# Patient Record
Sex: Female | Born: 1950 | Race: Black or African American | Hispanic: No | Marital: Single | Smoking: Former smoker
Health system: Southern US, Community
[De-identification: ages and names within clinical notes are randomized; demographics above are authoritative.]

## PROBLEM LIST (undated history)

## (undated) DIAGNOSIS — I10 Essential (primary) hypertension: Secondary | ICD-10-CM

## (undated) DIAGNOSIS — M109 Gout, unspecified: Secondary | ICD-10-CM

## (undated) DIAGNOSIS — J302 Other seasonal allergic rhinitis: Secondary | ICD-10-CM

## (undated) DIAGNOSIS — D649 Anemia, unspecified: Secondary | ICD-10-CM

## (undated) DIAGNOSIS — I700 Atherosclerosis of aorta: Secondary | ICD-10-CM

## (undated) DIAGNOSIS — K219 Gastro-esophageal reflux disease without esophagitis: Secondary | ICD-10-CM

## (undated) DIAGNOSIS — M1990 Unspecified osteoarthritis, unspecified site: Secondary | ICD-10-CM

## (undated) DIAGNOSIS — Z992 Dependence on renal dialysis: Secondary | ICD-10-CM

## (undated) DIAGNOSIS — J189 Pneumonia, unspecified organism: Secondary | ICD-10-CM

## (undated) DIAGNOSIS — R011 Cardiac murmur, unspecified: Secondary | ICD-10-CM

## (undated) DIAGNOSIS — I2510 Atherosclerotic heart disease of native coronary artery without angina pectoris: Secondary | ICD-10-CM

## (undated) DIAGNOSIS — N186 End stage renal disease: Secondary | ICD-10-CM

## (undated) DIAGNOSIS — G709 Myoneural disorder, unspecified: Secondary | ICD-10-CM

## (undated) DIAGNOSIS — N289 Disorder of kidney and ureter, unspecified: Secondary | ICD-10-CM

## (undated) DIAGNOSIS — I4891 Unspecified atrial fibrillation: Secondary | ICD-10-CM

## (undated) DIAGNOSIS — E213 Hyperparathyroidism, unspecified: Secondary | ICD-10-CM

## (undated) HISTORY — DX: End stage renal disease: N18.6

## (undated) HISTORY — DX: Unspecified atrial fibrillation: I48.91

## (undated) HISTORY — DX: Other seasonal allergic rhinitis: J30.2

## (undated) HISTORY — DX: Atherosclerosis of aorta: I70.0

## (undated) HISTORY — PX: DIALYSIS FISTULA CREATION: SHX611

## (undated) HISTORY — DX: Cardiac murmur, unspecified: R01.1

## (undated) HISTORY — DX: Hyperparathyroidism, unspecified: E21.3

## (undated) HISTORY — DX: Atherosclerotic heart disease of native coronary artery without angina pectoris: I25.10

## (undated) HISTORY — PX: TUBAL LIGATION: SHX77

---

## 2019-03-31 DIAGNOSIS — E079 Disorder of thyroid, unspecified: Secondary | ICD-10-CM | POA: Insufficient documentation

## 2019-03-31 DIAGNOSIS — R0602 Shortness of breath: Secondary | ICD-10-CM | POA: Insufficient documentation

## 2019-03-31 DIAGNOSIS — E878 Other disorders of electrolyte and fluid balance, not elsewhere classified: Secondary | ICD-10-CM | POA: Insufficient documentation

## 2019-03-31 DIAGNOSIS — D069 Carcinoma in situ of cervix, unspecified: Secondary | ICD-10-CM | POA: Insufficient documentation

## 2019-03-31 DIAGNOSIS — N250 Renal osteodystrophy: Secondary | ICD-10-CM | POA: Insufficient documentation

## 2019-03-31 DIAGNOSIS — K30 Functional dyspepsia: Secondary | ICD-10-CM | POA: Insufficient documentation

## 2019-03-31 DIAGNOSIS — D631 Anemia in chronic kidney disease: Secondary | ICD-10-CM | POA: Insufficient documentation

## 2019-03-31 DIAGNOSIS — Q612 Polycystic kidney, adult type: Secondary | ICD-10-CM | POA: Insufficient documentation

## 2019-03-31 DIAGNOSIS — D689 Coagulation defect, unspecified: Secondary | ICD-10-CM | POA: Insufficient documentation

## 2019-03-31 DIAGNOSIS — D509 Iron deficiency anemia, unspecified: Secondary | ICD-10-CM | POA: Insufficient documentation

## 2019-03-31 DIAGNOSIS — M109 Gout, unspecified: Secondary | ICD-10-CM | POA: Insufficient documentation

## 2019-05-09 DIAGNOSIS — N2581 Secondary hyperparathyroidism of renal origin: Secondary | ICD-10-CM | POA: Insufficient documentation

## 2019-05-15 DIAGNOSIS — U071 COVID-19: Secondary | ICD-10-CM

## 2019-05-15 HISTORY — DX: COVID-19: U07.1

## 2019-08-27 DIAGNOSIS — R52 Pain, unspecified: Secondary | ICD-10-CM | POA: Insufficient documentation

## 2019-11-13 DIAGNOSIS — T782XXA Anaphylactic shock, unspecified, initial encounter: Secondary | ICD-10-CM | POA: Insufficient documentation

## 2019-11-13 DIAGNOSIS — T7840XA Allergy, unspecified, initial encounter: Secondary | ICD-10-CM | POA: Insufficient documentation

## 2019-11-18 DIAGNOSIS — E8809 Other disorders of plasma-protein metabolism, not elsewhere classified: Secondary | ICD-10-CM | POA: Insufficient documentation

## 2020-01-12 DIAGNOSIS — E8770 Fluid overload, unspecified: Secondary | ICD-10-CM | POA: Insufficient documentation

## 2020-02-11 DIAGNOSIS — Z992 Dependence on renal dialysis: Secondary | ICD-10-CM | POA: Insufficient documentation

## 2020-12-29 ENCOUNTER — Encounter (HOSPITAL_BASED_OUTPATIENT_CLINIC_OR_DEPARTMENT_OTHER): Payer: Self-pay | Admitting: Emergency Medicine

## 2020-12-29 ENCOUNTER — Other Ambulatory Visit: Payer: Self-pay

## 2020-12-29 ENCOUNTER — Emergency Department (HOSPITAL_BASED_OUTPATIENT_CLINIC_OR_DEPARTMENT_OTHER): Payer: Medicare Other

## 2020-12-29 ENCOUNTER — Emergency Department (HOSPITAL_BASED_OUTPATIENT_CLINIC_OR_DEPARTMENT_OTHER)
Admission: EM | Admit: 2020-12-29 | Discharge: 2020-12-29 | Disposition: A | Discharge status: Home / Self Care | Payer: Medicare Other | Attending: Emergency Medicine | Admitting: Emergency Medicine

## 2020-12-29 DIAGNOSIS — M79674 Pain in right toe(s): Secondary | ICD-10-CM | POA: Diagnosis not present

## 2020-12-29 HISTORY — DX: Dependence on renal dialysis: Z99.2

## 2020-12-29 IMAGING — DX DG FOOT COMPLETE 3+V*R*
2 series · 3 of 3 positions shown · non-contrast
Comparison: None.

CLINICAL DATA: Great toe pain.  No known injury.

EXAM:
RIGHT FOOT COMPLETE - 3+ VIEW

[Series 1: foot · 0.14mm/px · 2 of 2 slices shown]
[im 1/2]
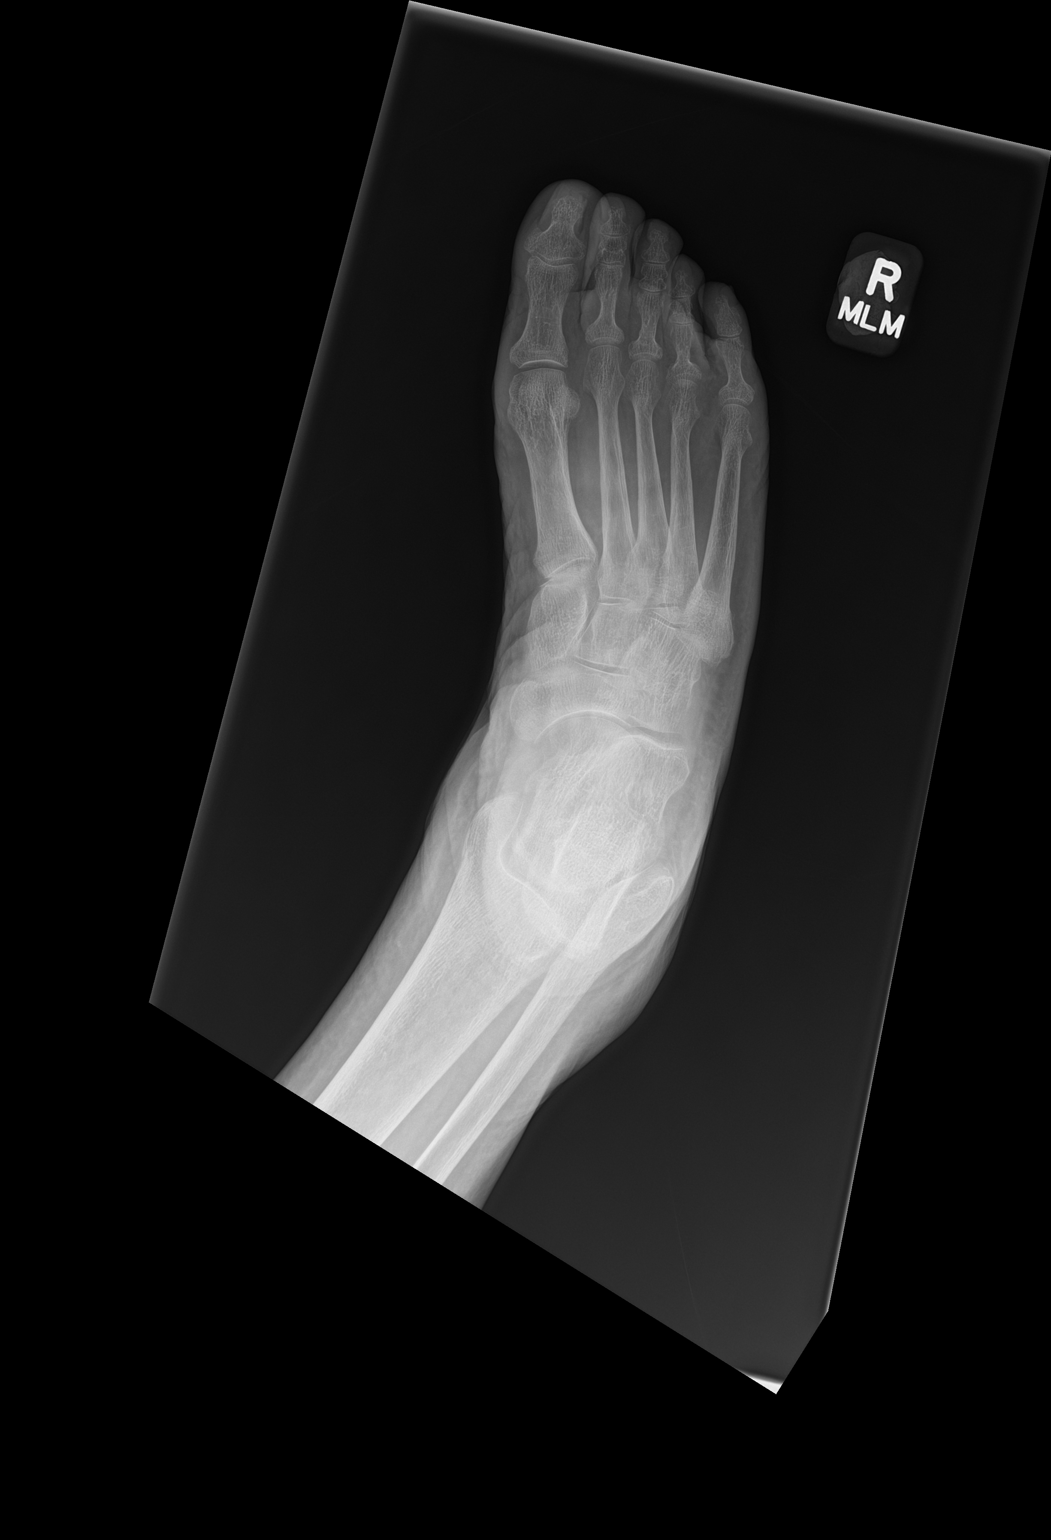
[im 2/2]
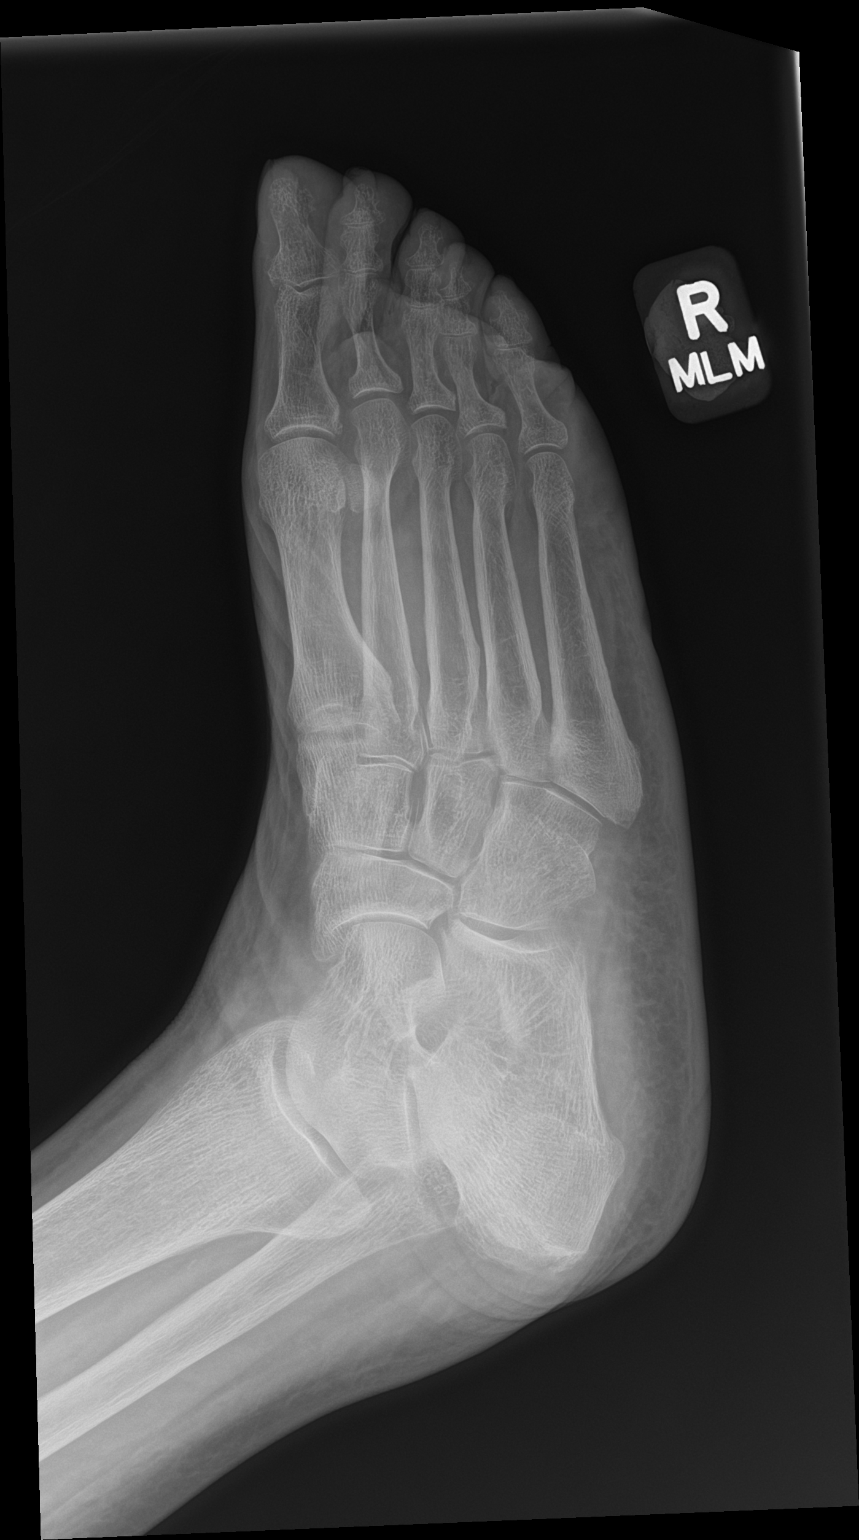

[leg]
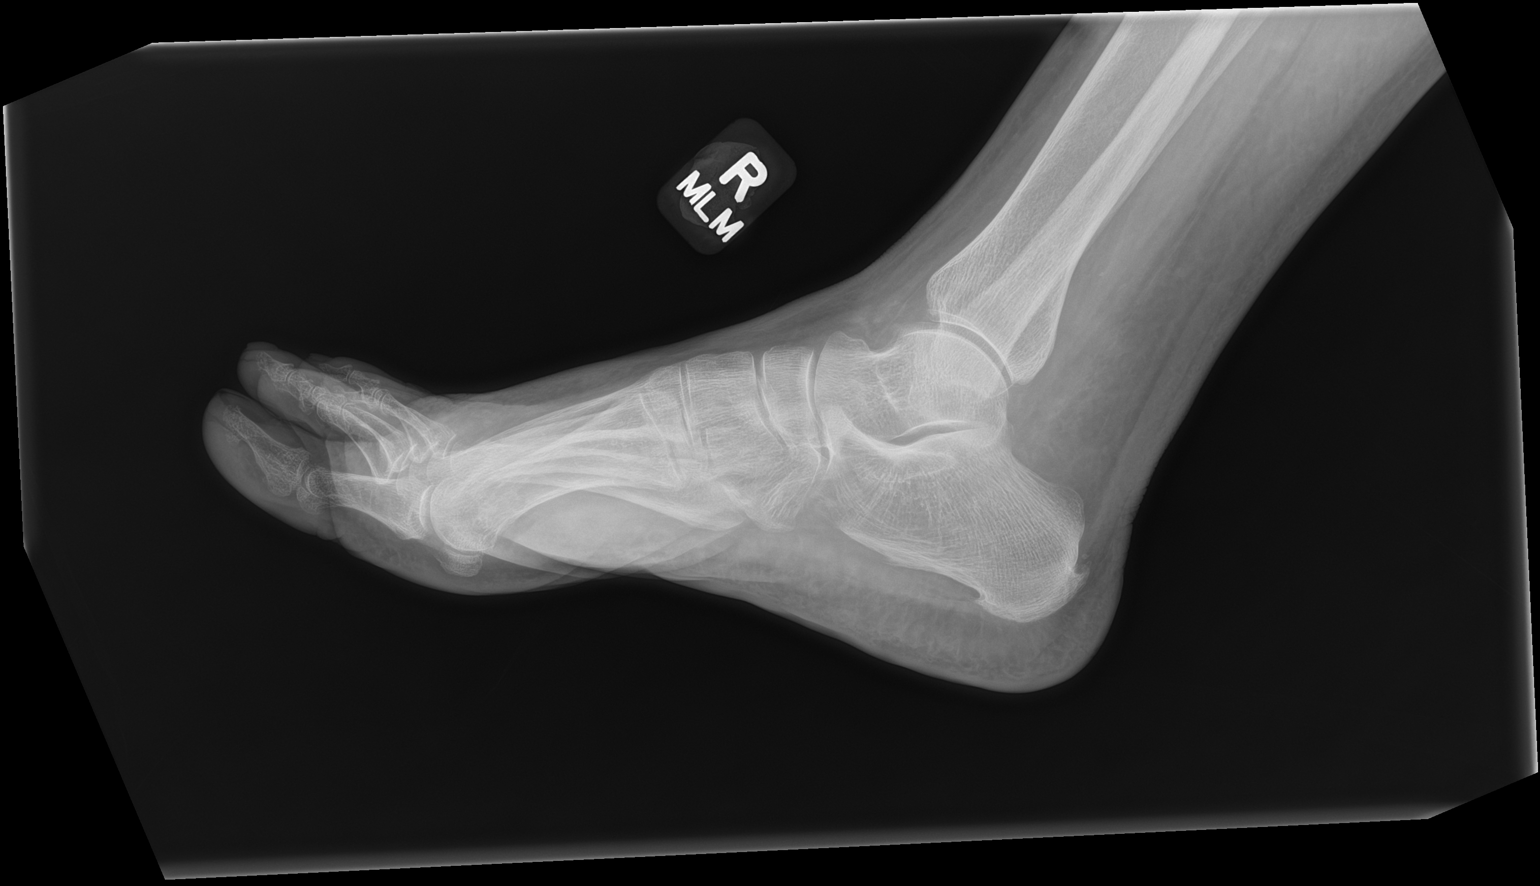

[3 of 3 positions shown; findings below may reference images not displayed]

FINDINGS: Bones are mildly demineralized. There is no evidence of fracture or
dislocation. Small bidirectional calcaneal enthesophytes. There is
no evidence of arthropathy or other focal bone abnormality. Soft
tissues are unremarkable.
IMPRESSION: Negative.

## 2020-12-29 MED ORDER — PREDNISONE 20 MG PO TABS: 40.0000 mg | ORAL_TABLET | Freq: Every day | ORAL | 0 refills | Status: DC

## 2020-12-29 NOTE — ED Provider Notes (Signed)
[place] EMERGENCY DEPT Provider Note   CSN: [id number] Arrival date & time: 12/29/20  1121     History Chief Complaint  Patient presents with   Joint Swelling    Gloria Gallagher is a 70 y.o. female.  She is here with complaint of pain and swelling to her right great toe.  Its been going on a week but today she said it actually feels a little bit better.  She does not recall any trauma.  No prior history of gout.  She is end-stage renal disease on dialysis.  No diabetes.  The history is provided by the patient.  Toe Pain This is a new problem. The current episode started more than 1 week ago. The problem occurs constantly. The problem has been gradually improving. Pertinent negatives include no chest pain, no abdominal pain, no headaches and no shortness of breath. The symptoms are aggravated by bending and walking. Nothing relieves the symptoms. She has tried rest for the symptoms. The treatment provided mild relief.      Past Medical History:  Diagnosis Date   Dialysis patient [facility])     There are no problems to display for this patient.   History reviewed. No pertinent surgical history.   OB History   No obstetric history on file.     History reviewed. No pertinent family history.  Social History   Tobacco Use   Smoking status: Never   Smokeless tobacco: Never  Vaping Use   Vaping Use: Never used  Substance Use Topics   Alcohol use: Never   Drug use: Never    Home Medications Prior to Admission medications   Not on File    Allergies    Penicillin g  Review of Systems   Review of Systems  Constitutional:  Negative for fever.  Respiratory:  Negative for shortness of breath.   Cardiovascular:  Negative for chest pain.  Gastrointestinal:  Negative for abdominal pain.  Skin:  Negative for wound.  Neurological:  Negative for headaches.   Physical Exam Updated Vital Signs BP 105/73 (BP Location: Right Arm)   Pulse 82   Temp 98.3 F  (36.8 C) (Oral)   Resp 18   Ht '5\' 5"'$  (1.651 m)   Wt 77.1 kg   SpO2 96%   BMI 28.29 kg/m   Physical Exam Constitutional:      Appearance: Normal appearance. She is well-developed.  HENT:     Head: Normocephalic and atraumatic.  Eyes:     Conjunctiva/sclera: Conjunctivae normal.  Musculoskeletal:        General: Tenderness present.     Cervical back: Neck supple.     Comments: She is some tenderness and redness at the base of her great toe on the right foot.  Minimal swelling.  No open wounds.  Cap refill brisk.  Ankle nontender.  No cords appreciated  Skin:    General: Skin is warm and dry.  Neurological:     General: No focal deficit present.     Mental Status: She is alert.     GCS: GCS eye subscore is 4. GCS verbal subscore is 5. GCS motor subscore is 6.    ED Results / Procedures / Treatments   Labs (all labs ordered are listed, but only abnormal results are displayed) Labs Reviewed - No data to display  EKG None  Radiology DG Foot Complete Right  Result Date: 12/29/2020 CLINICAL DATA:  Great toe pain.  No known injury. EXAM: RIGHT FOOT COMPLETE -  3+ VIEW COMPARISON:  None. FINDINGS: Bones are mildly demineralized. There is no evidence of fracture or dislocation. Small bidirectional calcaneal enthesophytes. There is no evidence of arthropathy or other focal bone abnormality. Soft tissues are unremarkable. IMPRESSION: Negative. Electronically Signed   By: Davina Poke D.O.   On: 12/29/2020 12:16    Procedures Procedures   Medications Ordered in ED Medications - No data to display  ED Course  I have reviewed the triage vital signs and the nursing notes.  Pertinent labs & imaging results that were available during my care of the patient were reviewed by me and considered in my medical decision making (see chart for details).  Clinical Course as of 12/29/20 1736  Thu Dec 29, 2020  1204 X-ray of right foot does not show any acute fractures or destructive bony  lesions.  Awaiting radiology reading. [MB]    Clinical Course User Index [MB] Hayden Rasmussen, MD   MDM Rules/Calculators/A&P                           70 year old female end-stage renal disease here with atraumatic right great toe pain.  Has minimal tenderness and swelling on exam.  No indication that infected.  No open wounds.  X-rays do not show any acute fracture.  Not candidate for NSAIDs with her kidney disease.  We will provide short course of steroids for likely gout flare.  Recommended close PCP follow-up.  Return instructions discussed Final Clinical Impression(s) / ED Diagnoses Final diagnoses:  Great toe pain, right    Rx / DC Orders ED Discharge Orders          Ordered    predniSONE (DELTASONE) 20 MG tablet  Daily        12/29/20 1220             Hayden Rasmussen, MD 12/29/20 1737

## 2020-12-29 NOTE — Discharge Instructions (Addendum)
You are seen in the emergency department for evaluation of right great toe pain.  You had an x-ray done that did not show any obvious fracture.  This is most likely gout.  It should respond to prednisone which is a steroid.  Please follow-up with your regular doctor.  Return to the emergency department for any worsening or concerning symptoms.

## 2020-12-29 NOTE — ED Notes (Signed)
Dc instructions reviewed with patient and voiced understanding of dc instructions and prescription

## 2020-12-29 NOTE — ED Triage Notes (Signed)
Swelling to right ankle.  Reports painful at night.

## 2021-02-02 ENCOUNTER — Emergency Department (HOSPITAL_BASED_OUTPATIENT_CLINIC_OR_DEPARTMENT_OTHER): Payer: Medicare Other | Admitting: Radiology

## 2021-02-02 ENCOUNTER — Emergency Department (HOSPITAL_BASED_OUTPATIENT_CLINIC_OR_DEPARTMENT_OTHER)
Admission: EM | Admit: 2021-02-02 | Discharge: 2021-02-02 | Disposition: A | Discharge status: Home / Self Care | Payer: Medicare Other | Attending: Emergency Medicine | Admitting: Emergency Medicine

## 2021-02-02 ENCOUNTER — Other Ambulatory Visit: Payer: Self-pay

## 2021-02-02 ENCOUNTER — Emergency Department (HOSPITAL_BASED_OUTPATIENT_CLINIC_OR_DEPARTMENT_OTHER): Payer: Medicare Other

## 2021-02-02 ENCOUNTER — Encounter (HOSPITAL_BASED_OUTPATIENT_CLINIC_OR_DEPARTMENT_OTHER): Payer: Self-pay | Admitting: Obstetrics and Gynecology

## 2021-02-02 DIAGNOSIS — Z992 Dependence on renal dialysis: Secondary | ICD-10-CM | POA: Diagnosis not present

## 2021-02-02 DIAGNOSIS — M79671 Pain in right foot: Secondary | ICD-10-CM | POA: Diagnosis not present

## 2021-02-02 HISTORY — DX: Disorder of kidney and ureter, unspecified: N28.9

## 2021-02-02 HISTORY — DX: Gout, unspecified: M10.9

## 2021-02-02 HISTORY — DX: Unspecified osteoarthritis, unspecified site: M19.90

## 2021-02-02 IMAGING — US US EXTREM LOW VENOUS*R*
1 series · 13 of 24 positions shown · non-contrast
Comparison: None.

CLINICAL DATA: right foot pain with right lower leg swelling



[Series 1: venous le · portal-venous · 13 of 53 slices shown]
[im 1/53]
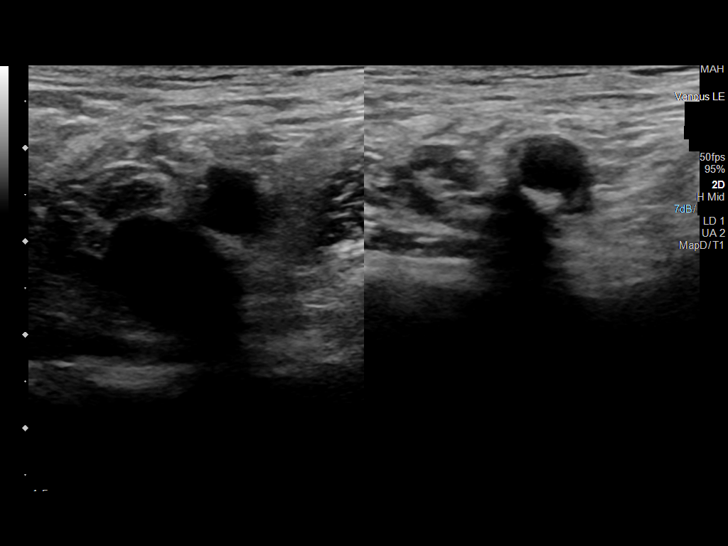
[im 5/53]
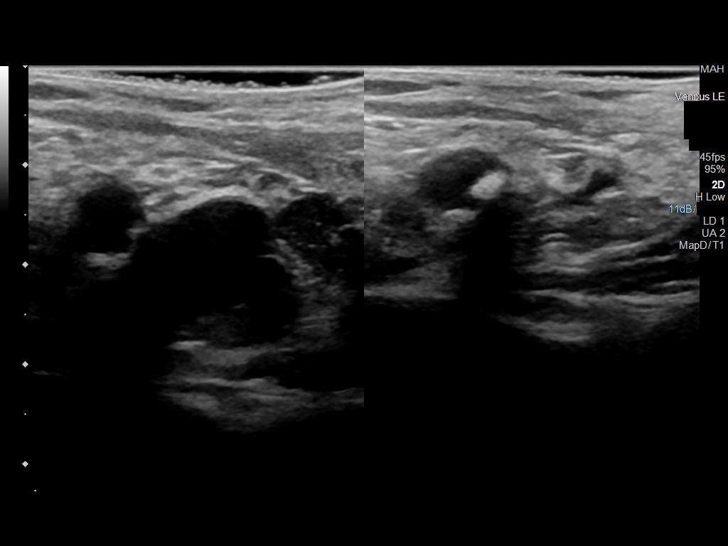
[im 10/53]
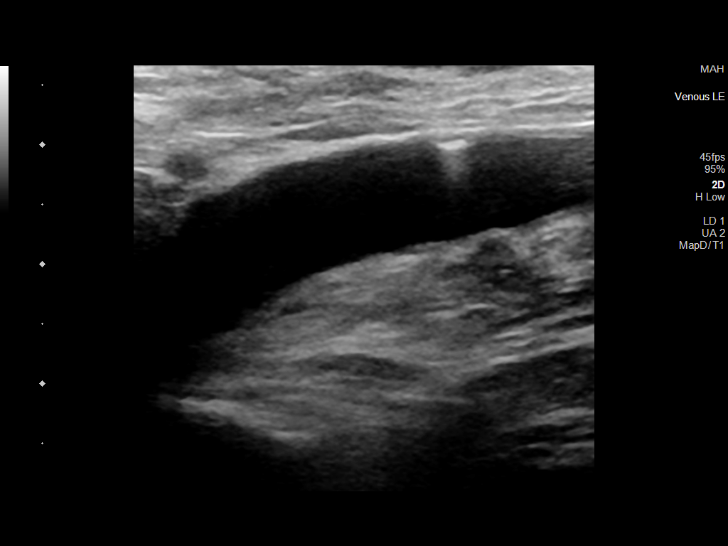
[im 14/53]
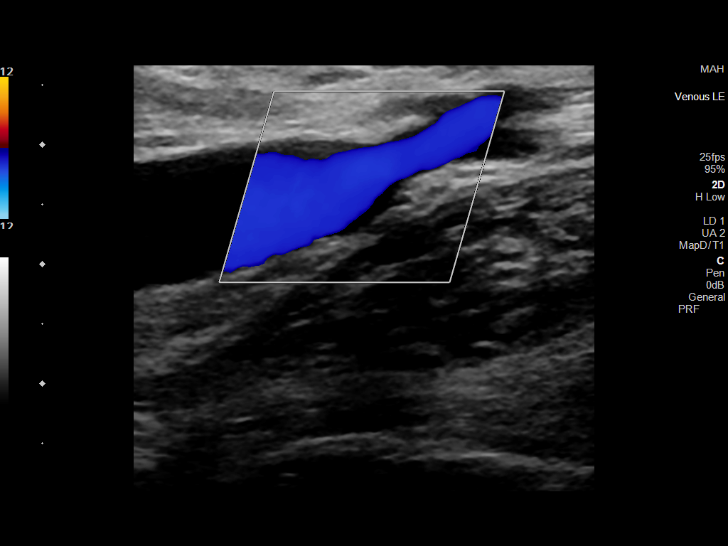
[im 19/53]
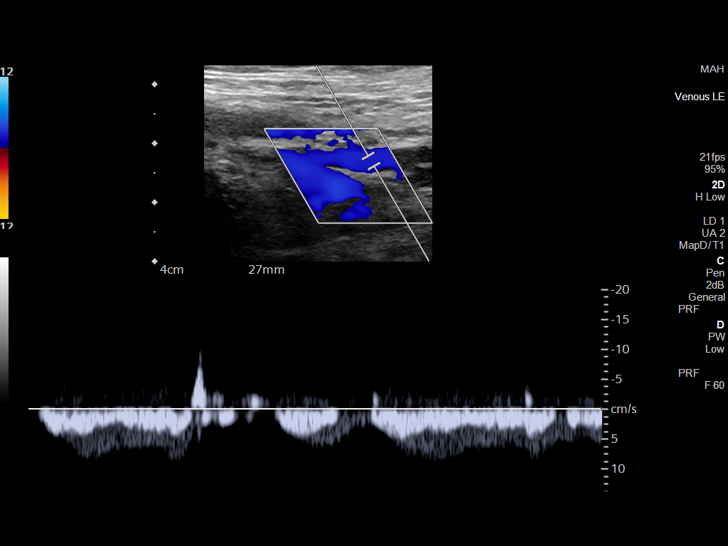
[im 23/53]
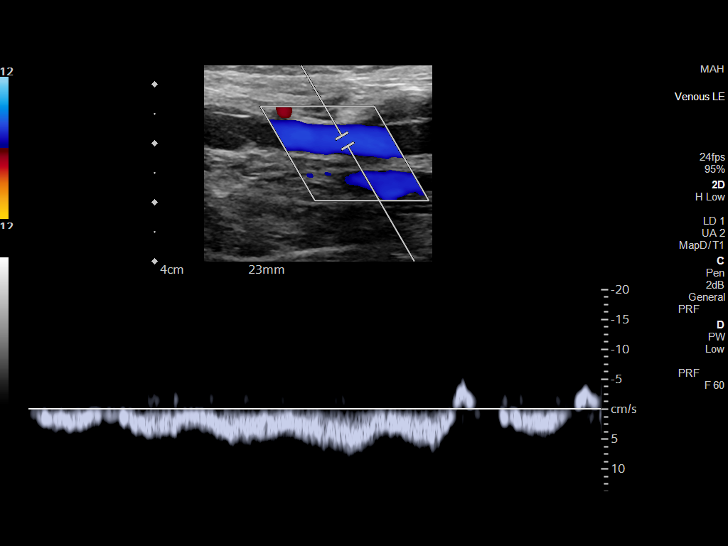
[im 28/53]
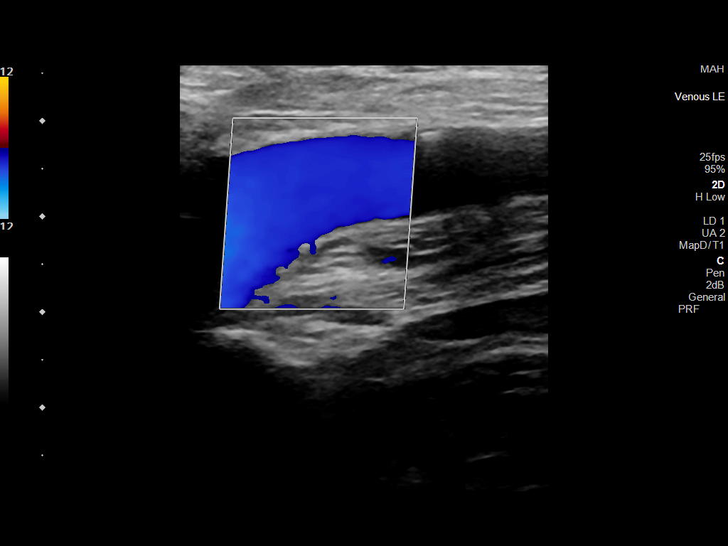
[im 30/53]
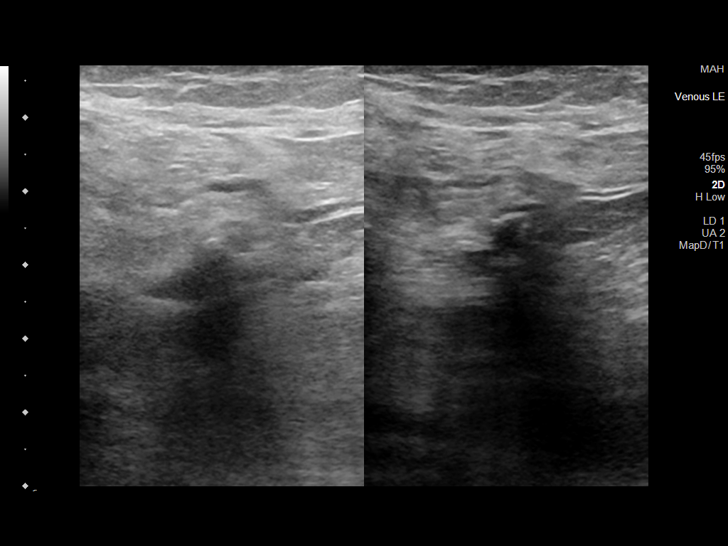
[im 34/53]
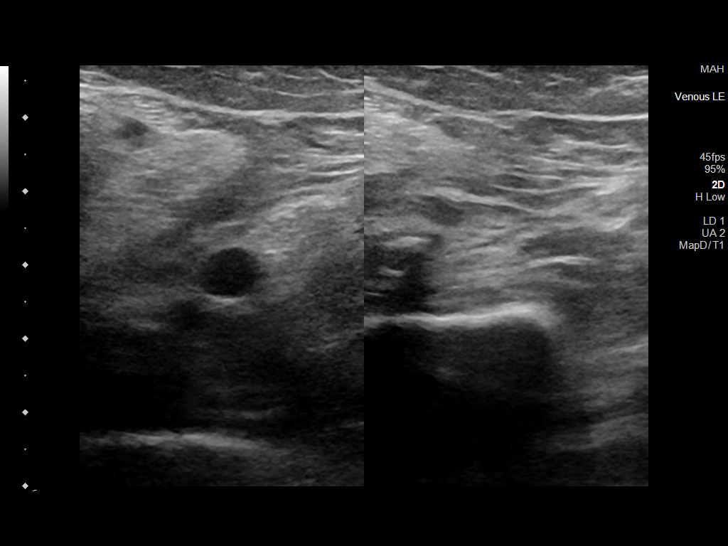
[im 39/53]
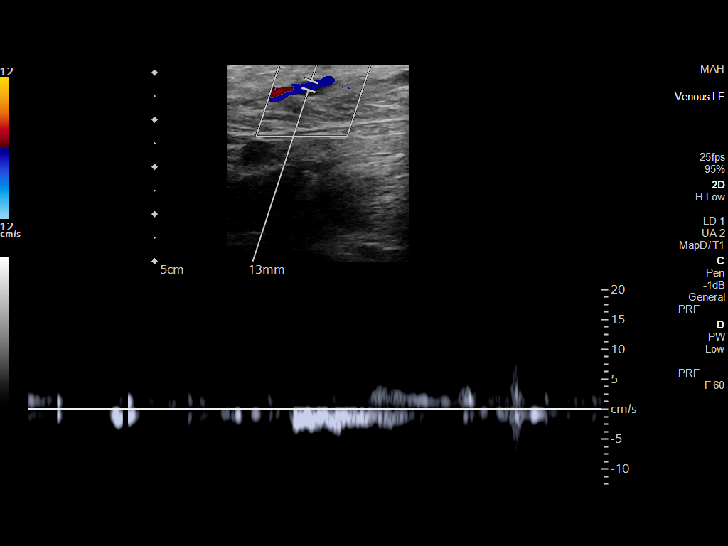
[im 43/53]
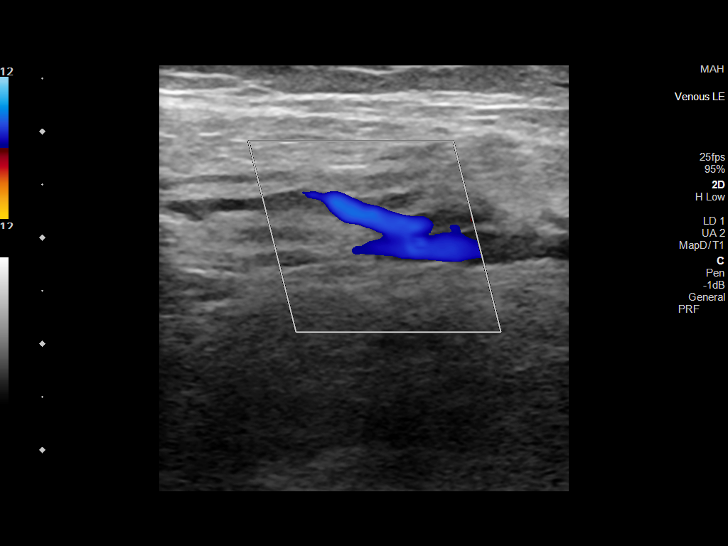
[im 48/53]
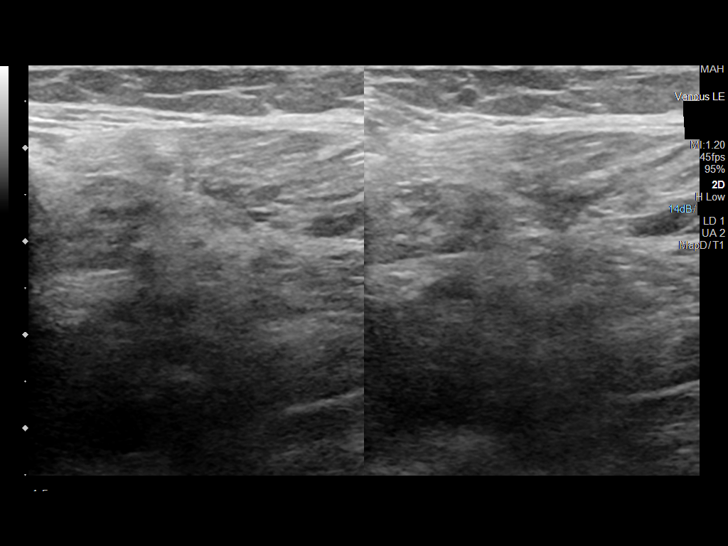
[im 53/53]
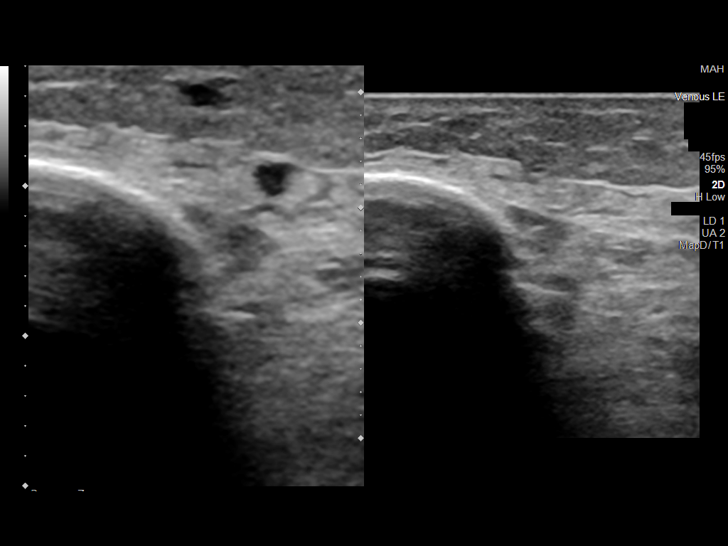

[13 of 24 positions shown; findings below may reference images not displayed]

FINDINGS: Contralateral Common Femoral Vein: Respiratory phasicity is normal
and symmetric with the symptomatic side. No evidence of thrombus.
Normal compressibility.

Common Femoral Vein: In the right common femoral vein, there is a
small, nonocclusive echogenic wall adherent filling defect. The vein
is otherwise widely patent.

Saphenofemoral Junction: No evidence of thrombus. Normal
compressibility and flow on color Doppler imaging.

Profunda Femoral Vein: No evidence of thrombus. Normal
compressibility and flow on color Doppler imaging.

Femoral Vein: No evidence of thrombus. Normal compressibility,
respiratory phasicity and response to augmentation.

Popliteal Vein: No evidence of thrombus. Normal compressibility,
respiratory phasicity and response to augmentation.

Calf Veins: No evidence of thrombus. Normal compressibility and flow
on color Doppler imaging.

Superficial Great Saphenous Vein: No evidence of thrombus. Normal
compressibility.

Other Findings:  None.
IMPRESSION: 1. In the right common femoral vein, there is a small, nonocclusive
echogenic filling defect that could represent synechiae/sequela of
chronic DVT. This does not appear flow limiting. The right lower
extremity veins are otherwise widely patent without findings of
acute DVT.

## 2021-02-02 IMAGING — DX DG FOOT COMPLETE 3+V*R*
3 series · 3 of 3 positions shown · non-contrast
Comparison: None.

CLINICAL DATA: Foot pain

EXAM:
RIGHT FOOT COMPLETE - 3 VIEW

[foot ap]
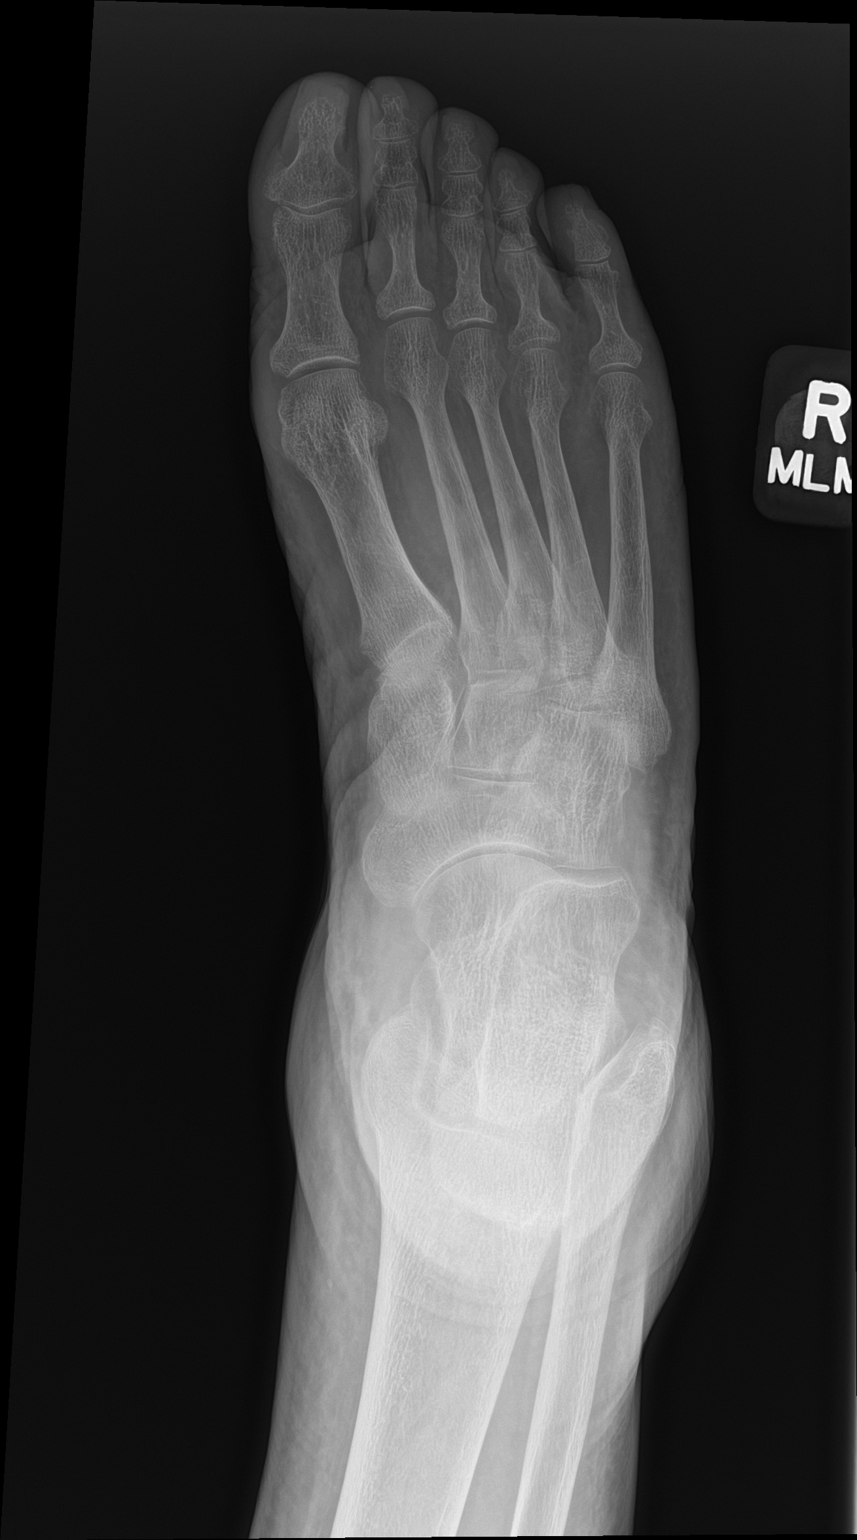

[foot obl]
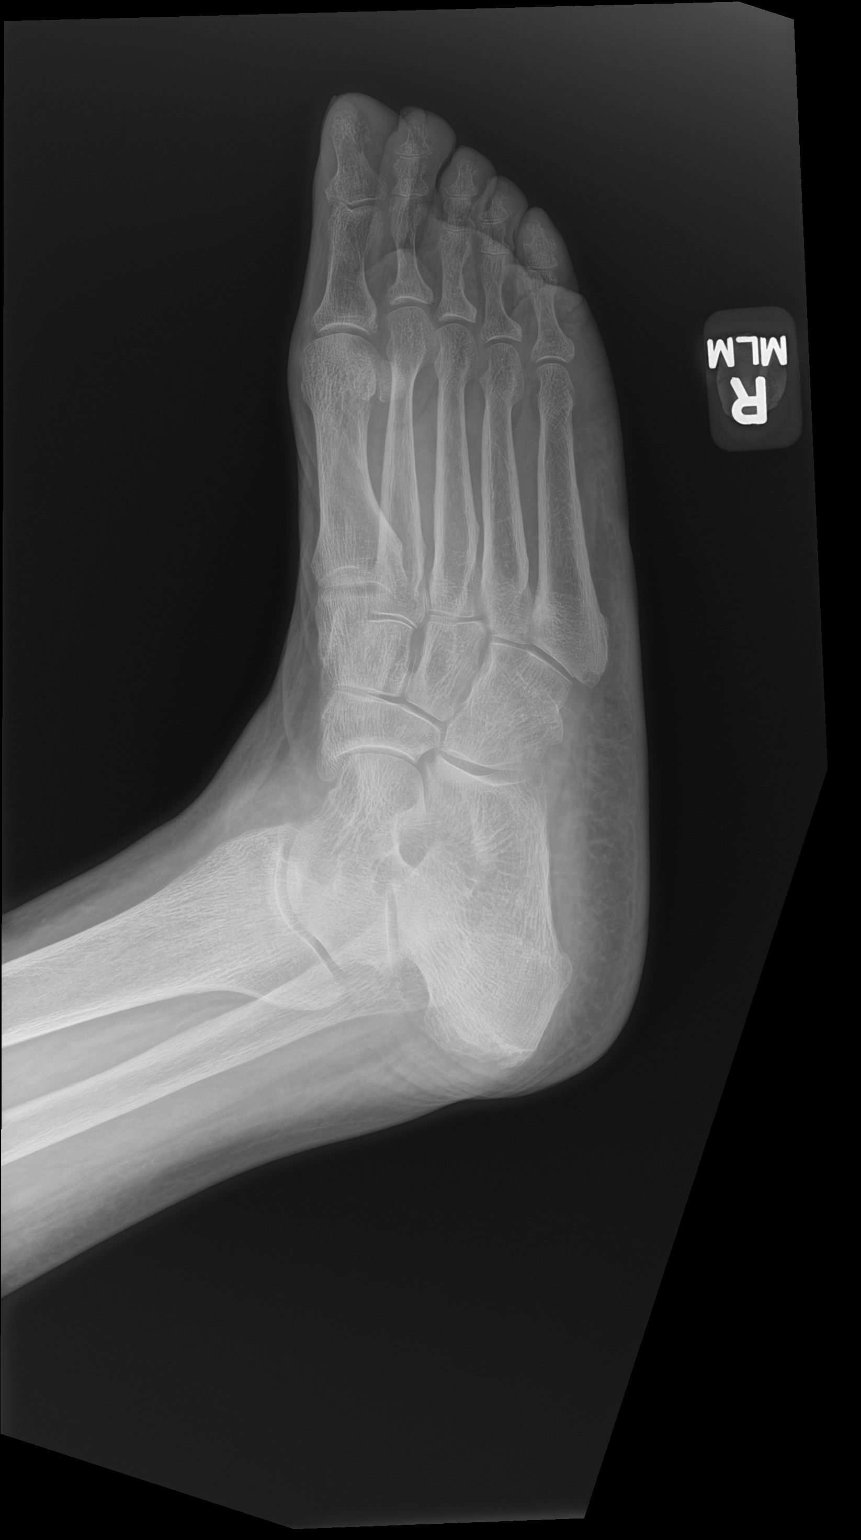

[foot lat]
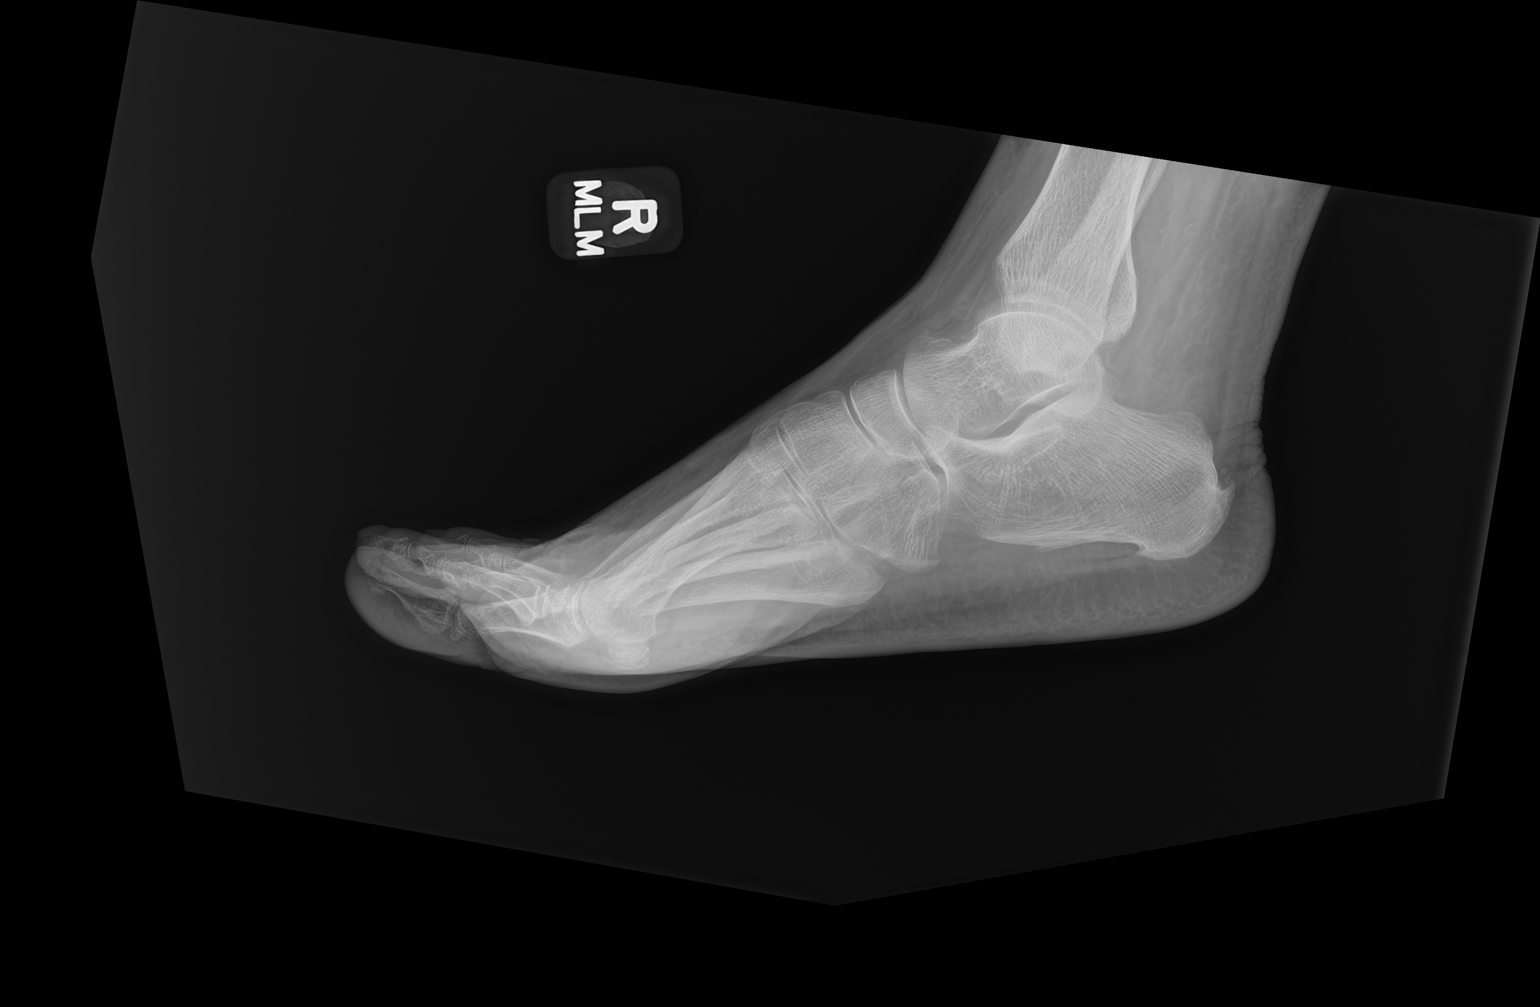

[3 of 3 positions shown; findings below may reference images not displayed]

FINDINGS: Normal alignment. No acute fracture. Mild calcific tendinosis of the
Achilles tendon. Osteopenia. The soft tissues are unremarkable. No
ankle joint effusion.
IMPRESSION: No malalignment or acute fracture.  Osteopenia.

## 2021-02-02 NOTE — ED Provider Notes (Signed)
[place] EMERGENCY DEPT Provider Note   CSN: [id number] Arrival date & time: 02/02/21  1015     History Chief Complaint  Patient presents with   Foot Pain    Gloria Gallagher is a 70 y.o. female.  Pt is a 70 yo female presenting for foot pain. Pt admits to burning sensation on top and bottom of right foot. Denies falls in injuries. Denies hx of DM or neuropathy. Admits to previous dx of gout but had pain in great toe joint at that time. Denies any skin discoloration or rashes.   The history is provided by the patient. No language interpreter was used.  Foot Pain Pertinent negatives include no chest pain, no abdominal pain and no shortness of breath.      Past Medical History:  Diagnosis Date   Arthritis    Dialysis patient [facility])    Gout    Renal disorder     There are no problems to display for this patient.   History reviewed. No pertinent surgical history.   OB History     Gravida      Para      Term      Preterm      AB      Living  1      SAB      IAB      Ectopic      Multiple      Live Births              No family history on file.  Social History   Tobacco Use   Smoking status: Never    Passive exposure: Never   Smokeless tobacco: Never  Vaping Use   Vaping Use: Never used  Substance Use Topics   Alcohol use: Never   Drug use: Never    Home Medications Prior to Admission medications   Medication Sig Start Date End Date Taking? Authorizing Provider  predniSONE (DELTASONE) 20 MG tablet Take 2 tablets (40 mg total) by mouth daily. 12/29/20   Hayden Rasmussen, MD    Allergies    Penicillin g  Review of Systems   Review of Systems  Constitutional:  Negative for chills and fever.  HENT:  Negative for ear pain and sore throat.   Eyes:  Negative for pain and visual disturbance.  Respiratory:  Negative for cough and shortness of breath.   Cardiovascular:  Negative for chest pain and palpitations.   Gastrointestinal:  Negative for abdominal pain and vomiting.  Genitourinary:  Negative for dysuria and hematuria.  Musculoskeletal:  Negative for arthralgias and back pain.  Skin:  Negative for color change and rash.  Neurological:  Negative for seizures and syncope.  All other systems reviewed and are negative.  Physical Exam Updated Vital Signs BP 119/78 (BP Location: Right Arm)   Pulse 81   Temp 98.3 F (36.8 C)   Resp 16   SpO2 98%   Physical Exam Vitals and nursing note reviewed.  Constitutional:      Appearance: Normal appearance.  HENT:     Head: Normocephalic and atraumatic.  Cardiovascular:     Rate and Rhythm: Normal rate and regular rhythm.     Pulses:          Dorsalis pedis pulses are 2+ on the right side and 2+ on the left side.  Pulmonary:     Effort: Pulmonary effort is normal. No respiratory distress.  Musculoskeletal:       Feet:  Feet:     Comments: No rashes, skin discoloration, or reproducible tenderness. No wounds.  Skin:    Capillary Refill: Capillary refill takes less than 2 seconds.  Neurological:     General: No focal deficit present.     Mental Status: She is alert.     GCS: GCS eye subscore is 4. GCS verbal subscore is 5. GCS motor subscore is 6.     Sensory: Sensation is intact.     Motor: Motor function is intact.    ED Results / Procedures / Treatments   Labs (all labs ordered are listed, but only abnormal results are displayed) Labs Reviewed - No data to display  EKG None  Radiology DG Foot Complete Right  Result Date: 02/02/2021 CLINICAL DATA:  Foot pain EXAM: RIGHT FOOT COMPLETE - 3 VIEW COMPARISON:  None. FINDINGS: Normal alignment. No acute fracture. Mild calcific tendinosis of the Achilles tendon. Osteopenia. The soft tissues are unremarkable. No ankle joint effusion. IMPRESSION: No malalignment or acute fracture.  Osteopenia. Electronically Signed   By: Albin Felling M.D.   On: 02/02/2021 11:31    Procedures Procedures    Medications Ordered in ED Medications - No data to display  ED Course  I have reviewed the triage vital signs and the nursing notes.  Pertinent labs & imaging results that were available during my care of the patient were reviewed by me and considered in my medical decision making (see chart for details).    MDM Rules/Calculators/A&P                          11:38 AM 70 yo female presenting for foot pain. Pt Aox3, no acute distress, afebrile, with stable vitals. Physical exam demonstrates No rashes, skin discoloration, or reproducible tenderness. No wounds. Xray demonstrates no acute process. No fractures. Pt has swelling of the right ankles worse when compared to the left. Venous doppler demonstrates:  1. In the right common femoral vein, there is a small, nonocclusive echogenic filling defect that could represent synechiae/sequela of chronic DVT. This does not appear flow limiting. The right lower extremity veins are otherwise widely patent without findings of acute DVT.  Patient in no distress and overall condition improved here in the ED. Detailed discussions were had with the patient regarding current findings, and need for close f/u with PCP or on call doctor. The patient has been instructed to return immediately if the symptoms worsen in any way for re-evaluation. Patient verbalized understanding and is in agreement with current care plan. All questions answered prior to discharge.   Final Clinical Impression(s) / ED Diagnoses Final diagnoses:  Right foot pain    Rx / DC Orders ED Discharge Orders     None        Lianne Cure, DO [id number]

## 2021-02-02 NOTE — ED Triage Notes (Signed)
Patient reports right foot pain x2 weeks. Patient states she was diagnosed with Gout but is still having pain.

## 2021-02-06 ENCOUNTER — Emergency Department (HOSPITAL_BASED_OUTPATIENT_CLINIC_OR_DEPARTMENT_OTHER)
Admission: EM | Admit: 2021-02-06 | Discharge: 2021-02-06 | Disposition: A | Discharge status: Home / Self Care | Payer: Medicare Other | Attending: Emergency Medicine | Admitting: Emergency Medicine

## 2021-02-06 ENCOUNTER — Encounter (HOSPITAL_BASED_OUTPATIENT_CLINIC_OR_DEPARTMENT_OTHER): Payer: Self-pay | Admitting: *Deleted

## 2021-02-06 ENCOUNTER — Other Ambulatory Visit: Payer: Self-pay

## 2021-02-06 DIAGNOSIS — R2241 Localized swelling, mass and lump, right lower limb: Secondary | ICD-10-CM | POA: Insufficient documentation

## 2021-02-06 DIAGNOSIS — M79671 Pain in right foot: Secondary | ICD-10-CM | POA: Insufficient documentation

## 2021-02-06 LAB — COMPREHENSIVE METABOLIC PANEL
ALT: 9 U/L (ref 0–44)
AST: 14 U/L — ABNORMAL LOW (ref 15–41)
Albumin: 4.2 g/dL (ref 3.5–5.0)
Alkaline Phosphatase: 158 U/L — ABNORMAL HIGH (ref 38–126)
Anion gap: 10 (ref 5–15)
BUN: 42 mg/dL — ABNORMAL HIGH (ref 8–23)
CO2: 34 mmol/L — ABNORMAL HIGH (ref 22–32)
Calcium: 10.3 mg/dL (ref 8.9–10.3)
Chloride: 91 mmol/L — ABNORMAL LOW (ref 98–111)
Creatinine, Ser: 6.9 mg/dL — ABNORMAL HIGH (ref 0.44–1.00)
GFR, Estimated: 6 mL/min — ABNORMAL LOW (ref >60)
Glucose, Bld: 92 mg/dL (ref 70–99)
Potassium: 3.8 mmol/L (ref 3.5–5.1)
Sodium: 135 mmol/L (ref 135–145)
Total Bilirubin: 0.4 mg/dL (ref 0.3–1.2)
Total Protein: 6.8 g/dL (ref 6.5–8.1)

## 2021-02-06 LAB — CBC WITH DIFFERENTIAL/PLATELET
Abs Immature Granulocytes: 0.01 10*3/uL (ref 0.00–0.07)
Basophils Absolute: 0 10*3/uL (ref 0.0–0.1)
Basophils Relative: 1 %
Eosinophils Absolute: 0 10*3/uL (ref 0.0–0.5)
Eosinophils Relative: 1 %
HCT: 36.2 % (ref 36.0–46.0)
Hemoglobin: 11.7 g/dL — ABNORMAL LOW (ref 12.0–15.0)
Immature Granulocytes: 0 %
Lymphocytes Relative: 23 %
Lymphs Abs: 1.1 10*3/uL (ref 0.7–4.0)
MCH: 31.3 pg (ref 26.0–34.0)
MCHC: 32.3 g/dL (ref 30.0–36.0)
MCV: 96.8 fL (ref 80.0–100.0)
Monocytes Absolute: 0.4 10*3/uL (ref 0.1–1.0)
Monocytes Relative: 9 %
Neutro Abs: 3.3 10*3/uL (ref 1.7–7.7)
Neutrophils Relative %: 66 %
Platelets: 165 10*3/uL (ref 150–400)
RBC: 3.74 MIL/uL — ABNORMAL LOW (ref 3.87–5.11)
RDW: 13.6 % (ref 11.5–15.5)
WBC: 4.9 10*3/uL (ref 4.0–10.5)
nRBC: 0 % (ref 0.0–0.2)

## 2021-02-06 MED ORDER — GABAPENTIN 100 MG PO CAPS: 100.0000 mg | ORAL_CAPSULE | Freq: Every day | ORAL | 0 refills | Status: DC

## 2021-02-06 NOTE — ED Triage Notes (Signed)
Pt was here on the 22nd with complaint of right foot pain.  Came back here with the same complaint.  Pain and burning sensation to right foot.

## 2021-02-06 NOTE — ED Provider Notes (Signed)
[facility] Provider Note  CSN: [id number] Arrival date & time: 02/06/21 1221    History Chief Complaint  Patient presents with  . Foot Pain    Gloria Gallagher is a 70 y.o. female with history of ESRD on HD x 30 years (she is unsure the underlying cause) she does not otherwise go to the doctor or take any medications other than Lorin Picket, has had R foot pain for the last several weeks. She has been in the ED twice previously for same, treated for gout without improvement. Seen again 4 days ago, had neg xray, doppler showed small chronic non-occlusive clot, otherwise neg. She does not have a PCP, states she is in the process of switching insurance. She denies any injuries, no fevers. Some intermittent swelling to the foot/ankle. Pain feels like a burning/hot sensation to the top of the foot.    Past Medical History:  Diagnosis Date  . Arthritis   . Dialysis patient ([place])   . Gout   . Renal disorder     History reviewed. No pertinent surgical history.  History reviewed. No pertinent family history.  Social History   Tobacco Use  . Smoking status: Never    Passive exposure: Never  . Smokeless tobacco: Never  Vaping Use  . Vaping Use: Never used  Substance Use Topics  . Alcohol use: Yes    Comment: occasionally  . Drug use: Never     Home Medications Prior to Admission medications   Medication Sig Start Date End Date Taking? Authorizing Provider  allopurinol (ZYLOPRIM) 100 MG tablet Take 100 mg by mouth 2 (two) times daily. 01/25/21  Yes [provider]  Ferric Citrate (AURYXIA PO) Take 1 tablet by mouth daily.   Yes [provider]  predniSONE (DELTASONE) 20 MG tablet Take 2 tablets (40 mg total) by mouth daily. Patient not taking: No sig reported 12/29/20   Hayden Rasmussen, MD     Allergies    Penicillin g   Review of Systems   Review of Systems A comprehensive review of systems was completed and negative except  as noted in HPI.    Physical Exam BP 117/80   Pulse 78   Temp 98.3 F (36.8 C)   Resp 17   Ht '5\' 5"'$  (1.651 m)   Wt 79.4 kg   SpO2 100%   BMI 29.12 kg/m   Physical Exam Vitals and nursing note reviewed.  HENT:     Head: Normocephalic.     Nose: Nose normal.  Eyes:     Extraocular Movements: Extraocular movements intact.  Pulmonary:     Effort: Pulmonary effort is normal.  Musculoskeletal:        General: Swelling (mild dorsal R foot) present. No tenderness. Normal range of motion.     Cervical back: Neck supple.     Comments: Normal pulses, no signs of infection  Skin:    Findings: No rash (on exposed skin).  Neurological:     Mental Status: She is alert and oriented to person, place, and time.  Psychiatric:        Mood and Affect: Mood normal.     ED Results / Procedures / Treatments   Labs (all labs ordered are listed, but only abnormal results are displayed) Labs Reviewed  CBC WITH DIFFERENTIAL/PLATELET  COMPREHENSIVE METABOLIC PANEL    EKG None   Radiology No results found.  Procedures Procedures  Medications Ordered in the ED Medications - No data to display  MDM Rules/Calculators/A&P MDM Patient with persistent burning in her R foot. Sounds like a neuropathy, although no history of same. She has not had labs checked at recent ED visits, no other records available. She is unable to tell me the name of her nephrologist.   ED Course  I have reviewed the triage vital signs and the nursing notes.  Pertinent labs & imaging results that were available during my care of the patient were reviewed by me and considered in my medical decision making (see chart for details).  Clinical Course as of 02/06/21 1919  Mon Feb 06, 2021  1620 CBC with mild normocytic anemia. Otherwise normal.  [CS]  [id number] CMP consistent with ESRD, otherwise no concerning findings. Given her symptoms are possibly neuropathic, will begin renal dose gabapentin. Advised that her  symptoms would best be managed by PCP and/or Podiatry.  [CS]    Clinical Course User Index [CS] Truddie Hidden, MD    Final Clinical Impression(s) / ED Diagnoses Final diagnoses:  None    Rx / DC Orders ED Discharge Orders     None        Truddie Hidden, MD 02/06/21 1919

## 2021-02-27 ENCOUNTER — Encounter: Payer: Self-pay | Admitting: Podiatry

## 2021-02-27 ENCOUNTER — Ambulatory Visit (INDEPENDENT_AMBULATORY_CARE_PROVIDER_SITE_OTHER): Payer: Medicare Other | Admitting: Podiatry

## 2021-02-27 ENCOUNTER — Other Ambulatory Visit: Payer: Self-pay

## 2021-02-27 DIAGNOSIS — M19079 Primary osteoarthritis, unspecified ankle and foot: Secondary | ICD-10-CM | POA: Diagnosis not present

## 2021-02-27 DIAGNOSIS — G5791 Unspecified mononeuropathy of right lower limb: Secondary | ICD-10-CM

## 2021-02-27 NOTE — Progress Notes (Signed)
  Subjective:  Patient ID: Gloria Gallagher, female    DOB: [birth year],   MRN: [id number]  Chief Complaint  Patient presents with   Foot Pain    Dorsal forefoot and toes right - aching x swelling x 1 month, no injury, burning a lot at night, ER xrayed-said neuropathy, also did venous study, Rx'd gabapentin '100mg'$  [facility]-no help   New Patient (Initial Visit)    70 y.o. female presents for right foot pain that has been present for several months. Relates burning and tinlging.  States she has been on gabapentin without much relief. Currently on dialysis.. Denies any other pedal complaints. Denies n/v/f/c.   Past Medical History:  Diagnosis Date   Arthritis    Dialysis patient [facility])    Gout    Renal disorder     Objective:  Physical Exam: Vascular: DP/PT pulses 2/4 bilateral. CFT <3 seconds. Normal hair growth on digits. No edema.  Skin. No lacerations or abrasions bilateral feet.  Musculoskeletal: MMT 5/5 bilateral lower extremities in DF, PF, Inversion and Eversion. Deceased ROM in DF of ankle joint. Mildly tender to dorsal midfoot.  Neurological: Sensation intact to light touch. Positive tinel over dorsal sensory nerves.   Assessment:   1. Arthritis of midfoot      Plan:  Patient was evaluated and treated and all questions answered. Discussed mifoot arthirits and inflammation of joint  as well as neruitis. and treatment options with patient.  Radiographs reviewed and discussed with patient. Degenerative changes noted throughout midfoot.  Discussed stiff sole shoes and recommend use of carbon fiber foot plate.  Discussed compression stockings.  Discussed follow-up with doctor for increased dosage of gabapentin.  Patient to return in 6 weeks or sooner if concerns arise.    Lorenda Peck, DPM

## 2021-04-10 ENCOUNTER — Ambulatory Visit: Payer: Medicare Other | Admitting: Podiatry

## 2021-04-18 ENCOUNTER — Emergency Department (HOSPITAL_COMMUNITY): Payer: Medicare Other

## 2021-04-18 ENCOUNTER — Encounter (HOSPITAL_COMMUNITY): Payer: Self-pay

## 2021-04-18 ENCOUNTER — Other Ambulatory Visit: Payer: Self-pay

## 2021-04-18 ENCOUNTER — Emergency Department (HOSPITAL_COMMUNITY)
Admission: EM | Admit: 2021-04-18 | Discharge: 2021-04-18 | Disposition: A | Discharge status: Home / Self Care | Payer: Medicare Other | Attending: Emergency Medicine | Admitting: Emergency Medicine

## 2021-04-18 DIAGNOSIS — R9431 Abnormal electrocardiogram [ECG] [EKG]: Secondary | ICD-10-CM | POA: Diagnosis present

## 2021-04-18 DIAGNOSIS — H9311 Tinnitus, right ear: Secondary | ICD-10-CM | POA: Diagnosis not present

## 2021-04-18 DIAGNOSIS — N186 End stage renal disease: Secondary | ICD-10-CM | POA: Insufficient documentation

## 2021-04-18 DIAGNOSIS — R011 Cardiac murmur, unspecified: Secondary | ICD-10-CM

## 2021-04-18 DIAGNOSIS — Z992 Dependence on renal dialysis: Secondary | ICD-10-CM | POA: Insufficient documentation

## 2021-04-18 DIAGNOSIS — H6691 Otitis media, unspecified, right ear: Secondary | ICD-10-CM

## 2021-04-18 DIAGNOSIS — I4891 Unspecified atrial fibrillation: Secondary | ICD-10-CM | POA: Insufficient documentation

## 2021-04-18 DIAGNOSIS — R0602 Shortness of breath: Secondary | ICD-10-CM | POA: Insufficient documentation

## 2021-04-18 LAB — CBC
HCT: 38.2 % (ref 36.0–46.0)
Hemoglobin: 11.9 g/dL — ABNORMAL LOW (ref 12.0–15.0)
MCH: 30.8 pg (ref 26.0–34.0)
MCHC: 31.2 g/dL (ref 30.0–36.0)
MCV: 99 fL (ref 80.0–100.0)
Platelets: 210 10*3/uL (ref 150–400)
RBC: 3.86 MIL/uL — ABNORMAL LOW (ref 3.87–5.11)
RDW: 13.1 % (ref 11.5–15.5)
WBC: 5.2 10*3/uL (ref 4.0–10.5)
nRBC: 0 % (ref 0.0–0.2)

## 2021-04-18 LAB — BRAIN NATRIURETIC PEPTIDE: B Natriuretic Peptide: 464.9 pg/mL — ABNORMAL HIGH (ref 0.0–100.0)

## 2021-04-18 LAB — TROPONIN I (HIGH SENSITIVITY): Troponin I (High Sensitivity): 48 ng/L — ABNORMAL HIGH (ref <18)

## 2021-04-18 LAB — BASIC METABOLIC PANEL
Anion gap: 15 (ref 5–15)
BUN: 46 mg/dL — ABNORMAL HIGH (ref 8–23)
CO2: 30 mmol/L (ref 22–32)
Calcium: 9.8 mg/dL (ref 8.9–10.3)
Chloride: 91 mmol/L — ABNORMAL LOW (ref 98–111)
Creatinine, Ser: 9.32 mg/dL — ABNORMAL HIGH (ref 0.44–1.00)
GFR, Estimated: 4 mL/min — ABNORMAL LOW (ref >60)
Glucose, Bld: 93 mg/dL (ref 70–99)
Potassium: 3.7 mmol/L (ref 3.5–5.1)
Sodium: 136 mmol/L (ref 135–145)

## 2021-04-18 LAB — MAGNESIUM: Magnesium: 2.4 mg/dL (ref 1.7–2.4)

## 2021-04-18 IMAGING — CT CT ANGIO HEAD-NECK (W OR W/O PERF)
1 of 11 series · 5 of 33 positions shown · IV contrast (APPLIED)
Comparison: None.

CLINICAL DATA: Provided history: Abnormal sound in right ear with
new heart murmur and atrial fibrillation. Concern for possible
arterial occlusion.

EXAM:
CT ANGIOGRAPHY HEAD AND NECK
TECHNIQUE: Multidetector CT imaging of the head and neck was performed using
the standard protocol during bolus administration of intravenous
contrast. Multiplanar CT image reconstructions and MIPs were
obtained to evaluate the vascular anatomy. Carotid stenosis
measurements (when applicable) are obtained utilizing NASCET
criteria, using the distal internal carotid diameter as the
denominator.
CONTRAST:  50mL OMNIPAQUE IOHEXOL 350 MG/ML SOLN

[Series 11: ax thins · axial · 0.39mm/px · z∈[-253,-37]mm · 5 of 327 slices shown]
[im 55/327  soft-tissue]
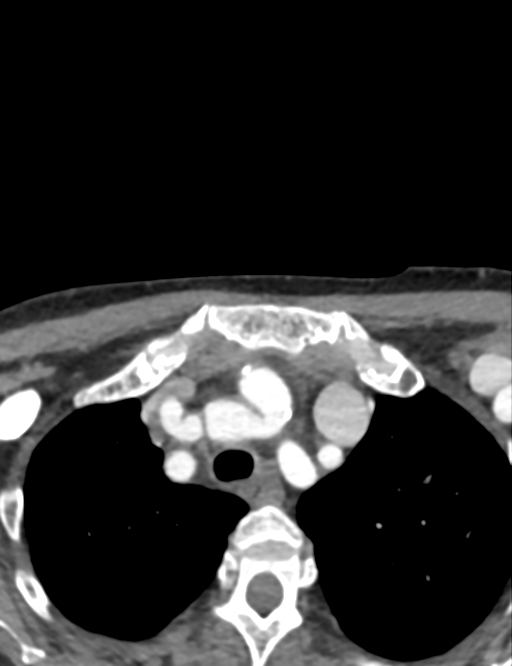
[im 109/327  bone]
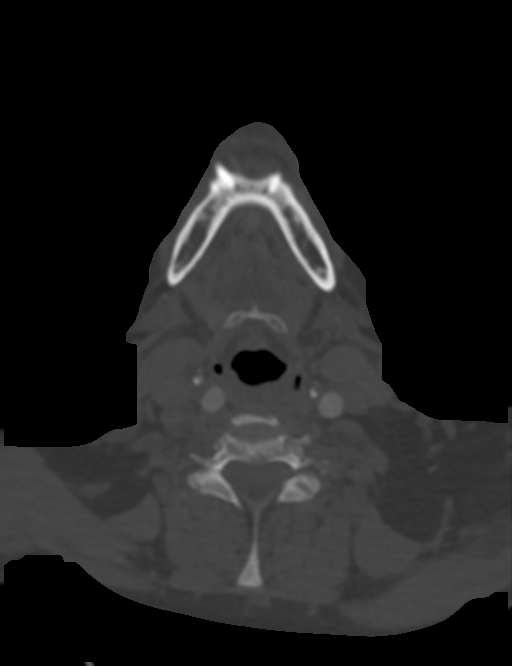
[im 164/327  soft-tissue]
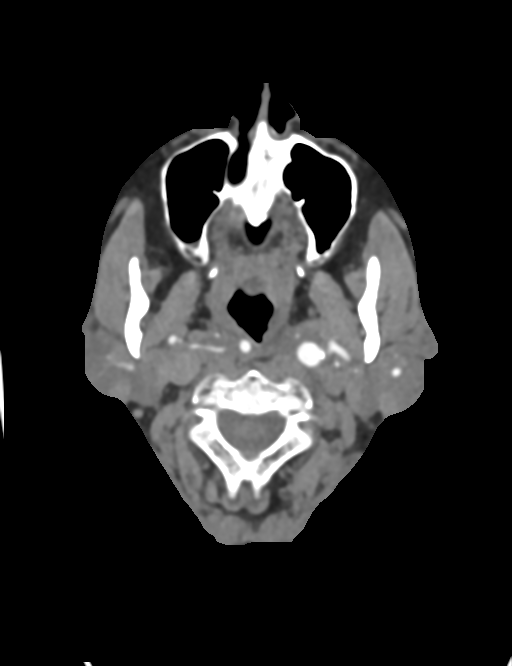
[im 218/327  bone]
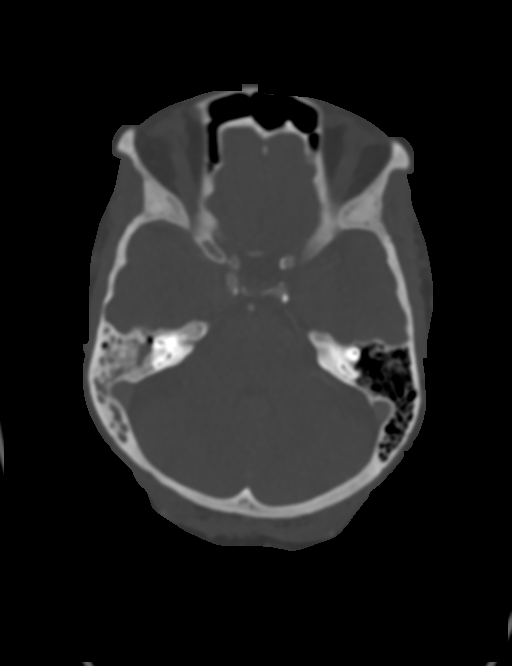
[im 272/327  soft-tissue]
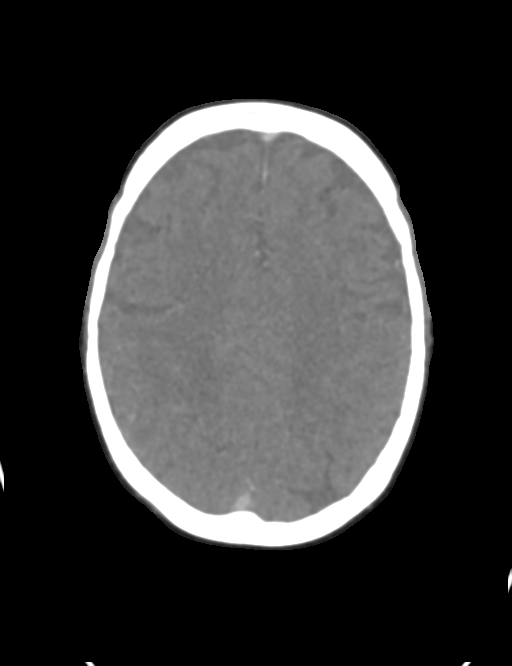

[5 of 33 positions shown; findings below may reference images not displayed]

FINDINGS: CT HEAD FINDINGS

Brain:

Mild generalized cerebral and cerebellar atrophy.

Moderate patchy and ill-defined hypoattenuation within the cerebral
white matter, nonspecific but compatible with chronic small vessel
ischemic disease.

There is no acute intracranial hemorrhage.

No demarcated cortical infarct.

No extra-axial fluid collection.

No evidence of an intracranial mass.

No midline shift.

Vascular: No hyperdense vessel.  Atherosclerotic calcifications.

Skull: Normal. Negative for fracture or focal lesion.

Sinuses: No significant paranasal sinus disease.

Orbits: No acute or significant orbital finding.

Other: Large right middle ear/mastoid effusion.

Review of the MIP images confirms the above findings

CTA NECK FINDINGS

Aortic arch: Common origin of the innominate and left common carotid
arteries. Atherosclerotic plaque within the visualized aortic arch
and proximal major branch vessels of the neck. No hemodynamically
significant innominate or proximal subclavian artery stenosis.

Right carotid system: CCA and ICA patent within the neck. Soft and
calcified plaque within the distal CCA, carotid bifurcation a and
proximal to mid ICA. Apparent stenosis of up to 60% within the
proximal/mid ICA. Additionally, the right ICA is asymmetrically
small in caliber as compared to the left.

Left carotid system: CCA and ICA patent within the neck without
hemodynamically significant stenosis (50% or greater). Mild
calcified plaque about the carotid bifurcation and within the
proximal ICA. Tortuosity of the distal cervical ICA.

Vertebral arteries: Vertebral arteries patent within the neck. The
left vertebral artery is dominant. Calcified plaque at the origin of
right vertebral artery with apparent severe stenosis. Calcified
plaque at the origin of the left vertebral artery with resultant
mild stenosis.

Skeleton: No acute bony abnormality.

Other neck: No neck mass or cervical lymphadenopathy. Thyroid
unremarkable.

Upper chest: No consolidation within the imaged lung apices.
Centrilobular emphysema. Dilated main pulmonary artery, measuring
5.5 cm in diameter.

Review of the MIP images confirms the above findings

CTA HEAD FINDINGS

Anterior circulation:

The intracranial internal carotid arteries are patent. Calcified
plaque within both vessels. Apparent moderate/severe stenosis within
the cavernous right ICA. Moderate/severe stenosis within the
supraclinoid left ICA. The M1 middle cerebral arteries are patent.
Atherosclerotic irregularity of the M2 and more distal middle
cerebral arteries, bilaterally. No M2 proximal branch occlusion or
high-grade proximal stenosis is identified.The anterior cerebral
arteries are patent. The A1 right ACA is developmentally diminutive
or absent. 2 mm inferiorly projecting infundibulum arising from the
supraclinoid left ICA.

Posterior circulation:

The intracranial vertebral arteries are patent. Mild atherosclerotic
irregularity of both vessels without stenosis. The basilar artery is
patent. The posterior cerebral arteries are patent. Both posterior
cerebral arteries demonstrate distal branch atherosclerotic
irregularity. Posterior communicating arteries are diminutive or
absent bilaterally. 1 mm aneurysm versus focus of eccentric plaque
along the anteromedial aspect of the V4 left vertebral artery
(series 11, image 131) (series 12, image 134).

Venous sinuses: Within the limitations of contrast timing, no
convincing thrombus.

Anatomic variants: As described.

Review of the MIP images confirms the above findings
IMPRESSION: CT head:

1. Large right middle ear/mastoid effusion.
2. No evidence of acute intracranial abnormality.
3. Moderate chronic small vessel ischemic changes within the
cerebral white matter.
4. Mild generalized cerebral and cerebellar atrophy.

CTA neck:

1. The common carotid and internal carotid arteries are patent
within the neck. Atherosclerotic plaque within both carotid systems,
as described. Estimated 60% atherosclerotic stenosis within the
proximal/mid right ICA. No hemodynamically significant stenosis
within the left CCA or cervical ICA.
2. Vertebral arteries patent within the neck. Calcified plaque
results in apparent severe stenosis at the origin of the
non-dominant right vertebral artery. Mild atherosclerotic narrowing
at the origin of the left vertebral artery.
3. Aortic Atherosclerosis ([2K]-[2K]) and Emphysema ([2K]-[2K]).

CTA head:

1. Intracranial atherosclerotic disease with multifocal stenoses,
most notably as follows.
2. Moderate/severe stenosis of the cavernous right ICA.
3. Moderate/severe stenosis of the supraclinoid left ICA.
4. 1 mm aneurysm versus focus of eccentric plaque along the
anteromedial aspect of the V4 left vertebral artery.

## 2021-04-18 IMAGING — CR DG CHEST 1V
1 series · 1 of 1 positions shown · non-contrast
Comparison: None.

CLINICAL DATA: Atrial fibrillation. No current complaints. History
of heart murmur.

EXAM:
CHEST  1 VIEW

[chest pa]
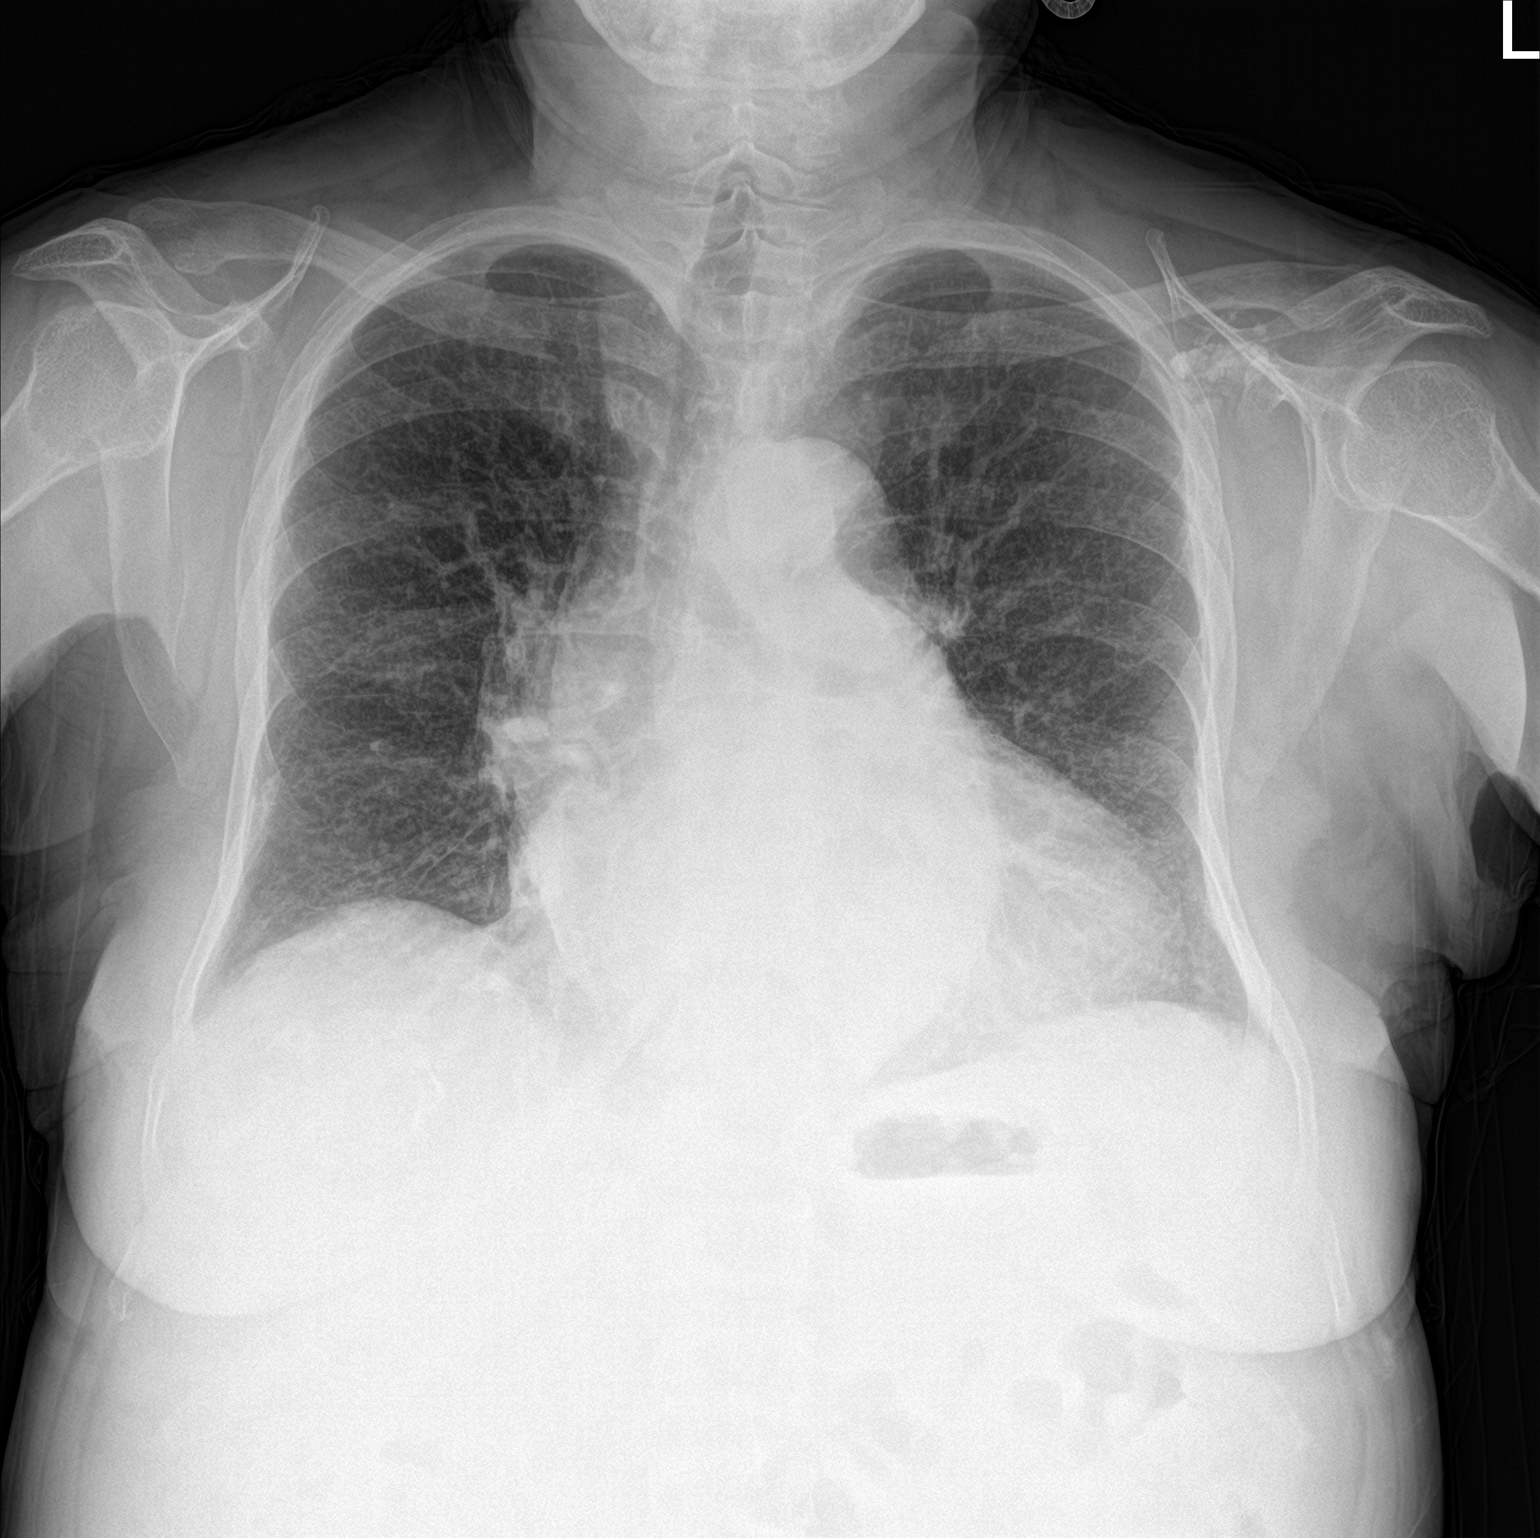

[1 of 1 positions shown; findings below may reference images not displayed]

FINDINGS: [3H] hours. The heart appears mildly enlarged. The aorta is
tortuous. There is vascular congestion with mild interstitial
prominence, but no overt pulmonary edema, confluent airspace
opacity, pneumothorax or significant pleural effusion. There are
calcifications overlying the superomedial aspect of the left
scapula, probably loose bodies in the superior subscapularis recess.
No acute osseous findings.
IMPRESSION: Cardiomegaly with vascular congestion.  No overt pulmonary edema.

## 2021-04-18 MED ORDER — DOXYCYCLINE HYCLATE 100 MG PO CAPS: 100.0000 mg | ORAL_CAPSULE | Freq: Two times a day (BID) | ORAL | 0 refills | Status: DC

## 2021-04-18 MED ORDER — IOHEXOL 350 MG/ML SOLN
50.0000 mL | Freq: Once | INTRAVENOUS | Status: AC | PRN
  Administered 2021-04-18: 50 mL via INTRAVENOUS

## 2021-04-18 MED ORDER — APIXABAN 5 MG PO TABS: 5.0000 mg | ORAL_TABLET | Freq: Two times a day (BID) | ORAL | 0 refills | Status: DC

## 2021-04-18 NOTE — ED Provider Notes (Signed)
Emergency Medicine Provider Triage Evaluation Note  Gloria Gallagher , a 70 y.o. female  was evaluated in triage.  Pt complains of abnormal EKG. patient evaluated at urgent care prior to arrival due to a "wishy-washy" sound in right ear x3 days. While at [facility], an EKG was obtained which showed patient was in A. Fib. No history of A. Fib.  Denies palpitations and chest pain.  No shortness of breath.  Denies lower extremity edema.  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  Denies otalgia.  No drainage from ear.  Patient also diagnosed with new heart murmur at urgent care prior to arrival.  Patient sent to the ED for further evaluation due to new onset of A. fib. No cardiac history.   Review of Systems  Positive: Ear issue Negative: CP  Physical Exam  BP 132/80 (BP Location: Right Arm)   Pulse 96   Temp 97.9 F (36.6 C) (Oral)   Resp 15   SpO2 98%  Gen:   Awake, no distress   Resp:  Normal effort  MSK:   Moves extremities without difficulty  Other:    Medical Decision Making  Medically screening exam initiated at 1:00 PM.  Appropriate orders placed.  Gloria Gallagher was informed that the remainder of the evaluation will be completed by another provider, this initial triage assessment does not replace that evaluation, and the importance of remaining in the ED until their evaluation is complete.  A. Fib labs EKG to confirm patient is still in A. Fib Normal HR in triage   Gloria Gallagher 04/18/21 1302    Gloria Dusky, MD 04/18/21 [phone]

## 2021-04-18 NOTE — ED Provider Notes (Addendum)
Care handoff from [facility], PA-C at shift change. Please see their note for further information.   Briefly: Dialysis patient presents today from UC for abnormal EKG. Went to UC for 'whooshing sound' in her right ear, was found to have new onset afib with new heart murmur. She is asymptomatic. Labs unchanged from baseline, potassium 3.7.   Plan: CTA neck for further evaluation in the presence of whooshing sound in the ear, concern for dissection. No appreciated carotid bruit. If normal, in the presence of new onset afib and CHA2DS2-VASc score of 3, will discuss anticoagulation with pharmacy with A.fib clinic outpatient follow-up.  CTA neck unremarkable for dissection. Stenosis noted throughout however unsurprising in 70 year old dialysis patient. Did note a large right middle ear effusion, no other emergent findings.  Ear exam performed which revealed erythematous bulging TMs consistent with acute otitis media. Patient also expresses pain on ear exam. Plan to treat with Doxycycline and follow-up.  Additionally, discussed anticoagulation with pharmacy who recommends Elliquis 5mg  BID. I have thoroughly discussed findings with patient as well as risks vs benefits of anticoagulation. They are understanding and amenable with plan. Stable for discharge at this time and educated on red flag symptoms that would prompt immediate return. Discharged in stable condition.  Findings and plan of care discussed with supervising physician Dr. Darl Householder who is in agreement.      Nestor Lewandowsky 04/18/21 1903    Bud Face, PA-C 04/18/21 1903    Drenda Freeze, MD 04/25/21 [phone]

## 2021-04-18 NOTE — ED Notes (Signed)
Got patient into a gown on the monitor patient is resting with family at bedside got patient a warm blanket and a pillow

## 2021-04-18 NOTE — ED Provider Notes (Signed)
[facility] EMERGENCY DEPARTMENT Provider Note   CSN: [id number] Arrival date & time: 04/18/21  1221     History Chief Complaint  Patient presents with   Abnormal ECG    Gloria Gallagher is a 70 y.o. female with PMHx ESRD on Dialysis MWF who presents to the ED today from UC for abnormal EKG. patient complains of a wishy-washy sound in her right ear for the past 3 days.  She went to urgent care for same and while there they noticed a new heart murmur with concern for referred murmur and carotid region with possible bruit.  She had an EKG done which did show A. fib without history of same and was advised to come to the ED for further evaluation.  Patient denies any specific shortness of breath.  She states sometimes she will get winded however attributed to age.  She is not short of breath currently.  She denies any chest pain.  She has no other complaints at this time.  She states that she is from [place] and her primary care doctor heard a murmur about a year ago however she never followed up with anyone as it was not bothering her.  The history is provided by the patient and medical records.      Past Medical History:  Diagnosis Date   Arthritis    Dialysis patient [facility])    Gout    Renal disorder     There are no problems to display for this patient.   History reviewed. No pertinent surgical history.   OB History     Gravida  1   Para  1   Term      Preterm      AB      Living  1      SAB      IAB      Ectopic      Multiple      Live Births              No family history on file.  Social History   Tobacco Use   Smoking status: Never    Passive exposure: Never   Smokeless tobacco: Never  Vaping Use   Vaping Use: Never used  Substance Use Topics   Alcohol use: Yes    Comment: occasionally   Drug use: Never    Home Medications Prior to Admission medications   Medication Sig Start Date End Date Taking? Authorizing  Provider  Ferric Citrate (AURYXIA PO) Take 1 tablet by mouth daily.    [provider]  gabapentin (NEURONTIN) 100 MG capsule Take 1 capsule (100 mg total) by mouth at bedtime. 02/06/21 03/08/21  Truddie Hidden, MD    Allergies    Penicillin g  Review of Systems   Review of Systems  Constitutional:  Negative for chills and fever.  HENT:  Positive for hearing loss (abnormal sound in R ear). Negative for ear discharge and ear pain.   Respiratory:  Negative for shortness of breath.   Cardiovascular:  Negative for chest pain.  All other systems reviewed and are negative.  Physical Exam Updated Vital Signs BP 122/71   Pulse 81   Temp 97.9 F (36.6 C) (Oral)   Resp 20   SpO2 100%   Physical Exam Vitals and nursing note reviewed.  Constitutional:      Appearance: She is not ill-appearing or diaphoretic.  HENT:     Head: Normocephalic and atraumatic.  Eyes:  Conjunctiva/sclera: Conjunctivae normal.  Neck:     Vascular: No carotid bruit.  Cardiovascular:     Rate and Rhythm: Normal rate and regular rhythm.     Heart sounds: Murmur heard.     Comments: 2/6 systolic murmur Pulmonary:     Effort: Pulmonary effort is normal.     Breath sounds: Normal breath sounds. No wheezing, rhonchi or rales.  Abdominal:     Palpations: Abdomen is soft.     Tenderness: There is no abdominal tenderness.  Musculoskeletal:     Cervical back: Neck supple. No tenderness.  Skin:    General: Skin is warm and dry.  Neurological:     Mental Status: She is alert.    ED Results / Procedures / Treatments   Labs (all labs ordered are listed, but only abnormal results are displayed) Labs Reviewed  BASIC METABOLIC PANEL - Abnormal; Notable for the following components:      Result Value   Chloride 91 (*)    BUN 46 (*)    Creatinine, Ser 9.32 (*)    GFR, Estimated 4 (*)    All other components within normal limits  CBC - Abnormal; Notable for the following components:   RBC 3.86  (*)    Hemoglobin 11.9 (*)    All other components within normal limits  MAGNESIUM  BRAIN NATRIURETIC PEPTIDE  TROPONIN I (HIGH SENSITIVITY)    EKG EKG Interpretation  Date/Time:  Tuesday April 18 2021 13:03:18 EST Ventricular Rate:  82 PR Interval:    QRS Duration: 92 QT Interval:  454 QTC Calculation: 530 R Axis:   -69 Text Interpretation: Atrial fibrillation with a competing junctional pacemaker Left anterior fascicular block Anterior infarct , age undetermined Prolonged QT Abnormal ECG Confirmed by Godfrey Pick [id number]) on 04/18/2021 1:52:23 PM  Radiology No results found.  Procedures Procedures   Medications Ordered in ED Medications - No data to display  ED Course  I have reviewed the triage vital signs and the nursing notes.  Pertinent labs & imaging results that were available during my care of the patient were reviewed by me and considered in my medical decision making (see chart for details).  Clinical Course as of 04/18/21 1543  Tue Apr 18, 2021  1513 DG Chest 1 View IMPRESSION: Cardiomegaly with vascular congestion. No overt pulmonary edema.   [MV]    Clinical Course User Index [MV] Eustaquio Maize, PA-C   MDM Rules/Calculators/A&P     CHA2DS2-VASc Score: 3                     70 year old female who presents to the ED today from urgent care for abnormal EKG.  Found to be in A. fib with a history of same.  Initially presented with complaint of a wishy-washy sensation in her right ear for the past 3 days.  On arrival to the ED today vitals are stable.  Patient appears to be no acute distress.  She is found to be on A. fib on EKG with prolonged QTC of 530.  Was medically screened in the waiting room and work-up started including CBC, BMP, mag, chest x-ray.  BC without leukocytosis and hemoglobin 11.9.  Anemia appears to be baseline for patient.  BMP with a creatinine 9.32 and BUN 46.  Potassium within normal limits at 3.7.  History of dialysis, last dialyzed  yesterday.  Magnesium of 2.4. CXR with mild vascular congestion; BNP added. On my exam I do hear a 2 out  of 6 stock murmur mostly to pulmonic area.  No obvious carotid bruit appreciated at this time however given complaint of a abnormal sound in right ear with new murmur and A. fib we will plan for CTA head and neck for further evaluation.  CHA2DS2-VASc score of 3; we will plan to start on oral anticoagulation and provide A. fib clinic outpatient follow-up.  At shift change case signed out to oncoming team Lavonna Rua, PA-C who will dispo patient accordingly. Pt to be started on anticoagulation for A fib with A fib clinic follow up. If CTA negative can consider ENT follow up for noise in R ear.   This note was prepared using Dragon voice recognition software and may include unintentional dictation errors due to the inherent limitations of voice recognition software.  Final Clinical Impression(s) / ED Diagnoses Final diagnoses:  Atrial fibrillation, unspecified type [facility])  Murmur  Tinnitus of right ear    Rx / DC Orders ED Discharge Orders          Ordered    Amb Referral to AFIB Clinic        04/18/21 [place], PA-C 04/18/21 1543    Godfrey Pick, MD 04/19/21 [phone]

## 2021-04-18 NOTE — ED Triage Notes (Signed)
Patient sent from ucc for further evaluation of abnormal sound to right ear since Saturday. Patient had ekg and sent for further evaluation. No cp, no SOB. Last dialysis yesterday

## 2021-04-18 NOTE — Discharge Instructions (Signed)
Take medications as prescribed. Follow-up with afib clinic which we have referred you to for further evaluation of your irregular heart rhythm.   Return if development of any new or worsening symptoms

## 2021-04-25 ENCOUNTER — Other Ambulatory Visit: Payer: Self-pay

## 2021-04-25 ENCOUNTER — Ambulatory Visit (HOSPITAL_COMMUNITY)
Admission: RE | Admit: 2021-04-25 | Discharge: 2021-04-25 | Disposition: A | Discharge status: Home / Self Care | Payer: Medicare Other | Source: Ambulatory Visit | Attending: Physician Assistant | Admitting: Physician Assistant

## 2021-04-25 ENCOUNTER — Encounter (HOSPITAL_COMMUNITY): Payer: Self-pay | Admitting: Physician Assistant

## 2021-04-25 VITALS — BP 114/68 | HR 84 | Ht 65.0 in | Wt 165.8 lb

## 2021-04-25 DIAGNOSIS — Z87891 Personal history of nicotine dependence: Secondary | ICD-10-CM | POA: Insufficient documentation

## 2021-04-25 DIAGNOSIS — I700 Atherosclerosis of aorta: Secondary | ICD-10-CM | POA: Insufficient documentation

## 2021-04-25 DIAGNOSIS — Z7901 Long term (current) use of anticoagulants: Secondary | ICD-10-CM | POA: Diagnosis not present

## 2021-04-25 DIAGNOSIS — R011 Cardiac murmur, unspecified: Secondary | ICD-10-CM | POA: Insufficient documentation

## 2021-04-25 DIAGNOSIS — N186 End stage renal disease: Secondary | ICD-10-CM | POA: Insufficient documentation

## 2021-04-25 DIAGNOSIS — H6690 Otitis media, unspecified, unspecified ear: Secondary | ICD-10-CM | POA: Diagnosis not present

## 2021-04-25 DIAGNOSIS — I4819 Other persistent atrial fibrillation: Secondary | ICD-10-CM | POA: Insufficient documentation

## 2021-04-25 DIAGNOSIS — D6869 Other thrombophilia: Secondary | ICD-10-CM | POA: Diagnosis not present

## 2021-04-25 DIAGNOSIS — Z992 Dependence on renal dialysis: Secondary | ICD-10-CM | POA: Diagnosis not present

## 2021-04-25 DIAGNOSIS — I4892 Unspecified atrial flutter: Secondary | ICD-10-CM | POA: Insufficient documentation

## 2021-04-25 MED ORDER — APIXABAN 5 MG PO TABS: 5.0000 mg | ORAL_TABLET | Freq: Two times a day (BID) | ORAL | 3 refills | Status: DC

## 2021-04-25 NOTE — H&P (View-Only) (Signed)
Primary Care Physician: Default, Provider, MD Primary Cardiologist: none Primary Electrophysiologist: none Referring Physician: [facility]   Gloria Gallagher is a 70 y.o. female with a history of ESRD, aortic atherosclerosis, carotid artery disease, and new onset atrial fibrillation who presents for consultation in the [facility].  The patient was initially diagnosed with atrial fibrillation after presenting to urgent care for a wishy-washy sound in her right ear.  A new heart murmur was noted with concern for referred murmur and carotid region with possible bruit.  She had an EKG done which did show A. fib without history of same and was advised to come to the ED for further evaluation. CTA neck unremarkable for dissection. She was diagnosed with acute otitis media and was started on doxycycline. She was unaware of her arrhythmia. Patient was started on Eliquis for a CHADS2VASC score of 3. In hindsight, patient has noted increased exercise intolerance for the past 3 months.   Today, she denies symptoms of palpitations, chest pain, shortness of breath, orthopnea, PND, lower extremity edema, dizziness, presyncope, syncope, snoring, daytime somnolence, bleeding, or neurologic sequela. The patient is tolerating medications without difficulties and is otherwise without complaint today.    Atrial Fibrillation Risk Factors:  she does not have symptoms or diagnosis of sleep apnea. she does not have a history of rheumatic fever. she does not have a history of alcohol use. The patient does have a history of early familial atrial fibrillation or other arrhythmias. Two sisters have afib.  she has a BMI of Body mass index is 27.59 kg/m.Marland Kitchen Filed Weights   04/25/21 0836  Weight: 75.2 kg    No family history on file.   Atrial Fibrillation Management history:  Previous antiarrhythmic drugs: none Previous cardioversions: none Previous ablations: none CHADS2VASC  score: 3 Anticoagulation history: 3   Past Medical History:  Diagnosis Date   Arthritis    Dialysis patient ([facility])    Gout    Renal disorder    No past surgical history on file.  Current Outpatient Medications  Medication Sig Dispense Refill   apixaban (ELIQUIS) 5 MG TABS tablet Take 1 tablet (5 mg total) by mouth 2 (two) times daily. 60 tablet 0   doxycycline (VIBRAMYCIN) 100 MG capsule Take 1 capsule (100 mg total) by mouth 2 (two) times daily. 20 capsule 0   Ferric Citrate (AURYXIA PO) Take 1 tablet by mouth daily.     gabapentin (NEURONTIN) 100 MG capsule Take 1 capsule (100 mg total) by mouth at bedtime. 30 capsule 0   No current facility-administered medications for this encounter.    Allergies  Allergen Reactions   Penicillin G Hives    Social History   Socioeconomic History   Marital status: Single    Spouse name: Not on file   Number of children: Not on file   Years of education: Not on file   Highest education level: Not on file  Occupational History   Not on file  Tobacco Use   Smoking status: Former    Types: Cigarettes    Passive exposure: Never   Smokeless tobacco: Never   Tobacco comments:    Former smoker 04/25/2021  Vaping Use   Vaping Use: Never used  Substance and Sexual Activity   Alcohol use: Not Currently    Comment: occasionally   Drug use: Never   Sexual activity: Not Currently  Other Topics Concern   Not on file  Social History Narrative   Not  on file   Social Determinants of Health   Financial Resource Strain: Not on file  Food Insecurity: Not on file  Transportation Needs: Not on file  Physical Activity: Not on file  Stress: Not on file  Social Connections: Not on file  Intimate Partner Violence: Not on file     ROS- All systems are reviewed and negative except as per the HPI above.  Physical Exam: Vitals:   04/25/21 0836  BP: 114/68  Pulse: 84  Weight: 75.2 kg  Height: 5\' 5"  (1.651 m)    GEN- The patient is a  well appearing female, alert and oriented x 3 today.   Head- normocephalic, atraumatic Eyes-  Sclera clear, conjunctiva pink Ears- hearing intact Oropharynx- clear Neck- supple  Lungs- Clear to ausculation bilaterally, normal work of breathing Heart- irregular rate and rhythm, no rubs or gallops, 2/6 systolic murmur  GI- soft, NT, ND, + BS Extremities- no clubbing, cyanosis, or edema MS- no significant deformity or atrophy Skin- no rash or lesion Psych- euthymic mood, full affect Neuro- strength and sensation are intact  Wt Readings from Last 3 Encounters:  04/25/21 75.2 kg  02/06/21 79.4 kg  02/02/21 79.4 kg    EKG today demonstrates  Afib, NST Vent. rate 84 BPM PR interval * ms QRS duration 110 ms QT/QTcB 402/475 ms   Epic records are reviewed at length today  CHA2DS2-VASc Score = 3  The patient's score is based upon: CHF History: 0 HTN History: 0 Diabetes History: 0 Stroke History: 0 Vascular Disease History: 1 (aortic atherosclerosis, carotid disease) Age Score: 1 Gender Score: 1      ASSESSMENT AND PLAN: 1. Persistent Atrial Fibrillation (ICD10:  I48.19) The patient's CHA2DS2-VASc score is 3, indicating a 3.2% annual risk of stroke.   General education about afib provided and questions answered. We also discussed her stroke risk and the risks and benefits of anticoagulation. We also discussed rhythm control options. Will plan for DCCV after 3 weeks of uninterrupted anticoagulation.  Continue Eliquis 5 mg BID Check echocardiogram   2. Secondary Hypercoagulable State (ICD10:  D68.69) The patient is at significant risk for stroke/thromboembolism based upon her CHA2DS2-VASc Score of 3.  Continue Apixaban (Eliquis).   3. ESRD HD on M/W/F    Follow up in the AF clinic post DCCV. Will also refer to establish care with primary cardiologist as she is new to the area.   [facility] [address] [place], [place]  [address] [phone] 04/25/2021 8:47 AM

## 2021-04-25 NOTE — Patient Instructions (Signed)
Cardioversion scheduled for Tuesday, January 3rd  -come to clinic at 930am for labs  - Arrive at the [facility] and go to admitting at [place] not eat or drink anything after midnight the night prior to your procedure.  - Take all your morning medication (except diabetic medications) with a sip of water prior to arrival.  - You will not be able to drive home after your procedure.  - Do NOT miss any doses of your blood thinner - if you should miss a dose please notify our office immediately.  - If you feel as if you go back into normal rhythm prior to scheduled cardioversion, please notify our office immediately. If your procedure is canceled in the cardioversion suite you will be charged a cancellation fee. Patients will be asked to: to mask in public and hand hygiene (no longer quarantine) in the 3 days prior to surgery, to report if any COVID-19-like illness or household contacts to COVID-19 to determine need for testing

## 2021-04-25 NOTE — Progress Notes (Signed)
Primary Care Physician: Default, Provider, MD Primary Cardiologist: none Primary Electrophysiologist: none Referring Physician: [facility]   Gloria Gallagher is a 70 y.o. female with a history of ESRD, aortic atherosclerosis, carotid artery disease, and new onset atrial fibrillation who presents for consultation in the [facility].  The patient was initially diagnosed with atrial fibrillation after presenting to urgent care for a wishy-washy sound in her right ear.  A new heart murmur was noted with concern for referred murmur and carotid region with possible bruit.  She had an EKG done which did show A. fib without history of same and was advised to come to the ED for further evaluation. CTA neck unremarkable for dissection. She was diagnosed with acute otitis media and was started on doxycycline. She was unaware of her arrhythmia. Patient was started on Eliquis for a CHADS2VASC score of 3. In hindsight, patient has noted increased exercise intolerance for the past 3 months.   Today, she denies symptoms of palpitations, chest pain, shortness of breath, orthopnea, PND, lower extremity edema, dizziness, presyncope, syncope, snoring, daytime somnolence, bleeding, or neurologic sequela. The patient is tolerating medications without difficulties and is otherwise without complaint today.    Atrial Fibrillation Risk Factors:  she does not have symptoms or diagnosis of sleep apnea. she does not have a history of rheumatic fever. she does not have a history of alcohol use. The patient does have a history of early familial atrial fibrillation or other arrhythmias. Two sisters have afib.  she has a BMI of Body mass index is 27.59 kg/m.Marland Kitchen Filed Weights   04/25/21 0836  Weight: 75.2 kg    No family history on file.   Atrial Fibrillation Management history:  Previous antiarrhythmic drugs: none Previous cardioversions: none Previous ablations: none CHADS2VASC  score: 3 Anticoagulation history: 3   Past Medical History:  Diagnosis Date   Arthritis    Dialysis patient ([place])    Gout    Renal disorder    No past surgical history on file.  Current Outpatient Medications  Medication Sig Dispense Refill   apixaban (ELIQUIS) 5 MG TABS tablet Take 1 tablet (5 mg total) by mouth 2 (two) times daily. 60 tablet 0   doxycycline (VIBRAMYCIN) 100 MG capsule Take 1 capsule (100 mg total) by mouth 2 (two) times daily. 20 capsule 0   Ferric Citrate (AURYXIA PO) Take 1 tablet by mouth daily.     gabapentin (NEURONTIN) 100 MG capsule Take 1 capsule (100 mg total) by mouth at bedtime. 30 capsule 0   No current facility-administered medications for this encounter.    Allergies  Allergen Reactions   Penicillin G Hives    Social History   Socioeconomic History   Marital status: Single    Spouse name: Not on file   Number of children: Not on file   Years of education: Not on file   Highest education level: Not on file  Occupational History   Not on file  Tobacco Use   Smoking status: Former    Types: Cigarettes    Passive exposure: Never   Smokeless tobacco: Never   Tobacco comments:    Former smoker 04/25/2021  Vaping Use   Vaping Use: Never used  Substance and Sexual Activity   Alcohol use: Not Currently    Comment: occasionally   Drug use: Never   Sexual activity: Not Currently  Other Topics Concern   Not on file  Social History Narrative   Not  on file   Social Determinants of Health   Financial Resource Strain: Not on file  Food Insecurity: Not on file  Transportation Needs: Not on file  Physical Activity: Not on file  Stress: Not on file  Social Connections: Not on file  Intimate Partner Violence: Not on file     ROS- All systems are reviewed and negative except as per the HPI above.  Physical Exam: Vitals:   04/25/21 0836  BP: 114/68  Pulse: 84  Weight: 75.2 kg  Height: 5\' 5"  (1.651 m)    GEN- The patient is a  well appearing female, alert and oriented x 3 today.   Head- normocephalic, atraumatic Eyes-  Sclera clear, conjunctiva pink Ears- hearing intact Oropharynx- clear Neck- supple  Lungs- Clear to ausculation bilaterally, normal work of breathing Heart- irregular rate and rhythm, no rubs or gallops, 2/6 systolic murmur  GI- soft, NT, ND, + BS Extremities- no clubbing, cyanosis, or edema MS- no significant deformity or atrophy Skin- no rash or lesion Psych- euthymic mood, full affect Neuro- strength and sensation are intact  Wt Readings from Last 3 Encounters:  04/25/21 75.2 kg  02/06/21 79.4 kg  02/02/21 79.4 kg    EKG today demonstrates  Afib, NST Vent. rate 84 BPM PR interval * ms QRS duration 110 ms QT/QTcB 402/475 ms   Epic records are reviewed at length today  CHA2DS2-VASc Score = 3  The patient's score is based upon: CHF History: 0 HTN History: 0 Diabetes History: 0 Stroke History: 0 Vascular Disease History: 1 (aortic atherosclerosis, carotid disease) Age Score: 1 Gender Score: 1      ASSESSMENT AND PLAN: 1. Persistent Atrial Fibrillation (ICD10:  I48.19) The patient's CHA2DS2-VASc score is 3, indicating a 3.2% annual risk of stroke.   General education about afib provided and questions answered. We also discussed her stroke risk and the risks and benefits of anticoagulation. We also discussed rhythm control options. Will plan for DCCV after 3 weeks of uninterrupted anticoagulation.  Continue Eliquis 5 mg BID Check echocardiogram   2. Secondary Hypercoagulable State (ICD10:  D68.69) The patient is at significant risk for stroke/thromboembolism based upon her CHA2DS2-VASc Score of 3.  Continue Apixaban (Eliquis).   3. ESRD HD on M/W/F    Follow up in the AF clinic post DCCV. Will also refer to establish care with primary cardiologist as she is new to the area.   [facility] [address] [place], [place]  [address] [phone] 04/25/2021 8:47 AM

## 2021-04-25 NOTE — Addendum Note (Signed)
Encounter addended by: Juluis Mire, RN on: 04/25/2021 9:28 AM  Actions taken: Clinical Note Signed, Pharmacy for encounter modified, Order list changed, Diagnosis association updated

## 2021-05-02 ENCOUNTER — Encounter (HOSPITAL_COMMUNITY): Payer: Self-pay | Admitting: Cardiovascular Disease

## 2021-05-16 ENCOUNTER — Ambulatory Visit (HOSPITAL_COMMUNITY): Payer: Medicare Other | Admitting: Anesthesiology

## 2021-05-16 ENCOUNTER — Ambulatory Visit (HOSPITAL_COMMUNITY)
Admission: RE | Admit: 2021-05-16 | Discharge: 2021-05-16 | Disposition: A | Discharge status: Home / Self Care | Payer: Medicare Other | Source: Ambulatory Visit | Attending: Physician Assistant | Admitting: Physician Assistant

## 2021-05-16 ENCOUNTER — Ambulatory Visit (HOSPITAL_COMMUNITY)
Admission: RE | Admit: 2021-05-16 | Discharge: 2021-05-16 | Disposition: A | Discharge status: Home / Self Care | Payer: Medicare Other | Source: Ambulatory Visit | Attending: Cardiovascular Disease | Admitting: Cardiovascular Disease

## 2021-05-16 ENCOUNTER — Encounter (HOSPITAL_COMMUNITY)
Admission: RE | Disposition: A | Discharge status: Home / Self Care | Payer: Self-pay | Source: Ambulatory Visit | Attending: Cardiovascular Disease

## 2021-05-16 ENCOUNTER — Encounter (HOSPITAL_COMMUNITY): Payer: Self-pay | Admitting: Cardiovascular Disease

## 2021-05-16 ENCOUNTER — Other Ambulatory Visit: Payer: Self-pay

## 2021-05-16 DIAGNOSIS — Z7901 Long term (current) use of anticoagulants: Secondary | ICD-10-CM | POA: Insufficient documentation

## 2021-05-16 DIAGNOSIS — Z87891 Personal history of nicotine dependence: Secondary | ICD-10-CM | POA: Diagnosis not present

## 2021-05-16 DIAGNOSIS — Z992 Dependence on renal dialysis: Secondary | ICD-10-CM | POA: Diagnosis not present

## 2021-05-16 DIAGNOSIS — I4819 Other persistent atrial fibrillation: Secondary | ICD-10-CM | POA: Insufficient documentation

## 2021-05-16 DIAGNOSIS — N186 End stage renal disease: Secondary | ICD-10-CM | POA: Diagnosis not present

## 2021-05-16 DIAGNOSIS — I700 Atherosclerosis of aorta: Secondary | ICD-10-CM | POA: Insufficient documentation

## 2021-05-16 DIAGNOSIS — D6869 Other thrombophilia: Secondary | ICD-10-CM | POA: Diagnosis not present

## 2021-05-16 HISTORY — PX: CARDIOVERSION: SHX1299

## 2021-05-16 LAB — CBC
HCT: 37.4 % (ref 36.0–46.0)
Hemoglobin: 11.9 g/dL — ABNORMAL LOW (ref 12.0–15.0)
MCH: 31.3 pg (ref 26.0–34.0)
MCHC: 31.8 g/dL (ref 30.0–36.0)
MCV: 98.4 fL (ref 80.0–100.0)
Platelets: 157 10*3/uL (ref 150–400)
RBC: 3.8 MIL/uL — ABNORMAL LOW (ref 3.87–5.11)
RDW: 12.9 % (ref 11.5–15.5)
WBC: 4.2 10*3/uL (ref 4.0–10.5)
nRBC: 0 % (ref 0.0–0.2)

## 2021-05-16 LAB — BASIC METABOLIC PANEL
Anion gap: 19 — ABNORMAL HIGH (ref 5–15)
BUN: 70 mg/dL — ABNORMAL HIGH (ref 8–23)
CO2: 24 mmol/L (ref 22–32)
Calcium: 9.8 mg/dL (ref 8.9–10.3)
Chloride: 91 mmol/L — ABNORMAL LOW (ref 98–111)
Creatinine, Ser: 10.59 mg/dL — ABNORMAL HIGH (ref 0.44–1.00)
GFR, Estimated: 4 mL/min — ABNORMAL LOW (ref >60)
Glucose, Bld: 95 mg/dL (ref 70–99)
Potassium: 4 mmol/L (ref 3.5–5.1)
Sodium: 134 mmol/L — ABNORMAL LOW (ref 135–145)

## 2021-05-16 SURGERY — CARDIOVERSION
Anesthesia: General

## 2021-05-16 MED ORDER — SODIUM CHLORIDE 0.9 % IV SOLN: INTRAVENOUS | Status: DC

## 2021-05-16 MED ORDER — LIDOCAINE HCL (CARDIAC) PF 100 MG/5ML IV SOSY
PREFILLED_SYRINGE | INTRAVENOUS | Status: DC | PRN
  Administered 2021-05-16: 40 mg via INTRAVENOUS

## 2021-05-16 MED ORDER — PROPOFOL 10 MG/ML IV BOLUS
INTRAVENOUS | Status: DC | PRN
  Administered 2021-05-16: 60 mg via INTRAVENOUS

## 2021-05-16 NOTE — Discharge Instructions (Signed)

## 2021-05-16 NOTE — CV Procedure (Signed)
° ° °  Cardioversion Note  Gloria Gallagher [id number] [birth year]  Procedure: DC Cardioversion Indications: atrial fib   Procedure Details Consent: Obtained Time Out: Verified patient identification, verified procedure, site/side was marked, verified correct patient position, special equipment/implants available, Radiology Safety Procedures followed,  medications/allergies/relevent history reviewed, required imaging and test results available.  Performed  The patient has been on adequate anticoagulation.  The patient received IV Lidocaine 40 mg IV followed by Proporol 60 mg IV  for sedation.  Synchronous cardioversion was performed at  200  joules.  The cardioversion was successful     Complications: No apparent complications Patient did tolerate procedure well.   Thayer Headings, Brooke Bonito., MD, [facility] 05/16/2021, 10:59 AM

## 2021-05-16 NOTE — Anesthesia Postprocedure Evaluation (Signed)
Anesthesia Post Note  Patient: [place] Gallagher  Procedure(s) Performed: CARDIOVERSION     Patient location during evaluation: PACU Anesthesia Type: General Level of consciousness: awake and alert Pain management: pain level controlled Vital Signs Assessment: post-procedure vital signs reviewed and stable Respiratory status: spontaneous breathing, nonlabored ventilation, respiratory function stable and patient connected to nasal cannula oxygen Cardiovascular status: blood pressure returned to baseline and stable Postop Assessment: no apparent nausea or vomiting Anesthetic complications: no   No notable events documented.  Last Vitals:  Vitals:   05/16/21 1124 05/16/21 1133  BP: (!) 132/53 135/73  Pulse: 78   Resp: 16 (!) 29  Temp:    SpO2: 98%     Last Pain:  Vitals:   05/16/21 1133  TempSrc:   PainSc: 0-No pain                 Effie Berkshire

## 2021-05-16 NOTE — Anesthesia Preprocedure Evaluation (Addendum)
Anesthesia Evaluation  Patient identified by MRN, date of birth, ID band Patient awake    Reviewed: Allergy & Precautions, NPO status , Patient's Chart, lab work & pertinent test results  Airway Mallampati: II  TM Distance: >3 FB Neck ROM: Full    Dental  (+) Poor Dentition, Loose, Missing, Dental Advisory Given,    Pulmonary former smoker,    breath sounds clear to auscultation       Cardiovascular negative cardio ROS   Rhythm:Irregular Rate:Normal     Neuro/Psych negative neurological ROS  negative psych ROS   GI/Hepatic negative GI ROS, Neg liver ROS,   Endo/Other  negative endocrine ROS  Renal/GU ESRF and DialysisRenal disease     Musculoskeletal  (+) Arthritis ,   Abdominal   Peds  Hematology negative hematology ROS (+)   Anesthesia Other Findings   Reproductive/Obstetrics                            Anesthesia Physical Anesthesia Plan  ASA: 3  Anesthesia Plan: General   Post-op Pain Management:    Induction: Intravenous  PONV Risk Score and Plan: 0  Airway Management Planned:   Additional Equipment: None  Intra-op Plan:   Post-operative Plan:   Informed Consent: I have reviewed the patients History and Physical, chart, labs and discussed the procedure including the risks, benefits and alternatives for the proposed anesthesia with the patient or authorized representative who has indicated his/her understanding and acceptance.       Plan Discussed with: CRNA  Anesthesia Plan Comments:        Anesthesia Quick Evaluation

## 2021-05-16 NOTE — Transfer of Care (Signed)
Immediate Anesthesia Transfer of Care Note  Patient: [place] Gallagher  Procedure(s) Performed: CARDIOVERSION  Patient Location: PACU  Anesthesia Type:General  Level of Consciousness: drowsy and patient cooperative  Airway & Oxygen Therapy: Patient Spontanous Breathing  Post-op Assessment: Report given to RN and Post -op Vital signs reviewed and stable  Post vital signs: Reviewed and stable  Last Vitals:  Vitals Value Taken Time  BP    Temp    Pulse    Resp    SpO2      Last Pain:  Vitals:   05/16/21 1026  TempSrc: Temporal  PainSc: 0-No pain         Complications: No notable events documented.

## 2021-05-16 NOTE — Interval H&P Note (Signed)
History and Physical Interval Note:  05/16/2021 10:30 AM  G[place]ia Gallagher  has presented today for surgery, with the diagnosis of A-FIB.  The various methods of treatment have been discussed with the patient and family. After consideration of risks, benefits and other options for treatment, the patient has consented to  Procedure(s): CARDIOVERSION (N/A) as a surgical intervention.  The patient's history has been reviewed, patient examined, no change in status, stable for surgery.  I have reviewed the patient's chart and labs.  Questions were answered to the patient's satisfaction.     Mertie Moores

## 2021-05-18 ENCOUNTER — Encounter (HOSPITAL_COMMUNITY): Payer: Self-pay | Admitting: Cardiovascular Disease

## 2021-05-23 ENCOUNTER — Encounter (HOSPITAL_COMMUNITY): Payer: Self-pay

## 2021-05-23 ENCOUNTER — Ambulatory Visit (HOSPITAL_COMMUNITY): Payer: Medicare Other | Admitting: Physician Assistant

## 2021-05-23 NOTE — Progress Notes (Incomplete)
Primary Care Physician: Default, Provider, MD Primary Cardiologist: none Primary Electrophysiologist: none Referring Physician: [facility]   G[place]ia Gallagher is a 71 y.o. female with a history of ESRD, aortic atherosclerosis, carotid artery disease, and atrial fibrillation who presents for follow up in the [facility].  The patient was initially diagnosed with atrial fibrillation after presenting to urgent care for a wishy-washy sound in her right ear.  A new heart murmur was noted with concern for referred murmur and carotid region with possible bruit.  She had an EKG done which did show A. fib without history of same and was advised to go to the ED for further evaluation. CTA neck unremarkable for dissection. She was diagnosed with acute otitis media and was started on doxycycline. She was unaware of her arrhythmia. Patient was started on Eliquis for a CHADS2VASC score of 3. In hindsight, patient has noted increased exercise intolerance for the previous 3 months.   On follow up today, patient is s/p DCCV on 05/16/21. ***  Today, she denies symptoms of ***palpitations, chest pain, shortness of breath, orthopnea, PND, lower extremity edema, dizziness, presyncope, syncope, snoring, daytime somnolence, bleeding, or neurologic sequela. The patient is tolerating medications without difficulties and is otherwise without complaint today.    Atrial Fibrillation Risk Factors:  she does not have symptoms or diagnosis of sleep apnea. she does not have a history of rheumatic fever. she does not have a history of alcohol use. The patient does have a history of early familial atrial fibrillation or other arrhythmias. Two sisters have afib.  she has a BMI of There is no height or weight on file to calculate BMI.. There were no vitals filed for this visit.   No family history on file.   Atrial Fibrillation Management history:  Previous antiarrhythmic drugs:  none Previous cardioversions: 05/16/21 Previous ablations: none CHADS2VASC score: 3 Anticoagulation history: 3   Past Medical History:  Diagnosis Date   Aortic atherosclerosis (HCC)    Arthritis    Atrial fibrillation (HCC)    CAD (coronary artery disease)    Dialysis patient ([place]    ESRD (end stage renal disease) ([place])    Gout    Heart murmur    Renal disorder    Past Surgical History:  Procedure Laterality Date   CARDIOVERSION N/A 05/16/2021   Procedure: CARDIOVERSION;  Surgeon: Thayer Headings, MD;  Location: [facility] ENDOSCOPY;  Service: Cardiovascular;  Laterality: N/A;    Current Outpatient Medications  Medication Sig Dispense Refill   apixaban (ELIQUIS) 5 MG TABS tablet Take 1 tablet (5 mg total) by mouth 2 (two) times daily. 60 tablet 3   doxycycline (VIBRAMYCIN) 100 MG capsule Take 1 capsule (100 mg total) by mouth 2 (two) times daily. (Patient not taking: Reported on 05/04/2021) 20 capsule 0   ferric citrate (AURYXIA) 1 GM 210 MG(Fe) tablet Take 630 mg by mouth 3 (three) times daily with meals.     gabapentin (NEURONTIN) 100 MG capsule Take 1 capsule (100 mg total) by mouth at bedtime. (Patient not taking: Reported on 05/04/2021) 30 capsule 0   No current facility-administered medications for this visit.    Allergies  Allergen Reactions   Penicillin G Hives    Social History   Socioeconomic History   Marital status: Single    Spouse name: Not on file   Number of children: Not on file   Years of education: Not on file   Highest education level: Not on  file  Occupational History   Not on file  Tobacco Use   Smoking status: Former    Types: Cigarettes    Passive exposure: Never   Smokeless tobacco: Never   Tobacco comments:    Former smoker 04/25/2021  Vaping Use   Vaping Use: Never used  Substance and Sexual Activity   Alcohol use: Not Currently    Comment: occasionally   Drug use: Never   Sexual activity: Not Currently  Other Topics Concern   Not on  file  Social History Narrative   Not on file   Social Determinants of Health   Financial Resource Strain: Not on file  Food Insecurity: Not on file  Transportation Needs: Not on file  Physical Activity: Not on file  Stress: Not on file  Social Connections: Not on file  Intimate Partner Violence: Not on file     ROS- All systems are reviewed and negative except as per the HPI above.  Physical Exam: There were no vitals filed for this visit.  GEN- The patient is a well appearing *** {Desc; female/female:11659}, alert and oriented x 3 today.   HEENT-head normocephalic, atraumatic, sclera clear, conjunctiva pink, hearing intact, trachea midline. Lungs- Clear to ausculation bilaterally, normal work of breathing Heart- ***Regular rate and rhythm, no murmurs, rubs or gallops  GI- soft, NT, ND, + BS Extremities- no clubbing, cyanosis, or edema MS- no significant deformity or atrophy Skin- no rash or lesion Psych- euthymic mood, full affect Neuro- strength and sensation are intact   Wt Readings from Last 3 Encounters:  05/16/21 74.8 kg  04/25/21 75.2 kg  02/06/21 79.4 kg    EKG today demonstrates  ***   Epic records are reviewed at length today  CHA2DS2-VASc Score = 3  The patient's score is based upon: CHF History: 0 HTN History: 0 Diabetes History: 0 Stroke History: 0 Vascular Disease History: 1 (aortic atherosclerosis, carotid disease) Age Score: 1 Gender Score: 1       ASSESSMENT AND PLAN: 1. Persistent Atrial Fibrillation (ICD10:  I48.19) The patient's CHA2DS2-VASc score is 3, indicating a 3.2% annual risk of stroke.   S/p DCCV on 05/16/21 *** Continue Eliquis 5 mg BID Echocardiogram scheduled.   2. Secondary Hypercoagulable State (ICD10:  D68.69) The patient is at significant risk for stroke/thromboembolism based upon her CHA2DS2-VASc Score of 3.  Continue Apixaban (Eliquis).   3. ESRD HD on M/W/F    Follow up ***   Adline Peals PA-C Afib  [facility] [place], [place] [address] [phone] 05/23/2021 8:30 AM

## 2021-05-31 NOTE — Progress Notes (Deleted)
CARDIOLOGY CONSULT NOTE       Patient ID: [place] Gallagher MRN: [id number] DOB/AGE: [birth year] 73 y.o.  Admit date: (Not on file) Referring Physician: *** Primary Physician: Default, Provider, MD Primary Cardiologist: *** Reason for Consultation: ***  Active Problems:   * No active hospital problems. *   HPI:  ***  ROS All other systems reviewed and negative except as noted above  Past Medical History:  Diagnosis Date   Aortic atherosclerosis (HCC)    Arthritis    Atrial fibrillation (HCC)    CAD (coronary artery disease)    Dialysis patient ([place])    ESRD (end stage renal disease) ([place])    Gout    Heart murmur    Renal disorder     No family history on file.  Social History   Socioeconomic History   Marital status: Single    Spouse name: Not on file   Number of children: Not on file   Years of education: Not on file   Highest education level: Not on file  Occupational History   Not on file  Tobacco Use   Smoking status: Former    Types: Cigarettes    Passive exposure: Never   Smokeless tobacco: Never   Tobacco comments:    Former smoker 04/25/2021  Vaping Use   Vaping Use: Never used  Substance and Sexual Activity   Alcohol use: Not Currently    Comment: occasionally   Drug use: Never   Sexual activity: Not Currently  Other Topics Concern   Not on file  Social History Narrative   Not on file   Social Determinants of Health   Financial Resource Strain: Not on file  Food Insecurity: Not on file  Transportation Needs: Not on file  Physical Activity: Not on file  Stress: Not on file  Social Connections: Not on file  Intimate Partner Violence: Not on file    Past Surgical History:  Procedure Laterality Date   CARDIOVERSION N/A 05/16/2021   Procedure: CARDIOVERSION;  Surgeon: Nahser, Wonda Cheng, MD;  Location: [facility];  Service: Cardiovascular;  Laterality: N/A;      Current Outpatient Medications:    apixaban (ELIQUIS) 5 MG TABS tablet,  Take 1 tablet (5 mg total) by mouth 2 (two) times daily., Disp: 60 tablet, Rfl: 3   doxycycline (VIBRAMYCIN) 100 MG capsule, Take 1 capsule (100 mg total) by mouth 2 (two) times daily. (Patient not taking: Reported on 05/04/2021), Disp: 20 capsule, Rfl: 0   ferric citrate (AURYXIA) 1 GM 210 MG(Fe) tablet, Take 630 mg by mouth 3 (three) times daily with meals., Disp: , Rfl:    gabapentin (NEURONTIN) 100 MG capsule, Take 1 capsule (100 mg total) by mouth at bedtime. (Patient not taking: Reported on 05/04/2021), Disp: 30 capsule, Rfl: 0    Physical Exam: There were no vitals taken for this visit. *** HELP TEXT ***  This SmartLink requires parameters. Parameters are variables that are added to the [facility] name to request specific information. The parameter for .curwt is the number of encounters to display readings from.  For example: .curwt[4  In this example, the SmartLink displays readings from the last four encounters.    {physical [id number]}  Labs:   Lab Results  Component Value Date   WBC 4.2 05/16/2021   HGB 11.9 (L) 05/16/2021   HCT 37.4 05/16/2021   MCV 98.4 05/16/2021   PLT 157 05/16/2021   No results for input(s): NA, K, CL, CO2, BUN, CREATININE, CALCIUM, PROT, BILITOT,  ALKPHOS, ALT, AST, GLUCOSE in the last 168 hours.  Invalid input(s): LABALBU No results found for: CKTOTAL, CKMB, CKMBINDEX, TROPONINI No results found for: CHOL No results found for: HDL No results found for: LDLCALC No results found for: TRIG No results found for: CHOLHDL No results found for: LDLDIRECT    Radiology: No results found.  EKG: ***   ASSESSMENT AND PLAN:    Signed: Jenkins Rouge 05/31/2021, 9:35 AM

## 2021-06-05 ENCOUNTER — Telehealth: Payer: Self-pay | Admitting: Cardiovascular Disease

## 2021-06-05 NOTE — Telephone Encounter (Signed)
Patient has been contacted 2x as of today to reschedule appt on 06/06/21. Patient needs to be rescheduled with Dr. Acie Fredrickson beings that he did her cardioversion. LVM both times.

## 2021-06-06 ENCOUNTER — Ambulatory Visit: Payer: Commercial Managed Care - HMO | Admitting: Cardiovascular Disease

## 2021-06-06 ENCOUNTER — Ambulatory Visit (HOSPITAL_COMMUNITY): Admission: RE | Admit: 2021-06-06 | Payer: Medicare Other | Source: Ambulatory Visit

## 2021-06-20 ENCOUNTER — Ambulatory Visit (HOSPITAL_COMMUNITY)
Admission: RE | Admit: 2021-06-20 | Discharge: 2021-06-20 | Disposition: A | Discharge status: Home / Self Care | Payer: Medicare Other | Source: Ambulatory Visit | Attending: Physician Assistant | Admitting: Physician Assistant

## 2021-06-20 ENCOUNTER — Other Ambulatory Visit: Payer: Self-pay

## 2021-06-20 ENCOUNTER — Encounter (HOSPITAL_COMMUNITY): Payer: Self-pay | Admitting: Physician Assistant

## 2021-06-20 VITALS — BP 132/72 | HR 87 | Ht 66.0 in | Wt 163.4 lb

## 2021-06-20 DIAGNOSIS — Z992 Dependence on renal dialysis: Secondary | ICD-10-CM | POA: Insufficient documentation

## 2021-06-20 DIAGNOSIS — Z7901 Long term (current) use of anticoagulants: Secondary | ICD-10-CM | POA: Diagnosis not present

## 2021-06-20 DIAGNOSIS — I4819 Other persistent atrial fibrillation: Secondary | ICD-10-CM

## 2021-06-20 DIAGNOSIS — R011 Cardiac murmur, unspecified: Secondary | ICD-10-CM | POA: Insufficient documentation

## 2021-06-20 DIAGNOSIS — I2510 Atherosclerotic heart disease of native coronary artery without angina pectoris: Secondary | ICD-10-CM | POA: Insufficient documentation

## 2021-06-20 DIAGNOSIS — I700 Atherosclerosis of aorta: Secondary | ICD-10-CM | POA: Diagnosis not present

## 2021-06-20 DIAGNOSIS — D6869 Other thrombophilia: Secondary | ICD-10-CM | POA: Diagnosis not present

## 2021-06-20 DIAGNOSIS — N186 End stage renal disease: Secondary | ICD-10-CM | POA: Insufficient documentation

## 2021-06-20 DIAGNOSIS — Z8249 Family history of ischemic heart disease and other diseases of the circulatory system: Secondary | ICD-10-CM | POA: Insufficient documentation

## 2021-06-20 LAB — TSH: TSH: 1.382 u[IU]/mL (ref 0.350–4.500)

## 2021-06-20 LAB — COMPREHENSIVE METABOLIC PANEL
ALT: 15 U/L (ref 0–44)
AST: 19 U/L (ref 15–41)
Albumin: 3.4 g/dL — ABNORMAL LOW (ref 3.5–5.0)
Alkaline Phosphatase: 111 U/L (ref 38–126)
Anion gap: 13 (ref 5–15)
BUN: 56 mg/dL — ABNORMAL HIGH (ref 8–23)
CO2: 30 mmol/L (ref 22–32)
Calcium: 10.2 mg/dL (ref 8.9–10.3)
Chloride: 95 mmol/L — ABNORMAL LOW (ref 98–111)
Creatinine, Ser: 8.66 mg/dL — ABNORMAL HIGH (ref 0.44–1.00)
GFR, Estimated: 4 mL/min — ABNORMAL LOW (ref >60)
Glucose, Bld: 100 mg/dL — ABNORMAL HIGH (ref 70–99)
Potassium: 4.2 mmol/L (ref 3.5–5.1)
Sodium: 138 mmol/L (ref 135–145)
Total Bilirubin: 0.6 mg/dL (ref 0.3–1.2)
Total Protein: 6.3 g/dL — ABNORMAL LOW (ref 6.5–8.1)

## 2021-06-20 MED ORDER — APIXABAN 5 MG PO TABS: 5.0000 mg | ORAL_TABLET | Freq: Two times a day (BID) | ORAL | 11 refills | Status: DC

## 2021-06-20 NOTE — Progress Notes (Signed)
Primary Care Physician: Default, Provider, MD Primary Cardiologist: none Primary Electrophysiologist: none Referring Physician: [facility]   Gloria Gallagher is a 71 y.o. female with a history of ESRD, aortic atherosclerosis, carotid artery disease, and atrial fibrillation who presents for follow up in the [facility].  The patient was initially diagnosed with atrial fibrillation after presenting to urgent care for a wishy-washy sound in her right ear.  A new heart murmur was noted with concern for referred murmur and carotid region with possible bruit.  She had an EKG done which did show A. fib without history of same and was advised to go to the ED for further evaluation. CTA neck unremarkable for dissection. She was diagnosed with acute otitis media and was started on doxycycline. She was unaware of her arrhythmia. Patient was started on Eliquis for a CHADS2VASC score of 3. In hindsight, patient has noted increased exercise intolerance for the previous 3 months.   On follow up today, patient is s/p DCCV on 05/16/21. She is back in rate controlled afib today. She does report that after her DCCV she did have more energy and less SOB. She has not been on Eliquis since DCCV because she ran out of the medication and did not get it refilled.   Today, she denies symptoms of palpitations, chest pain, shortness of breath, orthopnea, PND, lower extremity edema, dizziness, presyncope, syncope, snoring, daytime somnolence, bleeding, or neurologic sequela. The patient is tolerating medications without difficulties and is otherwise without complaint today.    Atrial Fibrillation Risk Factors:  she does not have symptoms or diagnosis of sleep apnea. she does not have a history of rheumatic fever. she does not have a history of alcohol use. The patient does have a history of early familial atrial fibrillation or other arrhythmias. Two sisters have afib.  she has a BMI of Body  mass index is 26.37 kg/m.Marland Kitchen Filed Weights   06/20/21 1036  Weight: 74.1 kg     No family history on file.   Atrial Fibrillation Management history:  Previous antiarrhythmic drugs: none Previous cardioversions: 05/16/21 Previous ablations: none CHADS2VASC score: 3 Anticoagulation history: Eliquis   Past Medical History:  Diagnosis Date   Aortic atherosclerosis (HCC)    Arthritis    Atrial fibrillation (HCC)    CAD (coronary artery disease)    Dialysis patient ([place])    ESRD (end stage renal disease) ([place])    Gout    Heart murmur    Renal disorder    Past Surgical History:  Procedure Laterality Date   CARDIOVERSION N/A 05/16/2021   Procedure: CARDIOVERSION;  Surgeon: Nahser, Wonda Cheng, MD;  Location: [facility];  Service: Cardiovascular;  Laterality: N/A;    Current Outpatient Medications  Medication Sig Dispense Refill   colchicine 0.6 MG tablet Take 0.6 mg by mouth as needed.     ferric citrate (AURYXIA) 1 GM 210 MG(Fe) tablet Take 630 mg by mouth 3 (three) times daily with meals.     apixaban (ELIQUIS) 5 MG TABS tablet Take 1 tablet (5 mg total) by mouth 2 (two) times daily. 60 tablet 11   No current facility-administered medications for this encounter.    Allergies  Allergen Reactions   Penicillin G Hives    Social History   Socioeconomic History   Marital status: Single    Spouse name: Not on file   Number of children: Not on file   Years of education: Not on file   Highest  education level: Not on file  Occupational History   Not on file  Tobacco Use   Smoking status: Former    Types: Cigarettes    Passive exposure: Never   Smokeless tobacco: Never   Tobacco comments:    Former smoker 04/25/2021  Vaping Use   Vaping Use: Never used  Substance and Sexual Activity   Alcohol use: Yes    Alcohol/week: 1.0 standard drink    Types: 1 Cans of beer per week    Comment: 1 beer every other week 06/20/21   Drug use: Never   Sexual activity: Not  Currently  Other Topics Concern   Not on file  Social History Narrative   Not on file   Social Determinants of Health   Financial Resource Strain: Not on file  Food Insecurity: Not on file  Transportation Needs: Not on file  Physical Activity: Not on file  Stress: Not on file  Social Connections: Not on file  Intimate Partner Violence: Not on file     ROS- All systems are reviewed and negative except as per the HPI above.  Physical Exam: Vitals:   06/20/21 1036  BP: 132/72  Pulse: 87  Weight: 74.1 kg  Height: 5\' 6"  (1.676 m)    GEN- The patient is a well appearing female, alert and oriented x 3 today.   HEENT-head normocephalic, atraumatic, sclera clear, conjunctiva pink, hearing intact, trachea midline. Lungs- Clear to ausculation bilaterally, normal work of breathing Heart- irregular rate and rhythm, no murmurs, rubs or gallops  GI- soft, NT, ND, + BS Extremities- no clubbing, cyanosis, or edema MS- no significant deformity or atrophy Skin- no rash or lesion Psych- euthymic mood, full affect Neuro- strength and sensation are intact   Wt Readings from Last 3 Encounters:  06/20/21 74.1 kg  05/16/21 74.8 kg  04/25/21 75.2 kg    EKG today demonstrates  Afib Vent. rate 87 BPM PR interval * ms QRS duration 96 ms QT/QTcB 376/452 ms   Epic records are reviewed at length today  CHA2DS2-VASc Score = 3  The patient's score is based upon: CHF History: 0 HTN History: 0 Diabetes History: 0 Stroke History: 0 Vascular Disease History: 1 (aortic atherosclerosis, carotid disease) Age Score: 1 Gender Score: 1       ASSESSMENT AND PLAN: 1. Persistent Atrial Fibrillation (ICD10:  I48.19) The patient's CHA2DS2-VASc score is 3, indicating a 3.2% annual risk of stroke.   S/p DCCV on 05/16/21 with early return of afib. Since she did have symptomatic improvement post DCCV, would continue to pursue a rhythm control strategy. Options are limited with ESRD.  Will plan to  start amiodarone once she has been on anticoagulation for 3 weeks so she does not prematurely chemically convert. Check TSH/cmet today. Resume Eliquis 5 mg BID Echocardiogram rescheduled.   2. Secondary Hypercoagulable State (ICD10:  D68.69) The patient is at significant risk for stroke/thromboembolism based upon her CHA2DS2-VASc Score of 3.  Continue Apixaban (Eliquis).   3. ESRD HD on M/W/F    Follow up with Dr Acie Fredrickson as scheduled. AF clinic in 3 weeks.    [facility] [address] [place], [place] [address] [phone] 06/20/2021 12:12 PM

## 2021-06-27 ENCOUNTER — Encounter: Payer: Self-pay | Admitting: Cardiovascular Disease

## 2021-06-27 NOTE — Progress Notes (Signed)
Cardiology Office Note:    Date:  06/28/2021   ID:  Gloria Gallagher, DOB 1951-01-08, MRN [id number]  PCP:  Default, Provider, MD   [facility] Providers Cardiologist:  Mathan Darroch  }    Referring MD: No ref. provider found   Chief Complaint  Patient presents with   Atrial Fibrillation         Feb. 15, 2023   Gloria Gallagher is a 71 y.o. female with a hx of ESRD, aortic sclerosis, carotid artery disease, atrial fib She is s/p  successful cardioversion  Doing well from a cardiac standpoint Feeling well after cardioversion  Some DOE climbing stairs  Has been on dialysis > 10 years .   ECG today shows she is back in afib       Past Medical History:  Diagnosis Date   Aortic atherosclerosis (HCC)    Arthritis    Atrial fibrillation (HCC)    CAD (coronary artery disease)    Dialysis patient ([place])    ESRD (end stage renal disease) ([place])    Gout    Heart murmur    Renal disorder     Past Surgical History:  Procedure Laterality Date   CARDIOVERSION N/A 05/16/2021   Procedure: CARDIOVERSION;  Surgeon: Thayer Headings, MD;  Location: [facility];  Service: Cardiovascular;  Laterality: N/A;    Current Medications: Current Meds  Medication Sig   apixaban (ELIQUIS) 5 MG TABS tablet Take 1 tablet (5 mg total) by mouth 2 (two) times daily.   ferric citrate (AURYXIA) 1 GM 210 MG(Fe) tablet Take 630 mg by mouth 3 (three) times daily with meals.     Allergies:   Penicillin g   Social History   Socioeconomic History   Marital status: Single    Spouse name: Not on file   Number of children: Not on file   Years of education: Not on file   Highest education level: Not on file  Occupational History   Not on file  Tobacco Use   Smoking status: Former    Types: Cigarettes    Passive exposure: Never   Smokeless tobacco: Never   Tobacco comments:    Former smoker 04/25/2021  Vaping Use   Vaping Use: Never used  Substance and Sexual Activity   Alcohol use: Yes     Alcohol/week: 1.0 standard drink    Types: 1 Cans of beer per week    Comment: 1 beer every other week 06/20/21   Drug use: Never   Sexual activity: Not Currently  Other Topics Concern   Not on file  Social History Narrative   Not on file   Social Determinants of Health   Financial Resource Strain: Not on file  Food Insecurity: Not on file  Transportation Needs: Not on file  Physical Activity: Not on file  Stress: Not on file  Social Connections: Not on file     Family History: The patient's family history is not on file.  ROS:   Please see the history of present illness.     All other systems reviewed and are negative.  EKGs/Labs/Other Studies Reviewed:    The following studies were reviewed today:   EKG:  Feb. 15, 2023  Atrial fib with rate of 84   Recent Labs: 04/18/2021: B Natriuretic Peptide 464.9; Magnesium 2.4 05/16/2021: Hemoglobin 11.9; Platelets 157 06/20/2021: ALT 15; BUN 56; Creatinine, Ser 8.66; Potassium 4.2; Sodium 138; TSH 1.382  Recent Lipid Panel No results found for: CHOL, TRIG, HDL, CHOLHDL, VLDL,  LDLCALC, LDLDIRECT   Risk Assessment/Calculations:    CHA2DS2-VASc Score = 3   This indicates a 3.2% annual risk of stroke. The patient's score is based upon: CHF History: 0 HTN History: 0 Diabetes History: 0 Stroke History: 0 Vascular Disease History: 1 (aortic atherosclerosis, carotid disease) Age Score: 1 Gender Score: 1       Physical Exam:    VS:  BP 138/70    Pulse 86    Ht 5\' 3"  (1.6 m)    Wt 163 lb 6.4 oz (74.1 kg)    BMI 28.95 kg/m     Wt Readings from Last 3 Encounters:  06/28/21 163 lb 6.4 oz (74.1 kg)  06/20/21 163 lb 6.4 oz (74.1 kg)  05/16/21 165 lb (74.8 kg)     GEN:  Well nourished, well developed in no acute distress HEENT: Normal NECK: No JVD; No carotid bruits LYMPHATICS: No lymphadenopathy CARDIAC:  irreg. Irreg.  + systlic and diastolic murmurs  RESPIRATORY:  Clear to auscultation without rales, wheezing or  rhonchi  ABDOMEN: Soft, non-tender, non-distended MUSCULOSKELETAL:  No edema; No deformity  SKIN: Warm and dry NEUROLOGIC:  Alert and oriented x 3 PSYCHIATRIC:  Normal affect   ASSESSMENT:    No diagnosis found. PLAN:       Atrial fib:  she is back on Afib .  Will load her with amiodarone.   Have her see the Afib clinic in 4-6 weeks and schedule a cardioversion if she is still in afib .   Echo scheduled for next week .   TSH is normal                  Medication Adjustments/Labs and Tests Ordered: Current medicines are reviewed at length with the patient today.  Concerns regarding medicines are outlined above.  No orders of the defined types were placed in this encounter.  No orders of the defined types were placed in this encounter.   There are no Patient Instructions on file for this visit.   Signed, Mertie Moores, MD  06/28/2021 4:09 PM    [facility]

## 2021-06-28 ENCOUNTER — Ambulatory Visit (INDEPENDENT_AMBULATORY_CARE_PROVIDER_SITE_OTHER): Payer: Medicare Other | Admitting: Cardiovascular Disease

## 2021-06-28 ENCOUNTER — Encounter: Payer: Self-pay | Admitting: Cardiovascular Disease

## 2021-06-28 ENCOUNTER — Other Ambulatory Visit: Payer: Self-pay

## 2021-06-28 VITALS — BP 138/70 | HR 86 | Ht 63.0 in | Wt 163.4 lb

## 2021-06-28 DIAGNOSIS — I4819 Other persistent atrial fibrillation: Secondary | ICD-10-CM | POA: Diagnosis not present

## 2021-06-28 MED ORDER — AMIODARONE HCL 200 MG PO TABS: 200.0000 mg | ORAL_TABLET | Freq: Two times a day (BID) | ORAL | 1 refills | Status: DC

## 2021-06-28 NOTE — Patient Instructions (Signed)
Medication Instructions:  Please start Amiodarone 200 mg - take one tablet twice a day for 2 weeks then Decrease to 200 mg one tablet daily. Continue all other medications as listed.  *If you need a refill on your cardiac medications before your next appointment, please call your pharmacy*  Testing/Procedures: Have echocardiogram as scheduled.   Follow-Up: At [facility], you and your health needs are our priority.  As part of our continuing mission to provide you with exceptional heart care, we have created designated Provider Care Teams.  These Care Teams include your primary Cardiologist (physician) and Advanced Practice Providers (APPs -  Physician Assistants and Nurse Practitioners) who all work together to provide you with the care you need, when you need it.  We recommend signing up for the patient portal called "MyChart".  Sign up information is provided on this After Visit Summary.  MyChart is used to connect with patients for Virtual Visits (Telemedicine).  Patients are able to view lab/test results, encounter notes, upcoming appointments, etc.  Non-urgent messages can be sent to your provider as well.   To learn more about what you can do with MyChart, go to [email or web address].    Your next appointment:   4 -6 week(s)  The format for your next appointment:   In Person  Provider:   You will follow up in the [facility] located at [facility]. Your provider will be: Clint R. Fenton, PA-C{   Thank you for choosing [facility]!!

## 2021-07-04 ENCOUNTER — Other Ambulatory Visit: Payer: Self-pay

## 2021-07-04 ENCOUNTER — Ambulatory Visit (HOSPITAL_COMMUNITY)
Admission: RE | Admit: 2021-07-04 | Discharge: 2021-07-04 | Disposition: A | Discharge status: Home / Self Care | Payer: Medicare Other | Source: Ambulatory Visit | Attending: Physician Assistant | Admitting: Physician Assistant

## 2021-07-04 DIAGNOSIS — I4819 Other persistent atrial fibrillation: Secondary | ICD-10-CM | POA: Insufficient documentation

## 2021-07-04 DIAGNOSIS — Q231 Congenital insufficiency of aortic valve: Secondary | ICD-10-CM | POA: Insufficient documentation

## 2021-07-04 DIAGNOSIS — I2510 Atherosclerotic heart disease of native coronary artery without angina pectoris: Secondary | ICD-10-CM | POA: Diagnosis not present

## 2021-07-04 DIAGNOSIS — I7121 Aneurysm of the ascending aorta, without rupture: Secondary | ICD-10-CM | POA: Insufficient documentation

## 2021-07-04 LAB — ECHOCARDIOGRAM COMPLETE
P 1/2 time: 331 msec
S' Lateral: 3.6 cm

## 2021-07-11 ENCOUNTER — Ambulatory Visit (HOSPITAL_COMMUNITY)
Admission: RE | Admit: 2021-07-11 | Discharge: 2021-07-11 | Disposition: A | Discharge status: Home / Self Care | Payer: Medicare Other | Source: Ambulatory Visit | Attending: Physician Assistant | Admitting: Physician Assistant

## 2021-07-11 ENCOUNTER — Encounter (HOSPITAL_COMMUNITY): Payer: Self-pay | Admitting: Physician Assistant

## 2021-07-11 ENCOUNTER — Other Ambulatory Visit: Payer: Self-pay

## 2021-07-11 VITALS — BP 146/70 | HR 66 | Ht 63.0 in | Wt 164.8 lb

## 2021-07-11 DIAGNOSIS — I7121 Aneurysm of the ascending aorta, without rupture: Secondary | ICD-10-CM | POA: Diagnosis not present

## 2021-07-11 DIAGNOSIS — N186 End stage renal disease: Secondary | ICD-10-CM | POA: Insufficient documentation

## 2021-07-11 DIAGNOSIS — I519 Heart disease, unspecified: Secondary | ICD-10-CM | POA: Diagnosis not present

## 2021-07-11 DIAGNOSIS — Z992 Dependence on renal dialysis: Secondary | ICD-10-CM | POA: Diagnosis not present

## 2021-07-11 DIAGNOSIS — I719 Aortic aneurysm of unspecified site, without rupture: Secondary | ICD-10-CM | POA: Diagnosis not present

## 2021-07-11 DIAGNOSIS — Z7901 Long term (current) use of anticoagulants: Secondary | ICD-10-CM | POA: Insufficient documentation

## 2021-07-11 DIAGNOSIS — D6869 Other thrombophilia: Secondary | ICD-10-CM | POA: Diagnosis not present

## 2021-07-11 DIAGNOSIS — I4819 Other persistent atrial fibrillation: Secondary | ICD-10-CM

## 2021-07-11 NOTE — Patient Instructions (Signed)
Scheduling will contact you for Cardiac MRI  Cardioversion scheduled for Tuesday, March 21st  - Come to clinic for labs at [place] at the [facility] and go to admitting at [place] not eat or drink anything after midnight the night prior to your procedure.  - Take all your morning medication (except diabetic medications) with a sip of water prior to arrival.  - You will not be able to drive home after your procedure.  - Do NOT miss any doses of your blood thinner - if you should miss a dose please notify our office immediately.  - If you feel as if you go back into normal rhythm prior to scheduled cardioversion, please notify our office immediately. If your procedure is canceled in the cardioversion suite you will be charged a cancellation fee.

## 2021-07-11 NOTE — Progress Notes (Signed)
Primary Care Physician: Default, Provider, MD Primary Cardiologist: Dr Acie Fredrickson Primary Electrophysiologist: none Referring Physician: [facility]   Gloria Gallagher is a 71 y.o. female with a history of ESRD, aortic atherosclerosis, carotid artery disease, and atrial fibrillation who presents for follow up in the [facility].  The patient was initially diagnosed with atrial fibrillation after presenting to urgent care for a wishy-washy sound in her right ear.  A new heart murmur was noted with concern for referred murmur and carotid region with possible bruit.  She had an EKG done which did show A. fib without history of same and was advised to go to the ED for further evaluation. CTA neck unremarkable for dissection. She was diagnosed with acute otitis media and was started on doxycycline. She was unaware of her arrhythmia. Patient was started on Eliquis for a CHADS2VASC score of 3. In hindsight, patient has noted increased exercise intolerance for the previous 3 months.   Patient is s/p DCCV on 05/16/21 with early return of afib. She was started on amiodarone on 06/28/21.  On follow up today, patient does report that she feels a little improved since starting amiodarone but she does still have dyspnea with exertion. Her echo showed EF 55-60%, mod-severe AR, ascending aortic aneurysm 47 mm. She denies any bleeding issues on anticoagulation.   Today, she denies symptoms of palpitations, chest pain, shortness of breath, orthopnea, PND, lower extremity edema, dizziness, presyncope, syncope, snoring, daytime somnolence, bleeding, or neurologic sequela. The patient is tolerating medications without difficulties and is otherwise without complaint today.    Atrial Fibrillation Risk Factors:  she does not have symptoms or diagnosis of sleep apnea. she does not have a history of rheumatic fever. she does not have a history of alcohol use. The patient does have a history of  early familial atrial fibrillation or other arrhythmias. Two sisters have afib.  she has a BMI of Body mass index is 29.19 kg/m.Marland Kitchen Filed Weights   07/11/21 0946  Weight: 74.8 kg     No family history on file.   Atrial Fibrillation Management history:  Previous antiarrhythmic drugs: amiodarone  Previous cardioversions: 05/16/21 Previous ablations: none CHADS2VASC score: 3 Anticoagulation history: Eliquis   Past Medical History:  Diagnosis Date   Aortic atherosclerosis (HCC)    Arthritis    Atrial fibrillation (HCC)    CAD (coronary artery disease)    Dialysis patient ([place])    ESRD (end stage renal disease) ([place])    Gout    Heart murmur    Renal disorder    Past Surgical History:  Procedure Laterality Date   CARDIOVERSION N/A 05/16/2021   Procedure: CARDIOVERSION;  Surgeon: Thayer Headings, MD;  Location: [facility];  Service: Cardiovascular;  Laterality: N/A;    Current Outpatient Medications  Medication Sig Dispense Refill   amiodarone (PACERONE) 200 MG tablet Take 1 tablet (200 mg total) by mouth 2 (two) times daily. Take 200 mg twice a a day for 2 weeks then decrease to 200 mg daily 103 tablet 1   apixaban (ELIQUIS) 5 MG TABS tablet Take 1 tablet (5 mg total) by mouth 2 (two) times daily. 60 tablet 11   ferric citrate (AURYXIA) 1 GM 210 MG(Fe) tablet Take 630 mg by mouth 3 (three) times daily with meals.     No current facility-administered medications for this encounter.    Allergies  Allergen Reactions   Penicillin G Hives    Social History   Socioeconomic  History   Marital status: Single    Spouse name: Not on file   Number of children: Not on file   Years of education: Not on file   Highest education level: Not on file  Occupational History   Not on file  Tobacco Use   Smoking status: Former    Types: Cigarettes    Passive exposure: Never   Smokeless tobacco: Never   Tobacco comments:    Former smoker 04/25/2021  Vaping Use   Vaping Use:  Never used  Substance and Sexual Activity   Alcohol use: Yes    Alcohol/week: 1.0 standard drink    Types: 1 Cans of beer per week    Comment: 1 beer every other week 06/20/21   Drug use: Never   Sexual activity: Not Currently  Other Topics Concern   Not on file  Social History Narrative   Not on file   Social Determinants of Health   Financial Resource Strain: Not on file  Food Insecurity: Not on file  Transportation Needs: Not on file  Physical Activity: Not on file  Stress: Not on file  Social Connections: Not on file  Intimate Partner Violence: Not on file     ROS- All systems are reviewed and negative except as per the HPI above.  Physical Exam: Vitals:   07/11/21 0946  BP: (!) 146/70  Pulse: 66  Weight: 74.8 kg  Height: 5\' 3"  (1.6 m)    GEN- The patient is a well appearing female, alert and oriented x 3 today.   HEENT-head normocephalic, atraumatic, sclera clear, conjunctiva pink, hearing intact, trachea midline. Lungs- Clear to ausculation bilaterally, normal work of breathing Heart- irregular rate and rhythm, no rubs or gallops, 2/6 systolic and 2/6 diastolic murmur   GI- soft, NT, ND, + BS Extremities- no clubbing, cyanosis, or edema MS- no significant deformity or atrophy Skin- no rash or lesion Psych- euthymic mood, full affect Neuro- strength and sensation are intact   Wt Readings from Last 3 Encounters:  07/11/21 74.8 kg  06/28/21 74.1 kg  06/20/21 74.1 kg    EKG today demonstrates  Afib Vent. rate 66 BPM PR interval * ms QRS duration 112 ms QT/QTcB 458/480 ms  Echo 07/04/21  1. Left ventricular ejection fraction, by estimation, is 55 to 60%. The  left ventricle has normal function. The left ventricle has no regional  wall motion abnormalities. The left ventricular internal cavity size was  mildly dilated. There is moderate left ventricular hypertrophy. Left ventricular diastolic parameters are indeterminate.   2. Right ventricular  systolic function is mildly reduced. The right  ventricular size is normal. There is normal pulmonary artery systolic  pressure. The estimated right ventricular systolic pressure is 78.2 mmHg.   3. Left atrial size was severely dilated.   4. Right atrial size was moderately dilated.   5. The mitral valve is normal in structure. Mild mitral valve  regurgitation.   6. The aortic valve is bicuspid. Aortic valve regurgitation is moderate  to severe. Aortic valve sclerosis is present, with no evidence of aortic  valve stenosis.   7. The inferior vena cava is dilated in size with >50% respiratory  variability, suggesting right atrial pressure of 8 mmHg.   8. Aneurysm of the ascending aorta, measuring 47 mm.   Conclusion(s)/Recommendation(s): Ascending aortic aneurysm and moderate to severe AI. Consider cardiac MRI/MRA aorta to evaluate both aortic aneurysm and quantify AI severity.    Epic records are reviewed at length today  CHA2DS2-VASc Score = 3  The patient's score is based upon: CHF History: 0 HTN History: 0 Diabetes History: 0 Stroke History: 0 Vascular Disease History: 1 (aortic atherosclerosis, carotid disease) Age Score: 1 Gender Score: 1       ASSESSMENT AND PLAN: 1. Persistent Atrial Fibrillation (ICD10:  I48.19) The patient's CHA2DS2-VASc score is 3, indicating a 3.2% annual risk of stroke.   S/p DCCV on 05/16/21 with early return of afib. We discussed rhythm control options today. Will repeat DCCV after she has loaded on amiodarone.  Continue amiodarone 200 mg BID for a total of 2 weeks, then decrease to once daily. Continue Eliquis 5 mg BID, patient denies any recent missed doses.   2. Secondary Hypercoagulable State (ICD10:  D68.69) The patient is at significant risk for stroke/thromboembolism based upon her CHA2DS2-VASc Score of 3.  Continue Apixaban (Eliquis).   3. ESRD HD on M/W/F  4. Valvular heart disease Moderate-severe AR Order cardiac MRI with contrast  to evaluate  5. Ascending aortic aneurysm  47 mm Cardiac MRI as above.   Follow up in the AF clinic post DCCV.    [facility] [address] [place], [place] [address] [phone] 07/11/2021 10:16 AM

## 2021-07-11 NOTE — Addendum Note (Signed)
Encounter addended by: Juluis Mire, RN on: 07/11/2021 4:48 PM  Actions taken: Visit diagnoses modified, Order list changed, Diagnosis association updated

## 2021-07-24 ENCOUNTER — Encounter (HOSPITAL_COMMUNITY): Payer: Self-pay | Admitting: Cardiovascular Disease

## 2021-07-28 ENCOUNTER — Other Ambulatory Visit (HOSPITAL_COMMUNITY): Payer: Self-pay

## 2021-07-28 MED ORDER — APIXABAN 5 MG PO TABS: 5.0000 mg | ORAL_TABLET | Freq: Two times a day (BID) | ORAL | 0 refills | Status: DC

## 2021-08-01 ENCOUNTER — Ambulatory Visit (HOSPITAL_COMMUNITY)
Admission: RE | Admit: 2021-08-01 | Discharge: 2021-08-01 | Disposition: A | Discharge status: Home / Self Care | Payer: Medicare Other | Source: Ambulatory Visit | Attending: Cardiovascular Disease | Admitting: Cardiovascular Disease

## 2021-08-01 ENCOUNTER — Ambulatory Visit (HOSPITAL_BASED_OUTPATIENT_CLINIC_OR_DEPARTMENT_OTHER): Payer: Medicare Other | Admitting: Certified Registered Nurse Anesthetist

## 2021-08-01 ENCOUNTER — Other Ambulatory Visit (HOSPITAL_COMMUNITY): Payer: Medicare Other | Admitting: Physician Assistant

## 2021-08-01 ENCOUNTER — Ambulatory Visit (HOSPITAL_COMMUNITY): Payer: Medicare Other | Admitting: Certified Registered Nurse Anesthetist

## 2021-08-01 ENCOUNTER — Other Ambulatory Visit: Payer: Self-pay

## 2021-08-01 ENCOUNTER — Encounter (HOSPITAL_COMMUNITY)
Admission: RE | Disposition: A | Discharge status: Home / Self Care | Payer: Self-pay | Source: Ambulatory Visit | Attending: Cardiovascular Disease

## 2021-08-01 DIAGNOSIS — I2510 Atherosclerotic heart disease of native coronary artery without angina pectoris: Secondary | ICD-10-CM | POA: Insufficient documentation

## 2021-08-01 DIAGNOSIS — I4819 Other persistent atrial fibrillation: Secondary | ICD-10-CM | POA: Insufficient documentation

## 2021-08-01 DIAGNOSIS — N186 End stage renal disease: Secondary | ICD-10-CM | POA: Diagnosis not present

## 2021-08-01 DIAGNOSIS — I4891 Unspecified atrial fibrillation: Secondary | ICD-10-CM

## 2021-08-01 DIAGNOSIS — Z87891 Personal history of nicotine dependence: Secondary | ICD-10-CM | POA: Diagnosis not present

## 2021-08-01 DIAGNOSIS — Z79899 Other long term (current) drug therapy: Secondary | ICD-10-CM | POA: Diagnosis not present

## 2021-08-01 DIAGNOSIS — I7121 Aneurysm of the ascending aorta, without rupture: Secondary | ICD-10-CM | POA: Insufficient documentation

## 2021-08-01 DIAGNOSIS — Z992 Dependence on renal dialysis: Secondary | ICD-10-CM

## 2021-08-01 HISTORY — PX: CARDIOVERSION: SHX1299

## 2021-08-01 LAB — POCT I-STAT, CHEM 8
BUN: 35 mg/dL — ABNORMAL HIGH (ref 8–23)
Calcium, Ion: 1.15 mmol/L (ref 1.15–1.40)
Chloride: 94 mmol/L — ABNORMAL LOW (ref 98–111)
Creatinine, Ser: 7.5 mg/dL — ABNORMAL HIGH (ref 0.44–1.00)
Glucose, Bld: 94 mg/dL (ref 70–99)
HCT: 36 % (ref 36.0–46.0)
Hemoglobin: 12.2 g/dL (ref 12.0–15.0)
Potassium: 3.8 mmol/L (ref 3.5–5.1)
Sodium: 137 mmol/L (ref 135–145)
TCO2: 35 mmol/L — ABNORMAL HIGH (ref 22–32)

## 2021-08-01 SURGERY — CARDIOVERSION
Anesthesia: General

## 2021-08-01 MED ORDER — LIDOCAINE 2% (20 MG/ML) 5 ML SYRINGE
INTRAMUSCULAR | Status: DC | PRN
  Administered 2021-08-01: 80 mg via INTRAVENOUS

## 2021-08-01 MED ORDER — PROPOFOL 10 MG/ML IV BOLUS
INTRAVENOUS | Status: DC | PRN
  Administered 2021-08-01: 80 mg via INTRAVENOUS

## 2021-08-01 MED ORDER — SODIUM CHLORIDE 0.9 % IV SOLN: INTRAVENOUS | Status: DC

## 2021-08-01 NOTE — Op Note (Signed)
Procedure: Electrical Cardioversion ?Indications:  Atrial Fibrillation ? ?Procedure Details: ? ?Consent: Risks of procedure as well as the alternatives and risks of each were explained to the (patient/caregiver).  Consent for procedure obtained. ? ?Time Out: Verified patient identification, verified procedure, site/side was marked, verified correct patient position, special equipment/implants available, medications/allergies/relevent history reviewed, required imaging and test results available.  Performed ? ?Patient placed on cardiac monitor, pulse oximetry, supplemental oxygen as necessary.  ?Sedation given:  propofol 80 mg IV ?Pacer pads placed anterior and posterior chest. ? ?Cardioverted 1 time(s).  ?Cardioversion with synchronized biphasic 150J shock. ? ?Evaluation: ?Findings: Post procedure EKG shows: NSR ?Complications: None ?Patient did tolerate procedure well. ? ?Time Spent Directly with the Patient: ? ?30 minutes  ? ?Gloria Gallagher ?08/01/2021, 10:27 AM ? ? ? ? ?

## 2021-08-01 NOTE — Transfer of Care (Signed)
Immediate Anesthesia Transfer of Care Note ? ?Patient: Gloria Gallagher ? ?Procedure(s) Performed: CARDIOVERSION ? ?Patient Location: Endoscopy Unit ? ?Anesthesia Type:General ? ?Level of Consciousness: awake and drowsy ? ?Airway & Oxygen Therapy: Patient Spontanous Breathing ? ?Post-op Assessment: Report given to RN and Post -op Vital signs reviewed and stable ? ?Post vital signs: Reviewed and stable ? ?Last Vitals:  ?Vitals Value Taken Time  ?BP    ?Temp    ?Pulse    ?Resp    ?SpO2    ? ? ?Last Pain:  ?Vitals:  ? 08/01/21 1001  ?TempSrc: Temporal  ?PainSc: 0-No pain  ?   ? ?  ? ?Complications: No notable events documented. ?

## 2021-08-01 NOTE — Discharge Instructions (Signed)

## 2021-08-01 NOTE — Interval H&P Note (Signed)
History and Physical Interval Note: ? ?08/01/2021 ?9:29 AM ? ?Gloria Gallagher  has presented today for surgery, with the diagnosis of AFIB.  The various methods of treatment have been discussed with the patient and family. After consideration of risks, benefits and other options for treatment, the patient has consented to  Procedure(s): ?CARDIOVERSION (N/A) as a surgical intervention.  The patient's history has been reviewed, patient examined, no change in status, stable for surgery.  I have reviewed the patient's chart and labs.  Questions were answered to the patient's satisfaction.   ? ? ?[name] ? ? ?

## 2021-08-01 NOTE — Anesthesia Postprocedure Evaluation (Signed)
Anesthesia Post Note ? ?Patient: Gloria Gallagher ? ?Procedure(s) Performed: CARDIOVERSION ? ?  ? ?Patient location during evaluation: PACU ?Anesthesia Type: General ?Level of consciousness: awake and alert ?Pain management: pain level controlled ?Vital Signs Assessment: post-procedure vital signs reviewed and stable ?Respiratory status: spontaneous breathing, nonlabored ventilation and respiratory function stable ?Cardiovascular status: blood pressure returned to baseline and stable ?Postop Assessment: no apparent nausea or vomiting ?Anesthetic complications: no ? ? ?No notable events documented. ? ?Last Vitals:  ?Vitals:  ? 08/01/21 1040 08/01/21 1050  ?BP: (!) 147/66 (!) 142/56  ?Pulse: 66 62  ?Resp: (!) 23 (!) 21  ?Temp:    ?SpO2: 96% 97%  ?  ?Last Pain:  ?Vitals:  ? 08/01/21 1050  ?TempSrc:   ?PainSc: 0-No pain  ? ? ?  ?  ?  ?  ?  ?  ? ?Lynda Rainwater ? ? ? ? ?

## 2021-08-01 NOTE — Anesthesia Preprocedure Evaluation (Signed)
Anesthesia Evaluation  ?Patient identified by MRN, date of birth, ID band ?Patient awake ? ? ? ?Reviewed: ?Allergy & Precautions, NPO status , Patient's Chart, lab work & pertinent test results ? ?Airway ?Mallampati: II ? ?TM Distance: >3 FB ?Neck ROM: Full ? ? ? Dental ? ?(+) Poor Dentition, Loose, Missing, Dental Advisory Given,  ?  ?Pulmonary ?former smoker,  ?  ?breath sounds clear to auscultation ? ? ? ? ? ? Cardiovascular ?+ CAD  ? ?Rhythm:Irregular Rate:Normal ? ? ?  ?Neuro/Psych ?negative neurological ROS ? negative psych ROS  ? GI/Hepatic ?negative GI ROS, Neg liver ROS,   ?Endo/Other  ?negative endocrine ROS ? Renal/GU ?ESRF and DialysisRenal disease  ? ?  ?Musculoskeletal ? ?(+) Arthritis , Osteoarthritis,   ? Abdominal ?  ?Peds ? Hematology ?negative hematology ROS ?(+)   ?Anesthesia Other Findings ? ? Reproductive/Obstetrics ? ?  ? ? ? ? ? ? ? ? ? ? ? ? ? ?  ?  ? ? ? ? ? ? ? ? ?Anesthesia Physical ? ?Anesthesia Plan ? ?ASA: 3 ? ?Anesthesia Plan: General  ? ?Post-op Pain Management:   ? ?Induction: Intravenous ? ?PONV Risk Score and Plan: 3 and Treatment may vary due to age or medical condition ? ?Airway Management Planned: Mask ? ?Additional Equipment: None ? ?Intra-op Plan:  ? ?Post-operative Plan:  ? ?Informed Consent: I have reviewed the patients History and Physical, chart, labs and discussed the procedure including the risks, benefits and alternatives for the proposed anesthesia with the patient or authorized representative who has indicated his/her understanding and acceptance.  ? ? ? ? ? ?Plan Discussed with: CRNA ? ?Anesthesia Plan Comments:   ? ? ? ? ? ? ?Anesthesia Quick Evaluation ? ?

## 2021-08-02 ENCOUNTER — Encounter (HOSPITAL_COMMUNITY): Payer: Self-pay | Admitting: Cardiovascular Disease

## 2021-08-08 ENCOUNTER — Other Ambulatory Visit: Payer: Self-pay

## 2021-08-08 ENCOUNTER — Ambulatory Visit (HOSPITAL_COMMUNITY)
Admission: RE | Admit: 2021-08-08 | Discharge: 2021-08-08 | Disposition: A | Discharge status: Home / Self Care | Payer: Medicare Other | Source: Ambulatory Visit | Attending: Physician Assistant | Admitting: Physician Assistant

## 2021-08-08 ENCOUNTER — Encounter (HOSPITAL_COMMUNITY): Payer: Self-pay | Admitting: Physician Assistant

## 2021-08-08 VITALS — BP 136/74 | HR 67 | Ht 63.0 in | Wt 169.6 lb

## 2021-08-08 DIAGNOSIS — D6869 Other thrombophilia: Secondary | ICD-10-CM

## 2021-08-08 DIAGNOSIS — Z7901 Long term (current) use of anticoagulants: Secondary | ICD-10-CM | POA: Diagnosis not present

## 2021-08-08 DIAGNOSIS — I4819 Other persistent atrial fibrillation: Secondary | ICD-10-CM | POA: Insufficient documentation

## 2021-08-08 DIAGNOSIS — I7121 Aneurysm of the ascending aorta, without rupture: Secondary | ICD-10-CM | POA: Diagnosis not present

## 2021-08-08 DIAGNOSIS — N186 End stage renal disease: Secondary | ICD-10-CM | POA: Insufficient documentation

## 2021-08-08 DIAGNOSIS — Z79899 Other long term (current) drug therapy: Secondary | ICD-10-CM | POA: Insufficient documentation

## 2021-08-08 DIAGNOSIS — I519 Heart disease, unspecified: Secondary | ICD-10-CM | POA: Diagnosis not present

## 2021-08-08 DIAGNOSIS — Z992 Dependence on renal dialysis: Secondary | ICD-10-CM | POA: Insufficient documentation

## 2021-08-08 NOTE — Progress Notes (Signed)
? ? ?Primary Care Physician: Default, Provider, MD ?Primary Cardiologist: Dr Acie Fredrickson ?Primary Electrophysiologist: none ?Referring Physician: [facility] ? ? ?Gloria Gallagher is a 71 y.o. female with a history of ESRD, aortic atherosclerosis, carotid artery disease, and atrial fibrillation who presents for follow up in the [facility].  The patient was initially diagnosed with atrial fibrillation after presenting to urgent care for a wishy-washy sound in her right ear.  A new heart murmur was noted with concern for referred murmur and carotid region with possible bruit.  She had an EKG done which did show A. fib without history of same and was advised to go to the ED for further evaluation. CTA neck unremarkable for dissection. She was diagnosed with acute otitis media and was started on doxycycline. She was unaware of her arrhythmia. Patient was started on Eliquis for a CHADS2VASC score of 3. In hindsight, patient has noted increased exercise intolerance for the previous 3 months.  ? ?Patient is s/p DCCV on 05/16/21 with early return of afib. She was started on amiodarone on 06/28/21. Patient reported that she felt a little improved since starting amiodarone but still had dyspnea with exertion. Her echo showed EF 55-60%, mod-severe AR, ascending aortic aneurysm 47 mm.  ? ?On follow up today, patient is s/p repeat DCCV on 08/01/21. She does report that she feels better in SR. She can walk farther before becoming SOB. She is tolerating the medication without difficulty. She has a cardiac MRI scheduled for 4/11.  ? ?Today, she denies symptoms of palpitations, chest pain, shortness of breath, orthopnea, PND, lower extremity edema, dizziness, presyncope, syncope, snoring, daytime somnolence, bleeding, or neurologic sequela. The patient is tolerating medications without difficulties and is otherwise without complaint today.  ? ? ?Atrial Fibrillation Risk Factors: ? ?she does not have symptoms  or diagnosis of sleep apnea. ?she does not have a history of rheumatic fever. ?she does not have a history of alcohol use. ?The patient does have a history of early familial atrial fibrillation or other arrhythmias. Two sisters have afib. ? ?she has a BMI of Body mass index is 30.04 kg/m?Marland KitchenMarland Kitchen ?Filed Weights  ? 08/08/21 1008  ?Weight: 76.9 kg  ? ? ? ? ?No family history on file. ? ? ?Atrial Fibrillation Management history: ? ?Previous antiarrhythmic drugs: amiodarone  ?Previous cardioversions: 05/16/21, 08/01/21 ?Previous ablations: none ?CHADS2VASC score: 3 ?Anticoagulation history: Eliquis ? ? ?Past Medical History:  ?Diagnosis Date  ? Aortic atherosclerosis ([place])   ? Arthritis   ? Atrial fibrillation ([place])   ? CAD (coronary artery disease)   ? Dialysis patient [facility])   ? ESRD (end stage renal disease) ([place])   ? Gout   ? Heart murmur   ? Renal disorder   ? ?Past Surgical History:  ?Procedure Laterality Date  ? CARDIOVERSION N/A 05/16/2021  ? Procedure: CARDIOVERSION;  Surgeon: Thayer Headings, MD;  Location: [facility];  Service: Cardiovascular;  Laterality: N/A;  ? CARDIOVERSION N/A 08/01/2021  ? Procedure: CARDIOVERSION;  Surgeon: Sanda Klein, MD;  Location: [facility];  Service: Cardiovascular;  Laterality: N/A;  ? ? ?Current Outpatient Medications  ?Medication Sig Dispense Refill  ? amiodarone (PACERONE) 200 MG tablet Take 1 tablet (200 mg total) by mouth 2 (two) times daily. Take 200 mg twice a a day for 2 weeks then decrease to 200 mg daily 103 tablet 1  ? apixaban (ELIQUIS) 5 MG TABS tablet Take 1 tablet (5 mg total) by mouth 2 (two) times  daily. 28 tablet 0  ? ferric citrate (AURYXIA) 1 GM 210 MG(Fe) tablet Take 630 mg by mouth 3 (three) times daily with meals.    ? multivitamin (RENA-VIT) TABS tablet Take 1 tablet by mouth daily.    ? ?No current facility-administered medications for this encounter.  ? ? ?Allergies  ?Allergen Reactions  ? Penicillin G Hives  ? ? ?Social History  ? ?Socioeconomic History  ?  Marital status: Single  ?  Spouse name: Not on file  ? Number of children: Not on file  ? Years of education: Not on file  ? Highest education level: Not on file  ?Occupational History  ? Not on file  ?Tobacco Use  ? Smoking status: Former  ?  Types: Cigarettes  ?  Passive exposure: Never  ? Smokeless tobacco: Never  ? Tobacco comments:  ?  Former smoker 04/25/2021  ?Vaping Use  ? Vaping Use: Never used  ?Substance and Sexual Activity  ? Alcohol use: Yes  ?  Alcohol/week: 1.0 standard drink  ?  Types: 1 Cans of beer per week  ?  Comment: 1 beer every other week 06/20/21  ? Drug use: Never  ? Sexual activity: Not Currently  ?Other Topics Concern  ? Not on file  ?Social History Narrative  ? Not on file  ? ?Social Determinants of Health  ? ?Financial Resource Strain: Not on file  ?Food Insecurity: Not on file  ?Transportation Needs: Not on file  ?Physical Activity: Not on file  ?Stress: Not on file  ?Social Connections: Not on file  ?Intimate Partner Violence: Not on file  ? ? ? ?ROS- All systems are reviewed and negative except as per the HPI above. ? ?Physical Exam: ?Vitals:  ? 08/08/21 1008  ?BP: 136/74  ?Pulse: 67  ?Weight: 76.9 kg  ?Height: 5\' 3"  (1.6 m)  ? ? ?GEN- The patient is a well appearing female, alert and oriented x 3 today.   ?HEENT-head normocephalic, atraumatic, sclera clear, conjunctiva pink, hearing intact, trachea midline. ?Lungs- Clear to ausculation bilaterally, normal work of breathing ?Heart- Regular rate and rhythm, no rubs or gallops, 3/6 systolic murmur, 2/6 diastolic murmur   ?GI- soft, NT, ND, + BS ?Extremities- no clubbing, cyanosis, or edema ?MS- no significant deformity or atrophy ?Skin- no rash or lesion ?Psych- euthymic mood, full affect ?Neuro- strength and sensation are intact ? ? ?Wt Readings from Last 3 Encounters:  ?08/08/21 76.9 kg  ?07/11/21 74.8 kg  ?06/28/21 74.1 kg  ? ? ?EKG today demonstrates  ?SR, 1st degree AV block ?Vent. rate 67 BPM ?PR interval 262 ms ?QRS duration 106  ms ?QT/QTcB 452/477 ms ? ?Echo 07/04/21 ? 1. Left ventricular ejection fraction, by estimation, is 55 to 60%. The  ?left ventricle has normal function. The left ventricle has no regional  ?wall motion abnormalities. The left ventricular internal cavity size was  ?mildly dilated. There is moderate left ventricular hypertrophy. Left ventricular diastolic parameters are indeterminate.  ? 2. Right ventricular systolic function is mildly reduced. The right  ?ventricular size is normal. There is normal pulmonary artery systolic  ?pressure. The estimated right ventricular systolic pressure is 81.4 mmHg.  ? 3. Left atrial size was severely dilated.  ? 4. Right atrial size was moderately dilated.  ? 5. The mitral valve is normal in structure. Mild mitral valve  ?regurgitation.  ? 6. The aortic valve is bicuspid. Aortic valve regurgitation is moderate  ?to severe. Aortic valve sclerosis is present, with no evidence  of aortic  ?valve stenosis.  ? 7. The inferior vena cava is dilated in size with >50% respiratory  ?variability, suggesting right atrial pressure of 8 mmHg.  ? 8. Aneurysm of the ascending aorta, measuring 47 mm.  ? ?Conclusion(s)/Recommendation(s): Ascending aortic aneurysm and moderate to severe AI. Consider cardiac MRI/MRA aorta to evaluate both aortic aneurysm and quantify AI severity.  ? ? ?Epic records are reviewed at length today ? ?CHA2DS2-VASc Score = 3  ?The patient's score is based upon: ?CHF History: 0 ?HTN History: 0 ?Diabetes History: 0 ?Stroke History: 0 ?Vascular Disease History: 1 (aortic atherosclerosis, carotid disease) ?Age Score: 1 ?Gender Score: 1 ?    ? ? ?ASSESSMENT AND PLAN: ?1. Persistent Atrial Fibrillation (ICD10:  I48.19) ?The patient's CHA2DS2-VASc score is 3, indicating a 3.2% annual risk of stroke.   ?S/p DCCV on 08/01/21 ?Patient appears to be maintaining SR.  ?Continue amiodarone 200 mg daily ?Continue Eliquis 5 mg BID ? ?2. Secondary Hypercoagulable State (ICD10:  D68.69) ?The  patient is at significant risk for stroke/thromboembolism based upon her CHA2DS2-VASc Score of 3.  Continue Apixaban (Eliquis).  ? ?3. ESRD ?HD on M/W/F ? ?4. Valvular heart disease ?Moderate-severe AR ?Ca

## 2021-08-21 ENCOUNTER — Other Ambulatory Visit (HOSPITAL_COMMUNITY): Payer: Self-pay | Admitting: Physician Assistant

## 2021-08-21 ENCOUNTER — Telehealth (HOSPITAL_COMMUNITY): Payer: Self-pay | Admitting: Emergency Medicine

## 2021-08-21 DIAGNOSIS — I77810 Thoracic aortic ectasia: Secondary | ICD-10-CM

## 2021-08-21 DIAGNOSIS — I351 Nonrheumatic aortic (valve) insufficiency: Secondary | ICD-10-CM

## 2021-08-21 NOTE — Telephone Encounter (Signed)
Reaching out to patient to offer assistance regarding upcoming cardiac imaging study; pt verbalizes understanding of appt date/time, parking situation and where to check in, and verified current allergies; name and call back number provided for further questions should they arise ?Marchia Bond RN Navigator Cardiac Imaging ?[facility] ?[phone] office ?[phone] cell ? ? ?Denies iv issues other than L arm restriction due to HD fistual site, ?Denies claustro ?Denies metal implants ?Arrival 1130 ?

## 2021-08-22 ENCOUNTER — Ambulatory Visit (HOSPITAL_COMMUNITY)
Admission: RE | Admit: 2021-08-22 | Discharge: 2021-08-22 | Disposition: A | Discharge status: Home / Self Care | Payer: Medicare Other | Source: Ambulatory Visit | Attending: Physician Assistant | Admitting: Physician Assistant

## 2021-08-22 DIAGNOSIS — I351 Nonrheumatic aortic (valve) insufficiency: Secondary | ICD-10-CM | POA: Insufficient documentation

## 2021-08-22 DIAGNOSIS — I719 Aortic aneurysm of unspecified site, without rupture: Secondary | ICD-10-CM | POA: Insufficient documentation

## 2021-08-22 DIAGNOSIS — I77810 Thoracic aortic ectasia: Secondary | ICD-10-CM | POA: Diagnosis present

## 2021-08-22 IMAGING — MR MR MRA CHEST W/ OR W/O CM
16 series · 40 of 40 positions shown · IV contrast (Contrast agent)
Comparison: None.

CLINICAL DATA: 70-year-old female with a history of aortic aneurysm

EXAM:
MRA CHEST WITH OR WITHOUT CONTRAST
TECHNIQUE: Angiographic images of the chest were obtained using MRA technique
without and with intravenous contrast.
CONTRAST:  8mL GADAVIST GADOBUTROL 1 MMOL/ML IV SOLN

[Series 38: t2_trufi_tra_p2_bh · axial · 8.0mm · 0.62mm/px · z∈[-140,+50]mm · 2 of 20 slices shown]
[im 1/20]
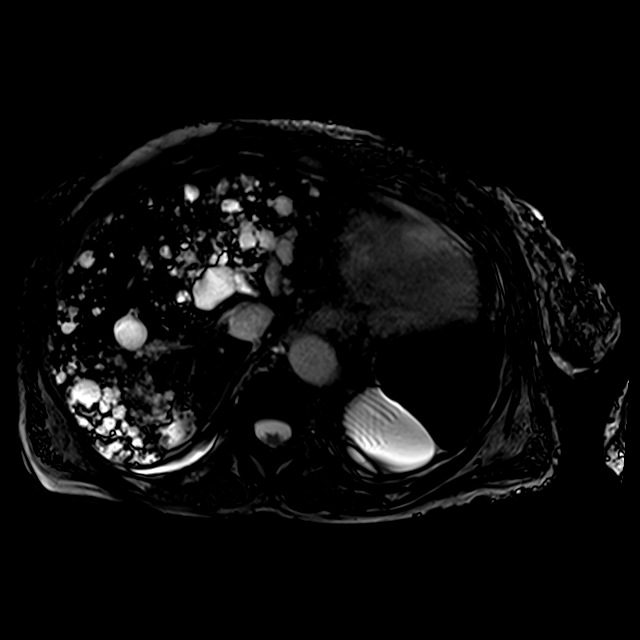
[im 20/20]
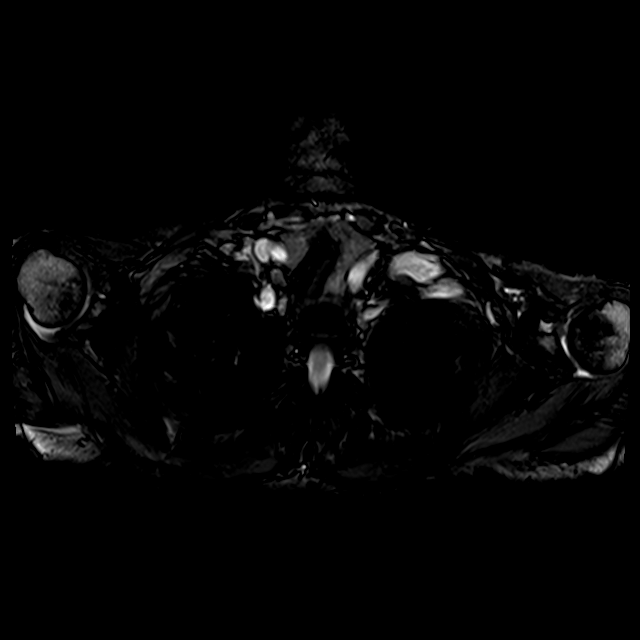

[Series 39: axial_db_haste_loc · axial · 7.0mm · 1.56mm/px · z∈[-136,+47]mm · 2 of 22 slices shown]
[im 1/22]
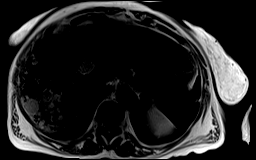
[im 22/22]
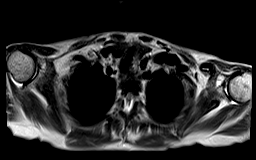

[Series 40: (person_name)_(person_name)_(person_name) · sagittal · 8.0mm · 1.79mm/px · 2 of 25 slices shown (1 of 7)]
[im 1/25]
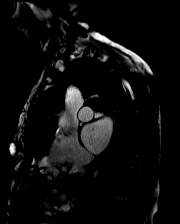
[im 25/25]
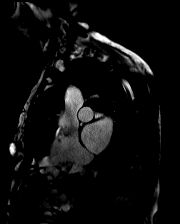

[Series 40: (person_name)_(person_name)_(person_name) · sagittal · 8.0mm · 1.79mm/px · 1 of 25 slices shown (2 of 7)]
[im 1/25]
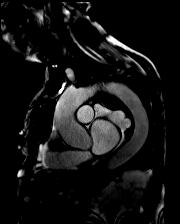

[Series 40: (person_name)_(person_name)_(person_name) · sagittal · 8.0mm · 1.79mm/px · 1 of 25 slices shown (3 of 7)]
[im 1/25]
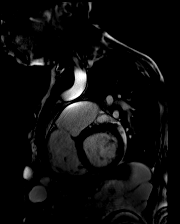

[Series 40: (person_name)_(person_name)_(person_name) · sagittal · 8.0mm · 1.79mm/px · 1 of 25 slices shown (4 of 7)]
[im 1/25]
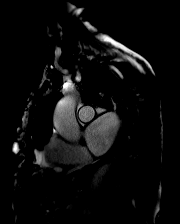

[Series 40: (person_name)_(person_name)_(person_name) · sagittal · 8.0mm · 1.79mm/px · 1 of 25 slices shown (5 of 7)]
[im 1/25]
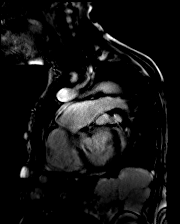

[Series 40: (person_name)_(person_name)_(person_name) · sagittal · 8.0mm · 1.79mm/px · 1 of 25 slices shown (6 of 7)]
[im 1/25]
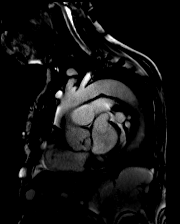

[Series 40: (person_name)_(person_name)_(person_name) · sagittal · 8.0mm · 1.79mm/px · 1 of 25 slices shown (7 of 7)]
[im 1/25]
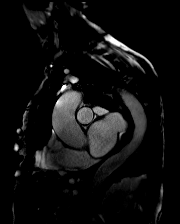

[Series 41: T1 dynamic · axial · non-contrast · 3.3mm · 1.18mm/px · z∈[-175,+86]mm · 4 of 80 slices shown]
[im 1/80]
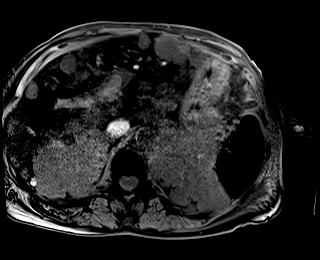
[im 27/80]
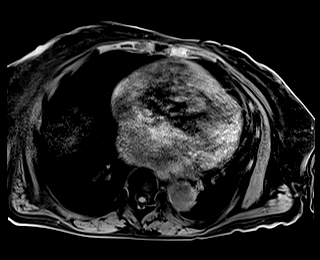
[im 53/80]
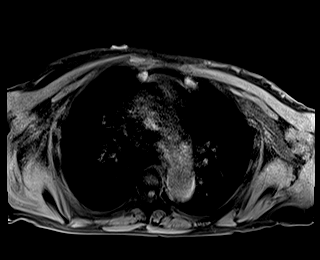
[im 80/80]
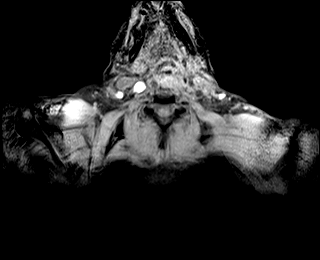

[Series 42: angio_fl3d_sag_pre · sagittal · 1.1mm · 1.17mm/px · 4 of 96 slices shown]
[im 1/96]
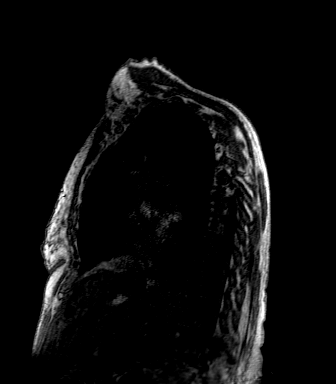
[im 32/96]
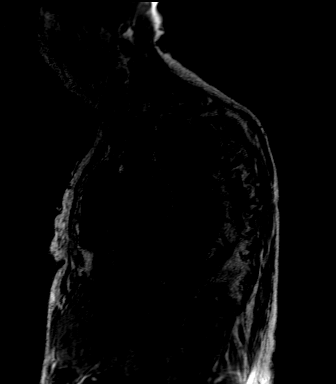
[im 64/96]
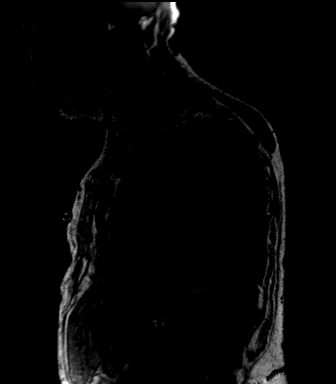
[im 96/96]
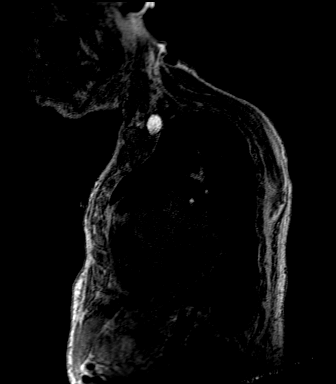

[Series 44: candy cane ce-arterial · sagittal · arterial · 1.1mm · 1.17mm/px · 4 of 96 slices shown]
[im 1/96]
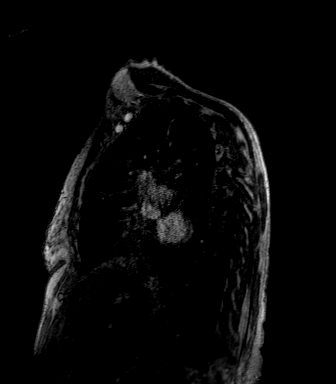
[im 32/96]
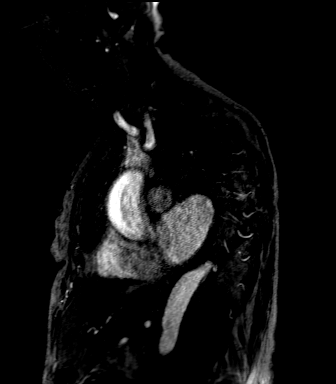
[im 64/96]
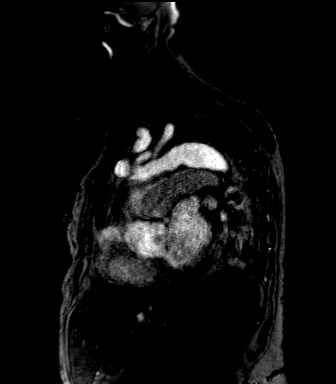
[im 96/96]
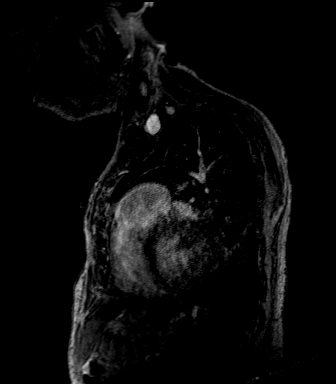

[Series 45: candy cane ce-arterial_sub · sagittal · 1.1mm · 1.17mm/px · 4 of 96 slices shown]
[im 1/96]
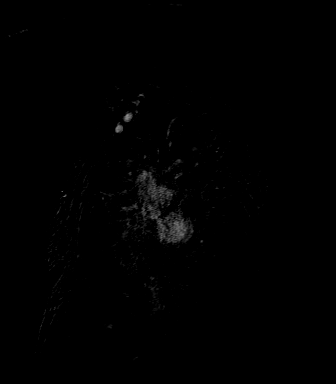
[im 32/96]
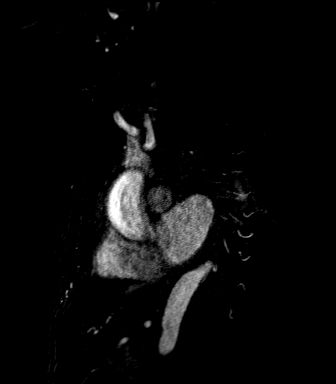
[im 64/96]
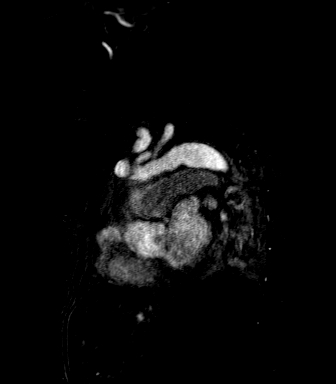
[im 96/96]
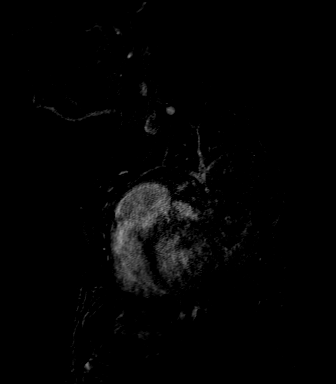

[Series 47: candy cane ce-venous · sagittal · portal-venous · 1.1mm · 1.17mm/px · 4 of 96 slices shown]
[im 1/96]
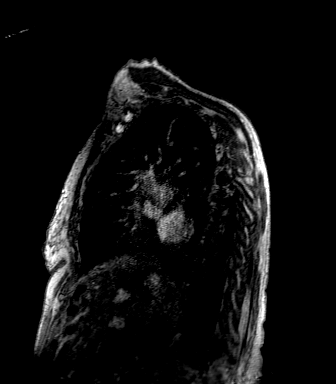
[im 32/96]
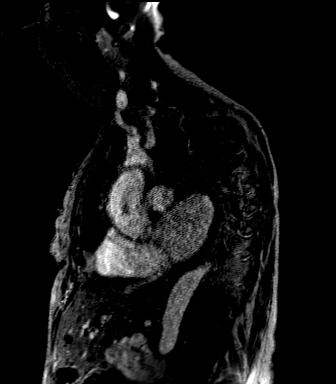
[im 64/96]
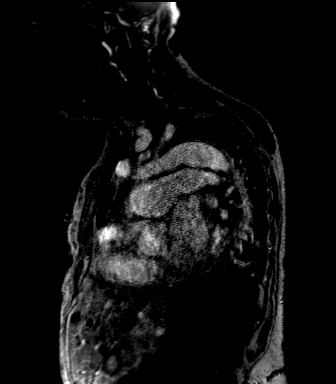
[im 96/96]
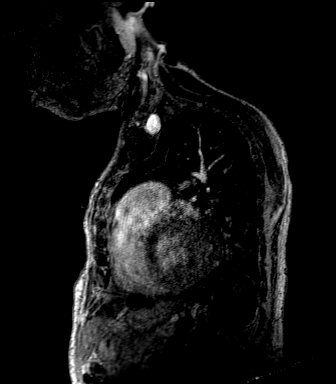

[Series 48: candy cane ce-venous_sub · sagittal · 1.1mm · 1.17mm/px · 4 of 96 slices shown]
[im 1/96]
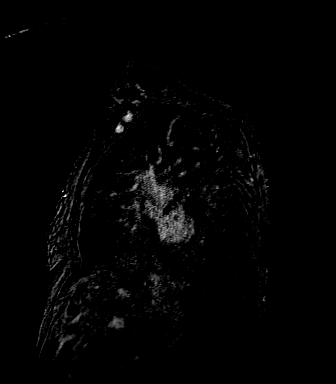
[im 32/96]
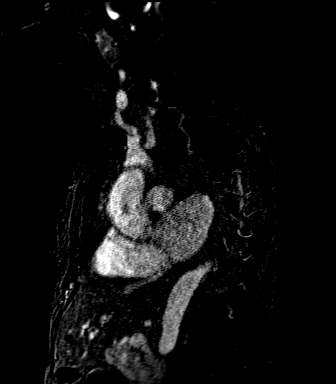
[im 64/96]
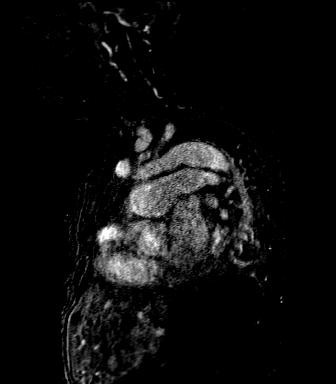
[im 96/96]
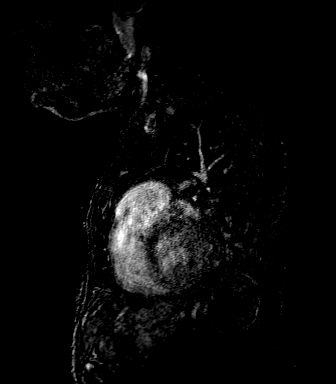

[Series 50: T1 dynamic post-contrast · axial · 3.3mm · 1.18mm/px · z∈[-175,+86]mm · 4 of 80 slices shown]
[im 1/80]
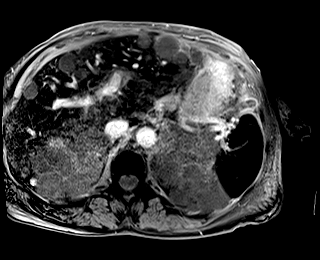
[im 27/80]
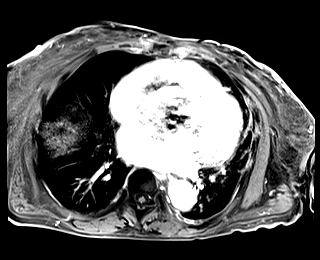
[im 53/80]
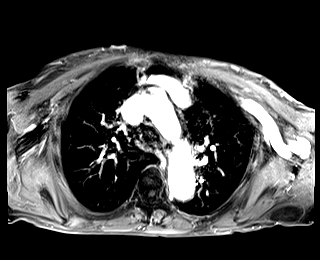
[im 80/80]
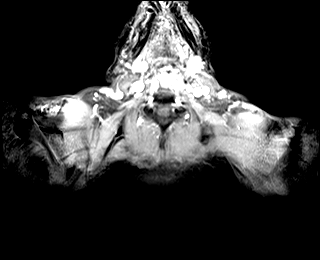

[40 of 40 positions shown; findings below may reference images not displayed]

FINDINGS: VASCULAR

Aorta: Estimated diameter of the annulus on the postcontrast
parasagittal images is 4.1 cm.

Estimating the diameter at the sinotubular junction is difficult
given the orientation on the given sequences. Estimated 36 mm
GALVAO junction.

Greatest estimated diameter of the ascending aorta is 41 mm.

Common origin of the innominate artery an the left common carotid
artery. Flow signal of the branch arteries is maintained.

Heart: Cardiomegaly.  No pericardial fluid.

Pulmonary Arteries: Main pulmonary artery measures 5.5 cm. No
comparison

Other: Multiple body wall collateral veins. Subclavian veins both
appear to maintain normal flow signal. Questionable narrowing of the
left brachiocephalic vein as it joins the right. No comparison
available.

NON-VASCULAR

Lungs/pleura:

Unremarkable signal of the lungs.

Nodule of the right lower lobe at the periphery measures 9 mm with
no comparison.

No pleural effusion

Mediastinum:

No adenopathy.

Musculoskeletal: Unremarkable signal of the vertebral bodies. 4.5 cm
lipoma of the left posterior chest wall, posterior to the shoulder
girdle musculature.

Upper abdomen:

Innumerable cysts within the kidneys, and within the liver
parenchyma.

Rounded lesion without T2 fluid signal within the dome of the liver
measures 6.7 cm x 4.2 Cm, with internal enhancement. There is also
evidence of extension beyond the superior margin of the liver, which
would not be expected for a benign lesion such as hemangioma.
Adjacent smaller lesion with T1 intense characteristics also
enhances. Neither are completely characterized.
IMPRESSION: **An incidental finding of potential clinical significance has been
found. Suspicious enhancing lesion at the superior right liver,
cm incompletely characterized, with apparent exophytic extension.**
further evaluation with contrast-enhanced abdominal/pelvic CT is
recommended. Smaller adjacent enhancing lesion may be characterized
at that time

These above results will be called to the ordering clinician or
representative by the Radiologist Assistant, and communication
documented in the PACS or [REDACTED].

Greatest estimated diameter of the ascending aorta 4.1 cm. Recommend
annual imaging followup by CTA or MRA. This recommendation follows
[BK] ACCF/AHA/AATS/ACR/ASA/SCA/GALVAO/GALVAO/GALVAO/GALVAO Guidelines for the
Diagnosis and Management of Patients with Thoracic Aortic Disease.
Circulation. [BK]; 121: [id number]. Aortic aneurysm NOS ([BK]-[BK])

Diameter of the main pulmonary artery estimated 5.5 cm, suggesting
pulmonary artery hypertension.

Cardiomegaly

Likely stenosis of the left brachiocephalic vein, with associated
body wall venous collaterals

Changes polycystic kidney disease, with associated cystic changes of
the liver parenchyma.

## 2021-08-22 IMAGING — MR MR CARD MORPHOLOGY WO/W CM
45 of 48 series · 45 of 48 positions shown · IV contrast (Contrast agent)
Comparison: none

EXAM:
CARDIAC MRI
TECHNIQUE: The patient was scanned on a 1.5 Tesla Siemens magnet. A dedicated
cardiac coil was used. Functional imaging was done using [employer]
sequences. [DATE], and 4 chamber views were done to assess for RWMA's.
Modified SEKAVCNIK rule using a short axis stack was used to
calculate an ejection fraction on a dedicated work station using
Circle software. The patient received 8 cc of Gadavist. After 10
minutes inversion recovery sequences were used to assess for
infiltration and scar tissue.

CONTRAST:  Gadavist

[Series 4: t2_haste_db_tra_bh · axial · 8.0mm · 1.48mm/px · 1 of 16 slices shown]
[im 1/16]
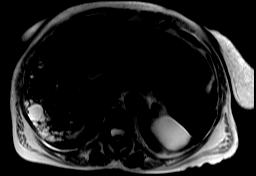

[Series 9: bSSFP · sagittal · 8.0mm · 1.61mm/px · 1 of 25 slices shown (1 of 19)]
[im 1/25]
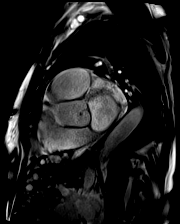

[Series 10: bSSFP · sagittal · 8.0mm · 1.61mm/px · 1 of 25 slices shown (2 of 19)]
[im 1/25]
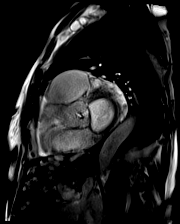

[Series 11: bSSFP · sagittal · 8.0mm · 1.61mm/px · 1 of 25 slices shown (3 of 19)]
[im 1/25]
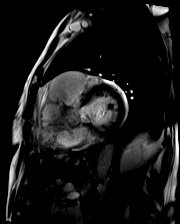

[Series 12: bSSFP · sagittal · 8.0mm · 1.61mm/px · 1 of 25 slices shown (4 of 19)]
[im 1/25]
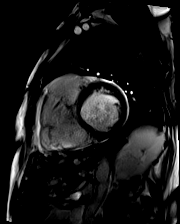

[Series 13: bSSFP · sagittal · 8.0mm · 1.61mm/px · 1 of 25 slices shown (5 of 19)]
[im 1/25]
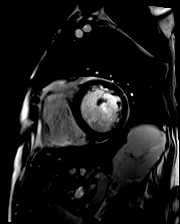

[Series 14: bSSFP · sagittal · 8.0mm · 1.61mm/px · 1 of 25 slices shown (6 of 19)]
[im 1/25]
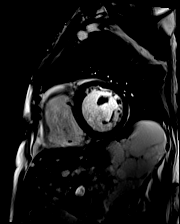

[Series 15: bSSFP · sagittal · 8.0mm · 1.61mm/px · 1 of 25 slices shown (7 of 19)]
[im 1/25]
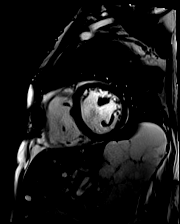

[Series 16: bSSFP · sagittal · 8.0mm · 1.61mm/px · 1 of 25 slices shown (8 of 19)]
[im 1/25]
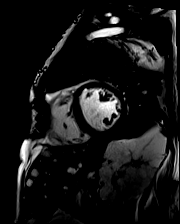

[Series 17: bSSFP · sagittal · 8.0mm · 1.61mm/px · 1 of 25 slices shown (9 of 19)]
[im 1/25]
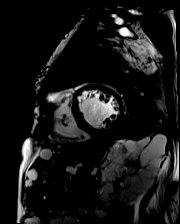

[Series 18: bSSFP · sagittal · 8.0mm · 1.61mm/px · 1 of 25 slices shown (10 of 19)]
[im 1/25]
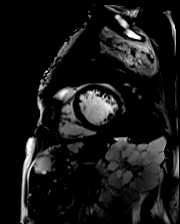

[Series 19: bSSFP · sagittal · 8.0mm · 1.61mm/px · 1 of 25 slices shown (11 of 19)]
[im 1/25]
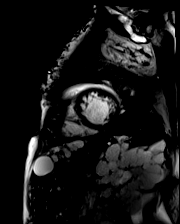

[Series 20: bSSFP · sagittal · 8.0mm · 1.61mm/px · 1 of 25 slices shown (12 of 19)]
[im 1/25]
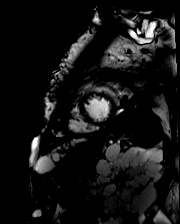

[Series 21: bSSFP · sagittal · 8.0mm · 1.61mm/px · 1 of 25 slices shown (13 of 19)]
[im 1/25]
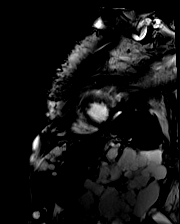

[Series 22: bSSFP · sagittal · 8.0mm · 1.61mm/px · 1 of 25 slices shown (14 of 19)]
[im 1/25]
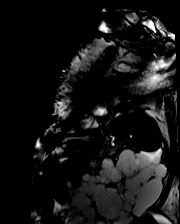

[Series 23: bSSFP · sagittal · 8.0mm · 1.61mm/px · 1 of 25 slices shown (15 of 19)]
[im 1/25]
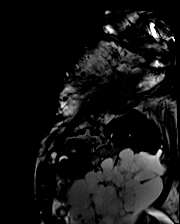

[Series 24: bSSFP · sagittal · 8.0mm · 1.61mm/px · 1 of 25 slices shown (16 of 19)]
[im 1/25]
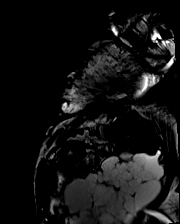

[Series 25: bSSFP · axial · 6.0mm · 1.41mm/px · 1 of 25 slices shown (17 of 19)]
[im 1/25]
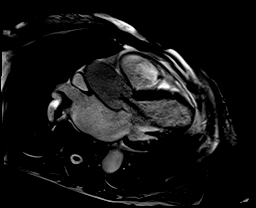

[Series 26: bSSFP · coronal · 6.0mm · 1.41mm/px · 1 of 25 slices shown (18 of 19)]
[im 1/25]
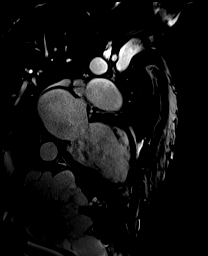

[Series 28: (id)_long_t1 · sagittal · 8.0mm · 1.56mm/px · 1 of 24 slices shown]
[im 1/24]
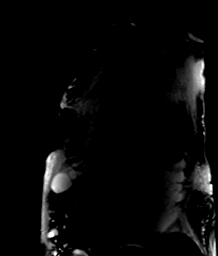

[Series 29: (id)_long_t1_moco · sagittal · 8.0mm · 1.56mm/px · 1 of 24 slices shown]
[im 1/24]
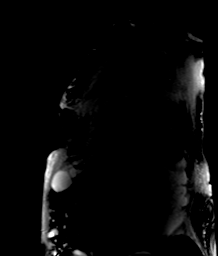

[Series 32: (id)_trufi · sagittal · 8.0mm · 2.08mm/px · 1 of 9 slices shown]
[im 1/9]
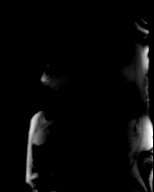

[Series 33: (id)_trufi_moco · sagittal · 8.0mm · 2.08mm/px · 1 of 9 slices shown]
[im 1/9]
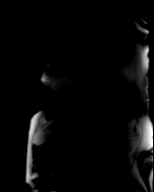

[Series 36: bSSFP · axial · 6.0mm · 1.41mm/px · 1 of 25 slices shown (19 of 19)]
[im 1/25]
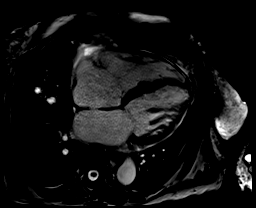

[Series 37: cine_trufi_short axis_cs_2_shot · sagittal · 8.0mm · 1.48mm/px · 1 of 25 slices shown (1 of 15)]
[im 1/25]
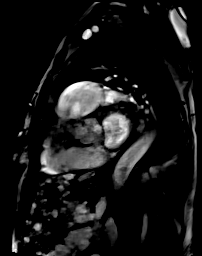

[Series 37: cine_trufi_short axis_cs_2_shot · sagittal · 8.0mm · 1.48mm/px · 1 of 25 slices shown (2 of 15)]
[im 1/25]
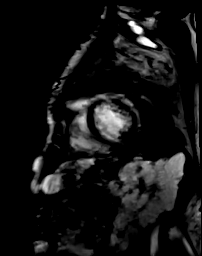

[Series 37: cine_trufi_short axis_cs_2_shot · sagittal · 8.0mm · 1.48mm/px · 1 of 25 slices shown (3 of 15)]
[im 1/25]
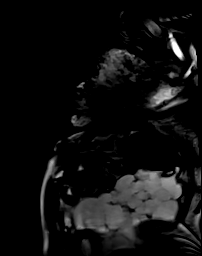

[Series 37: cine_trufi_short axis_cs_2_shot · sagittal · 8.0mm · 1.48mm/px · 1 of 25 slices shown (4 of 15)]
[im 1/25]
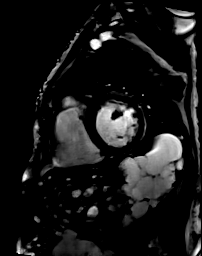

[Series 37: cine_trufi_short axis_cs_2_shot · sagittal · 8.0mm · 1.48mm/px · 1 of 25 slices shown (5 of 15)]
[im 1/25]
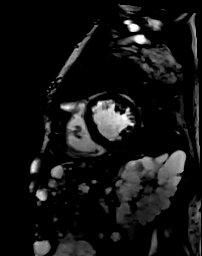

[Series 37: cine_trufi_short axis_cs_2_shot · sagittal · 8.0mm · 1.48mm/px · 1 of 25 slices shown (6 of 15)]
[im 1/25]
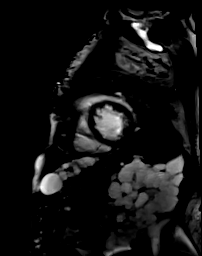

[Series 37: cine_trufi_short axis_cs_2_shot · sagittal · 8.0mm · 1.48mm/px · 1 of 25 slices shown (7 of 15)]
[im 1/25]
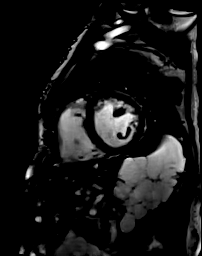

[Series 37: cine_trufi_short axis_cs_2_shot · sagittal · 8.0mm · 1.48mm/px · 1 of 25 slices shown (8 of 15)]
[im 1/25]
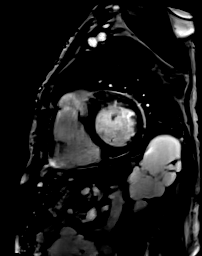

[Series 37: cine_trufi_short axis_cs_2_shot · sagittal · 8.0mm · 1.48mm/px · 1 of 25 slices shown (9 of 15)]
[im 1/25]
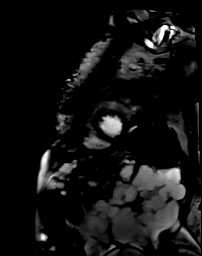

[Series 37: cine_trufi_short axis_cs_2_shot · sagittal · 8.0mm · 1.48mm/px · 1 of 25 slices shown (10 of 15)]
[im 1/25]
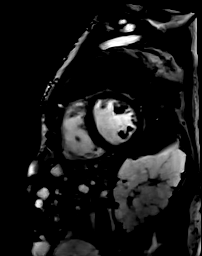

[Series 37: cine_trufi_short axis_cs_2_shot · sagittal · 8.0mm · 1.48mm/px · 1 of 25 slices shown (11 of 15)]
[im 1/25]
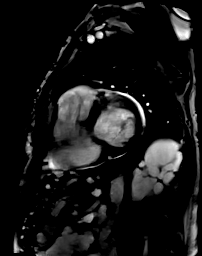

[Series 37: cine_trufi_short axis_cs_2_shot · sagittal · 8.0mm · 1.48mm/px · 1 of 25 slices shown (12 of 15)]
[im 1/25]
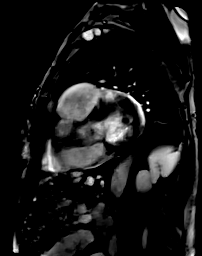

[Series 37: cine_trufi_short axis_cs_2_shot · sagittal · 8.0mm · 1.48mm/px · 1 of 25 slices shown (13 of 15)]
[im 1/25]
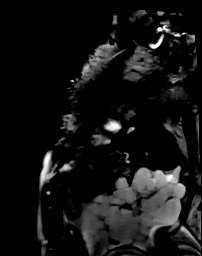

[Series 37: cine_trufi_short axis_cs_2_shot · sagittal · 8.0mm · 1.48mm/px · 1 of 25 slices shown (14 of 15)]
[im 1/25]
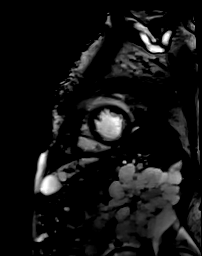

[Series 37: cine_trufi_short axis_cs_2_shot · sagittal · 8.0mm · 1.48mm/px · 1 of 25 slices shown (15 of 15)]
[im 1/25]
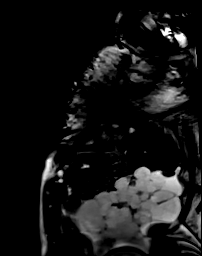

[Series 38: t2_trufi_tra_p2_bh · axial · 8.0mm · 0.62mm/px · 1 of 20 slices shown]
[im 1/20]
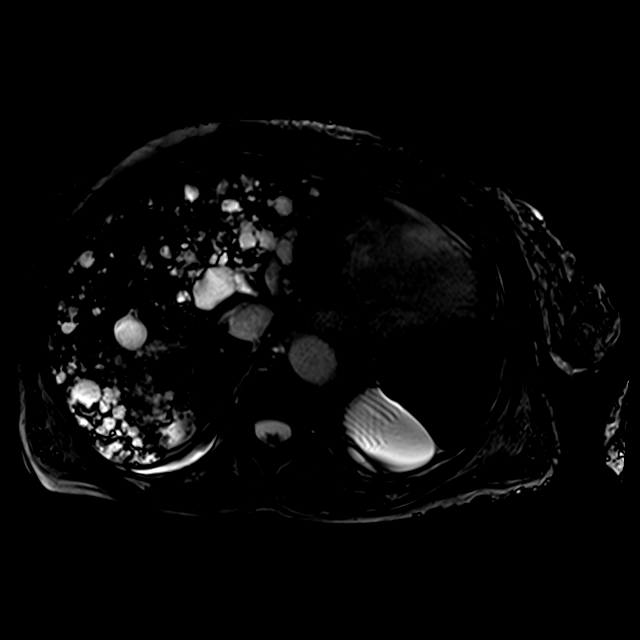

[Series 39: axial_db_haste_loc · axial · 7.0mm · 1.56mm/px · 1 of 22 slices shown]
[im 1/22]
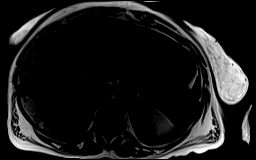

[Series 40: (person_name)_(person_name)_(person_name) · sagittal · 8.0mm · 1.79mm/px · 1 of 25 slices shown (1 of 4)]
[im 1/25]
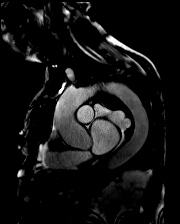

[Series 40: (person_name)_(person_name)_(person_name) · sagittal · 8.0mm · 1.79mm/px · 1 of 25 slices shown (2 of 4)]
[im 1/25]
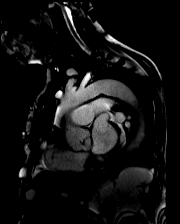

[Series 40: (person_name)_(person_name)_(person_name) · sagittal · 8.0mm · 1.79mm/px · 1 of 25 slices shown (3 of 4)]
[im 1/25]
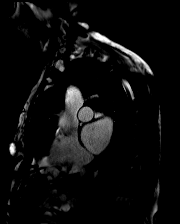

[Series 40: (person_name)_(person_name)_(person_name) · sagittal · 8.0mm · 1.79mm/px · 1 of 25 slices shown (4 of 4)]
[im 1/25]
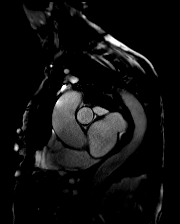

[45 of 48 positions shown; findings below may reference images not displayed]

FINDINGS: Moderate bi atrial enlargement No PFO/ASD. No SEKAVCNIK thrombus. Mild MV
thickening with mild appearing MR. AV is tri leaflet with
significant aortic root enlargement and central aortic insufficiency
There is severe main PA dilatation measuring 5.4 cm There is
moderate appearing PR and mild TR Trivial pericardial effusion

There is mild LVE with normal systolic function LVEF is 64% (EDV 275
cc EDV 98 cc SV 176 cc) There is no delayed hyper-enhancement on
inversion recovery sequences

Normal RV size and function RVEF 56% (EDV 255 ESV 113 SV 142 cc)

Phase Flow Analysis

Total forward SV 153 cc

Total SV by LVEF 176 cc

Regurgitant Volume 23 cc

Regurgitant Fraction 13%

Vena Contracta Width 25% of LVOT diameter

Aortic Sinus Right: 3.6 cm Non: 3.8 cm Left 3.7 cm

STJ: Effaced 3.8 cm

Ascending Thoracic aorta 4.6 cm

Aortic Arch 3.3 cm

Descending Thoracic aorta: 2.8 cm
IMPRESSION: 1 Tri- leaflet AV with dilated sinus and root leading to central AR.
By quantification and vena contracta only mild to moderate AR RF/RV
may be spurious due
To concomitant mitral regurgitation

2. Severely fusiform dilatation of the ascending thoracic aorta
cm

3.  Severe aneurysm dilatation of the main pulmonary artery 5.4 cm

4.  Mild LVE with LVEF 64%

5.  Normal RV size and function RVEF 56%

6.  Moderate bi atrial enlargement

7.  No delayed hyper enhancement on gadolinium images

8.  Mild appearing MR

9.  Moderate PR

10.  Mild TR

Concern that patient may have connective tissue disease given severe
aneurysmal dilatation of the ascending thoracic aorta and main
pulmonary artery Suggest

Possible genetic testing for Loeys-Dietz syndrome or other

SEKAVCNIK

## 2021-08-22 MED ORDER — GADOBUTROL 1 MMOL/ML IV SOLN
8.0000 mL | Freq: Once | INTRAVENOUS | Status: AC | PRN
  Administered 2021-08-22: 8 mL via INTRAVENOUS

## 2021-08-30 ENCOUNTER — Other Ambulatory Visit (HOSPITAL_COMMUNITY): Payer: Self-pay | Admitting: *Deleted

## 2021-08-30 ENCOUNTER — Encounter: Payer: Self-pay | Admitting: Gastroenterology

## 2021-08-30 ENCOUNTER — Telehealth: Payer: Self-pay | Admitting: Cardiovascular Disease

## 2021-08-30 MED ORDER — APIXABAN 5 MG PO TABS: 5.0000 mg | ORAL_TABLET | Freq: Two times a day (BID) | ORAL | 3 refills | Status: DC

## 2021-08-30 NOTE — Telephone Encounter (Signed)
Pt c/o medication issue: ? ?1. Name of Medication:  ?amiodarone (PACERONE) 200 MG tablet ? ?2. How are you currently taking this medication (dosage and times per day)?  ? ?3. Are you having a reaction (difficulty breathing--STAT)?  ? ?4. What is your medication issue?  ? ?Patient would like to know if she needs to continue taking Amiodarone. ? ?

## 2021-08-30 NOTE — Telephone Encounter (Signed)
Pt called tp report that she is almost out of Amiodarone and asking if she has to stay on it.... she has h/o persistent AFIB but says she does not feel like she is in Afib anymore according to her... she also says she has only been taking the Amiodarone 200 mg once a day since 06/2021... I will forward to [facility] PA for his review and recommendations.  ?

## 2021-08-31 MED ORDER — AMIODARONE HCL 200 MG PO TABS: 200.0000 mg | ORAL_TABLET | Freq: Every day | ORAL | 2 refills | Status: DC

## 2021-08-31 NOTE — Telephone Encounter (Signed)
Rx sent for amiodarone 200mg  once a day ?

## 2021-09-12 ENCOUNTER — Other Ambulatory Visit: Payer: Self-pay

## 2021-09-12 DIAGNOSIS — I998 Other disorder of circulatory system: Secondary | ICD-10-CM

## 2021-09-13 NOTE — Progress Notes (Signed)
[facility] [place] ? ?ASSESSMENT / PLAN: ?[name] is a 71 y.o. female with atherosclerosis of native arteries of bilateral lower extremities causing atypical symptoms. ? ?Recommend the following which can slow the progression of atherosclerosis and reduce the risk of major adverse cardiac / limb events:  ?Complete cessation from all tobacco products. ?Blood glucose control with goal A1c < 7%. ?Blood pressure control with goal blood pressure < 140/90 mmHg. ?Lipid reduction therapy with goal LDL-C <100 mg/dL (<70 if symptomatic from PAD).  ?Aspirin 81mg  PO QD.  ?Atorvastatin 40-80mg  PO QD (or other "high intensity" statin therapy). ? ?Patient describes neuropathic type discomfort to me today in the clinic.  She does have worrisome ABI.  I will plan to see her in short and we will follow-up in several months.  I counseled her and limb threatening symptoms, and she knows to return to care sooner should these develop.  I encouraged her to discuss gabapentin with her primary care physician. ? ?CHIEF COMPLAINT: Paresthesias in right foot ? ?HISTORY OF PRESENT ILLNESS: ?Gloria Gallagher is a 71 y.o. female referred to clinic for evaluation of possible lower extremity arterial disease.  The patient describes paresthesias and electric discomfort in her right foot.  She does not report pain, per se.  She just describes an odd sensation which is bothersome to her.  She does not report symptoms classic of intermittent claudication.  She can walk without pain in her calves.  She has no ischemic ulcers about her foot ? ?Past Medical History:  ?Diagnosis Date  ? Aortic atherosclerosis ([place])   ? Arthritis   ? Atrial fibrillation ([place])   ? CAD (coronary artery disease)   ? Dialysis patient [facility])   ? ESRD (end stage renal disease) ([place])   ? Gout   ? Heart murmur   ? Renal disorder   ? ? ?Past Surgical History:  ?Procedure Laterality Date  ? CARDIOVERSION N/A 05/16/2021  ? Procedure: CARDIOVERSION;   Surgeon: Thayer Headings, MD;  Location: [place];  Service: Cardiovascular;  Laterality: N/A;  ? CARDIOVERSION N/A 08/01/2021  ? Procedure: CARDIOVERSION;  Surgeon: Sanda Klein, MD;  [facility];  Service: Cardiovascular;  Laterality: N/A;  ? ? ?History reviewed. No pertinent family history. ? ?Social History  ? ?Socioeconomic History  ? Marital status: Single  ?  Spouse name: Not on file  ? Number of children: Not on file  ? Years of education: Not on file  ? Highest education level: Not on file  ?Occupational History  ? Not on file  ?Tobacco Use  ? Smoking status: Former  ?  Types: Cigarettes  ?  Passive exposure: Never  ? Smokeless tobacco: Never  ? Tobacco comments:  ?  Former smoker 04/25/2021  ?Vaping Use  ? Vaping Use: Never used  ?Substance and Sexual Activity  ? Alcohol use: Yes  ?  Alcohol/week: 1.0 standard drink  ?  Types: 1 Cans of beer per week  ?  Comment: 1 beer every other week 06/20/21  ? Drug use: Never  ? Sexual activity: Not Currently  ?Other Topics Concern  ? Not on file  ?Social History Narrative  ? Not on file  ? ?Social Determinants of Health  ? ?Financial Resource Strain: Not on file  ?Food Insecurity: Not on file  ?Transportation Needs: Not on file  ?Physical Activity: Not on file  ?Stress: Not on file  ?Social Connections: Not on file  ?Intimate Partner Violence: Not on  file  ? ? ?Allergies  ?Allergen Reactions  ? Penicillin G Hives  ? ? ?Current Outpatient Medications  ?Medication Sig Dispense Refill  ? amiodarone (PACERONE) 200 MG tablet Take 1 tablet (200 mg total) by mouth daily. 90 tablet 2  ? apixaban (ELIQUIS) 5 MG TABS tablet Take 1 tablet (5 mg total) by mouth 2 (two) times daily. 60 tablet 3  ? ferric citrate (AURYXIA) 1 GM 210 MG(Fe) tablet Take 630 mg by mouth 3 (three) times daily with meals.    ? multivitamin (RENA-VIT) TABS tablet Take 1 tablet by mouth daily.    ? ?No current facility-administered medications for this visit.  ? ? ?PHYSICAL EXAM ?Vitals:   ? 09/14/21 1544  ?BP: (!) 153/65  ?Pulse: 69  ?Resp: 14  ?Temp: (!) 97.3 ?F (36.3 ?C)  ?TempSrc: Temporal  ?Weight: 161 lb (73 kg)  ?Height: 5\' 5"  (1.651 m)  ? ? ?Constitutional: Chronically ill-appearing woman in no acute distress ?Cardiac: Regular rate and rhythm.  ?Respiratory:  unlabored. ?Abdominal:  soft, non-tender, non-distended.  ?Peripheral vascular: No palpable pedal pulses.  No ulcers about the heel, foot, toes, interdigital spaces. ? ?PERTINENT LABORATORY AND RADIOLOGIC DATA ? ?Most recent CBC ? ?  Latest Ref Rng & Units 08/01/2021  ? 10:10 AM 05/16/2021  ?  9:36 AM 04/18/2021  ?  1:08 PM  ?CBC  ?WBC 4.0 - 10.5 K/uL  4.2   5.2    ?Hemoglobin 12.0 - 15.0 g/dL 12.2   11.9   11.9    ?Hematocrit 36.0 - 46.0 % 36.0   37.4   38.2    ?Platelets 150 - 400 K/uL  157   210    ?  ? ?Most recent CMP ? ?  Latest Ref Rng & Units 08/01/2021  ? 10:10 AM 06/20/2021  ? 11:14 AM 05/16/2021  ?  9:36 AM  ?CMP  ?Glucose 70 - 99 mg/dL 94   100   95    ?BUN 8 - 23 mg/dL 35   56   70    ?Creatinine 0.44 - 1.00 mg/dL 7.50   8.66   10.59    ?Sodium 135 - 145 mmol/L 137   138   134    ?Potassium 3.5 - 5.1 mmol/L 3.8   4.2   4.0    ?Chloride 98 - 111 mmol/L 94   95   91    ?CO2 22 - 32 mmol/L  30   24    ?Calcium 8.9 - 10.3 mg/dL  10.2   9.8    ?Total Protein 6.5 - 8.1 g/dL  6.3     ?Total Bilirubin 0.3 - 1.2 mg/dL  0.6     ?Alkaline Phos 38 - 126 U/L  111     ?AST 15 - 41 U/L  19     ?ALT 0 - 44 U/L  15     ? ? ?+-------+-----------+-----------+------------+------------+  ?ABI/TBIToday's ABIToday's TBIPrevious ABIPrevious TBI  ?+-------+-----------+-----------+------------+------------+  ?Right  0.43       0                                    ?+-------+-----------+-----------+------------+------------+  ?Left   0.50       0.12                                 ?+-------+-----------+-----------+------------+------------+  ? ?  Gloria Gallagher. Stanford Breed, MD ?[facility] [place] ?Office Phone Number: [phone] ?09/14/2021 4:04 PM ? ?Total time spent on preparing this encounter including chart review, data review, collecting history, examining the patient, coordinating care for this new patient, 60 minutes. ? ?Portions of this report may have been transcribed using voice recognition software.  Every effort has been made to ensure accuracy; however, inadvertent computerized transcription errors may still be present. ? ? ? ?

## 2021-09-14 ENCOUNTER — Ambulatory Visit (INDEPENDENT_AMBULATORY_CARE_PROVIDER_SITE_OTHER): Payer: Medicare Other | Admitting: Vascular Surgery

## 2021-09-14 ENCOUNTER — Encounter: Payer: Self-pay | Admitting: Vascular Surgery

## 2021-09-14 ENCOUNTER — Ambulatory Visit (HOSPITAL_COMMUNITY)
Admission: RE | Admit: 2021-09-14 | Discharge: 2021-09-14 | Disposition: A | Discharge status: Home / Self Care | Payer: Medicare Other | Source: Ambulatory Visit | Attending: Vascular Surgery | Admitting: Vascular Surgery

## 2021-09-14 VITALS — BP 153/65 | HR 69 | Temp 97.3°F | Resp 14 | Ht 65.0 in | Wt 161.0 lb

## 2021-09-14 DIAGNOSIS — I998 Other disorder of circulatory system: Secondary | ICD-10-CM | POA: Diagnosis present

## 2021-09-14 DIAGNOSIS — I739 Peripheral vascular disease, unspecified: Secondary | ICD-10-CM | POA: Diagnosis not present

## 2021-09-19 ENCOUNTER — Other Ambulatory Visit: Payer: Self-pay | Admitting: *Deleted

## 2021-09-19 ENCOUNTER — Encounter: Payer: Self-pay | Admitting: Gastroenterology

## 2021-09-19 ENCOUNTER — Other Ambulatory Visit: Payer: Medicare Other

## 2021-09-19 ENCOUNTER — Ambulatory Visit (INDEPENDENT_AMBULATORY_CARE_PROVIDER_SITE_OTHER): Payer: Medicare Other | Admitting: Gastroenterology

## 2021-09-19 VITALS — BP 140/70 | HR 66 | Ht 65.0 in | Wt 162.0 lb

## 2021-09-19 DIAGNOSIS — Z1212 Encounter for screening for malignant neoplasm of rectum: Secondary | ICD-10-CM

## 2021-09-19 DIAGNOSIS — K769 Liver disease, unspecified: Secondary | ICD-10-CM | POA: Diagnosis not present

## 2021-09-19 DIAGNOSIS — Z1211 Encounter for screening for malignant neoplasm of colon: Secondary | ICD-10-CM | POA: Diagnosis not present

## 2021-09-19 DIAGNOSIS — I739 Peripheral vascular disease, unspecified: Secondary | ICD-10-CM

## 2021-09-19 NOTE — Progress Notes (Deleted)
Liv ?

## 2021-09-19 NOTE — Progress Notes (Signed)
? ? ? ?09/19/2021 ?KODEE DRURY ?[id number] ?Apr 28, 1951 ? ? ?HISTORY OF PRESENT ILLNESS: This is a 71 year old female who is new to our office.  She has been referred here by Cipriano Mile, NP, for evaluation of a liver lesion.  She had an MR angio of the chest that showed a suspicious enhancing lesion at the superior right liver, 6.7 cm incompletely characterized, with apparent exophytic extension.  Radiology recommended a CT scan of the abdomen and pelvis with contrast to evaluate this further.  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  Patient denies any GI complaints.  She was not sure why she was being seen here.  Says that her stools are always dark due to one of her medications or one her binders or something that she takes for her dialysis.  She is a longstanding dialysis patient, over 10 years.  Her hemoglobin was normal at 11.9 g when it was last checked a few months ago.  MCV is normal.  She takes Eliquis for history of atrial fibrillation.  She has never had colonoscopy or any type of colon cancer screening in the past.  No family history of colon cancer. ? ?LFTs are normal. ? ? ?Past Medical History:  ?Diagnosis Date  ? Aortic atherosclerosis ([place])   ? Arthritis   ? Atrial fibrillation ([place])   ? CAD (coronary artery disease)   ? Dialysis patient [facility])   ? ESRD (end stage renal disease) ([place])   ? Gout   ? Heart murmur   ? Hyperparathyroidism ([place])   ? Renal disorder   ? Seasonal allergies   ? ?Past Surgical History:  ?Procedure Laterality Date  ? CARDIOVERSION N/A 05/16/2021  ? Procedure: CARDIOVERSION;  Surgeon: Thayer Headings, MD;  Location: [place];  Service: Cardiovascular;  Laterality: N/A;  ? CARDIOVERSION N/A 08/01/2021  ? Procedure: CARDIOVERSION;  Surgeon: Sanda Klein, MD;  [facility];  Service: Cardiovascular;  Laterality: N/A;  ? ? reports that she has quit smoking. Her smoking use included cigarettes. She has never been exposed to tobacco smoke. She  has never used smokeless tobacco. She reports current alcohol use of about 1.0 standard drink per week. She reports that she does not use drugs. ?family history includes Diabetes in her sister. ?Allergies  ?Allergen Reactions  ? Penicillin G Hives  ? ? ?  ?Outpatient Encounter Medications as of 09/19/2021  ?Medication Sig  ? amiodarone (PACERONE) 200 MG tablet Take 1 tablet (200 mg total) by mouth daily.  ? apixaban (ELIQUIS) 5 MG TABS tablet Take 1 tablet (5 mg total) by mouth 2 (two) times daily.  ? multivitamin (RENA-VIT) TABS tablet Take 1 tablet by mouth daily.  ? [DISCONTINUED] ferric citrate (AURYXIA) 1 GM 210 MG(Fe) tablet Take 630 mg by mouth 3 (three) times daily with meals.  ? ?No facility-administered encounter medications on file as of 09/19/2021.  ? ? ? ?REVIEW OF SYSTEMS  : All other systems reviewed and negative except where noted in the History of Present Illness. ? ? ?PHYSICAL EXAM: ?BP 140/70   Pulse 66   Ht 5\' 5"  (1.651 m)   Wt 162 lb (73.5 kg)   BMI 26.96 kg/m?  ?General: Well developed female in no acute distress ?Head: Normocephalic and atraumatic ?Eyes:  Sclerae anicteric, conjunctiva pink. ?Ears: Normal auditory acuity ?Lungs: Clear throughout to auscultation; no W/R/R. ?Heart: Regular rate and rhythm; no M/R/G. ?Abdomen: Soft, non-distended.  BS present.  Non-tender. ?Musculoskeletal: Symmetrical with no gross deformities  ?  Skin: No lesions on visible extremities ?Extremities: No edema  ?Neurological: Alert oriented x 4, grossly non-focal ?Psychological:  Alert and cooperative. Normal mood and affect ? ?ASSESSMENT AND PLAN: ?*Liver lesion:  Suspicious enhancing lesion at the superior right liver, 6.7 cm incompletely characterized, with apparent exophytic extension.  This was seen on a MR angio of the chest.  Radiologist recommended a CT scan of the abdomen and pelvis with contrast to evaluate further.  Will order.  Will check an AFP as well.  LFTs are normal. ?*CRC screening:  Never had a  colonoscopy in the past.  No history of colon cancer screening.  Patient agreeable to Cologuard to start.  If positive and she requires a colonoscopy this will need to be done at [facility] hospital since she is on dialysis.  Is also on Eliquis. ?*ESRD on HD ?*History of atrial fibrillation on Eliquis ? ? ?CC:  Cipriano Mile, NP ? ?  ?

## 2021-09-19 NOTE — Patient Instructions (Signed)
If you are age 73 or older, your body mass index should be between 23-30. Your Body mass index is 26.96 kg/m?Marland Kitchen If this is out of the aforementioned range listed, please consider follow up with your Primary Care Provider. ? ?If you are age 102 or younger, your body mass index should be between 19-25. Your Body mass index is 26.96 kg/m?Marland Kitchen If this is out of the aformentioned range listed, please consider follow up with your Primary Care Provider.  ? ?________________________________________________________ ? ?The [facility] providers would like to encourage you to use [facility] to communicate with providers for non-urgent requests or questions.  Due to long hold times on the telephone, sending your provider a message by [facility] may be a faster and more efficient way to get a response.  Please allow 48 business hours for a response.  Please remember that this is for non-urgent requests.  ? ?Your provider has requested that you go to the basement level for lab work before leaving today. Press "B" on the elevator. The lab is located at the first door on the left as you exit the elevator. ? ?Your provider has ordered Cologuard testing as an option for colon cancer screening. This is performed by [employer] and may be out of network with your insurance. PRIOR to completing the test, it is YOUR responsibility to contact your insurance about covered benefits for this test. Your out of pocket expense could be anywhere from $0.00 to $649.00.  ? ?When you call to check coverage with your insurer, please provide the following information:  ? ?-The ONLY provider of Cologuard is [facility]  ?- CPT code for Cologuard is [id number].  ?-[employer] NPI # [id number]  ?-[employer] Tax ID # [id number]  ? ?We have already sent your demographic and insurance information to [employer] (phone number [phone]) and they should contact you within the next week regarding your test. If you  have not heard from them within the next week, please call our office at [phone]. ? ?Due to recent changes in healthcare laws, you may see the results of your imaging and laboratory studies on MyChart before your provider has had a chance to review them.  We understand that in some cases there may be results that are confusing or concerning to you. Not all laboratory results come back in the same time frame and the provider may be waiting for multiple results in order to interpret others.  Please give [place] 48 hours in order for your provider to thoroughly review all the results before contacting the office for clarification of your results.   ? ?You have been scheduled for a CT scan of the abdomen and pelvis at [facility], 1st floor Radiology. You are scheduled on 09-26-2021  at 8:30am. You should arrive 15 minutes prior to your appointment time for registration.  Please pick up 2 bottles of contrast from [place] at least 3 days prior to your scan. The solution may taste better if refrigerated, but do NOT add ice or any other liquid to this solution. Shake well before drinking.  ? ?Please follow the written instructions below on the day of your exam:  ? ?1) Do not eat anything after 4:30am (4 hours prior to your test)  ? ?2) Drink 1 bottle of contrast @ 6:30am (2 hours prior to your exam)  Remember to shake well before drinking and do NOT pour over ice. ?    Drink 1 bottle of  contrast @ 7:30am (1 hour prior to your exam)  ? ?You may take any medications as prescribed with a small amount of water, if necessary. If you take any of the following medications: METFORMIN, GLUCOPHAGE, GLUCOVANCE, AVANDAMET, RIOMET, FORTAMET, ACTOPLUS MET, JANUMET, [place] or METAGLIP, you MAY be asked to HOLD this medication 48 hours AFTER the exam.  ? ?The purpose of you drinking the oral contrast is to aid in the visualization of your intestinal tract. The contrast solution may cause some diarrhea. Depending on your  individual set of symptoms, you may also receive an intravenous injection of x-ray contrast/dye. Plan on being at [facility] for 45 minutes or longer, depending on the type of exam you are having performed.  ? ?If you have any questions regarding your exam or if you need to reschedule, you may call [facility] at [phone] between the hours of 8:00 am and 5:00 pm, Monday-Friday.  ? ? ?It was a pleasure to see you today! ? ?Thank you for trusting me with your gastrointestinal care!   ? ? ?

## 2021-09-19 NOTE — Progress Notes (Signed)
Agree with assessment and plan as outlined.  

## 2021-09-21 LAB — AFP TUMOR MARKER: AFP-Tumor Marker: 6.9 ng/mL — ABNORMAL HIGH

## 2021-09-26 ENCOUNTER — Telehealth: Payer: Self-pay | Admitting: Gastroenterology

## 2021-09-26 ENCOUNTER — Ambulatory Visit (HOSPITAL_COMMUNITY): Payer: Medicare Other

## 2021-09-26 NOTE — Telephone Encounter (Signed)
Pt returned call. I informed her that there is no alternative for the contrast for the CT. I informed her that this is not a bowel prep and it is used to enhance the images of the CT. Pt states that she is not sure if she will be able to drink it. She will call us back if she can't. Pt had no concerns at the end of the call. ?

## 2021-09-26 NOTE — Telephone Encounter (Signed)
We received a call from patient. Per patient, scheduled CT imaging this Thursday. Patient has contrast solution but is wanting to know if there is alternative to the solution like a pill or capsule. Please advise. ?

## 2021-09-26 NOTE — Telephone Encounter (Signed)
Lm on vm for patient to return call 

## 2021-09-26 NOTE — Telephone Encounter (Signed)
CT appt has been cancelled per pt request.  ?

## 2021-09-26 NOTE — Telephone Encounter (Signed)
Patient called requesting to cancel her CT appt said she can not tolerate the contrast. ?

## 2021-09-28 ENCOUNTER — Ambulatory Visit (HOSPITAL_COMMUNITY): Payer: Medicare Other

## 2021-09-28 NOTE — Telephone Encounter (Signed)
I spoke with the CT dept and they state that the pt should tell them when she gets to the appt she should tell them that she is not able to drink the contrast and they will proceed without oral contrast.  I have notified the pt and sent the order to be rescheduled.

## 2021-10-02 ENCOUNTER — Telehealth: Payer: Self-pay

## 2021-10-02 NOTE — Telephone Encounter (Signed)
-----   Message from April H Pait sent at 10/02/2021  3:32 PM EDT -----  ----- Message ----- From: Cathlean Cower Sent: 10/02/2021   2:38 PM EDT To: April H Pait  Patient declined to schedule until she speaks with md ----- Message ----- From: Sheldon Silvan, April H Sent: 09/28/2021  11:02 AM EDT To: Cathlean Cower   ----- Message ----- From: Timothy Lasso, RN Sent: 09/28/2021  10:57 AM EDT To: April H Pait, [place]os[place] Maser  Can you please reschedule this pt for CT scan.  She will not be drinking contrast. I just spoke with CT and they said to have the pt tell them she will not be drinking the contrast.

## 2021-10-02 NOTE — Telephone Encounter (Signed)
Gloria Gallagher the pt declines to schedule CT scan.  She prefers to see you in the office to discuss first.  She will keep office appt as planned.

## 2021-10-03 ENCOUNTER — Other Ambulatory Visit: Payer: Self-pay | Admitting: Student

## 2021-10-03 DIAGNOSIS — E2839 Other primary ovarian failure: Secondary | ICD-10-CM

## 2021-10-16 ENCOUNTER — Other Ambulatory Visit: Payer: Self-pay | Admitting: Student

## 2021-10-16 DIAGNOSIS — E2839 Other primary ovarian failure: Secondary | ICD-10-CM

## 2021-10-17 ENCOUNTER — Ambulatory Visit
Admission: RE | Admit: 2021-10-17 | Discharge: 2021-10-17 | Disposition: A | Discharge status: Home / Self Care | Payer: Medicare Other | Source: Ambulatory Visit | Attending: Student | Admitting: Student

## 2021-10-17 DIAGNOSIS — E2839 Other primary ovarian failure: Secondary | ICD-10-CM

## 2021-10-19 ENCOUNTER — Other Ambulatory Visit (HOSPITAL_COMMUNITY): Payer: Self-pay

## 2021-10-19 MED ORDER — APIXABAN 5 MG PO TABS: 5.0000 mg | ORAL_TABLET | Freq: Two times a day (BID) | ORAL | 3 refills | Status: DC

## 2021-10-19 MED ORDER — AMIODARONE HCL 200 MG PO TABS: 200.0000 mg | ORAL_TABLET | Freq: Every day | ORAL | 2 refills | Status: DC

## 2021-10-26 ENCOUNTER — Ambulatory Visit: Payer: Medicare Other | Admitting: Gastroenterology

## 2021-11-10 ENCOUNTER — Ambulatory Visit: Payer: Medicare Other | Admitting: Cardiovascular Disease

## 2021-11-15 ENCOUNTER — Encounter: Payer: Self-pay | Admitting: Cardiovascular Disease

## 2021-11-15 NOTE — Progress Notes (Unsigned)
Cardiology Office Note:    Date:  11/16/2021   ID:  [name], DOB 1950/07/22, MRN [id number]  PCP:  Cipriano Mile, NP   [facility] Providers Cardiologist:  Meli Faley  }    Referring MD: Cipriano Mile, NP   Chief Complaint  Patient presents with   Atrial Fibrillation         Feb. 15, 2023   Gloria Gallagher is a 71 y.o. female with a hx of ESRD, aortic sclerosis, carotid artery disease, atrial fib She is s/p  successful cardioversion  Doing well from a cardiac standpoint Feeling well after cardioversion  Some DOE climbing stairs  Has been on dialysis > 10 years .   ECG today shows she is back in afib   November 16, 2021 Rease is seen today for follow up of her atrial fib She had a successful cardioversion but went back into afib  We loaded her on amio  She has seen Lacie Draft ( [facility])      Past Medical History:  Diagnosis Date   Aortic atherosclerosis ([place])    Arthritis    Atrial fibrillation ([place])    CAD (coronary artery disease)    Dialysis patient ([place])    ESRD (end stage renal disease) ([place])    Gout    Heart murmur    Hyperparathyroidism ([place])    Renal disorder    Seasonal allergies     Past Surgical History:  Procedure Laterality Date   CARDIOVERSION N/A 05/16/2021   Procedure: CARDIOVERSION;  Surgeon: Tc Kapusta, Wonda Cheng, MD;  Location: [facility];  Service: Cardiovascular;  Laterality: N/A;   CARDIOVERSION N/A 08/01/2021   Procedure: CARDIOVERSION;  Surgeon: Sanda Klein, MD;  Location: [facility];  Service: Cardiovascular;  Laterality: N/A;    Current Medications: Current Meds  Medication Sig   amiodarone (PACERONE) 200 MG tablet Take 1 tablet (200 mg total) by mouth daily.   apixaban (ELIQUIS) 5 MG TABS tablet Take 1 tablet (5 mg total) by mouth 2 (two) times daily.   atorvastatin (LIPITOR) 20 MG tablet Take 20 mg by mouth at bedtime.   Doxercalciferol (HECTOROL IV) During dialysis, 3X week   multivitamin (RENA-VIT) TABS tablet  Take 1 tablet by mouth daily.     Allergies:   Penicillin g   Social History   Socioeconomic History   Marital status: Single    Spouse name: Not on file   Number of children: 1   Years of education: Not on file   Highest education level: Not on file  Occupational History   Occupation: retired  Tobacco Use   Smoking status: Former    Types: Cigarettes    Passive exposure: Never   Smokeless tobacco: Never   Tobacco comments:    Former smoker 04/25/2021  Vaping Use   Vaping Use: Never used  Substance and Sexual Activity   Alcohol use: Yes    Alcohol/week: 1.0 standard drink of alcohol    Types: 1 Cans of beer per week    Comment: 1 beer every other week 06/20/21   Drug use: Never   Sexual activity: Not Currently  Other Topics Concern   Not on file  Social History Narrative   Not on file   Social Determinants of Health   Financial Resource Strain: Not on file  Food Insecurity: Not on file  Transportation Needs: Not on file  Physical Activity: Not on file  Stress: Not on file  Social Connections: Not on file  Family History: The patient's family history includes Diabetes in her sister. There is no history of Stomach cancer or Colon cancer.  ROS:   Please see the history of present illness.     All other systems reviewed and are negative.  EKGs/Labs/Other Studies Reviewed:    The following studies were reviewed today:   EKG: November 16, 2021: Atrial fibrillation at 74.  Nonspecific ST and T wave changes.   Recent Labs: 04/18/2021: B Natriuretic Peptide 464.9; Magnesium 2.4 05/16/2021: Platelets 157 06/20/2021: ALT 15; TSH 1.382 08/01/2021: BUN 35; Creatinine, Ser 7.50; Hemoglobin 12.2; Potassium 3.8; Sodium 137  Recent Lipid Panel No results found for: "CHOL", "TRIG", "HDL", "CHOLHDL", "VLDL", "LDLCALC", "LDLDIRECT"   Risk Assessment/Calculations:    CHA2DS2-VASc Score = 3   This indicates a 3.2% annual risk of stroke. The patient's score is based  upon: CHF History: 0 HTN History: 0 Diabetes History: 0 Stroke History: 0 Vascular Disease History: 1 (aortic atherosclerosis, carotid disease) Age Score: 1 Gender Score: 1       Physical Exam:    Physical Exam: Blood pressure (!) 152/70, pulse 74, height 5\' 5"  (1.651 m), weight 160 lb 6.4 oz (72.8 kg).  GEN:  Well nourished, well developed in no acute distress HEENT: Normal NECK: No JVD; No carotid bruits LYMPHATICS: No lymphadenopathy CARDIAC: RRR 7-9/8 systolic murmur, 2/6 diastolic murmur  RESPIRATORY:  Clear to auscultation without rales, wheezing or rhonchi  ABDOMEN: Soft, non-tender, non-distended MUSCULOSKELETAL:  No edema; functioning left upper arm dialysis fistula  SKIN: Warm and dry NEUROLOGIC:  Alert and oriented x 3   ASSESSMENT:    No diagnosis found. PLAN:       Atrial fib: She is now been loaded with amiodarone.  We will try cardioversion again.  We discussed the risk, benefits, options of cardioversion.  She understands and agrees to proceed.   Hypertension: I will defer to her nephrologist for further management of her hypertension.  3.  Aortic insufficiency: She has moderate to severe aortic insufficiency.  She has an ascending aortic aneurysm of 47 mm.  4.  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  Will ask Dr. Roxan Hockey to look at the cardiac MR and provide on opinion.                Medication Adjustments/Labs and Tests Ordered: Current medicines are reviewed at length with the patient today.  Concerns regarding medicines are outlined above.  No orders of the defined types were placed in this encounter.  No orders of the defined types were placed in this encounter.   There are no Patient Instructions on file for this visit.   Signed, Mertie Moores, MD  11/16/2021 11:35 AM    [place]

## 2021-11-16 ENCOUNTER — Ambulatory Visit (INDEPENDENT_AMBULATORY_CARE_PROVIDER_SITE_OTHER): Payer: Medicare Other | Admitting: Cardiovascular Disease

## 2021-11-16 ENCOUNTER — Encounter: Payer: Self-pay | Admitting: Cardiovascular Disease

## 2021-11-16 VITALS — BP 152/70 | HR 74 | Ht 65.0 in | Wt 160.4 lb

## 2021-11-16 DIAGNOSIS — I4819 Other persistent atrial fibrillation: Secondary | ICD-10-CM | POA: Diagnosis not present

## 2021-11-16 NOTE — Patient Instructions (Signed)
Medication Instructions:  Your physician recommends that you continue on your current medications as directed. Please refer to the Current Medication list given to you today.  *If you need a refill on your cardiac medications before your next appointment, please call your pharmacy*   Lab Work: Bmet and CBC on 11/28/21 If you have labs (blood work) drawn today and your tests are completely normal, you will receive your results only by: [place] (if you have MyChart) OR A paper copy in the mail If you have any lab test that is abnormal or we need to change your treatment, we will call you to review the results.   Testing/Procedures: Your physician has recommended that you have a Cardioversion (DCCV). Electrical Cardioversion uses a jolt of electricity to your heart either through paddles or wired patches attached to your chest. This is a controlled, usually prescheduled, procedure. Defibrillation is done under light anesthesia in the hospital, and you usually go home the day of the procedure. This is done to get your heart back into a normal rhythm. You are not awake for the procedure. Please see the instruction sheet given to you today.    Follow-Up: At [facility], you and your health needs are our priority.  As part of our continuing mission to provide you with exceptional heart care, we have created designated Provider Care Teams.  These Care Teams include your primary Cardiologist (physician) and Advanced Practice Providers (APPs -  Physician Assistants and Nurse Practitioners) who all work together to provide you with the care you need, when you need it.  We recommend signing up for the patient portal called "MyChart".  Sign up information is provided on this After Visit Summary.  MyChart is used to connect with patients for Virtual Visits (Telemedicine).  Patients are able to view lab/test results, encounter notes, upcoming appointments, etc.  Non-urgent messages can be sent to your  provider as well.   To learn more about what you can do with MyChart, go to [email or web address].    Your next appointment:   3 month(s)  The format for your next appointment:   In Person  Provider:   Mertie Moores, MD     Other Instructions You are scheduled for a Cardioversion on 12/01/21 with Dr. Oval Linsey.  Please arrive at the [facility] (Main Entrance A) at [facility]: [address] [place], [place] [address] at 9 am. (1 hour prior to procedure unless lab work is needed; if lab work is needed arrive 1.5 hours ahead)  DIET: Nothing to eat or drink after midnight except a sip of water with medications (see medication instructions below)  FYI: For your safety, and to allow [place] to monitor your vital signs accurately during the surgery/procedure we request that   if you have artificial nails, gel coating, SNS etc. Please have those removed prior to your surgery/procedure. Not having the nail coverings /polish removed may result in cancellation or delay of your surgery/procedure.    Continue your anticoagulant: Eliquis You will need to continue your anticoagulant after your procedure until you  are told by your  Provider that it is safe to stop   Labs:  On 11/28/21 Come to the lab at [facility] between the hours of 8:00 am and 4:30 pm. You do not have to be fasting.  You must have a responsible person to drive you home and stay in the waiting area during your procedure. Failure to do so could result in cancellation.  Bring your insurance cards.  *Special Note: Every effort is made to have your procedure done on time. Occasionally there are emergencies that occur at the hospital that may cause delays. Please be patient if a delay does occur.

## 2021-11-24 ENCOUNTER — Encounter (HOSPITAL_COMMUNITY): Payer: Self-pay | Admitting: Cardiovascular Disease

## 2021-11-27 ENCOUNTER — Telehealth (HOSPITAL_BASED_OUTPATIENT_CLINIC_OR_DEPARTMENT_OTHER): Payer: Self-pay | Admitting: Cardiovascular Disease

## 2021-11-27 NOTE — Telephone Encounter (Signed)
[address] pt

## 2021-11-27 NOTE — Telephone Encounter (Signed)
Vaughan Basta is calling requesting a callback to reschedule the patient's cardioversion.

## 2021-11-28 ENCOUNTER — Other Ambulatory Visit: Payer: Medicare Other

## 2021-11-28 DIAGNOSIS — I4819 Other persistent atrial fibrillation: Secondary | ICD-10-CM

## 2021-11-28 NOTE — Telephone Encounter (Signed)
Returned call to patient who states she does dialysis on M/W/F and is okay with the DCCV for any upcoming Tuesday or Thursday. Pt was seen in clinic on 11/16/21. Advised if we can reschedule within 7 days, she won't need repeat labs.  Spoke with hailey at endoscopy and had procedure moved from this Friday 7/21 to this upcoming Tuesday 7/25 @0730  w/ Tobb.

## 2021-11-29 LAB — CBC
Hematocrit: 34.6 % (ref 34.0–46.6)
Hemoglobin: 11.5 g/dL (ref 11.1–15.9)
MCH: 30.3 pg (ref 26.6–33.0)
MCHC: 33.2 g/dL (ref 31.5–35.7)
MCV: 91 fL (ref 79–97)
Platelets: 169 10*3/uL (ref 150–450)
RBC: 3.79 x10E6/uL (ref 3.77–5.28)
RDW: 13.4 % (ref 11.7–15.4)
WBC: 3.5 10*3/uL (ref 3.4–10.8)

## 2021-11-29 LAB — BASIC METABOLIC PANEL
BUN/Creatinine Ratio: 6 — ABNORMAL LOW (ref 12–28)
BUN: 45 mg/dL — ABNORMAL HIGH (ref 8–27)
CO2: 30 mmol/L — ABNORMAL HIGH (ref 20–29)
Calcium: 10.1 mg/dL (ref 8.7–10.3)
Chloride: 96 mmol/L (ref 96–106)
Creatinine, Ser: 7.79 mg/dL — ABNORMAL HIGH (ref 0.57–1.00)
Glucose: 88 mg/dL (ref 70–99)
Potassium: 4.3 mmol/L (ref 3.5–5.2)
Sodium: 142 mmol/L (ref 134–144)
eGFR: 5 mL/min/{1.73_m2} — ABNORMAL LOW (ref >59)

## 2021-12-04 ENCOUNTER — Other Ambulatory Visit (INDEPENDENT_AMBULATORY_CARE_PROVIDER_SITE_OTHER): Payer: Medicare Other | Admitting: Cardiovascular Disease

## 2021-12-04 DIAGNOSIS — I4819 Other persistent atrial fibrillation: Secondary | ICD-10-CM

## 2021-12-04 NOTE — Anesthesia Preprocedure Evaluation (Addendum)
Anesthesia Evaluation  Patient identified by MRN, date of birth, ID band Patient awake    Reviewed: Allergy & Precautions, NPO status , Patient's Chart, lab work & pertinent test results  Airway Mallampati: II  TM Distance: >3 FB Neck ROM: Full    Dental no notable dental hx.    Pulmonary neg pulmonary ROS, former smoker,    Pulmonary exam normal        Cardiovascular + CAD  + dysrhythmias Atrial Fibrillation  Rhythm:Irregular Rate:Normal     Neuro/Psych negative neurological ROS  negative psych ROS   GI/Hepatic negative GI ROS, Neg liver ROS,   Endo/Other  negative endocrine ROS  Renal/GU ESRF and DialysisRenal disease  negative genitourinary   Musculoskeletal  (+) Arthritis , Osteoarthritis,    Abdominal Normal abdominal exam  (+)   Peds  Hematology negative hematology ROS (+)   Anesthesia Other Findings   Reproductive/Obstetrics                            Anesthesia Physical Anesthesia Plan  ASA: 3  Anesthesia Plan: General   Post-op Pain Management:    Induction: Intravenous  PONV Risk Score and Plan: 2 and Propofol infusion and Treatment may vary due to age or medical condition  Airway Management Planned: Mask  Additional Equipment: None  Intra-op Plan:   Post-operative Plan:   Informed Consent: I have reviewed the patients History and Physical, chart, labs and discussed the procedure including the risks, benefits and alternatives for the proposed anesthesia with the patient or authorized representative who has indicated his/her understanding and acceptance.     Dental advisory given  Plan Discussed with: CRNA  Anesthesia Plan Comments: (Lab Results      Component                Value               Date                      WBC                      3.5                 11/28/2021                HGB                      11.5                11/28/2021                 HCT                      34.6                11/28/2021                MCV                      91                  11/28/2021                PLT                      169  11/28/2021           Lab Results      Component                Value               Date                      NA                       142                 11/28/2021                K                        4.3                 11/28/2021                CO2                      30 (H)              11/28/2021                GLUCOSE                  88                  11/28/2021                BUN                      45 (H)              11/28/2021                CREATININE               7.79 (H)            11/28/2021                CALCIUM                  10.1                11/28/2021                EGFR                     5 (L)               11/28/2021                GFRNONAA                 4 (L)               06/20/2021            ECHO: 1. Left ventricular ejection fraction, by estimation, is 55 to 60%. The  left ventricle has normal function. The left ventricle has no regional  wall motion abnormalities. The left ventricular internal cavity size was  mildly dilated. There is moderate  left ventricular hypertrophy. Left ventricular diastolic parameters are  indeterminate.  2. Right ventricular systolic function is mildly reduced. The right  ventricular  size is normal. There is normal pulmonary artery systolic  pressure. The estimated right ventricular systolic pressure is 24.4 mmHg.  3. Left atrial size was severely dilated.  4. Right atrial size was moderately dilated.  5. The mitral valve is normal in structure. Mild mitral valve  regurgitation.  6. The aortic valve is bicuspid. Aortic valve regurgitation is moderate  to severe. Aortic valve sclerosis is present, with no evidence of aortic  valve stenosis.  7. The inferior vena cava is dilated in size with >50% respiratory  variability,  suggesting right atrial pressure of 8 mmHg.  8. Aneurysm of the ascending aorta, measuring 47 mm. )     Anesthesia Quick Evaluation

## 2021-12-04 NOTE — H&P (View-Only) (Signed)
Orders for cardioversion  

## 2021-12-04 NOTE — Progress Notes (Signed)
Orders for cardioversion  

## 2021-12-05 ENCOUNTER — Ambulatory Visit (HOSPITAL_BASED_OUTPATIENT_CLINIC_OR_DEPARTMENT_OTHER): Payer: Medicare Other | Admitting: Anesthesiology

## 2021-12-05 ENCOUNTER — Ambulatory Visit (HOSPITAL_COMMUNITY): Payer: Medicare Other | Admitting: Anesthesiology

## 2021-12-05 ENCOUNTER — Ambulatory Visit (HOSPITAL_COMMUNITY)
Admission: RE | Admit: 2021-12-05 | Discharge: 2021-12-05 | Disposition: A | Discharge status: Home / Self Care | Payer: Medicare Other | Attending: Cardiology | Admitting: Cardiology

## 2021-12-05 ENCOUNTER — Encounter (HOSPITAL_COMMUNITY): Payer: Self-pay | Admitting: Cardiology

## 2021-12-05 ENCOUNTER — Encounter (HOSPITAL_COMMUNITY)
Admission: RE | Disposition: A | Discharge status: Home / Self Care | Payer: Self-pay | Source: Home / Self Care | Attending: Cardiology

## 2021-12-05 ENCOUNTER — Other Ambulatory Visit: Payer: Self-pay

## 2021-12-05 DIAGNOSIS — I4891 Unspecified atrial fibrillation: Secondary | ICD-10-CM | POA: Insufficient documentation

## 2021-12-05 DIAGNOSIS — I351 Nonrheumatic aortic (valve) insufficiency: Secondary | ICD-10-CM | POA: Diagnosis not present

## 2021-12-05 DIAGNOSIS — Z79899 Other long term (current) drug therapy: Secondary | ICD-10-CM | POA: Diagnosis not present

## 2021-12-05 DIAGNOSIS — I6529 Occlusion and stenosis of unspecified carotid artery: Secondary | ICD-10-CM | POA: Insufficient documentation

## 2021-12-05 DIAGNOSIS — I2510 Atherosclerotic heart disease of native coronary artery without angina pectoris: Secondary | ICD-10-CM | POA: Diagnosis not present

## 2021-12-05 DIAGNOSIS — N186 End stage renal disease: Secondary | ICD-10-CM | POA: Diagnosis not present

## 2021-12-05 DIAGNOSIS — I120 Hypertensive chronic kidney disease with stage 5 chronic kidney disease or end stage renal disease: Secondary | ICD-10-CM | POA: Diagnosis not present

## 2021-12-05 DIAGNOSIS — Z992 Dependence on renal dialysis: Secondary | ICD-10-CM | POA: Insufficient documentation

## 2021-12-05 DIAGNOSIS — Z7901 Long term (current) use of anticoagulants: Secondary | ICD-10-CM | POA: Insufficient documentation

## 2021-12-05 DIAGNOSIS — I700 Atherosclerosis of aorta: Secondary | ICD-10-CM | POA: Insufficient documentation

## 2021-12-05 DIAGNOSIS — Z87891 Personal history of nicotine dependence: Secondary | ICD-10-CM | POA: Diagnosis not present

## 2021-12-05 DIAGNOSIS — I281 Aneurysm of pulmonary artery: Secondary | ICD-10-CM | POA: Diagnosis not present

## 2021-12-05 HISTORY — PX: CARDIOVERSION: SHX1299

## 2021-12-05 LAB — POCT I-STAT, CHEM 8
BUN: 60 mg/dL — ABNORMAL HIGH (ref 8–23)
Calcium, Ion: 0.98 mmol/L — ABNORMAL LOW (ref 1.15–1.40)
Chloride: 98 mmol/L (ref 98–111)
Creatinine, Ser: 8.6 mg/dL — ABNORMAL HIGH (ref 0.44–1.00)
Glucose, Bld: 83 mg/dL (ref 70–99)
HCT: 35 % — ABNORMAL LOW (ref 36.0–46.0)
Hemoglobin: 11.9 g/dL — ABNORMAL LOW (ref 12.0–15.0)
Potassium: 4.9 mmol/L (ref 3.5–5.1)
Sodium: 134 mmol/L — ABNORMAL LOW (ref 135–145)
TCO2: 31 mmol/L (ref 22–32)

## 2021-12-05 SURGERY — CARDIOVERSION
Anesthesia: General

## 2021-12-05 MED ORDER — SODIUM CHLORIDE 0.9 % IV SOLN: INTRAVENOUS | Status: DC | PRN

## 2021-12-05 MED ORDER — LIDOCAINE 2% (20 MG/ML) 5 ML SYRINGE
INTRAMUSCULAR | Status: DC | PRN
  Administered 2021-12-05: 50 mg via INTRAVENOUS

## 2021-12-05 MED ORDER — PROPOFOL 10 MG/ML IV BOLUS
INTRAVENOUS | Status: DC | PRN
  Administered 2021-12-05: 60 mg via INTRAVENOUS

## 2021-12-05 NOTE — Anesthesia Procedure Notes (Signed)
Procedure Name: General with mask airway Date/Time: 12/05/2021 7:50 AM  Performed by: Dorann Lodge, CRNAPre-anesthesia Checklist: Patient identified, Emergency Drugs available, Suction available and Patient being monitored Patient Re-evaluated:Patient Re-evaluated prior to induction Oxygen Delivery Method: Ambu bag Preoxygenation: Pre-oxygenation with 100% oxygen Induction Type: IV induction Dental Injury: Teeth and Oropharynx as per pre-operative assessment

## 2021-12-05 NOTE — Anesthesia Postprocedure Evaluation (Signed)
Anesthesia Post Note  Patient: [name]  Procedure(s) Performed: CARDIOVERSION     Patient location during evaluation: Endoscopy Anesthesia Type: General Level of consciousness: awake and alert Pain management: pain level controlled Vital Signs Assessment: post-procedure vital signs reviewed and stable Respiratory status: spontaneous breathing, nonlabored ventilation and respiratory function stable Cardiovascular status: blood pressure returned to baseline and stable Postop Assessment: no apparent nausea or vomiting Anesthetic complications: no   No notable events documented.  Last Vitals:  Vitals:   12/05/21 0810 12/05/21 0820  BP: 126/65 (!) 151/61  Pulse: (!) 57 (!) 58  Resp: (!) 25 (!) 24  Temp:    SpO2: 95% 91%    Last Pain:  Vitals:   12/05/21 0820  TempSrc:   PainSc: 0-No pain                 [name]

## 2021-12-05 NOTE — Transfer of Care (Signed)
Immediate Anesthesia Transfer of Care Note  Patient: Gloria Gallagher  Procedure(s) Performed: CARDIOVERSION  Patient Location: Endoscopy Unit  Anesthesia Type:General  Level of Consciousness: drowsy  Airway & Oxygen Therapy: Patient Spontanous Breathing and Patient connected to nasal cannula oxygen  Post-op Assessment: Report given to RN and Post -op Vital signs reviewed and stable  Post vital signs: Reviewed and stable  Last Vitals:  Vitals Value Taken Time  BP 177/66 (94)   Temp    Pulse 58   Resp 16   SpO2 92     Last Pain:  Vitals:   12/05/21 0717  TempSrc: Temporal  PainSc: 0-No pain         Complications: No notable events documented.

## 2021-12-05 NOTE — Interval H&P Note (Signed)
History and Physical Interval Note:  12/05/2021 7:39 AM  Gloria Gallagher  has presented today for surgery, with the diagnosis of AFIB.  The various methods of treatment have been discussed with the patient and family. After consideration of risks, benefits and other options for treatment, the patient has consented to  Procedure(s): CARDIOVERSION (N/A) as a surgical intervention.  The patient's history has been reviewed, patient examined, no change in status, stable for surgery.  I have reviewed the patient's chart and labs.  Questions were answered to the patient's satisfaction.     Ceclia Koker

## 2021-12-05 NOTE — Discharge Instructions (Signed)
Electrical Cardioversion  Electrical cardioversion is the delivery of a jolt of electricity to restore a normal rhythm to the heart. A rhythm that is too fast or is not regular keeps the heart from pumping well. In this procedure, sticky patches or metal paddles are placed on the chest to deliver electricity to the heart from a device.  What can I expect after the procedure?  Your blood pressure, heart rate, breathing rate, and blood oxygen level will be monitored until you leave the hospital or clinic.  Your heart rhythm will be watched to make sure it does not change.  You may have some redness on the skin where the shocks were given.If this occurs, can use hydrocortisone cream or Aloe vera.  Follow these instructions at home:  Do not drive for 24 hours if you were given a sedative during your procedure.  Take over-the-counter and prescription medicines only as told by your health care provider.  Ask your health care provider how to check your pulse. Check it often.  Rest for 48 hours after the procedure or as told by your health care provider.  Avoid or limit your caffeine use as told by your health care provider.  Keep all follow-up visits as told by your health care provider. This is important.  Contact a health care provider if:  You feel like your heart is beating too quickly or your pulse is not regular.  You have a serious muscle cramp that does not go away.  Get help right away if:  You have discomfort in your chest.  You are dizzy or you feel faint.  You have trouble breathing or you are short of breath.  Your speech is slurred.  You have trouble moving an arm or leg on one side of your body.  Your fingers or toes turn cold or blue.  Summary  Electrical cardioversion is the delivery of a jolt of electricity to restore a normal rhythm to the heart.  This procedure may be done right away in an emergency or may be a scheduled procedure if the condition is not  an emergency.  Generally, this is a safe procedure.  After the procedure, check your pulse often as told by your health care provider.  This information is not intended to replace advice given to you by your health care provider. Make sure you discuss any questions you have with your health care provider. Document Revised: 12/01/2018 Document Reviewed: 12/01/2018 Elsevier Patient Education  2021 [employer]vier Inc.  

## 2021-12-05 NOTE — CV Procedure (Signed)
   Electrical Cardioversion Procedure Note Gloria Gallagher [id number] 08-May-1951  Procedure: Electrical Cardioversion Indications:  Atrial Fibrillation  Time Out: Verified patient identification, verified procedure,medications/allergies/relevent history reviewed, required imaging and test results available.  Performed  Procedure Details  The patient signed informed consent.   The patient was NPO past midnight. Has had therapeutic anticoagulation with Eliquis greater than 3 weeks. The patient denies any interruption of anticoagulation.  Anesthesia was administered by Dr. Carlota Raspberry.  Adequate airway was maintained throughout and vital followed per protocol.  He was cardioverted x 3 with 200 J of biphasic synchronized energy.  He converted to NSR.  There were no apparent complications.  The patient tolerated the procedure well and had normal neuro status and respiratory status post procedure with vitals stable as recorded elsewhere.     IMPRESSION:  Successful cardioversion of atrial fibrillation to sinus bradycardia.  Follow up:  We will arrange follow up with Dr. Cathie Olden.  He will continue on current medical therapy.  The patient advised to continue anticoagulation.  Gloria Gallagher 12/05/2021, 8:09 AM

## 2021-12-06 ENCOUNTER — Encounter (HOSPITAL_COMMUNITY): Payer: Self-pay | Admitting: Cardiology

## 2021-12-13 ENCOUNTER — Other Ambulatory Visit (HOSPITAL_COMMUNITY): Payer: Self-pay | Admitting: Physician Assistant

## 2021-12-13 DIAGNOSIS — L299 Pruritus, unspecified: Secondary | ICD-10-CM | POA: Insufficient documentation

## 2021-12-14 ENCOUNTER — Ambulatory Visit: Payer: Medicare Other | Admitting: Gastroenterology

## 2021-12-16 NOTE — H&P (Signed)
Completed day of procedure 

## 2021-12-18 NOTE — Progress Notes (Unsigned)
[facility] [place]  ASSESSMENT / PLAN: Gloria Gallagher is a 71 y.o. 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  Recommend the following which can slow the progression of atherosclerosis and reduce the risk of major adverse cardiac / limb events:  Complete cessation from all tobacco products. Blood glucose control with goal A1c < 7%. Blood pressure control with goal blood pressure < 140/90 mmHg. Lipid reduction therapy with goal LDL-C <100 mg/dL (<70 if symptomatic from PAD).  Aspirin 81mg  PO QD.  Atorvastatin 40-80mg  PO QD (or other "high intensity" statin therapy).  Follow up with [facility] PA in 1 year with ABI.   CHIEF COMPLAINT: Paresthesias in right foot  HISTORY OF PRESENT ILLNESS: Gloria Gallagher is a 71 y.o. female referred to clinic for evaluation of possible lower extremity arterial disease.  The patient describes paresthesias and electric discomfort in her right foot.  She does not report pain, per se.  She just describes an odd sensation which is bothersome to her.  She does not report symptoms classic of intermittent claudication.  She can walk without pain in her calves.  She has no ischemic ulcers about her foot  12/19/21: Patient returns to clinic for short interval follow-up.  She is doing well overall.  She describes claudication symptoms which are not disabling.  She has no ischemic rest pain.  She has no ulcers about her feet.  Past Medical History:  Diagnosis Date   Aortic atherosclerosis (HCC)    Arthritis    Atrial fibrillation (HCC)    CAD (coronary artery disease)    Dialysis patient ([place])    ESRD (end stage renal disease) ([place])    Gout    Heart murmur    Hyperparathyroidism ([place])    Renal disorder    Seasonal allergies     Past Surgical History:  Procedure Laterality Date   CARDIOVERSION N/A 05/16/2021   Procedure: CARDIOVERSION;  Surgeon: Nahser, Wonda Cheng, MD;  Location: [place];  Service: Cardiovascular;  Laterality: N/A;   CARDIOVERSION N/A 08/01/2021   Procedure: CARDIOVERSION;  Surgeon: Sanda Klein, MD;  Location: [place];  Service: Cardiovascular;  Laterality: N/A;   CARDIOVERSION N/A 12/05/2021   Procedure: CARDIOVERSION;  Surgeon: Berniece Salines, DO;  Location: [facility];  Service: Cardiovascular;  Laterality: N/A;    Family History  Problem Relation Age of Onset   Diabetes Sister    Stomach cancer Neg Hx    Colon cancer Neg Hx     Social History   Socioeconomic History   Marital status: Single    Spouse name: Not on file   Number of children: 1   Years of education: Not on file   Highest education level: Not on file  Occupational History   Occupation: retired  Tobacco Use   Smoking status: Former    Types: Cigarettes    Passive exposure: Never   Smokeless tobacco: Never   Tobacco comments:    Former smoker 04/25/2021  Vaping Use   Vaping Use: Never used  Substance and Sexual Activity   Alcohol use: Yes    Alcohol/week: 1.0 standard drink of alcohol    Types: 1 Cans of beer per week    Comment: 1 beer every other week 06/20/21   Drug use: Never   Sexual activity: Not Currently  Other Topics Concern   Not on file  Social History Narrative   Not on file   Social Determinants of Health  Financial Resource Strain: Not on file  Food Insecurity: Not on file  Transportation Needs: Not on file  Physical Activity: Not on file  Stress: Not on file  Social Connections: Not on file  Intimate Partner Violence: Not on file    Allergies  Allergen Reactions   Penicillin G Hives and Itching   Shrimp (Diagnostic) Hives    Current Outpatient Medications  Medication Sig Dispense Refill   amiodarone (PACERONE) 200 MG tablet Take 1 tablet (200 mg total) by mouth daily. 90 tablet 2   AURYXIA 1 GM 210 MG(Fe) tablet Take 630 mg by mouth 3 (three) times daily after meals.     Doxercalciferol (HECTOROL IV) During dialysis, 3X  week     ELIQUIS 5 MG TABS tablet TAKE 1 TABLET BY MOUTH TWICE  DAILY 180 tablet 3   multivitamin (RENA-VIT) TABS tablet Take 1 tablet by mouth daily.     No current facility-administered medications for this visit.    PHYSICAL EXAM Vitals:   12/19/21 0957  BP: (!) 158/69  Pulse: 60  Resp: 20  Temp: 98.1 F (36.7 C)  SpO2: 98%  Weight: 161 lb (73 kg)  Height: 5\' 6"  (1.676 m)     Constitutional: Chronically ill-appearing woman in no acute distress Cardiac: Regular rate and rhythm.  Respiratory:  unlabored. Abdominal:  soft, non-tender, non-distended.  Peripheral vascular: No palpable pedal pulses.  No ulcers about the heel, foot, toes, interdigital spaces.  PERTINENT LABORATORY AND RADIOLOGIC DATA  Most recent CBC    Latest Ref Rng & Units 12/05/2021    7:40 AM 11/28/2021    9:28 AM 08/01/2021   10:10 AM  CBC  WBC 3.4 - 10.8 x10E3/uL  3.5    Hemoglobin 12.0 - 15.0 g/dL 11.9  11.5  12.2   Hematocrit 36.0 - 46.0 % 35.0  34.6  36.0   Platelets 150 - 450 x10E3/uL  169       Most recent CMP    Latest Ref Rng & Units 12/05/2021    7:40 AM 11/28/2021    9:28 AM 08/01/2021   10:10 AM  CMP  Glucose 70 - 99 mg/dL 83  88  94   BUN 8 - 23 mg/dL 60  45  35   Creatinine 0.44 - 1.00 mg/dL 8.60  7.79  7.50   Sodium 135 - 145 mmol/L 134  142  137   Potassium 3.5 - 5.1 mmol/L 4.9  4.3  3.8   Chloride 98 - 111 mmol/L 98  96  94   CO2 20 - 29 mmol/L  30    Calcium 8.7 - 10.3 mg/dL  10.1       +-------+-----------+-----------+------------+------------+  ABI/TBIToday's ABIToday's TBIPrevious ABIPrevious TBI  +-------+-----------+-----------+------------+------------+  Right  0.39       0.11       0.43        0             +-------+-----------+-----------+------------+------------+  Left   0.42       0.24       0.50        0.12          +-------+-----------+-----------+------------+------------+   Yevonne Aline. Stanford Breed, MD [facility] Phone Number: [phone] 12/18/2021 10:30 AM  Total time spent on preparing this encounter including chart review, data review, collecting history, examining the patient, coordinating care for this established patient, 20 minutes.  Portions of this report may have been  transcribed using voice recognition software.  Every effort has been made to ensure accuracy; however, inadvertent computerized transcription errors may still be present.

## 2021-12-19 ENCOUNTER — Ambulatory Visit (HOSPITAL_COMMUNITY)
Admission: RE | Admit: 2021-12-19 | Discharge: 2021-12-19 | Disposition: A | Discharge status: Home / Self Care | Payer: Medicare Other | Source: Ambulatory Visit | Attending: Vascular Surgery | Admitting: Vascular Surgery

## 2021-12-19 ENCOUNTER — Ambulatory Visit (INDEPENDENT_AMBULATORY_CARE_PROVIDER_SITE_OTHER): Payer: Medicare Other | Admitting: Vascular Surgery

## 2021-12-19 ENCOUNTER — Encounter: Payer: Self-pay | Admitting: Vascular Surgery

## 2021-12-19 VITALS — BP 158/69 | HR 60 | Temp 98.1°F | Resp 20 | Ht 66.0 in | Wt 161.0 lb

## 2021-12-19 DIAGNOSIS — I739 Peripheral vascular disease, unspecified: Secondary | ICD-10-CM | POA: Diagnosis present

## 2021-12-19 DIAGNOSIS — N186 End stage renal disease: Secondary | ICD-10-CM | POA: Diagnosis not present

## 2021-12-19 DIAGNOSIS — Z992 Dependence on renal dialysis: Secondary | ICD-10-CM

## 2021-12-22 ENCOUNTER — Other Ambulatory Visit: Payer: Self-pay

## 2021-12-22 DIAGNOSIS — I739 Peripheral vascular disease, unspecified: Secondary | ICD-10-CM

## 2022-01-14 ENCOUNTER — Emergency Department (HOSPITAL_COMMUNITY): Payer: Medicare Other

## 2022-01-14 ENCOUNTER — Other Ambulatory Visit: Payer: Self-pay

## 2022-01-14 ENCOUNTER — Emergency Department (HOSPITAL_COMMUNITY)
Admission: EM | Admit: 2022-01-14 | Discharge: 2022-01-14 | Disposition: A | Discharge status: Home / Self Care | Payer: Medicare Other | Attending: Emergency Medicine | Admitting: Emergency Medicine

## 2022-01-14 DIAGNOSIS — W108XXA Fall (on) (from) other stairs and steps, initial encounter: Secondary | ICD-10-CM | POA: Diagnosis not present

## 2022-01-14 DIAGNOSIS — Z7901 Long term (current) use of anticoagulants: Secondary | ICD-10-CM | POA: Diagnosis not present

## 2022-01-14 DIAGNOSIS — R519 Headache, unspecified: Secondary | ICD-10-CM | POA: Diagnosis not present

## 2022-01-14 DIAGNOSIS — Y92813 Airplane as the place of occurrence of the external cause: Secondary | ICD-10-CM | POA: Insufficient documentation

## 2022-01-14 DIAGNOSIS — S12110A Anterior displaced Type II dens fracture, initial encounter for closed fracture: Secondary | ICD-10-CM | POA: Insufficient documentation

## 2022-01-14 DIAGNOSIS — M542 Cervicalgia: Secondary | ICD-10-CM | POA: Diagnosis present

## 2022-01-14 MED ORDER — DICLOFENAC SODIUM 1 % EX GEL
4.0000 g | Freq: Four times a day (QID) | CUTANEOUS | 0 refills | Status: DC
Start: 2022-01-14 — End: 2023-08-19

## 2022-01-14 MED ORDER — MORPHINE SULFATE 15 MG PO TABS: 7.5000 mg | ORAL_TABLET | ORAL | 0 refills | Status: DC | PRN

## 2022-01-14 MED ORDER — ACETAMINOPHEN 500 MG PO TABS
1000.0000 mg | ORAL_TABLET | Freq: Once | ORAL | Status: AC
  Administered 2022-01-14: 1000 mg via ORAL
  Filled 2022-01-14: qty 2

## 2022-01-14 MED ORDER — OXYCODONE HCL 5 MG PO TABS
2.5000 mg | ORAL_TABLET | Freq: Once | ORAL | Status: AC
  Administered 2022-01-14: 2.5 mg via ORAL
  Filled 2022-01-14: qty 1

## 2022-01-14 NOTE — Discharge Instructions (Signed)
Follow-up with the neurosurgeon in the office.  They want you to stay in the hard collar.    Use the gel as prescribed Also take tylenol 1000mg (2 extra strength) four times a day.   Then take the pain medicine if you feel like you need it. Narcotics do not help with the pain, they only make you care about it less.  You can become addicted to this, people may break into your house to steal it.  It will constipate you.  If you drive under the influence of this medicine you can get a DUI.

## 2022-01-14 NOTE — ED Triage Notes (Addendum)
Pt fell backward striking her head down a flight of stairs just prior to arrival. Pt remembers everything that happened. Pt c/o headache, neck pain, and left shoulder/arm pain. GCS 15. Pt takes Eliquis.

## 2022-01-14 NOTE — ED Provider Notes (Signed)
[facility] EMERGENCY DEPARTMENT Provider Note   CSN: [id number] Arrival date & time: 01/14/22  0142     History  Chief Complaint  Patient presents with   Lytle Michaels    ANZLEE HINESLEY is a 71 y.o. female.  71 yo F with a chief complaint of a fall.  The patient had gone upstairs to go to bed and she turned the lights off and realized that she needed to turn them on for some reason she could not find the switch and she fell down the stairs.  She tells me that she struck the back of her head for sure she had trouble getting up off the ground and called 911.  Complaining mostly of headache neck pain and left upper back pain.  She has had some pain to her left anterior chest wall.  Denies any difficulty breathing.  Denies abdominal pain.  Denies extremity pain.  She is on Eliquis.   Fall       Home Medications Prior to Admission medications   Medication Sig Start Date End Date Taking? Authorizing Provider  diclofenac Sodium (VOLTAREN) 1 % GEL Apply 4 g topically 4 (four) times daily. 01/14/22  Yes Deno Etienne, DO  morphine (MSIR) 15 MG tablet Take 0.5 tablets (7.5 mg total) by mouth every 4 (four) hours as needed for severe pain. 01/14/22  Yes Deno Etienne, DO  amiodarone (PACERONE) 200 MG tablet Take 1 tablet (200 mg total) by mouth daily. 10/19/21   Fenton, Clint R, PA  AURYXIA 1 GM 210 MG(Fe) tablet Take 630 mg by mouth 3 (three) times daily after meals. 11/13/21   [provider]  Doxercalciferol (HECTOROL IV) During dialysis, 3X week 11/08/21 11/07/22  [provider]  ELIQUIS 5 MG TABS tablet TAKE 1 TABLET BY MOUTH TWICE  DAILY 12/14/21   Fenton, Clint R, PA  multivitamin (RENA-VIT) TABS tablet Take 1 tablet by mouth daily. 07/19/21   [provider]      Allergies    Penicillin g and Shrimp (diagnostic)    Review of Systems   Review of Systems  Physical Exam Updated Vital Signs BP (!) 167/70   Pulse 62   Temp (!) 97.5 F (36.4 C) (Oral)    Resp (!) 21   Ht 5\' 3"  (1.6 m)   Wt 74.8 kg   SpO2 94%   BMI 29.23 kg/m  Physical Exam Vitals and nursing note reviewed.  Constitutional:      General: She is not in acute distress.    Appearance: She is well-developed. She is not diaphoretic.  HENT:     Head: Normocephalic and atraumatic.  Eyes:     Pupils: Pupils are equal, round, and reactive to light.  Cardiovascular:     Rate and Rhythm: Normal rate and regular rhythm.     Heart sounds: No murmur heard.    No friction rub. No gallop.  Pulmonary:     Effort: Pulmonary effort is normal.     Breath sounds: No wheezing or rales.  Abdominal:     General: There is no distension.     Palpations: Abdomen is soft.     Tenderness: There is no abdominal tenderness.  Musculoskeletal:        General: Tenderness present.     Cervical back: Normal range of motion and neck supple.     Comments: Tenderness to the occiput and the paraspinal musculature along the C-spine.  She also has some left paraspinal musculature about  the upper T-spine.  Palpated from head to toe without any other noted areas of bony tenderness.  Skin:    General: Skin is warm and dry.  Neurological:     Mental Status: She is alert and oriented to person, place, and time.  Psychiatric:        Behavior: Behavior normal.     ED Results / Procedures / Treatments   Labs (all labs ordered are listed, but only abnormal results are displayed) Labs Reviewed - No data to display  EKG EKG Interpretation  Date/Time:  Sunday January 14 2022 01:51:57 EDT Ventricular Rate:  63 PR Interval:  289 QRS Duration: 120 QT Interval:  508 QTC Calculation: 521 R Axis:   -54 Text Interpretation: Sinus or ectopic atrial rhythm Prolonged PR interval Incomplete left bundle branch block LVH with secondary repolarization abnormality Anterior Q waves, possibly due to LVH No significant change since last tracing Confirmed by Deno Etienne [phone]) on 01/14/2022 2:19:03 AM  Radiology CT  Head Wo Contrast  Result Date: 01/14/2022 CLINICAL DATA:  Head and neck trauma. EXAM: CT HEAD WITHOUT CONTRAST CT CERVICAL SPINE WITHOUT CONTRAST TECHNIQUE: Multidetector CT imaging of the head and cervical spine was performed following the standard protocol without intravenous contrast. Multiplanar CT image reconstructions of the cervical spine were also generated. RADIATION DOSE REDUCTION: This exam was performed according to the departmental dose-optimization program which includes automated exposure control, adjustment of the mA and/or kV according to patient size and/or use of iterative reconstruction technique. COMPARISON:  04/18/2021. FINDINGS: CT HEAD FINDINGS Brain: No acute intracranial hemorrhage, midline shift or mass effect. No extra-axial fluid collection. Diffuse atrophy is noted. Periventricular and subcortical white matter hypodensities are present bilaterally. No hydrocephalus. Vascular: No hyperdense vessel or unexpected calcification. Skull: No acute fracture. Sinuses/Orbits: No acute finding. Other: A large scalp hematoma is present over the frontoparietal regions bilaterally. There is partial opacification of the mastoid air cells on the right with sclerosis which is unchanged from the previous exam. CT CERVICAL SPINE FINDINGS Alignment: Normal. Skull base and vertebrae: There is a type 2 fracture at the base of the dens with 3 mm anterior displacement of the odontoid. The atlantoaxial articulation is within normal limits the remaining bony structures are intact Soft tissues and spinal canal: Mild paravertebral soft tissue edema. No visible canal hematoma. Disc levels: Intervertebral disc spaces maintained. No significant spinal canal stenosis. Upper chest: Emphysematous changes are present in the lungs. Other: Carotid artery calcifications. IMPRESSION: 1. No acute intracranial process. 2. Atrophy with chronic microvascular ischemic changes. 3. Large scalp hematoma over the frontal and  parietal regions bilaterally. 4. Type 2 dens fracture with 3 mm anterior displacement of the odontoid relative to the base Critical findings were reported to Dr. Deno Etienne at 3:25 a.m. Electronically Signed   By: Brett Fairy M.D.   On: 01/14/2022 03:26   CT Cervical Spine Wo Contrast  Result Date: 01/14/2022 CLINICAL DATA:  Head and neck trauma. EXAM: CT HEAD WITHOUT CONTRAST CT CERVICAL SPINE WITHOUT CONTRAST TECHNIQUE: Multidetector CT imaging of the head and cervical spine was performed following the standard protocol without intravenous contrast. Multiplanar CT image reconstructions of the cervical spine were also generated. RADIATION DOSE REDUCTION: This exam was performed according to the departmental dose-optimization program which includes automated exposure control, adjustment of the mA and/or kV according to patient size and/or use of iterative reconstruction technique. COMPARISON:  04/18/2021. FINDINGS: CT HEAD FINDINGS Brain: No acute intracranial hemorrhage, midline shift or mass effect.  No extra-axial fluid collection. Diffuse atrophy is noted. Periventricular and subcortical white matter hypodensities are present bilaterally. No hydrocephalus. Vascular: No hyperdense vessel or unexpected calcification. Skull: No acute fracture. Sinuses/Orbits: No acute finding. Other: A large scalp hematoma is present over the frontoparietal regions bilaterally. There is partial opacification of the mastoid air cells on the right with sclerosis which is unchanged from the previous exam. CT CERVICAL SPINE FINDINGS Alignment: Normal. Skull base and vertebrae: There is a type 2 fracture at the base of the dens with 3 mm anterior displacement of the odontoid. The atlantoaxial articulation is within normal limits the remaining bony structures are intact Soft tissues and spinal canal: Mild paravertebral soft tissue edema. No visible canal hematoma. Disc levels: Intervertebral disc spaces maintained. No significant  spinal canal stenosis. Upper chest: Emphysematous changes are present in the lungs. Other: Carotid artery calcifications. IMPRESSION: 1. No acute intracranial process. 2. Atrophy with chronic microvascular ischemic changes. 3. Large scalp hematoma over the frontal and parietal regions bilaterally. 4. Type 2 dens fracture with 3 mm anterior displacement of the odontoid relative to the base Critical findings were reported to Dr. Deno Etienne at 3:25 a.m. Electronically Signed   By: Brett Fairy M.D.   On: 01/14/2022 03:26   DG Thoracic Spine W/Swimmers  Result Date: 01/14/2022 CLINICAL DATA:  Fall and back pain. EXAM: THORACIC SPINE - 3 VIEWS COMPARISON:  Chest radiograph dated 01/14/2022. FINDINGS: Evaluation is limited due to osteopenia and degenerative changes. There is fracture of the C2 vertebra, better evaluated on the accompanying cervical spine CT. No obvious acute fracture of the thoracic spine. Multilevel degenerative changes. IMPRESSION: 1. No acute fracture of the thoracic spine. 2. C2 fracture, better evaluated on the accompanying cervical spine CT. Electronically Signed   By: Anner Crete M.D.   On: 01/14/2022 03:14   DG Chest Port 1 View  Result Date: 01/14/2022 CLINICAL DATA:  Fall, trauma. EXAM: PORTABLE CHEST 1 VIEW COMPARISON:  04/18/2021. FINDINGS: Heart is markedly enlarged and the pulmonary vasculature is distended. Interstitial and airspace opacities are noted in the lungs bilaterally. No consolidation, effusion, or pneumothorax. Stable calcifications are noted in the region of the scapula, possible loose bodies. No acute osseous abnormality. IMPRESSION: 1. Cardiomegaly with pulmonary vascular congestion. 2. Interstitial and airspace opacities in the lungs bilaterally, possible edema or infiltrate. Electronically Signed   By: Brett Fairy M.D.   On: 01/14/2022 02:47    Procedures Procedures    Medications Ordered in ED Medications  acetaminophen (TYLENOL) tablet 1,000 mg (1,000  mg Oral Given 01/14/22 0223)  oxyCODONE (Oxy IR/ROXICODONE) immediate release tablet 2.5 mg (2.5 mg Oral Given 01/14/22 0223)    ED Course/ Medical Decision Making/ A&P                           Medical Decision Making Amount and/or Complexity of Data Reviewed Radiology: ordered.  Risk OTC drugs. Prescription drug management.   71 yo F with a cc of a fall down the stairs.  Patient lost he way in the dark and fell down.  Pain to the posterior head and neck and upper back.    Ct head, C spine, Dg t spine.   CT head without intercranial injury CT of the C-spine with a odontoid type II fracture.  I discussed the case with neurosurgery on-call Reinaldo Meeker, NP recommends hard collar at all times follow-up in the office in about 4 weeks.  4:37 AM:  I have discussed the diagnosis/risks/treatment options with the patient.  Evaluation and diagnostic testing in the emergency department does not suggest an emergent condition requiring admission or immediate intervention beyond what has been performed at this time.  They will follow up with Neurosurgery. We also discussed returning to the ED immediately if new or worsening sx occur. We discussed the sx which are most concerning (e.g., sudden worsening pain, fever, inability to tolerate by mouth) that necessitate immediate return. Medications administered to the patient during their visit and any new prescriptions provided to the patient are listed below.  Medications given during this visit Medications  acetaminophen (TYLENOL) tablet 1,000 mg (1,000 mg Oral Given 01/14/22 0223)  oxyCODONE (Oxy IR/ROXICODONE) immediate release tablet 2.5 mg (2.5 mg Oral Given 01/14/22 0223)     The patient appears reasonably screen and/or stabilized for discharge and I doubt any other medical condition or other [facility] requiring further screening, evaluation, or treatment in the ED at this time prior to discharge.          Final Clinical Impression(s) / ED Diagnoses Final  diagnoses:  Closed odontoid fracture with type II morphology and anterior displacement, initial encounter [facility])    Rx / DC Orders ED Discharge Orders          Ordered    morphine (MSIR) 15 MG tablet  Every 4 hours PRN        01/14/22 0419    diclofenac Sodium (VOLTAREN) 1 % GEL  4 times daily        01/14/22 Sheridan, [place], DO 01/14/22 [phone]

## 2022-01-14 NOTE — Progress Notes (Signed)
   01/14/22 0130  Clinical Encounter Type  Visited With Patient not available  Visit Type Initial;Trauma  Referral From Nurse  Consult/Referral To Chaplain   Chaplain responded to a level two trauma. Patient was under the care of the medical team.  No family is present. If a chaplain is requested someone will respond.   Danice Goltz [facility]  [phone]

## 2022-01-17 ENCOUNTER — Emergency Department (HOSPITAL_COMMUNITY): Payer: Medicare Other

## 2022-01-17 ENCOUNTER — Encounter (HOSPITAL_COMMUNITY): Payer: Self-pay

## 2022-01-17 ENCOUNTER — Observation Stay (HOSPITAL_COMMUNITY)
Admission: EM | Admit: 2022-01-17 | Discharge: 2022-01-19 | Disposition: A | Discharge status: Home Health | Payer: Medicare Other | Attending: Family Medicine | Admitting: Family Medicine

## 2022-01-17 ENCOUNTER — Other Ambulatory Visit: Payer: Self-pay

## 2022-01-17 DIAGNOSIS — R4182 Altered mental status, unspecified: Principal | ICD-10-CM

## 2022-01-17 DIAGNOSIS — Z87891 Personal history of nicotine dependence: Secondary | ICD-10-CM | POA: Diagnosis not present

## 2022-01-17 DIAGNOSIS — N186 End stage renal disease: Secondary | ICD-10-CM | POA: Diagnosis not present

## 2022-01-17 DIAGNOSIS — S12100A Unspecified displaced fracture of second cervical vertebra, initial encounter for closed fracture: Secondary | ICD-10-CM | POA: Diagnosis not present

## 2022-01-17 DIAGNOSIS — Z992 Dependence on renal dialysis: Secondary | ICD-10-CM | POA: Insufficient documentation

## 2022-01-17 DIAGNOSIS — I4819 Other persistent atrial fibrillation: Secondary | ICD-10-CM | POA: Diagnosis not present

## 2022-01-17 DIAGNOSIS — I120 Hypertensive chronic kidney disease with stage 5 chronic kidney disease or end stage renal disease: Secondary | ICD-10-CM | POA: Diagnosis not present

## 2022-01-17 DIAGNOSIS — S12110A Anterior displaced Type II dens fracture, initial encounter for closed fracture: Secondary | ICD-10-CM

## 2022-01-17 DIAGNOSIS — Z20822 Contact with and (suspected) exposure to covid-19: Secondary | ICD-10-CM | POA: Diagnosis not present

## 2022-01-17 DIAGNOSIS — R2681 Unsteadiness on feet: Secondary | ICD-10-CM | POA: Diagnosis not present

## 2022-01-17 DIAGNOSIS — M6281 Muscle weakness (generalized): Secondary | ICD-10-CM | POA: Insufficient documentation

## 2022-01-17 DIAGNOSIS — W1811XA Fall from or off toilet without subsequent striking against object, initial encounter: Secondary | ICD-10-CM | POA: Insufficient documentation

## 2022-01-17 DIAGNOSIS — Z79899 Other long term (current) drug therapy: Secondary | ICD-10-CM | POA: Diagnosis not present

## 2022-01-17 DIAGNOSIS — S12100D Unspecified displaced fracture of second cervical vertebra, subsequent encounter for fracture with routine healing: Secondary | ICD-10-CM

## 2022-01-17 DIAGNOSIS — D631 Anemia in chronic kidney disease: Secondary | ICD-10-CM | POA: Diagnosis not present

## 2022-01-17 DIAGNOSIS — I2510 Atherosclerotic heart disease of native coronary artery without angina pectoris: Secondary | ICD-10-CM | POA: Insufficient documentation

## 2022-01-17 DIAGNOSIS — Z7901 Long term (current) use of anticoagulants: Secondary | ICD-10-CM | POA: Diagnosis not present

## 2022-01-17 HISTORY — DX: End stage renal disease: N18.6

## 2022-01-17 HISTORY — DX: Dependence on renal dialysis: Z99.2

## 2022-01-17 LAB — CBC WITH DIFFERENTIAL/PLATELET
Abs Immature Granulocytes: 0.02 10*3/uL (ref 0.00–0.07)
Abs Immature Granulocytes: 0.01 10*3/uL (ref 0.00–0.07)
Basophils Absolute: 0.1 10*3/uL (ref 0.0–0.1)
Basophils Absolute: 0.1 10*3/uL (ref 0.0–0.1)
Basophils Relative: 1 %
Basophils Relative: 1 %
Eosinophils Absolute: 0 10*3/uL (ref 0.0–0.5)
Eosinophils Absolute: 0.1 10*3/uL (ref 0.0–0.5)
Eosinophils Relative: 1 %
Eosinophils Relative: 3 %
HCT: 33.5 % — ABNORMAL LOW (ref 36.0–46.0)
HCT: 35.5 % — ABNORMAL LOW (ref 36.0–46.0)
Hemoglobin: 10.9 g/dL — ABNORMAL LOW (ref 12.0–15.0)
Hemoglobin: 11.7 g/dL — ABNORMAL LOW (ref 12.0–15.0)
Immature Granulocytes: 0 %
Immature Granulocytes: 0 %
Lymphocytes Relative: 12 %
Lymphocytes Relative: 19 %
Lymphs Abs: 0.6 10*3/uL — ABNORMAL LOW (ref 0.7–4.0)
Lymphs Abs: 0.7 10*3/uL (ref 0.7–4.0)
MCH: 30.9 pg (ref 26.0–34.0)
MCH: 31.2 pg (ref 26.0–34.0)
MCHC: 32.5 g/dL (ref 30.0–36.0)
MCHC: 33 g/dL (ref 30.0–36.0)
MCV: 94.9 fL (ref 80.0–100.0)
MCV: 94.7 fL (ref 80.0–100.0)
Monocytes Absolute: 0.5 10*3/uL (ref 0.1–1.0)
Monocytes Absolute: 0.4 10*3/uL (ref 0.1–1.0)
Monocytes Relative: 9 %
Monocytes Relative: 10 %
Neutro Abs: 4.1 10*3/uL (ref 1.7–7.7)
Neutro Abs: 2.6 10*3/uL (ref 1.7–7.7)
Neutrophils Relative %: 77 %
Neutrophils Relative %: 67 %
Platelets: 179 10*3/uL (ref 150–400)
Platelets: 162 10*3/uL (ref 150–400)
RBC: 3.53 MIL/uL — ABNORMAL LOW (ref 3.87–5.11)
RBC: 3.75 MIL/uL — ABNORMAL LOW (ref 3.87–5.11)
RDW: 15 % (ref 11.5–15.5)
RDW: 14.9 % (ref 11.5–15.5)
WBC: 5.3 10*3/uL (ref 4.0–10.5)
WBC: 3.9 10*3/uL — ABNORMAL LOW (ref 4.0–10.5)
nRBC: 0 % (ref 0.0–0.2)
nRBC: 0 % (ref 0.0–0.2)

## 2022-01-17 LAB — I-STAT CHEM 8, ED
BUN: 80 mg/dL — ABNORMAL HIGH (ref 8–23)
Calcium, Ion: 1.15 mmol/L (ref 1.15–1.40)
Chloride: 96 mmol/L — ABNORMAL LOW (ref 98–111)
Creatinine, Ser: 14.4 mg/dL — ABNORMAL HIGH (ref 0.44–1.00)
Glucose, Bld: 92 mg/dL (ref 70–99)
HCT: 34 % — ABNORMAL LOW (ref 36.0–46.0)
Hemoglobin: 11.6 g/dL — ABNORMAL LOW (ref 12.0–15.0)
Potassium: 5.5 mmol/L — ABNORMAL HIGH (ref 3.5–5.1)
Sodium: 131 mmol/L — ABNORMAL LOW (ref 135–145)
TCO2: 30 mmol/L (ref 22–32)

## 2022-01-17 LAB — COMPREHENSIVE METABOLIC PANEL
ALT: 19 U/L (ref 0–44)
AST: 24 U/L (ref 15–41)
Albumin: 3.6 g/dL (ref 3.5–5.0)
Alkaline Phosphatase: 106 U/L (ref 38–126)
Anion gap: 17 — ABNORMAL HIGH (ref 5–15)
BUN: 71 mg/dL — ABNORMAL HIGH (ref 8–23)
CO2: 29 mmol/L (ref 22–32)
Calcium: 10.1 mg/dL (ref 8.9–10.3)
Chloride: 89 mmol/L — ABNORMAL LOW (ref 98–111)
Creatinine, Ser: 14.23 mg/dL — ABNORMAL HIGH (ref 0.44–1.00)
GFR, Estimated: 2 mL/min — ABNORMAL LOW (ref >60)
Glucose, Bld: 98 mg/dL (ref 70–99)
Potassium: 5.6 mmol/L — ABNORMAL HIGH (ref 3.5–5.1)
Sodium: 135 mmol/L (ref 135–145)
Total Bilirubin: 0.6 mg/dL (ref 0.3–1.2)
Total Protein: 6.7 g/dL (ref 6.5–8.1)

## 2022-01-17 LAB — CBG MONITORING, ED
Glucose-Capillary: 109 mg/dL — ABNORMAL HIGH (ref 70–99)
Glucose-Capillary: 47 mg/dL — ABNORMAL LOW (ref 70–99)
Glucose-Capillary: 99 mg/dL (ref 70–99)
Glucose-Capillary: 81 mg/dL (ref 70–99)

## 2022-01-17 LAB — BASIC METABOLIC PANEL
Anion gap: 16 — ABNORMAL HIGH (ref 5–15)
BUN: 34 mg/dL — ABNORMAL HIGH (ref 8–23)
CO2: 26 mmol/L (ref 22–32)
Calcium: 9.7 mg/dL (ref 8.9–10.3)
Chloride: 93 mmol/L — ABNORMAL LOW (ref 98–111)
Creatinine, Ser: 8.07 mg/dL — ABNORMAL HIGH (ref 0.44–1.00)
GFR, Estimated: 5 mL/min — ABNORMAL LOW (ref >60)
Glucose, Bld: 77 mg/dL (ref 70–99)
Potassium: 4.5 mmol/L (ref 3.5–5.1)
Sodium: 135 mmol/L (ref 135–145)

## 2022-01-17 LAB — HEPATITIS B SURFACE ANTIGEN: Hepatitis B Surface Ag: NONREACTIVE

## 2022-01-17 LAB — MAGNESIUM
Magnesium: 2.4 mg/dL (ref 1.7–2.4)
Magnesium: 2 mg/dL (ref 1.7–2.4)

## 2022-01-17 LAB — PHOSPHORUS
Phosphorus: 5.8 mg/dL — ABNORMAL HIGH (ref 2.5–4.6)
Phosphorus: 3.5 mg/dL (ref 2.5–4.6)

## 2022-01-17 LAB — RESP PANEL BY RT-PCR (FLU A&B, COVID) ARPGX2
Influenza A by PCR: NEGATIVE
Influenza B by PCR: NEGATIVE
SARS Coronavirus 2 by RT PCR: NEGATIVE

## 2022-01-17 LAB — HEPATITIS C ANTIBODY: HCV Ab: NONREACTIVE

## 2022-01-17 LAB — HEPATITIS B SURFACE ANTIBODY,QUALITATIVE: Hep B S Ab: REACTIVE — AB

## 2022-01-17 LAB — HEPATITIS B CORE ANTIBODY, TOTAL: Hep B Core Total Ab: NONREACTIVE

## 2022-01-17 MED ORDER — FERRIC CITRATE 1 GM 210 MG(FE) PO TABS
630.0000 mg | ORAL_TABLET | Freq: Three times a day (TID) | ORAL | Status: DC
  Administered 2022-01-18: 630 mg via ORAL
  Filled 2022-01-17 (×3): qty 3

## 2022-01-17 MED ORDER — SODIUM ZIRCONIUM CYCLOSILICATE 10 G PO PACK
10.0000 g | PACK | Freq: Once | ORAL | Status: DC
Start: 2022-01-17 — End: 2022-01-18

## 2022-01-17 MED ORDER — DEXTROSE 50 % IV SOLN
INTRAVENOUS | Status: AC
  Administered 2022-01-17: 50 mL via INTRAVENOUS
  Filled 2022-01-17: qty 50

## 2022-01-17 MED ORDER — HEPARIN SODIUM (PORCINE) 1000 UNIT/ML IJ SOLN
INTRAMUSCULAR | Status: AC
  Filled 2022-01-17: qty 3

## 2022-01-17 MED ORDER — ACETAMINOPHEN 500 MG PO TABS: 1000.0000 mg | ORAL_TABLET | Freq: Four times a day (QID) | ORAL | Status: DC | PRN

## 2022-01-17 MED ORDER — CHLORHEXIDINE GLUCONATE CLOTH 2 % EX PADS: 6.0000 | MEDICATED_PAD | Freq: Every day | CUTANEOUS | Status: DC

## 2022-01-17 MED ORDER — AMIODARONE HCL 200 MG PO TABS
200.0000 mg | ORAL_TABLET | Freq: Every day | ORAL | Status: DC
  Administered 2022-01-18 – 2022-01-19 (×2): 200 mg via ORAL
  Filled 2022-01-17 (×2): qty 1

## 2022-01-17 MED ORDER — ADULT MULTIVITAMIN W/MINERALS CH
1.0000 | ORAL_TABLET | Freq: Every day | ORAL | Status: DC
  Administered 2022-01-18 – 2022-01-19 (×2): 1 via ORAL
  Filled 2022-01-17 (×2): qty 1

## 2022-01-17 MED ORDER — DEXTROSE 50 % IV SOLN: 1.0000 | Freq: Once | INTRAVENOUS | Status: AC

## 2022-01-17 NOTE — ED Triage Notes (Signed)
Pt received to 32 via EMS. EMS called for head and neck pain this AM while pt getting ready for dialysis this AM. Pt reports missing dialysis on Monday and today. Pt had recent fall and neck fracture. Pt in c-collar upon arrival.

## 2022-01-17 NOTE — Assessment & Plan Note (Addendum)
Persistent A fib s/p 3 cardioversions this year. -On home Eliquis, CBC stable -Continue home med: Amiodarone 200mg  daily

## 2022-01-17 NOTE — ED Provider Notes (Signed)
[facility] Provider Note   CSN: [id number] Arrival date & time: 01/17/22  0720     History Chief Complaint  Patient presents with   Headache    Neck pain    Gloria Gallagher is a 71 y.o. female with h/o MWF dialysis, afib on eliquis, CAD, presents to the ED for evaluation of confusion and head and neck pain. The patient was recently seen here after a fall and was placed in a c-collar for odontoid fracture.  Close family friend is at bedside and gives the majority of the history.  Family friend reports that the patient was feeling more confused yesterday and was talking to people that were not there.  Daughter and family friend reports that the patient was confused yesterday and has been confused today.  She did not attend her dialysis session today or Monday.  She was given morphine tablets to go home with after her fall.  Today, the patient was taken off her c-collar to wash her face when she fell back onto her toilet.  Family friend said she was there to witness the fall and she fell back into the toilet however did not hit her head, but was not wearing her c-collar.  She is alert and oriented x2.   Headache Associated symptoms: neck pain        Home Medications Prior to Admission medications   Medication Sig Start Date End Date Taking? Authorizing Provider  amiodarone (PACERONE) 200 MG tablet Take 1 tablet (200 mg total) by mouth daily. 10/19/21   Fenton, Clint R, PA  AURYXIA 1 GM 210 MG(Fe) tablet Take 630 mg by mouth 3 (three) times daily after meals. 11/13/21   [provider]  diclofenac Sodium (VOLTAREN) 1 % GEL Apply 4 g topically 4 (four) times daily. 01/14/22   Deno Etienne, DO  Doxercalciferol (HECTOROL IV) During dialysis, 3X week 11/08/21 11/07/22  [provider]  ELIQUIS 5 MG TABS tablet TAKE 1 TABLET BY MOUTH TWICE  DAILY 12/14/21   Fenton, Clint R, PA  morphine (MSIR) 15 MG tablet Take 0.5 tablets (7.5 mg total) by mouth  every 4 (four) hours as needed for severe pain. 01/14/22   Deno Etienne, DO  multivitamin (RENA-VIT) TABS tablet Take 1 tablet by mouth daily. 07/19/21   [provider]      Allergies    Penicillin g and Shrimp (diagnostic)    Review of Systems   Review of Systems  Unable to perform ROS: Mental status change  Musculoskeletal:  Positive for neck pain.  Neurological:  Positive for headaches.  Psychiatric/Behavioral:  Positive for confusion.     Physical Exam Updated Vital Signs BP (!) 180/72   Pulse (!) 57   Temp 98.1 F (36.7 C) (Oral)   Resp (!) 21   Ht 5\' 5"  (1.651 m)   Wt 73 kg   SpO2 90%   BMI 26.79 kg/m  Physical Exam Vitals and nursing note reviewed.  Constitutional:      General: She is not in acute distress.    Appearance: Normal appearance. She is not ill-appearing or toxic-appearing.  HENT:     Head: Normocephalic and atraumatic.  Eyes:     General: No scleral icterus.    Pupils: Pupils are equal, round, and reactive to light.  Neck:     Comments: Unable to assess due to c-collar in place. Cardiovascular:     Rate and Rhythm: Normal rate and regular rhythm.  Pulmonary:  Effort: Pulmonary effort is normal.     Breath sounds: Normal breath sounds.  Abdominal:     General: Abdomen is flat. Bowel sounds are normal. There is distension.     Tenderness: There is no abdominal tenderness. There is no guarding.     Comments: Abdomen distended, yet pliable.  Normal active bowel sounds.  Nontender to palpation.  Musculoskeletal:        General: No deformity.  Skin:    General: Skin is warm and dry.  Neurological:     General: No focal deficit present.     Mental Status: She is alert. Mental status is at baseline.     Cranial Nerves: No cranial nerve deficit or facial asymmetry.     Sensory: No sensory deficit.     Comments: Generalized weakness however symmetric in patient's upper and lower bilateral extremities.  Patient moving all extremities with ease.   Patient was difficult to follow two-step commands, exam limited.  Oriented to person and place however not time.     ED Results / Procedures / Treatments   Labs (all labs ordered are listed, but only abnormal results are displayed) Labs Reviewed  I-STAT CHEM 8, ED - Abnormal; Notable for the following components:      Result Value   Sodium 131 (*)    Potassium 5.5 (*)    Chloride 96 (*)    BUN 80 (*)    Creatinine, Ser 14.40 (*)    Hemoglobin 11.6 (*)    HCT 34.0 (*)    All other components within normal limits  SARS CORONAVIRUS 2 BY RT PCR  RESP PANEL BY RT-PCR (FLU A&B, COVID) [id number]  CBC WITH DIFFERENTIAL/PLATELET  COMPREHENSIVE METABOLIC PANEL  PHOSPHORUS  MAGNESIUM    EKG EKG Interpretation  Date/Time:  Wednesday January 17 2022 09:34:52 EDT Ventricular Rate:  58 PR Interval:    QRS Duration: 133 QT Interval:  455 QTC Calculation: 447 R Axis:   -53 Text Interpretation: Junctional rhythm Left bundle branch block Confirmed by Octaviano Glow [phone]) on 01/17/2022 10:09:19 AM  Radiology CT Head Wo Contrast  Result Date: 01/17/2022 CLINICAL DATA:  Fall 3 days ago and noncompliance with neck brace. EXAM: CT HEAD WITHOUT CONTRAST CT CERVICAL SPINE WITHOUT CONTRAST TECHNIQUE: Multidetector CT imaging of the head and cervical spine was performed following the standard protocol without intravenous contrast. Multiplanar CT image reconstructions of the cervical spine were also generated. RADIATION DOSE REDUCTION: This exam was performed according to the departmental dose-optimization program which includes automated exposure control, adjustment of the mA and/or kV according to patient size and/or use of iterative reconstruction technique. COMPARISON:  CT head and cervical spine 01/14/2022 FINDINGS: CT HEAD FINDINGS Brain: There is no acute intracranial hemorrhage, extra-axial fluid collection, or acute infarct. Parenchymal volume is within normal limits. The ventricles are stable  in size. Patchy and confluent hypodensity throughout the supratentorial white matter likely reflecting chronic small vessel ischemic change is unchanged. There is no mass lesion.  There is no mass effect or midline shift. Vascular: There is calcification of the bilateral carotid siphons and vertebral arteries. Skull: Normal. Negative for fracture or focal lesion. Sinuses/Orbits: The paranasal sinuses are clear. The globes and orbits are unremarkable. Other: There is a small right mastoid effusion. The vertex scalp hematoma is similar in size. Additional soft tissue density nodularity throughout the scalp bilaterally near the vertex likely also reflect hematomas. CT CERVICAL SPINE FINDINGS Alignment: Normal. Skull base and vertebrae: Again seen is a type  2 fracture of the base of the dens now with 4 mm anterior displacement, minimally increased from 3 mm on the prior study. Atlantooccipital and atlantoaxial alignment is maintained. There is a nondisplaced fracture through the left C4 superior articulating process (9-36, unchanged. There is no widening of the adjacent facet joints. Vertebral body heights are preserved. There is no other evidence of acute fracture. There is no suspicious osseous lesion. Soft tissues and spinal canal: No prevertebral fluid or swelling. No visible canal hematoma. Disc levels: The disc heights are overall preserved preserved there is no significant spinal canal stenosis. Upper chest: Emphysema is again seen in the lung apices. Other: None. IMPRESSION: 1. Stable noncontrast head CT with no acute intracranial pathology. Unchanged scalp hematomas. 2. Type 2 dens fracture again seen with 4 mm anterior displacement of the odontoid relative to the base, minimally increased from 3 mm on the prior study. 3. Unchanged nondisplaced fracture through the left C4 superior articulating process. 4. No new acute fracture or traumatic malalignment of the cervical spine. Electronically Signed   By: Valetta Mole M.D.   On: 01/17/2022 11:05   CT Cervical Spine Wo Contrast  Result Date: 01/17/2022 CLINICAL DATA:  Fall 3 days ago and noncompliance with neck brace. EXAM: CT HEAD WITHOUT CONTRAST CT CERVICAL SPINE WITHOUT CONTRAST TECHNIQUE: Multidetector CT imaging of the head and cervical spine was performed following the standard protocol without intravenous contrast. Multiplanar CT image reconstructions of the cervical spine were also generated. RADIATION DOSE REDUCTION: This exam was performed according to the departmental dose-optimization program which includes automated exposure control, adjustment of the mA and/or kV according to patient size and/or use of iterative reconstruction technique. COMPARISON:  CT head and cervical spine 01/14/2022 FINDINGS: CT HEAD FINDINGS Brain: There is no acute intracranial hemorrhage, extra-axial fluid collection, or acute infarct. Parenchymal volume is within normal limits. The ventricles are stable in size. Patchy and confluent hypodensity throughout the supratentorial white matter likely reflecting chronic small vessel ischemic change is unchanged. There is no mass lesion.  There is no mass effect or midline shift. Vascular: There is calcification of the bilateral carotid siphons and vertebral arteries. Skull: Normal. Negative for fracture or focal lesion. Sinuses/Orbits: The paranasal sinuses are clear. The globes and orbits are unremarkable. Other: There is a small right mastoid effusion. The vertex scalp hematoma is similar in size. Additional soft tissue density nodularity throughout the scalp bilaterally near the vertex likely also reflect hematomas. CT CERVICAL SPINE FINDINGS Alignment: Normal. Skull base and vertebrae: Again seen is a type 2 fracture of the base of the dens now with 4 mm anterior displacement, minimally increased from 3 mm on the prior study. Atlantooccipital and atlantoaxial alignment is maintained. There is a nondisplaced fracture through the left C4  superior articulating process (9-36, unchanged. There is no widening of the adjacent facet joints. Vertebral body heights are preserved. There is no other evidence of acute fracture. There is no suspicious osseous lesion. Soft tissues and spinal canal: No prevertebral fluid or swelling. No visible canal hematoma. Disc levels: The disc heights are overall preserved preserved there is no significant spinal canal stenosis. Upper chest: Emphysema is again seen in the lung apices. Other: None. IMPRESSION: 1. Stable noncontrast head CT with no acute intracranial pathology. Unchanged scalp hematomas. 2. Type 2 dens fracture again seen with 4 mm anterior displacement of the odontoid relative to the base, minimally increased from 3 mm on the prior study. 3. Unchanged nondisplaced fracture through the  left C4 superior articulating process. 4. No new acute fracture or traumatic malalignment of the cervical spine. Electronically Signed   By: Valetta Mole M.D.   On: 01/17/2022 11:05    Procedures Procedures   Medications Ordered in ED Medications - No data to display  ED Course/ Medical Decision Making/ A&P Clinical Course as of 01/17/22 1903  Wed Jan 17, 2022  1011 This is a 71 yo female w/ ESRD on MWF dialysis, This is a presented to ED with some confusion.  She is accompanied by close family friend.  The patient was seen in the ER 3 days ago after a fall and diagnosed with a type II odontoid fracture, was placed in a C-spine collar hard per neurosurgery recommendations and recommended for outpatient follow-up.  She was also prescribed morphine 7.5 mg tablets.  She has taken 3 doses in total, with her last dose 2 days ago on Monday.  She has not had dialysis since last Friday.  She has missed Monday, as well as her scheduled morning dialysis today.  She does not make urine.  Family friend at the bedside reports the patient appears more confused over the past 24 hours, repeating herself, not behaving normally.  She  patient also had a fall today in the bathroom, which was witnessed, and fell backwards when trying to stand up.  She did not strike her head.  She is not complaining of pain.  On exam the patient appears stable with some hypertension and her vital signs, no acute respiratory distress, no hypoxia.  Her pupils are pinpoint, and she occasionally falls asleep.  Her C-spine collar is in place.  We have ordered repeat imaging of the head and the cervical spine to evaluate for new or worsening fracture.  She is otherwise neurologically intact.  I do understand some concern for confusion, which may be multifactorial, but related to the narcotic medicines, as well as missing dialysis twice.  We will need to check her BUN as well as her potassium levels.  She does not appear volume overloaded otherwise.  I advised that they hold any further opioid medicines and they have been doing so, [MT]  1229 PA provider spoke to the nephrologist on-call who is planning to take the patient for dialysis today.  We will reassess her mental status afterwards, if there is some lingering opioid effect this would likely resolve after dialysis.  If she remains confused she may require medical admission at that time. [MT]  [id number] Notified by nursing that Royston Cowper (daughter) can be reached at [phone].  [RP]  1324 History obtained per family states that she is staying with her daughter and that was confused since yesterday.  Typically is alert and oriented x3 and is able to take care of herself. [RP]    Clinical Course User Index [MT] Trifan, Carola Rhine, MD [RP] Fransico Meadow, MD                           Medical Decision Making Amount and/or Complexity of Data Reviewed Labs: ordered. Radiology: ordered.  Risk Prescription drug management.    71 year old female presents the emergency room for evaluation of head pain, neck pain, and confusion.  Differential diagnosis includes is not mended to uremia, electrolyte  abnormality, subdural bleed, dementia, delirium, morphine toxicity, worsening fracture.  Vital signs show elevated blood pressure 180/72, afebrile, borderline bradycardia, satting well on room air with any increased work of breathing.  Exam is pertinent for patient alert and oriented x2.  She appears somnolent however awakens to voice.  She is moving all extremities.  Given the patient had a new witnessed fall, will need to order CT head and neck to rule out any reinjury or worsening of the injury of her neck.  Additionally, will order labs given the patient is missed 2 days of dialysis.  I independently reviewed and interpreted the patient's labs.  CMP shows slightly increased potassium 5.6.  Decreased chloride at 89.  Glucose normal.  BUN elevated at 71 with creatinine of 14.23.  Anion gap of 17.  No LFT abnormalities.  Phosphorus elevated at 5.8.  Mag of 2.4.  CBC shows slight leukopenia at 5.3.  Hemoglobin at 10.9 which appears to be slightly decreased in the patient's baseline around 11.5.  Normal platelets.  CT imaging of the head is unremarkable.  CT of the neck shows 1. Stable noncontrast head CT with no acute intracranial pathology. Unchanged scalp hematomas. 2. Type 2 dens fracture again seen with 4 mm anterior displacement of the odontoid relative to the base, minimally increased from 3 mm on the prior study. 3. Unchanged nondisplaced fracture through the left C4 superior articulating process. 4. No new acute fracture or traumatic malalignment of the cervical spine.  11:11 AM Spoke with Dr. Melvia Heaps who recommended [facility] for the patient's hyperkalemia. Recommended dialysis and re-evaluation in the ER for the patient's confusion. Lokelma ordered.   5:40 PM Patient has returned from dialysis and is still confused. Alert and oriented to person and place, but not time. She did have to think about what her name was. Will re-order labs. Discussed this patient with oncoming attending.  Spoke with  daughter on phone who is up-to-date on plan so far.  5:41 [place] transferred to Dr. Philip Aspen at the end of my shift as the patient will require reassessment once labs/imaging have resulted. Patient presentation, ED course, and plan of care discussed with review of all pertinent labs and imaging. Please see his/her note for further details regarding further ED course and disposition. Plan at time of handoff is follow up on labs. Likely admission. This may be altered or completely changed at the discretion of the oncoming team pending results of further workup.    I discussed this case with my attending physician who cosigned this note including patient's presenting symptoms, physical exam, and planned diagnostics and interventions. Attending physician stated agreement with plan or made changes to plan which were implemented.   Attending physician assessed patient at bedside.  Final Clinical Impression(s) / ED Diagnoses Final diagnoses:  None    Rx / DC Orders ED Discharge Orders     None         Sherrell Puller, PA-C 01/17/22 1930    Wyvonnia Dusky, MD 01/18/22 [phone]

## 2022-01-17 NOTE — ED Notes (Signed)
CBG noted to be 47. Verbal order from Dr. Philip Aspen to give amp of d50. Medications given. Juice provided to patient.

## 2022-01-17 NOTE — ED Notes (Signed)
Patient transported to dialysis

## 2022-01-17 NOTE — ED Notes (Signed)
CBG recheck 109 - Dr. Sharlett Iles notified and at bedside.

## 2022-01-17 NOTE — ED Provider Notes (Signed)
Physical Exam  BP (!) 179/60   Pulse 65   Temp 97.8 F (36.6 C)   Resp (!) 23   Ht 5\' 5"  (1.651 m)   Wt 73 kg   SpO2 95%   BMI 26.78 kg/m   Physical Exam Vitals and nursing note reviewed.  Constitutional:      General: She is not in acute distress.    Appearance: She is well-developed.  HENT:     Head: Normocephalic and atraumatic.     Right Ear: External ear normal.     Left Ear: External ear normal.     Nose: Nose normal.  Eyes:     Extraocular Movements: Extraocular movements intact.     Conjunctiva/sclera: Conjunctivae normal.     Pupils: Pupils are equal, round, and reactive to light.  Neck:     Comments: C-collar in place Pulmonary:     Effort: Pulmonary effort is normal. No respiratory distress.  Abdominal:     General: Abdomen is flat. There is distension.  Musculoskeletal:        General: No swelling.  Skin:    General: Skin is warm and dry.     Capillary Refill: Capillary refill takes less than 2 seconds.  Neurological:     Mental Status: She is alert.     Comments: Alert and oriented to self and the fact that she was in a healthcare facility.  Did not know that she was at [facility] or the year.  No gross cranial nerve deficits.  Appears to be moving all 4 extremities equally.  Psychiatric:        Mood and Affect: Mood normal.     Procedures  Procedures  ED Course / MDM   Clinical Course as of 01/18/22 1046  Wed Jan 17, 2022  1011 This is a 71 yo female w/ ESRD on MWF dialysis, This is a presented to ED with some confusion.  She is accompanied by close family friend.  The patient was seen in the ER 3 days ago after a fall and diagnosed with a type II odontoid fracture, was placed in a C-spine collar hard per neurosurgery recommendations and recommended for outpatient follow-up.  She was also prescribed morphine 7.5 mg tablets.  She has taken 3 doses in total, with her last dose 2 days ago on Monday.  She has not had dialysis since last Friday.  She has  missed Monday, as well as her scheduled morning dialysis today.  She does not make urine.  Family friend at the bedside reports the patient appears more confused over the past 24 hours, repeating herself, not behaving normally.  She patient also had a fall today in the bathroom, which was witnessed, and fell backwards when trying to stand up.  She did not strike her head.  She is not complaining of pain.  On exam the patient appears stable with some hypertension and her vital signs, no acute respiratory distress, no hypoxia.  Her pupils are pinpoint, and she occasionally falls asleep.  Her C-spine collar is in place.  We have ordered repeat imaging of the head and the cervical spine to evaluate for new or worsening fracture.  She is otherwise neurologically intact.  I do understand some concern for confusion, which may be multifactorial, but related to the narcotic medicines, as well as missing dialysis twice.  We will need to check her BUN as well as her potassium levels.  She does not appear volume overloaded otherwise.  I  advised that they hold any further opioid medicines and they have been doing so, [MT]  1229 PA provider spoke to the nephrologist on-call who is planning to take the patient for dialysis today.  We will reassess her mental status afterwards, if there is some lingering opioid effect this would likely resolve after dialysis.  If she remains confused she may require medical admission at that time. [MT]  [id number] Notified by nursing that Royston Cowper (daughter) can be reached at [phone].  [RP]  7544 History obtained per family states that she is staying with her daughter and that was confused since yesterday.  Typically is alert and oriented x3 and is able to take care of herself. [RP]  2113 Patient was noted to be hypoglycemic in the emergency department after her dialysis.  She was given food to eat and had improvement of her blood sugar to WNL.  Rechecked her electrolytes which showed  that she no longer had a significant uremia.  Despite correction of these abnormalities and monitoring for several hours in the emergency department to ensure that it was not from polypharmacy due to her morphine patient was still confused so she was discussed with family medicine for admission. [RP]    Clinical Course User Index [MT] Trifan, Carola Rhine, MD [RP] Fransico Meadow, MD   Medical Decision Making Amount and/or Complexity of Data Reviewed Labs: ordered. Radiology: ordered.  Risk Prescription drug management. Decision regarding hospitalization.      Fransico Meadow, MD 01/18/22 1046

## 2022-01-17 NOTE — ED Notes (Signed)
Royston Cowper daughter [phone]

## 2022-01-17 NOTE — Assessment & Plan Note (Addendum)
HD scheduled for today. Nephrology following. -Labs stable -Mild anemia, likely d/t anemia of chronic disease

## 2022-01-17 NOTE — H&P (Addendum)
Hospital Admission History and Physical Service Pager: [phone]  Patient name: Gloria Gallagher Medical record number: [id number] Date of Birth: 17-Feb-1951 Age: 71 y.o. Gender: female  Primary Care Provider: Cipriano Mile, NP Consultants: nephrology  Code Status: FULL which was confirmed with family Preferred Emergency Contact: Veverly Fells [phone]  Chief Complaint: altered mental status   Assessment and Plan: Gloria Gallagher is a 71 y.o. female presenting with altered mental status. Differential includes medication changes (recent morphine), electrolyte abnormalities in the setting of missed dialysis, minor stroke, and substance use. Less suspicious for infectious etiology given patient is afebrile, no leukocytosis. Minor stroke possible in the setting of persistent symptoms (headache, dizziness) following patient's fall. CT head with no acute intracranial abnormality. Considered UDS to r/o substance  however patient is anuric. PMHx includes ESRD on dialysis (MWF), Afib, HTN. Odontoid fracture   * AMS (altered mental status) Patient presents with AMS since her recent fall and cervical fracture 3 days ago and subsequently missing 2 dialysis sessions. Alert and oriented only to name. At baseline able to perform iADLs. CT head unremarkable -admit to obs, tele medical. Attending Dr. Nori Riis.  -vitals per routine  -MRI brain wo contrast  -AM BMP,CBC, TSH, A1c  -PT/OT consult   ESRD (end stage renal disease) on dialysis [facility]) She missed 2 dialysis sessions this week which could be a contributor to her AMS and was persistently confused afterwards.  -nephrology following, appreciate recs  -am BMP    Odontoid fracture ([place]) S/p fall 3 days ago. Pain from odontoid fx could be contributing to her headache. Would want to minimize opioid use as possible contributor to her altered mental status. CT spine with minimally increased anterior displacement. -continue C collar  -minimize  opioids for pain management  - Tylenol 1000mg  q 6h  -consider reconsulting NSGY for recs  Persistent atrial fibrillation ([place]) Patient has had persistent A fib s/p 3 cardioversions. She is on eliquis and most recent dose was prior to hospitalization. CHADS-VASC score is 3.  -held eliquis in setting of unknown NSGY recs  -restart home amiodarone   FEN/GI: renal diet  VTE Prophylaxis: SCDs  Disposition: pending   History of Present Illness:  Gloria Gallagher is a 71 y.o. female presenting with altered mental status. History mainly provided by daughter. She reports patient has had ongoing headache, confusion, dizziness, and imbalance since fall 3 days ago. Daughter reports she has seemed more confused and fell again today in the bathroom and backwards when trying to stand up. She did not strike her head or lose consciousness. She denies any seizure-like activity. She reports patient has history of mild headaches but no migraines. She has had 3 1/2 tabs of morphine since she was last discharged from the ED 3 days ago and she missed dialysis on Monday but received dialysis at hospital today and continued to be confused after dialysis. Daughter reports waxing and waning mental status. She was seen in the ER 3 days ago after a fall and diagnosed with a type II odontoid fracture, was placed in a C-spine collar and recommended for outpatient follow-up per NSGY and discharged with 7.5 mg morphine tablets. She had taken 3 of them. Daughter reports that at baseline patient is able to care for herself and manage her finances. She was taking all her medications prior to coming to the hospital.   H&P at bedside limited by patients confusion  She states daughter is downstairs. She believes she is at niece's  house. Able to say her name. States year is 2021 or 2022, thinks its Friday because she does dialysis that day (MWF). She asks this Probation officer if she has a beer, states she thought she saw her with one. She states  this morning she was waking up getting ready for dialysis and then started swinging back and forth and daughter called rescue squad.  In the ED, repeat imaging of the head and the cervical spine to evaluate for new or worsening fracture showed minimally increased anterior displacement. No new acute fracture or traumatic malalignment of the cervical spine. CMP shows slightly increased potassium 5.6.  BUN 71. Cr 14.23.  No LFT abnormalities.  Phosphorus 5.8.  Mag of 2.4.  Hemoglobin 10.9, baseline around 11.5.  Normal platelets. Nephrology was consulted. Patient received lokelma for hyperkalemia and dialysis was recommended. She was still confused after dialysis with UOP 3L. CBG 47, got amp of D50.   Review Of Systems: Per HPI with the following additions: none  Pertinent Past Medical History: ESRD Aortic sclerosis Carotid artery disease Afib s/p cardioversion x3 Aortic insufficiency  HTN Pulmonary artery aneurysm  Remainder reviewed in history tab.   Pertinent Past Surgical History: N/A   Remainder reviewed in history tab.  Pertinent Social History: Tobacco use: Former Alcohol use: Yes, 1 beer every other week  Other Substance use: None Lives with daughter   Pertinent Family History: Diabetes (sister)   Remainder reviewed in history tab.   Important Outpatient Medications: Amiodarone 200mg  daily  Auryxia Fe tablet  Eliquis 5mg  BID   Remainder reviewed in medication history.   Objective: BP (!) 170/71   Pulse 72   Temp 98.6 F (37 C) (Oral)   Resp 17   Ht 5\' 5"  (1.651 m)   Wt 73 kg   SpO2 96%   BMI 26.78 kg/m  Exam: General: African American female sitting up in bed in no acute distress with C collar in place.  Eyes: EOM intact. PERRL.  Cardiovascular: Regular rate and rhythm. No m/r/g.  Respiratory: CTAB. Normal work of breathing on RA.  Gastrointestinal: Distended. Non-tender.  Ext: Noted AV fistula on L upper arm.  Neuro: alert and oriented to self. Not  oriented to time, place or situation. States daughter is downstairs. She believes she is at niece's house. No visual field cuts. Difficulty with FTN with L hand. 5/5 strength in BUE and 4/5 strength in RLE. 5/5 strength in LLE.  Psych: Stable mood and affect.   Labs:  CBC BMET  Recent Labs  Lab 01/17/22 1753  WBC 3.9*  HGB 11.7*  HCT 35.5*  PLT 162   Recent Labs  Lab 01/17/22 1753  NA 135  K 4.5  CL 93*  CO2 26  BUN 34*  CREATININE 8.07*  GLUCOSE 77  CALCIUM 9.7    Pertinent additional labs hepB NR .   EKG: My own interpretation: bradycardia.     Imaging Studies Performed:  Imaging Study (ie. Chest x-ray) Impression from Radiologist:   CT head: 1. Stable noncontrast head CT with no acute intracranial pathology. Unchanged scalp hematomas. CT spine: Type 2 dens fracture again seen with 4 mm anterior displacement of the odontoid relative to the base, minimally increased from 3 mm on the prior study. Unchanged nondisplaced fracture through the left C4 superior articulating process. No new acute fracture or traumatic malalignment of the cervical spine.   Rolanda Lundborg MD 01/18/2022, 12:00 AM PGY-1, [place] Intern pager: [phone], text pages  welcome Secure chat group [place] Upper-Level Resident Addendum   I have independently interviewed and examined the patient. I have discussed the above with the original author and agree with their documentation. My edits for correction/addition/clarification are included. Please see also any attending notes.   Sonia Side, D.O. PGY-3, [place] Medicine 01/18/2022 12:01 AM  FPTS Service pager: [phone] (text pages welcome through [facility])

## 2022-01-17 NOTE — Procedures (Signed)
   I was present at this dialysis session, have reviewed the session itself and made  appropriate changes Kelly Splinter MD [place] pager [phone]   01/17/2022, 2:43 PM

## 2022-01-17 NOTE — Assessment & Plan Note (Addendum)
Patient at baseline, AOx4. AMS likely d/t concussion vs. medication effect. -D/c with home health services -PT/OT following

## 2022-01-17 NOTE — ED Notes (Signed)
Received report from dialysis. Per dialysis rn, pt ended treatment 42 minutes early, 3.2L taken off total and vss at this time.

## 2022-01-17 NOTE — Progress Notes (Signed)
Pt presented for head and neck pain. Missed HD Monday, MWF esrd pt. K+ 5.6, creat 14. We are asked to see for dialysis. Pt seen in HD unit. She is not confused at this time, fully alert , Ox 3. No vol excess. Will return back to ED post HD for reassessment.    MWF [facility] 4h  400/1.5   72kg  2/2 bath  Hep 3000  LFA AVF - mircera 30 q 4 - hectorol 11 mcg tiw IV w/ hd - last HD 9/1 , came off at [place], MD 01/17/2022, 2:42 PM

## 2022-01-17 NOTE — Assessment & Plan Note (Addendum)
Pain well controlled without medication management. -continue C collar x 4 weeks  -Follow up neurosurgery outpatient -Avoid morphine per nephrology, utilize fentanyl if needs opioid management -PRN Tylenol 1000mg  q 6h

## 2022-01-17 NOTE — Progress Notes (Addendum)
Received patient in bed to unit.  Alert and oriented.  Informed consent signed and in chart.   Treatment initiated: 1315 Treatment completed: 1615  Patient tolerated tx well. Signed out early with 44min left on the machine; AMA form signed; Veneta Penton PA made aware. Transported back to the ED. Alert, without acute distress.  Hand-off given to patient's nurse.   Access used: AVF Access issues: none  Total UF removed: 3200 Medication(s) given: none Post HD VS: 98.2, 71,18, 150/65, 98% on 2L Post HD weight: 73kg   [facility]

## 2022-01-18 ENCOUNTER — Encounter (HOSPITAL_COMMUNITY): Payer: Self-pay | Admitting: Family Medicine

## 2022-01-18 DIAGNOSIS — S12100D Unspecified displaced fracture of second cervical vertebra, subsequent encounter for fracture with routine healing: Secondary | ICD-10-CM

## 2022-01-18 DIAGNOSIS — I4819 Other persistent atrial fibrillation: Secondary | ICD-10-CM | POA: Diagnosis not present

## 2022-01-18 DIAGNOSIS — R4182 Altered mental status, unspecified: Secondary | ICD-10-CM

## 2022-01-18 DIAGNOSIS — Z992 Dependence on renal dialysis: Secondary | ICD-10-CM

## 2022-01-18 DIAGNOSIS — N186 End stage renal disease: Secondary | ICD-10-CM | POA: Diagnosis not present

## 2022-01-18 LAB — BASIC METABOLIC PANEL
Anion gap: 14 (ref 5–15)
BUN: 40 mg/dL — ABNORMAL HIGH (ref 8–23)
CO2: 25 mmol/L (ref 22–32)
Calcium: 9.5 mg/dL (ref 8.9–10.3)
Chloride: 95 mmol/L — ABNORMAL LOW (ref 98–111)
Creatinine, Ser: 9.69 mg/dL — ABNORMAL HIGH (ref 0.44–1.00)
GFR, Estimated: 4 mL/min — ABNORMAL LOW (ref >60)
Glucose, Bld: 82 mg/dL (ref 70–99)
Potassium: 4.3 mmol/L (ref 3.5–5.1)
Sodium: 134 mmol/L — ABNORMAL LOW (ref 135–145)

## 2022-01-18 LAB — CBC
HCT: 32.1 % — ABNORMAL LOW (ref 36.0–46.0)
Hemoglobin: 10.5 g/dL — ABNORMAL LOW (ref 12.0–15.0)
MCH: 31 pg (ref 26.0–34.0)
MCHC: 32.7 g/dL (ref 30.0–36.0)
MCV: 94.7 fL (ref 80.0–100.0)
Platelets: 150 10*3/uL (ref 150–400)
RBC: 3.39 MIL/uL — ABNORMAL LOW (ref 3.87–5.11)
RDW: 14.8 % (ref 11.5–15.5)
WBC: 4.1 10*3/uL (ref 4.0–10.5)
nRBC: 0 % (ref 0.0–0.2)

## 2022-01-18 LAB — HEMOGLOBIN A1C
Hgb A1c MFr Bld: 5.3 % (ref 4.8–5.6)
Mean Plasma Glucose: 105.41 mg/dL

## 2022-01-18 LAB — CBG MONITORING, ED: Glucose-Capillary: 89 mg/dL (ref 70–99)

## 2022-01-18 LAB — HEPATITIS B SURFACE ANTIBODY, QUANTITATIVE: Hep B S AB Quant (Post): 29.7 m[IU]/mL (ref >9.9)

## 2022-01-18 LAB — TSH: TSH: 1.66 u[IU]/mL (ref 0.350–4.500)

## 2022-01-18 MED ORDER — APIXABAN 5 MG PO TABS
5.0000 mg | ORAL_TABLET | Freq: Two times a day (BID) | ORAL | Status: DC
  Administered 2022-01-18 – 2022-01-19 (×3): 5 mg via ORAL
  Filled 2022-01-18 (×3): qty 1

## 2022-01-18 MED ORDER — FERRIC CITRATE 1 GM 210 MG(FE) PO TABS
210.0000 mg | ORAL_TABLET | Freq: Three times a day (TID) | ORAL | Status: DC
  Administered 2022-01-18 – 2022-01-19 (×3): 210 mg via ORAL
  Filled 2022-01-18 (×5): qty 1

## 2022-01-18 MED ORDER — ATORVASTATIN CALCIUM 10 MG PO TABS
20.0000 mg | ORAL_TABLET | Freq: Every day | ORAL | Status: DC
  Administered 2022-01-18 – 2022-01-19 (×2): 20 mg via ORAL
  Filled 2022-01-18 (×2): qty 2

## 2022-01-18 MED ORDER — CHLORHEXIDINE GLUCONATE CLOTH 2 % EX PADS: 6.0000 | MEDICATED_PAD | Freq: Every day | CUTANEOUS | Status: DC

## 2022-01-18 MED ORDER — HEPARIN SODIUM (PORCINE) 1000 UNIT/ML DIALYSIS: 1500.0000 [IU] | INTRAMUSCULAR | Status: DC | PRN

## 2022-01-18 MED ORDER — DOXERCALCIFEROL 4 MCG/2ML IV SOLN
11.0000 ug | INTRAVENOUS | Status: DC
Start: 2022-01-19 — End: 2022-01-19
  Administered 2022-01-19: 11 ug via INTRAVENOUS
  Filled 2022-01-18 (×2): qty 6

## 2022-01-18 NOTE — Evaluation (Signed)
Physical Therapy Evaluation Patient Details Name: Gloria Gallagher MRN: [id number] DOB: 03-01-1951 Today's Date: 01/18/2022  History of Present Illness  Pt is a 71 y/o female admitted secondary to AMS and falls. Recent fall down the steps and found to have type 2 dens fx. PMH includes a fib and ESRD on HD.  Clinical Impression  Pt admitted secondary to problem above with deficits below. Pt with unsteadiness during gait and requiring min guard and HHA. Educated about using RW at home for increased safety. Pt's sister reports they are planning to have pt stay downstairs at their home. Recommending HHPT to address deficits. Will continue to follow acutely.        Recommendations for follow up therapy are one component of a multi-disciplinary discharge planning process, led by the attending physician.  Recommendations may be updated based on patient status, additional functional criteria and insurance authorization.  Follow Up Recommendations Home health PT      Assistance Recommended at Discharge Frequent or constant Supervision/Assistance (initially)  Patient can return home with the following  A little help with bathing/dressing/bathroom;A little help with walking and/or transfers;Assistance with cooking/housework;Assist for transportation;Help with stairs or ramp for entrance    Equipment Recommendations Rolling walker (2 wheels)  Recommendations for Other Services       Functional Status Assessment Patient has had a recent decline in their functional status and demonstrates the ability to make significant improvements in function in a reasonable and predictable amount of time.     Precautions / Restrictions Precautions Precautions: Cervical;Fall Precaution Booklet Issued: No Required Braces or Orthoses: Cervical Brace Cervical Brace: At all times;Hard collar Restrictions Weight Bearing Restrictions: No      Mobility  Bed Mobility Overal bed mobility: Needs Assistance Bed  Mobility: Rolling, Sidelying to Sit, Sit to Sidelying Rolling: Min assist Sidelying to sit: Min assist     Sit to sidelying: Min assist General bed mobility comments: Assist for trunk and LEs throughout.    Transfers Overall transfer level: Needs assistance Equipment used: 1 person hand held assist Transfers: Sit to/from Stand Sit to Stand: Min guard           General transfer comment: Min guard A for safety. Increased time required.    Ambulation/Gait Ambulation/Gait assistance: Min guard Gait Distance (Feet): 120 Feet Assistive device: 1 person hand held assist Gait Pattern/deviations: Step-through pattern, Decreased stride length Gait velocity: Decreased     General Gait Details: Slow, cautious gait. Unsteady and requiring HHA and min guard for safety throughout. Educated about using RW at home to increase safety.  Stairs            Wheelchair Mobility    Modified Rankin (Stroke Patients Only)       Balance Overall balance assessment: Needs assistance Sitting-balance support: No upper extremity supported, Feet supported Sitting balance-Leahy Scale: Good     Standing balance support: Single extremity supported Standing balance-Leahy Scale: Poor Standing balance comment: Reliant on UE support                             Pertinent Vitals/Pain Pain Assessment Pain Assessment: Faces Faces Pain Scale: Hurts little more Pain Location: neck Pain Descriptors / Indicators: Grimacing, Guarding Pain Intervention(s): Limited activity within patient's tolerance, Monitored during session, Repositioned    Home Living Family/patient expects to be discharged to:: Private residence Living Arrangements: Other relatives (sister) Available Help at Discharge: Family Type of Home: House Home Access:  Stairs to enter Entrance Stairs-Rails: None Entrance Stairs-Number of Steps: 1 Alternate Level Stairs-Number of Steps: flight Home Layout: Two level Home  Equipment: None Additional Comments: Plan is for pt to stay downstairs    Prior Function Prior Level of Function : Independent/Modified Independent                     Hand Dominance        Extremity/Trunk Assessment   Upper Extremity Assessment Upper Extremity Assessment: Defer to OT evaluation    Lower Extremity Assessment Lower Extremity Assessment: Generalized weakness    Cervical / Trunk Assessment Cervical / Trunk Assessment: Other exceptions Cervical / Trunk Exceptions: cervical fx  Communication   Communication: No difficulties  Cognition Arousal/Alertness: Awake/alert Behavior During Therapy: WFL for tasks assessed/performed Overall Cognitive Status: Impaired/Different from baseline Area of Impairment: Memory, Problem solving                     Memory: Decreased short-term memory       Problem Solving: Slow processing          General Comments General comments (skin integrity, edema, etc.): Pt's sister present    Exercises     Assessment/Plan    PT Assessment Patient needs continued PT services  PT Problem List Decreased strength;Decreased activity tolerance;Decreased balance;Decreased mobility;Decreased cognition;Decreased knowledge of use of DME;Decreased safety awareness;Decreased knowledge of precautions       PT Treatment Interventions DME instruction;Gait training;Functional mobility training;Stair training;Therapeutic activities;Therapeutic exercise;Balance training;Patient/family education    PT Goals (Current goals can be found in the Care Plan section)  Acute Rehab PT Goals Patient Stated Goal: to stop falling PT Goal Formulation: With patient Time For Goal Achievement: 02/01/22 Potential to Achieve Goals: Good    Frequency Min 3X/week     Co-evaluation               AM-PAC PT "6 Clicks" Mobility  Outcome Measure Help needed turning from your back to your side while in a flat bed without using bedrails?: A  Little Help needed moving from lying on your back to sitting on the side of a flat bed without using bedrails?: A Little Help needed moving to and from a bed to a chair (including a wheelchair)?: A Little Help needed standing up from a chair using your arms (e.g., wheelchair or bedside chair)?: A Little Help needed to walk in hospital room?: A Little Help needed climbing 3-5 steps with a railing? : A Lot 6 Click Score: 17    End of Session Equipment Utilized During Treatment: Gait belt Activity Tolerance: Patient tolerated treatment well Patient left: in bed;with call bell/phone within reach;with family/visitor present (in bed in ED) Nurse Communication: Mobility status PT Visit Diagnosis: Unsteadiness on feet (R26.81);Muscle weakness (generalized) (M62.81);History of falling (Z91.81)    Time: 1610-9604 PT Time Calculation (min) (ACUTE ONLY): 18 min   Charges:   PT Evaluation $PT Eval Moderate Complexity: 1 Mod          Reuel Derby, PT, DPT  Acute Rehabilitation Services  Office: [phone]   Rudean Hitt 01/18/2022, 2:46 PM

## 2022-01-18 NOTE — ED Notes (Signed)
Breakfast tray offered states she didn't want anything.

## 2022-01-18 NOTE — ED Notes (Signed)
MD at bedside pt was given supplies to wash mouth out per request of the pt.

## 2022-01-18 NOTE — ED Notes (Signed)
RN notified that my charge nurse has placed transport for pt transport to the floor.  Rn notified that pt is on hospital bed already

## 2022-01-18 NOTE — Evaluation (Signed)
Occupational Therapy Evaluation Patient Details Name: Gloria Gallagher MRN: [id number] DOB: 1950/10/27 Today's Date: 01/18/2022   History of Present Illness Pt is a 71 y/o female admitted secondary to AMS and falls. Recent fall down the steps and found to have type 2 dens fx. PMH includes a fib and ESRD on HD.   Clinical Impression   Patient admitted for the diagnosis above.  PTA she lives at home with her sister, who works during the day, but has a friend that can stay with her during that time frame.  At baseline she is independent with mobility, and was performing her own ADL.  Her sister assists with iADL, meals, and home management.  Currently she is needing up to [facility] for mobility and ADL completion from a sit to stand level. OT will follow in the acute setting with initial 24 hour supervision at home recommended.  No post acute OT is anticipated.         Recommendations for follow up therapy are one component of a multi-disciplinary discharge planning process, led by the attending physician.  Recommendations may be updated based on patient status, additional functional criteria and insurance authorization.   Follow Up Recommendations  No OT follow up    Assistance Recommended at Discharge Intermittent Supervision/Assistance  Patient can return home with the following A little help with bathing/dressing/bathroom;Assist for transportation;Assistance with cooking/housework    Functional Status Assessment  Patient has had a recent decline in their functional status and demonstrates the ability to make significant improvements in function in a reasonable and predictable amount of time.  Equipment Recommendations  Tub/shower seat    Recommendations for Other Services       Precautions / Restrictions Precautions Precautions: Cervical;Fall Precaution Booklet Issued: No Required Braces or Orthoses: Cervical Brace Cervical Brace: At all times;Hard collar Restrictions Weight  Bearing Restrictions: No      Mobility Bed Mobility Overal bed mobility: Needs Assistance Bed Mobility: Sidelying to Sit   Sidelying to sit: Min assist            Transfers Overall transfer level: Needs assistance Equipment used: 1 person hand held assist Transfers: Sit to/from Stand Sit to Stand: Min guard                  Balance Overall balance assessment: Needs assistance Sitting-balance support: Feet supported Sitting balance-Leahy Scale: Good     Standing balance support: Single extremity supported Standing balance-Leahy Scale: Fair                             ADL either performed or assessed with clinical judgement   ADL       Grooming: Wash/dry hands;Wash/dry face;Supervision/safety;Standing               Lower Body Dressing: Min guard;Sit to/from stand   Toilet Transfer: Nature conservation officer;Ambulation                   Vision Patient Visual Report: No change from baseline       Perception Perception Perception: Within Functional Limits   Praxis Praxis Praxis: Intact    Pertinent Vitals/Pain Pain Assessment Pain Assessment: Faces Faces Pain Scale: Hurts little more Pain Location: neck Pain Descriptors / Indicators: Tender Pain Intervention(s): Monitored during session     Hand Dominance Right   Extremity/Trunk Assessment Upper Extremity Assessment Upper Extremity Assessment: Overall WFL for tasks assessed   Lower Extremity Assessment  Lower Extremity Assessment: Defer to PT evaluation   Cervical / Trunk Assessment Cervical / Trunk Assessment: Other exceptions Cervical / Trunk Exceptions: cervical fx   Communication Communication Communication: No difficulties   Cognition Arousal/Alertness: Awake/alert Behavior During Therapy: WFL for tasks assessed/performed Overall Cognitive Status: Impaired/Different from baseline Area of Impairment: Safety/judgement                          Safety/Judgement: Decreased awareness of safety     General Comments: Sister endorses some memlry deficts and processing deficts.     General Comments  Pt's sister present    Exercises     Shoulder Instructions      Home Living Family/patient expects to be discharged to:: Private residence Living Arrangements: Other relatives Available Help at Discharge: Family;Available PRN/intermittently Type of Home: House Home Access: Stairs to enter [employer] of Steps: 1 Entrance Stairs-Rails: None Home Layout: Two level Alternate Level Stairs-Number of Steps: Sister moving her bedroom downstairs Alternate Level Stairs-Rails: Right Bathroom Shower/Tub: Teacher, early years/pre: Standard Bathroom Accessibility: Yes How Accessible: Accessible via walker Home Equipment: None   Additional Comments: Plan is for pt to stay downstairs      Prior Functioning/Environment Prior Level of Function : Independent/Modified Independent                        OT Problem List: Impaired balance (sitting and/or standing);Pain      OT Treatment/Interventions: Self-care/ADL training;Therapeutic activities;Patient/family education;Balance training;DME and/or AE instruction    OT Goals(Current goals can be found in the care plan section) Acute Rehab OT Goals Patient Stated Goal: Return home OT Goal Formulation: With patient Time For Goal Achievement: 02/01/22 Potential to Achieve Goals: Good ADL Goals Pt Will Perform Grooming: with modified independence;standing Pt Will Perform Lower Body Dressing: with modified independence;sit to/from stand Pt Will Transfer to Toilet: with modified independence;ambulating;regular height toilet  OT Frequency: Min 2X/week    Co-evaluation PT/OT/SLP Co-Evaluation/Treatment: Yes Reason for Co-Treatment: Complexity of the patient's impairments (multi-system involvement)   OT goals addressed during session: ADL's and self-care       AM-PAC OT "6 Clicks" Daily Activity     Outcome Measure Help from another person eating meals?: None Help from another person taking care of personal grooming?: A Little Help from another person toileting, which includes using toliet, bedpan, or urinal?: A Little Help from another person bathing (including washing, rinsing, drying)?: A Little Help from another person to put on and taking off regular upper body clothing?: A Little Help from another person to put on and taking off regular lower body clothing?: A Little 6 Click Score: 19   End of Session Equipment Utilized During Treatment: Gait belt Nurse Communication: Mobility status  Activity Tolerance: Patient tolerated treatment well Patient left: in bed;with call bell/phone within reach;with family/visitor present  OT Visit Diagnosis: Unsteadiness on feet (R26.81)                Time: 1405-1430 OT Time Calculation (min): 25 min Charges:  OT General Charges $OT Visit: 1 Visit OT Evaluation $OT Eval Moderate Complexity: 1 Mod  01/18/2022  RP, OTR/L  Acute Rehabilitation Services  Office:  [phone]   Metta Clines 01/18/2022, 3:10 PM

## 2022-01-18 NOTE — ED Notes (Signed)
Pt given clean socks, gown, washing supplies and tooth paste and brush

## 2022-01-18 NOTE — Progress Notes (Signed)
New Admission Note:   Arrival Method: transfer from 53M on bed  Mental Orientation: alert  Telemetry: Assessment: Completed Skin: intact, dry  IV: right forearm  Pain: 0/10  Tubes: none  Safety Measures: Safety Fall Prevention Plan has been given, discussed and signed Admission: Completed [facility] Orientation: Patient has been orientated to the room, unit and staff.  Family:Sister   Orders have been reviewed and implemented. Will continue to monitor the patient. Call light has been placed within reach and bed alarm has been activated.   Hiram Mciver RN [facility] Renal Phone: [phone]

## 2022-01-18 NOTE — Consult Note (Signed)
Renal Service Consult Note [facility]  MATISON NUCCIO 01/18/2022 Sol Blazing, MD Requesting Physician: Dr. Nori Riis  Reason for Consult: ESRD pt w/ AMS HPI: The patient is a 71 y.o. year-old w/ hx of DJD, atrial fib, ESRD on HD, gout who presented to ED for AMS on 9/6. Pt was recently in ED 9/03 after falling down a flight of stairs and sustained an odontoid type II fracture. NSGY recommended hard collar at all times and a 4 wk f/u visit. Pt was dc'd w/ rx for MSIR and voltaren gel. In ED 9/06 work up [employer] showed no new fractures, K+ 5.6, BUN 71, creat 14. Pt missed her HD appt on Monday. She had been taking the MSIR several times since picking up the Rx.  Pt received HD here yesterday and was returned to ED for reassessment but was still confused so the medical team admitted her to telemetry bed. We are asked to see for ESRD.    Pt seen in ED room, family at bedside. Pt w/o c/o's. Denies any blurry vision or headache, no CP or SOB, no n/v/d.  ROS - denies CP, no joint pain, no HA, no blurry vision, no rash, no dysuria, no difficulty voiding   Past Medical History  Past Medical History:  Diagnosis Date   Aortic atherosclerosis (HCC)    Arthritis    Atrial fibrillation (HCC)    CAD (coronary artery disease)    Dialysis patient ([place])    ESRD (end stage renal disease) ([place])    Gout    Heart murmur    Hyperparathyroidism ([place])    Renal disorder    Seasonal allergies    Past Surgical History  Past Surgical History:  Procedure Laterality Date   CARDIOVERSION N/A 05/16/2021   Procedure: CARDIOVERSION;  Surgeon: Nahser, Wonda Cheng, MD;  Location: [place];  Service: Cardiovascular;  Laterality: N/A;   CARDIOVERSION N/A 08/01/2021   Procedure: CARDIOVERSION;  Surgeon: Sanda Klein, MD;  Location: [facility];  Service: Cardiovascular;  Laterality: N/A;   CARDIOVERSION N/A 12/05/2021   Procedure: CARDIOVERSION;  Surgeon: Berniece Salines, DO;  Location: [facility];   Service: Cardiovascular;  Laterality: N/A;   Family History  Family History  Problem Relation Age of Onset   Diabetes Sister    Stomach cancer Neg Hx    Colon cancer Neg Hx    Social History  reports that she has quit smoking. Her smoking use included cigarettes. She has never been exposed to tobacco smoke. She has never used smokeless tobacco. She reports current alcohol use of about 1.0 standard drink of alcohol per week. She reports that she does not use drugs. Allergies  Allergies  Allergen Reactions   Penicillin G Hives and Itching   Shrimp (Diagnostic) Hives   Home medications Prior to Admission medications   Medication Sig Start Date End Date Taking? Authorizing Provider  atorvastatin (LIPITOR) 20 MG tablet Take 20 mg by mouth daily. 12/05/21  Yes [provider]  AURYXIA 1 GM 210 MG(Fe) tablet Take 420-630 mg by mouth 3 (three) times daily after meals. 11/13/21  Yes [provider]  Doxercalciferol (HECTOROL IV) Inject 1 Dose into the vein every Monday, Wednesday, and Friday with hemodialysis. During dialysis, 3X week 11/08/21 11/07/22 Yes [provider]  ELIQUIS 5 MG TABS tablet TAKE 1 TABLET BY MOUTH TWICE  DAILY 12/14/21  Yes Fenton, Clint R, PA  multivitamin (RENA-VIT) TABS tablet Take 1 tablet by mouth daily. 07/19/21  Yes [provider]  amiodarone (PACERONE) 200 MG tablet Take 1 tablet (200 mg total) by mouth daily. 10/19/21   Fenton, Clint R, PA  diclofenac Sodium (VOLTAREN) 1 % GEL Apply 4 g topically 4 (four) times daily. Patient not taking: Reported on 01/18/2022 01/14/22   Deno Etienne, DO  morphine (MSIR) 15 MG tablet Take 0.5 tablets (7.5 mg total) by mouth every 4 (four) hours as needed for severe pain. Patient not taking: Reported on 01/18/2022 01/14/22   Deno Etienne, DO     Vitals:   01/18/22 1200 01/18/22 1230 01/18/22 1300 01/18/22 1330  BP: (!) 155/66 (!) 169/67 (!) 155/67 (!) 158/64  Pulse: 67 65 63 65  Resp: 19 18 16 19   Temp:       TempSrc:      SpO2: 90% 94% 94% 94%  Weight:      Height:       Exam Gen alert, no distress No rash, cyanosis or gangrene Sclera anicteric, throat clear  No jvd or bruits Chest clear bilat to bases, no rales/ wheezing RRR no MRG Abd soft ntnd no mass or ascites +bs GU defer MS no joint effusions or deformity Ext no LE or UE edema, no wounds or ulcers Neuro is awake and nonfocal    LFA AVF+bruit    Home meds include - apixaban, lipitor, auryxia, renavite, amiodarone, MSIR prn, prns/ vits/ supps   OP HD: MWF [facility] 4h  400/1.5   72kg  2/2 bath  Hep 3000  LFA AVF - mircera 30 q 4 - hectorol 11 mcg tiw IV w/ hd - last HD 9/1 , came off at 71.8kg  Assessment/ Plan: AMS - morphine metabolites (M6G specifically) are known to build up in esrd pts and are a well known cause of AMS in this population. Morphine should not be used in ESRD pts. Safer narcotics are fentanyl and hydromorphone. Doubt uremia, creat at baseline for this patient (6- 10) today and pt still confused. Should improve soon.  ESRD - on HD MWF. Had HD here yesterday. Plan next HD tomorrow.  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 CCa in range. Cont IV vdra w hd.  HTN/ vol - BPs are good, min vol excess, up 1-2kg, UFG same w/ next hd Atrial fib - takes eliquis and amiodarone at home   Rob Jonnie Finner  MD 01/18/2022, 1:49 PM Recent Labs  Lab 01/17/22 0942 01/17/22 1020 01/17/22 1753 01/18/22 0226  HGB 10.9*   < > 11.7* 10.5*  ALBUMIN 3.6  --   --   --   CALCIUM 10.1  --  9.7 9.5  PHOS 5.8*  --  3.5  --   CREATININE 14.23*   < > 8.07* 9.69*  K 5.6*   < > 4.5 4.3   < > = values in this interval not displayed.

## 2022-01-18 NOTE — Progress Notes (Signed)
     Daily Progress Note Intern Pager: [phone]  Patient name: Gloria Gallagher Medical record number: [id number] Date of birth: April 21, 1951 Age: 71 y.o. Gender: female  Primary Care Provider: Cipriano Mile, NP Consultants: Nephrology Code Status: Full code  Pt Overview and Major Events to Date:  9/6: Admitted to FMTS  Assessment and Plan: 71 yo female with PMHx ESRD and A-fib presents for AMS in the setting of recent falls.  * AMS (altered mental status) Markedly improved alertness and patient oriented to person, place and situation. AMS likely d/t concussion vs. medication effect. -Labs BMP,CBC, TSH, A1c: unremarkable  -PT/OT following  ESRD (end stage renal disease) on dialysis [facility]) Received dialysis yesterday. Nephrology following. -BMP stable, repeat in AM -Mild anemia, likely d/t anemia of chronic disease   Persistent atrial fibrillation (HCC) Persistent A fib s/p 3 cardioversions this year. -Restart home Eliquis, CBC in AM -Continue home med: Amiodarone 200mg  daily  Odontoid fracture (HCC) S/p fall 3 days ago and admission CT spine with minimally increased anterior displacement. -continue C collar x 4 weeks and follow up neurosurgery outpatient -minimize opioids for pain management  -PRN Tylenol 1000mg  q 6h  -consider reconsulting NSGY for recs   PAD: restart home Lipitor 20mg   FEN/GI: PO intake PPx: None Dispo: Pending improvement of AMS  Subjective:  Patient assess at bedside, sitting up comfortably with C-collar in place. She is alert to person and place but states the year is 2020. Patient able to meaningfully engage in conversation and could state what happened and how she got to the hospital. Patient able to discuss medical history and recognize why she was in a C-collar. Patient denies any acute concerns or pain. States she places to go home with support from her daughter when she leaves here.  Objective: Temp:  [97.7 F (36.5 C)-98.6 F (37 C)]  97.8 F (36.6 C) (09/07 1044) Pulse Rate:  [60-74] 65 (09/07 1130) Resp:  [12-27] 19 (09/07 1130) BP: (115-197)/(42-98) 175/68 (09/07 1130) SpO2:  [90 %-100 %] 93 % (09/07 1130) Weight:  [73 kg-75.3 kg] 73 kg (09/06 1615) Physical Exam: General:  Cardiovascular: RRR. Thrill noted from AV fistula in L UE Respiratory: CTAB. Normal work of breathing Abdomen: Soft, non-tender Extremities: No peripheral edema Neuro: PERRLA. Blank stare with some difficulty following eye movement commands. Remaining CN intact. Motor and sensation intact globally.  Laboratory: Most recent CBC Lab Results  Component Value Date   WBC 4.1 01/18/2022   HGB 10.5 (L) 01/18/2022   HCT 32.1 (L) 01/18/2022   MCV 94.7 01/18/2022   PLT 150 01/18/2022   Most recent BMP    Latest Ref Rng & Units 01/18/2022    2:26 AM  BMP  Glucose 70 - 99 mg/dL 82   BUN 8 - 23 mg/dL 40   Creatinine 0.44 - 1.00 mg/dL 9.69   Sodium 135 - 145 mmol/L 134   Potassium 3.5 - 5.1 mmol/L 4.3   Chloride 98 - 111 mmol/L 95   CO2 22 - 32 mmol/L 25   Calcium 8.9 - 10.3 mg/dL 9.5     Other pertinent labs: TSH, A1c unremarkable   Colletta Maryland, MD 01/18/2022, 12:30 PM  PGY-1, [place] Intern pager: [phone], text pages welcome Secure chat group [place]

## 2022-01-18 NOTE — ED Notes (Signed)
Call transport for pending transport and ETA

## 2022-01-19 DIAGNOSIS — N186 End stage renal disease: Secondary | ICD-10-CM | POA: Diagnosis not present

## 2022-01-19 DIAGNOSIS — S12100D Unspecified displaced fracture of second cervical vertebra, subsequent encounter for fracture with routine healing: Secondary | ICD-10-CM | POA: Diagnosis not present

## 2022-01-19 DIAGNOSIS — I4819 Other persistent atrial fibrillation: Secondary | ICD-10-CM | POA: Diagnosis not present

## 2022-01-19 DIAGNOSIS — R4182 Altered mental status, unspecified: Secondary | ICD-10-CM | POA: Diagnosis not present

## 2022-01-19 LAB — RENAL FUNCTION PANEL
Albumin: 3.2 g/dL — ABNORMAL LOW (ref 3.5–5.0)
Anion gap: 16 — ABNORMAL HIGH (ref 5–15)
BUN: 50 mg/dL — ABNORMAL HIGH (ref 8–23)
CO2: 27 mmol/L (ref 22–32)
Calcium: 10 mg/dL (ref 8.9–10.3)
Chloride: 91 mmol/L — ABNORMAL LOW (ref 98–111)
Creatinine, Ser: 12.11 mg/dL — ABNORMAL HIGH (ref 0.44–1.00)
GFR, Estimated: 3 mL/min — ABNORMAL LOW (ref >60)
Glucose, Bld: 88 mg/dL (ref 70–99)
Phosphorus: 4.9 mg/dL — ABNORMAL HIGH (ref 2.5–4.6)
Potassium: 4.5 mmol/L (ref 3.5–5.1)
Sodium: 134 mmol/L — ABNORMAL LOW (ref 135–145)

## 2022-01-19 LAB — CBC
HCT: 31.9 % — ABNORMAL LOW (ref 36.0–46.0)
Hemoglobin: 10.2 g/dL — ABNORMAL LOW (ref 12.0–15.0)
MCH: 30.4 pg (ref 26.0–34.0)
MCHC: 32 g/dL (ref 30.0–36.0)
MCV: 94.9 fL (ref 80.0–100.0)
Platelets: 161 10*3/uL (ref 150–400)
RBC: 3.36 MIL/uL — ABNORMAL LOW (ref 3.87–5.11)
RDW: 14.8 % (ref 11.5–15.5)
WBC: 3.4 10*3/uL — ABNORMAL LOW (ref 4.0–10.5)
nRBC: 0 % (ref 0.0–0.2)

## 2022-01-19 LAB — MRSA NEXT GEN BY PCR, NASAL: MRSA by PCR Next Gen: NOT DETECTED

## 2022-01-19 MED ORDER — HEPARIN SODIUM (PORCINE) 1000 UNIT/ML DIALYSIS
3000.0000 [IU] | INTRAMUSCULAR | Status: AC | PRN
  Administered 2022-01-19: 3000 [IU] via INTRAVENOUS_CENTRAL
  Filled 2022-01-19: qty 3

## 2022-01-19 MED ORDER — ACETAMINOPHEN 500 MG PO TABS: 1000.0000 mg | ORAL_TABLET | Freq: Four times a day (QID) | ORAL | 0 refills | Status: DC | PRN

## 2022-01-19 MED ORDER — FERRIC CITRATE 1 GM 210 MG(FE) PO TABS: 210.0000 mg | ORAL_TABLET | Freq: Three times a day (TID) | ORAL | 1 refills | Status: AC

## 2022-01-19 NOTE — Progress Notes (Signed)
D/C order noted. Contacted [place] and spoke to [place]. Clinic advised pt will d/c today and resume care on Monday.   Melven Sartorius Renal Navigator [phone]

## 2022-01-19 NOTE — Progress Notes (Signed)
     Daily Progress Note Intern Pager: [phone]  Patient name: Gloria Gallagher Medical record number: [id number] Date of birth: 1950/06/28 Age: 71 y.o. Gender: female  Primary Care Provider: Cipriano Mile, NP Consultants: None Code Status: Full code  Pt Overview and Major Events to Date:  9/6: Admitted to FMTS  Assessment and Plan: 71 yo female with PMHx ESRD and A-fib presents for AMS in the setting of recent falls. * AMS (altered mental status) Patient at baseline, AOx4. AMS likely d/t concussion vs. medication effect. -D/c with home health services -PT/OT following  ESRD (end stage renal disease) on dialysis [facility]) HD scheduled for today. Nephrology following. -Labs stable -Mild anemia, likely d/t anemia of chronic disease   Persistent atrial fibrillation (HCC) Persistent A fib s/p 3 cardioversions this year. -On home Eliquis, CBC stable -Continue home med: Amiodarone 200mg  daily  Odontoid fracture (HCC) Pain well controlled without medication management. -continue C collar x 4 weeks  -Follow up neurosurgery outpatient -Avoid morphine per nephrology, utilize fentanyl if needs opioid management -PRN Tylenol 1000mg  q 6h     FEN/GI: Renal diet PPx: Eliquis Dispo: Home pending family support  Subjective:  Patient assess while receiving dialysis. States she feels well and alert and oriented x 4. States she would like to go home today but feels she would need support to get to the bathroom and dress in the short term. Would like home health. States she sometimes has pain in the neck but feels it is manageable without medication. Denies any acute concerns.  Spoke to patient's daughter Joelene Millin who confirmed family can be available to assist with the patient's needs while home health is getting established.   Objective: Temp:  [97.8 F (36.6 C)-98.6 F (37 C)] 97.8 F (36.6 C) (09/08 0721) Pulse Rate:  [55-67] 62 (09/08 0829) Resp:  [15-23] 15 (09/08 0829) BP:  (144-184)/(59-79) 157/59 (09/08 0829) SpO2:  [90 %-100 %] 93 % (09/08 0829) Weight:  [70.2 kg] 70.2 kg (09/08 0721) Physical Exam: General: well-appearing, no distress, smiling Cardiovascular: RRR. Thrill noted from AV fistula in L UE Respiratory: CTAB. Normal work of breathing Abdomen: Soft, non-tender Extremities: No peripheral edema Neuro: PERRLA. CN intact. Motor and sensation intact globally.  Laboratory: Most recent CBC Lab Results  Component Value Date   WBC 3.4 (L) 01/19/2022   HGB 10.2 (L) 01/19/2022   HCT 31.9 (L) 01/19/2022   MCV 94.9 01/19/2022   PLT 161 01/19/2022   Most recent BMP    Latest Ref Rng & Units 01/19/2022    7:37 AM  BMP  Glucose 70 - 99 mg/dL 88   BUN 8 - 23 mg/dL 50   Creatinine 0.44 - 1.00 mg/dL 12.11   Sodium 135 - 145 mmol/L 134   Potassium 3.5 - 5.1 mmol/L 4.5   Chloride 98 - 111 mmol/L 91   CO2 22 - 32 mmol/L 27   Calcium 8.9 - 10.3 mg/dL 10.0      Colletta Maryland, MD 01/19/2022, 8:57 AM  PGY-1, [place] Intern pager: [phone], text pages welcome Secure chat group [place]

## 2022-01-19 NOTE — Progress Notes (Signed)
[facility] Progress Note   Subjective:    Seen patient on HD. Patient with no acute concerns. Mentation at her baseline. She denies CP, SOB, and N/V. Tolerating UFG 1.5L.   Objective Vitals:   01/19/22 0829 01/19/22 0900 01/19/22 0930 01/19/22 1000  BP: (!) 157/59 (!) 140/63 (!) 147/59 (!) 159/56  Pulse: 62 63 63 63  Resp: 15 14 15 18   Temp:      TempSrc:      SpO2: 93% 97% 93% 95%  Weight:      Height:       Physical Exam General: Older female; A&O x 3; C-collar in place; NAD Heart: S1 and S2; RRR; No murmurs, gallops, or rubs Lungs: CTA anteriorly Abdomen: Soft and non-tender Extremities: No LE edema Dialysis Access: L AVF (+) B/T   Filed Weights   01/17/22 1302 01/17/22 1615 01/19/22 0721  Weight: 75.3 kg 73 kg 70.2 kg    Intake/Output Summary (Last 24 hours) at 01/19/2022 1010 Last data filed at 01/19/2022 0600 Gross per 24 hour  Intake 440 ml  Output 0 ml  Net 440 ml    Additional Objective Labs: Basic Metabolic Panel: Recent Labs  Lab 01/17/22 0942 01/17/22 1020 01/17/22 1753 01/18/22 0226 01/19/22 0737  NA 135   < > 135 134* 134*  K 5.6*   < > 4.5 4.3 4.5  CL 89*   < > 93* 95* 91*  CO2 29  --  26 25 27   GLUCOSE 98   < > 77 82 88  BUN 71*   < > 34* 40* 50*  CREATININE 14.23*   < > 8.07* 9.69* 12.11*  CALCIUM 10.1  --  9.7 9.5 10.0  PHOS 5.8*  --  3.5  --  4.9*   < > = values in this interval not displayed.   Liver Function Tests: Recent Labs  Lab 01/17/22 0942 01/19/22 0737  AST 24  --   ALT 19  --   ALKPHOS 106  --   BILITOT 0.6  --   PROT 6.7  --   ALBUMIN 3.6 3.2*   No results for input(s): "LIPASE", "AMYLASE" in the last 168 hours. CBC: Recent Labs  Lab 01/17/22 0942 01/17/22 1020 01/17/22 1753 01/18/22 0226 01/19/22 0737  WBC 5.3  --  3.9* 4.1 3.4*  NEUTROABS 4.1  --  2.6  --   --   HGB 10.9*   < > 11.7* 10.5* 10.2*  HCT 33.5*   < > 35.5* 32.1* 31.9*  MCV 94.9  --  94.7 94.7 94.9  PLT 179  --  162 150 161    < > = values in this interval not displayed.   Blood Culture No results found for: "SDES", "SPECREQUEST", "CULT", "REPTSTATUS"  Cardiac Enzymes: No results for input(s): "CKTOTAL", "CKMB", "CKMBINDEX", "TROPONINI" in the last 168 hours. CBG: Recent Labs  Lab 01/17/22 1832 01/17/22 1858 01/17/22 1947 01/17/22 2326 01/18/22 0225  GLUCAP 47* 109* 99 81 89   Iron Studies: No results for input(s): "IRON", "TIBC", "TRANSFERRIN", "FERRITIN" in the last 72 hours. No results found for: "INR", "PROTIME" Studies/Results: CT Head Wo Contrast  Result Date: 01/17/2022 CLINICAL DATA:  Fall 3 days ago and noncompliance with neck brace. EXAM: CT HEAD WITHOUT CONTRAST CT CERVICAL SPINE WITHOUT CONTRAST TECHNIQUE: Multidetector CT imaging of the head and cervical spine was performed following the standard protocol without intravenous contrast. Multiplanar CT image reconstructions of the cervical spine were also generated. RADIATION DOSE REDUCTION:  This exam was performed according to the departmental dose-optimization program which includes automated exposure control, adjustment of the mA and/or kV according to patient size and/or use of iterative reconstruction technique. COMPARISON:  CT head and cervical spine 01/14/2022 FINDINGS: CT HEAD FINDINGS Brain: There is no acute intracranial hemorrhage, extra-axial fluid collection, or acute infarct. Parenchymal volume is within normal limits. The ventricles are stable in size. Patchy and confluent hypodensity throughout the supratentorial white matter likely reflecting chronic small vessel ischemic change is unchanged. There is no mass lesion.  There is no mass effect or midline shift. Vascular: There is calcification of the bilateral carotid siphons and vertebral arteries. Skull: Normal. Negative for fracture or focal lesion. Sinuses/Orbits: The paranasal sinuses are clear. The globes and orbits are unremarkable. Other: There is a small right mastoid effusion. The  vertex scalp hematoma is similar in size. Additional soft tissue density nodularity throughout the scalp bilaterally near the vertex likely also reflect hematomas. CT CERVICAL SPINE FINDINGS Alignment: Normal. Skull base and vertebrae: Again seen is a type 2 fracture of the base of the dens now with 4 mm anterior displacement, minimally increased from 3 mm on the prior study. Atlantooccipital and atlantoaxial alignment is maintained. There is a nondisplaced fracture through the left C4 superior articulating process (9-36, unchanged. There is no widening of the adjacent facet joints. Vertebral body heights are preserved. There is no other evidence of acute fracture. There is no suspicious osseous lesion. Soft tissues and spinal canal: No prevertebral fluid or swelling. No visible canal hematoma. Disc levels: The disc heights are overall preserved preserved there is no significant spinal canal stenosis. Upper chest: Emphysema is again seen in the lung apices. Other: None. IMPRESSION: 1. Stable noncontrast head CT with no acute intracranial pathology. Unchanged scalp hematomas. 2. Type 2 dens fracture again seen with 4 mm anterior displacement of the odontoid relative to the base, minimally increased from 3 mm on the prior study. 3. Unchanged nondisplaced fracture through the left C4 superior articulating process. 4. No new acute fracture or traumatic malalignment of the cervical spine. Electronically Signed   By: Valetta Mole M.D.   On: 01/17/2022 11:05   CT Cervical Spine Wo Contrast  Result Date: 01/17/2022 CLINICAL DATA:  Fall 3 days ago and noncompliance with neck brace. EXAM: CT HEAD WITHOUT CONTRAST CT CERVICAL SPINE WITHOUT CONTRAST TECHNIQUE: Multidetector CT imaging of the head and cervical spine was performed following the standard protocol without intravenous contrast. Multiplanar CT image reconstructions of the cervical spine were also generated. RADIATION DOSE REDUCTION: This exam was performed  according to the departmental dose-optimization program which includes automated exposure control, adjustment of the mA and/or kV according to patient size and/or use of iterative reconstruction technique. COMPARISON:  CT head and cervical spine 01/14/2022 FINDINGS: CT HEAD FINDINGS Brain: There is no acute intracranial hemorrhage, extra-axial fluid collection, or acute infarct. Parenchymal volume is within normal limits. The ventricles are stable in size. Patchy and confluent hypodensity throughout the supratentorial white matter likely reflecting chronic small vessel ischemic change is unchanged. There is no mass lesion.  There is no mass effect or midline shift. Vascular: There is calcification of the bilateral carotid siphons and vertebral arteries. Skull: Normal. Negative for fracture or focal lesion. Sinuses/Orbits: The paranasal sinuses are clear. The globes and orbits are unremarkable. Other: There is a small right mastoid effusion. The vertex scalp hematoma is similar in size. Additional soft tissue density nodularity throughout the scalp bilaterally near the vertex likely  also reflect hematomas. CT CERVICAL SPINE FINDINGS Alignment: Normal. Skull base and vertebrae: Again seen is a type 2 fracture of the base of the dens now with 4 mm anterior displacement, minimally increased from 3 mm on the prior study. Atlantooccipital and atlantoaxial alignment is maintained. There is a nondisplaced fracture through the left C4 superior articulating process (9-36, unchanged. There is no widening of the adjacent facet joints. Vertebral body heights are preserved. There is no other evidence of acute fracture. There is no suspicious osseous lesion. Soft tissues and spinal canal: No prevertebral fluid or swelling. No visible canal hematoma. Disc levels: The disc heights are overall preserved preserved there is no significant spinal canal stenosis. Upper chest: Emphysema is again seen in the lung apices. Other: None.  IMPRESSION: 1. Stable noncontrast head CT with no acute intracranial pathology. Unchanged scalp hematomas. 2. Type 2 dens fracture again seen with 4 mm anterior displacement of the odontoid relative to the base, minimally increased from 3 mm on the prior study. 3. Unchanged nondisplaced fracture through the left C4 superior articulating process. 4. No new acute fracture or traumatic malalignment of the cervical spine. Electronically Signed   By: Valetta Mole M.D.   On: 01/17/2022 11:05    Medications:   amiodarone  200 mg Oral Daily   apixaban  5 mg Oral BID   atorvastatin  20 mg Oral Daily   Chlorhexidine Gluconate Cloth  6 each Topical Q0600   Chlorhexidine Gluconate Cloth  6 each Topical Q0600   doxercalciferol  11 mcg Intravenous Q M,W,F-HD   ferric citrate  210 mg Oral TID WC   multivitamin with minerals  1 tablet Oral Daily    Dialysis Orders: MWF [facility] 4h  400/1.5   72kg  2/2 bath  Hep 3000  LFA AVF - mircera 30 q 4 - hectorol 11 mcg tiw IV w/ hd - last HD 9/1 , came off at 71.8kg  Assessment/Plan: AMS - morphine metabolites (M6G specifically) are known to build up in esrd pts and are a well known cause of AMS in this population. Morphine should not be used in ESRD pts. Safer narcotics are fentanyl and hydromorphone. Doubt uremia, creat at baseline for this patient (6- 10) today and pt still confused. Mentation now back to her baseline ESRD - on HD MWF. On HD per usual schedule.  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 CCa in range. Cont IV vdra w hd.  HTN/ vol - BPs are good, min vol excess, up 1-2kg, tolerating UFG 1.5L. Atrial fib - takes eliquis and amiodarone at home Dispo: Patient's mentation is back to her baseline. Okay for discharge from renal standpoint once home health services have been established. Per last family medicine note-f/u with neurosurgery outpatient.  Tobie Poet, NP [facility] 01/19/2022,10:10 AM  LOS: 0 days

## 2022-01-19 NOTE — Progress Notes (Signed)
OT Cancellation Note  Patient Details Name: Gloria Gallagher MRN: [id number] DOB: Feb 06, 1951   Cancelled Treatment:    Reason Eval/Treat Not Completed: Patient at procedure or test/ unavailable.  Pt. Currently at HD.  Will check back as able.   Clearnce Sorrel [place]-COTA/L 01/19/2022, 7:57 AM

## 2022-01-19 NOTE — Hospital Course (Addendum)
71 yo female with PMHx ESRD and A-fib presents for AMS since her recent fall and cervical fracture three days ago and subsequently missing two dialysis sessions while taking morphine for pain control. Alert and oriented only to name on admission. At baseline able to perform iADLs. Persistent confusion after receiving dialysis in the ED. Her hospital course is outlined below:   * AMS (altered mental status) AMS likely d/t concussion vs. medication effect of morphine administration with missed dialysis sessions. On admission, hyperkalemic and only oriented to self. Labs BMP,CBC, stable. TSH and A1c unremarkable. Nephrology was consulted and noted that morphine metabolites are known to build up in ESRD patients and should not be used, concern that this could have contributed to her altered mental status as well as falls. Safer narcotics are fentanyl and hydromorphone. Her mental status improved during the admission. At time of discharge, patient was back at baseline with mental status. Discharged with home health for rehab and family assuming care in the interim.   ESRD (end stage renal disease) on dialysis [facility]) Received dialysis during hospitalization. Nephrology following and lowered auryxia to 1 TID, recommendations as above. Mild anemia, likely d/t anemia of chronic disease   Persistent atrial fibrillation (HCC) Persistent A fib s/p 3 cardioversions this year. Continued home Eliquis and Amiodarone.   Odontoid fracture (HCC) S/p fall 3 days ago and admission CT spine with minimally increased anterior displacement. Patient in C-collar and pain well controlled without medication throughout admission. She was recommended to follow-up with NSGY outpatient and to minimize opioids for pain management.  Issues for follow-up:  Follow-up with neurosurgery on Thursday Sept 21st at 10:30AM for odontoid fracture management If requiring further pain management, avoid morphine. Preference for fentanyl and  hydromorphone in ESRD patients per nephrology.

## 2022-01-19 NOTE — Progress Notes (Signed)
DISCHARGE NOTE HOME CHRISTAN CICCARELLI to be discharged Home per MD order. Discussed prescriptions and follow up appointments with the patient. Prescriptions given to patient; medication list explained in detail. Patient verbalized understanding.  Skin clean, dry and intact without evidence of skin break down, no evidence of skin tears noted. IV catheter discontinued intact. Site without signs and symptoms of complications. Dressing and pressure applied. Pt denies pain at the site currently. No complaints noted.  Patient free of lines, drains, and wounds.   An After Visit Summary (AVS) was printed and given to the patient. Patient escorted via wheelchair, and discharged home via private auto.  Lional Icenogle S Sidnee Gambrill, RN

## 2022-01-19 NOTE — Discharge Instructions (Addendum)
Dear [name],   Thank you so much for allowing [place] to be part of your care!  You were admitted to [facility] for altered mental status/confusion and missed dialysis sessions. You were dialyzed while in the hospital and had improvement in your confusion. Some of your medications were adjusted, we are going to have you continue/restart amiodarone (this is for your Afib). We are going to stop your Morphine, you can take Tylenol for your pain.     [facility] neurosurgery Thursday Sept 21st at 10:30AM [place], [place], [place] [address]    POST-HOSPITAL & CARE INSTRUCTIONS Continue amiodarone for your atrial fibrillation If you are having worsening of your neck pain, please make sure to reach out to Neurosurgery  Please let PCP/Specialists know of any changes that were made.  Please see medications section of this packet for any medication changes.   DOCTOR'S APPOINTMENT & FOLLOW UP CARE INSTRUCTIONS  Future Appointments  Date Time Provider [place]  01/23/2022  8:30 AM Zehr, Laban Emperor, PA-C [facility]  02/20/2022 11:00 AM Nahser, Wonda Cheng, MD [facility]    RETURN PRECAUTIONS: - Worsening confusion - Fever - Significant pain that is not able to be treated by over-the-counter medications - Further falls  Take care and be well!  [facility]  [place],  [id number] [phone]

## 2022-01-19 NOTE — Progress Notes (Signed)
Pt off the unit to hemodialysis  

## 2022-01-19 NOTE — Discharge Summary (Addendum)
[facility] Discharge Summary  Patient name: Gloria Gallagher  Medical record number: [id number] Date of birth: Feb 16, 1951 Age: 71 y.o. Gender: female Date of Admission: 01/17/2022  Date of Discharge: 01/19/2022 Admitting Physician: Shary Key, DO  Primary Care Provider: Cipriano Mile, NP Consultants: Neurosurgery  Indication for Hospitalization: [place] Hospital Course:  71 yo female with PMHx ESRD and A-fib presents for AMS since her recent fall and cervical fracture three days ago and subsequently missing two dialysis sessions while taking morphine for pain control. Alert and oriented only to name on admission. At baseline able to perform iADLs. Persistent confusion after receiving dialysis in the ED. Her hospital course is outlined below:   AMS (altered mental status) AMS likely d/t concussion vs. medication effect of morphine administration with missed dialysis sessions. On admission, hyperkalemic and only oriented to self. Labs BMP,CBC, stable. TSH and A1c unremarkable. Nephrology was consulted and noted that morphine metabolites are known to build up in ESRD patients and should not be used, concern that this could have contributed to her altered mental status as well as falls. Safer narcotics are fentanyl and hydromorphone if ever needed again for pain control. Her mental status improved during the admission. At time of discharge, patient was back at baseline with mental status. Discharged with home health for rehab and family assuming care in the interim.   ESRD (end stage renal disease) on dialysis [facility]) Received dialysis during hospitalization. Nephrology following and lowered auryxia to 1 TID, recommendations as above. Mild anemia, likely d/t anemia of chronic disease   Persistent atrial fibrillation (HCC) Persistent A fib s/p 3 cardioversions this year. Continued home Eliquis and Amiodarone.   Odontoid fracture (HCC) S/p fall 3 days ago and  admission CT spine with minimally increased anterior displacement. Patient in C-collar and pain well controlled without medication throughout admission. She was recommended to follow-up with NSGY outpatient and to minimize opioids for pain management.  Issues for follow-up:  Follow-up with neurosurgery on Thursday Sept 21st at 10:30AM for odontoid fracture management If requiring further pain management, avoid morphine. Preference for fentanyl and hydromorphone in ESRD patients per nephrology.   Discharge Diagnoses/Problem List:  Principal Problem for Admission: AMS Other Problems addressed during stay:  Odontoid fracture HTN A-fib   Disposition: Home with home health  Discharge Condition: Stable  Discharge Exam:  Vitals:   01/19/22 1100 01/19/22 1130  BP: (!) 146/54 (!) 146/64  Pulse: 64 65  Resp: 14 (!) 28  Temp:    SpO2: 95% 96%    Significant Procedures: None  Significant Labs and Imaging:  Recent Labs  Lab 01/17/22 1753 01/18/22 0226 01/19/22 0737  WBC 3.9* 4.1 3.4*  HGB 11.7* 10.5* 10.2*  HCT 35.5* 32.1* 31.9*  PLT 162 150 161   Recent Labs  Lab 01/17/22 1753 01/18/22 0226 01/19/22 0737  NA 135 134* 134*  K 4.5 4.3 4.5  CL 93* 95* 91*  CO2 26 25 27   GLUCOSE 77 82 88  BUN 34* 40* 50*  CREATININE 8.07* 9.69* 12.11*  CALCIUM 9.7 9.5 10.0  MG 2.0  --   --   PHOS 3.5  --  4.9*  ALBUMIN  --   --  3.2*    CT Head Wo Contrast  Result Date: 01/17/2022 CLINICAL DATA:  Fall 3 days ago and noncompliance with neck brace. EXAM: CT HEAD WITHOUT CONTRAST CT CERVICAL SPINE WITHOUT CONTRAST TECHNIQUE: Multidetector CT imaging of the head and cervical spine  was performed following the standard protocol without intravenous contrast. Multiplanar CT image reconstructions of the cervical spine were also generated. RADIATION DOSE REDUCTION: This exam was performed according to the departmental dose-optimization program which includes automated exposure control, adjustment of  the mA and/or kV according to patient size and/or use of iterative reconstruction technique. COMPARISON:  CT head and cervical spine 01/14/2022 FINDINGS: CT HEAD FINDINGS Brain: There is no acute intracranial hemorrhage, extra-axial fluid collection, or acute infarct. Parenchymal volume is within normal limits. The ventricles are stable in size. Patchy and confluent hypodensity throughout the supratentorial white matter likely reflecting chronic small vessel ischemic change is unchanged. There is no mass lesion.  There is no mass effect or midline shift. Vascular: There is calcification of the bilateral carotid siphons and vertebral arteries. Skull: Normal. Negative for fracture or focal lesion. Sinuses/Orbits: The paranasal sinuses are clear. The globes and orbits are unremarkable. Other: There is a small right mastoid effusion. The vertex scalp hematoma is similar in size. Additional soft tissue density nodularity throughout the scalp bilaterally near the vertex likely also reflect hematomas. CT CERVICAL SPINE FINDINGS Alignment: Normal. Skull base and vertebrae: Again seen is a type 2 fracture of the base of the dens now with 4 mm anterior displacement, minimally increased from 3 mm on the prior study. Atlantooccipital and atlantoaxial alignment is maintained. There is a nondisplaced fracture through the left C4 superior articulating process (9-36, unchanged. There is no widening of the adjacent facet joints. Vertebral body heights are preserved. There is no other evidence of acute fracture. There is no suspicious osseous lesion. Soft tissues and spinal canal: No prevertebral fluid or swelling. No visible canal hematoma. Disc levels: The disc heights are overall preserved preserved there is no significant spinal canal stenosis. Upper chest: Emphysema is again seen in the lung apices. Other: None. IMPRESSION: 1. Stable noncontrast head CT with no acute intracranial pathology. Unchanged scalp hematomas. 2. Type 2  dens fracture again seen with 4 mm anterior displacement of the odontoid relative to the base, minimally increased from 3 mm on the prior study. 3. Unchanged nondisplaced fracture through the left C4 superior articulating process. 4. No new acute fracture or traumatic malalignment of the cervical spine. Electronically Signed   By: Valetta Mole M.D.   On: 01/17/2022 11:05   CT Cervical Spine Wo Contrast  Result Date: 01/17/2022 CLINICAL DATA:  Fall 3 days ago and noncompliance with neck brace. EXAM: CT HEAD WITHOUT CONTRAST CT CERVICAL SPINE WITHOUT CONTRAST TECHNIQUE: Multidetector CT imaging of the head and cervical spine was performed following the standard protocol without intravenous contrast. Multiplanar CT image reconstructions of the cervical spine were also generated. RADIATION DOSE REDUCTION: This exam was performed according to the departmental dose-optimization program which includes automated exposure control, adjustment of the mA and/or kV according to patient size and/or use of iterative reconstruction technique. COMPARISON:  CT head and cervical spine 01/14/2022 FINDINGS: CT HEAD FINDINGS Brain: There is no acute intracranial hemorrhage, extra-axial fluid collection, or acute infarct. Parenchymal volume is within normal limits. The ventricles are stable in size. Patchy and confluent hypodensity throughout the supratentorial white matter likely reflecting chronic small vessel ischemic change is unchanged. There is no mass lesion.  There is no mass effect or midline shift. Vascular: There is calcification of the bilateral carotid siphons and vertebral arteries. Skull: Normal. Negative for fracture or focal lesion. Sinuses/Orbits: The paranasal sinuses are clear. The globes and orbits are unremarkable. Other: There is a small right  mastoid effusion. The vertex scalp hematoma is similar in size. Additional soft tissue density nodularity throughout the scalp bilaterally near the vertex likely also  reflect hematomas. CT CERVICAL SPINE FINDINGS Alignment: Normal. Skull base and vertebrae: Again seen is a type 2 fracture of the base of the dens now with 4 mm anterior displacement, minimally increased from 3 mm on the prior study. Atlantooccipital and atlantoaxial alignment is maintained. There is a nondisplaced fracture through the left C4 superior articulating process (9-36, unchanged. There is no widening of the adjacent facet joints. Vertebral body heights are preserved. There is no other evidence of acute fracture. There is no suspicious osseous lesion. Soft tissues and spinal canal: No prevertebral fluid or swelling. No visible canal hematoma. Disc levels: The disc heights are overall preserved preserved there is no significant spinal canal stenosis. Upper chest: Emphysema is again seen in the lung apices. Other: None. IMPRESSION: 1. Stable noncontrast head CT with no acute intracranial pathology. Unchanged scalp hematomas. 2. Type 2 dens fracture again seen with 4 mm anterior displacement of the odontoid relative to the base, minimally increased from 3 mm on the prior study. 3. Unchanged nondisplaced fracture through the left C4 superior articulating process. 4. No new acute fracture or traumatic malalignment of the cervical spine. Electronically Signed   By: Valetta Mole M.D.   On: 01/17/2022 11:05   CT Head Wo Contrast  Result Date: 01/14/2022 CLINICAL DATA:  Head and neck trauma. EXAM: CT HEAD WITHOUT CONTRAST CT CERVICAL SPINE WITHOUT CONTRAST TECHNIQUE: Multidetector CT imaging of the head and cervical spine was performed following the standard protocol without intravenous contrast. Multiplanar CT image reconstructions of the cervical spine were also generated. RADIATION DOSE REDUCTION: This exam was performed according to the departmental dose-optimization program which includes automated exposure control, adjustment of the mA and/or kV according to patient size and/or use of iterative  reconstruction technique. COMPARISON:  04/18/2021. FINDINGS: CT HEAD FINDINGS Brain: No acute intracranial hemorrhage, midline shift or mass effect. No extra-axial fluid collection. Diffuse atrophy is noted. Periventricular and subcortical white matter hypodensities are present bilaterally. No hydrocephalus. Vascular: No hyperdense vessel or unexpected calcification. Skull: No acute fracture. Sinuses/Orbits: No acute finding. Other: A large scalp hematoma is present over the frontoparietal regions bilaterally. There is partial opacification of the mastoid air cells on the right with sclerosis which is unchanged from the previous exam. CT CERVICAL SPINE FINDINGS Alignment: Normal. Skull base and vertebrae: There is a type 2 fracture at the base of the dens with 3 mm anterior displacement of the odontoid. The atlantoaxial articulation is within normal limits the remaining bony structures are intact Soft tissues and spinal canal: Mild paravertebral soft tissue edema. No visible canal hematoma. Disc levels: Intervertebral disc spaces maintained. No significant spinal canal stenosis. Upper chest: Emphysematous changes are present in the lungs. Other: Carotid artery calcifications. IMPRESSION: 1. No acute intracranial process. 2. Atrophy with chronic microvascular ischemic changes. 3. Large scalp hematoma over the frontal and parietal regions bilaterally. 4. Type 2 dens fracture with 3 mm anterior displacement of the odontoid relative to the base Critical findings were reported to Dr. Deno Etienne at 3:25 a.m. Electronically Signed   By: Brett Fairy M.D.   On: 01/14/2022 03:26   CT Cervical Spine Wo Contrast  Result Date: 01/14/2022 CLINICAL DATA:  Head and neck trauma. EXAM: CT HEAD WITHOUT CONTRAST CT CERVICAL SPINE WITHOUT CONTRAST TECHNIQUE: Multidetector CT imaging of the head and cervical spine was performed following the standard  protocol without intravenous contrast. Multiplanar CT image reconstructions of the  cervical spine were also generated. RADIATION DOSE REDUCTION: This exam was performed according to the departmental dose-optimization program which includes automated exposure control, adjustment of the mA and/or kV according to patient size and/or use of iterative reconstruction technique. COMPARISON:  04/18/2021. FINDINGS: CT HEAD FINDINGS Brain: No acute intracranial hemorrhage, midline shift or mass effect. No extra-axial fluid collection. Diffuse atrophy is noted. Periventricular and subcortical white matter hypodensities are present bilaterally. No hydrocephalus. Vascular: No hyperdense vessel or unexpected calcification. Skull: No acute fracture. Sinuses/Orbits: No acute finding. Other: A large scalp hematoma is present over the frontoparietal regions bilaterally. There is partial opacification of the mastoid air cells on the right with sclerosis which is unchanged from the previous exam. CT CERVICAL SPINE FINDINGS Alignment: Normal. Skull base and vertebrae: There is a type 2 fracture at the base of the dens with 3 mm anterior displacement of the odontoid. The atlantoaxial articulation is within normal limits the remaining bony structures are intact Soft tissues and spinal canal: Mild paravertebral soft tissue edema. No visible canal hematoma. Disc levels: Intervertebral disc spaces maintained. No significant spinal canal stenosis. Upper chest: Emphysematous changes are present in the lungs. Other: Carotid artery calcifications. IMPRESSION: 1. No acute intracranial process. 2. Atrophy with chronic microvascular ischemic changes. 3. Large scalp hematoma over the frontal and parietal regions bilaterally. 4. Type 2 dens fracture with 3 mm anterior displacement of the odontoid relative to the base Critical findings were reported to Dr. Deno Etienne at 3:25 a.m. Electronically Signed   By: Brett Fairy M.D.   On: 01/14/2022 03:26   DG Thoracic Spine W/Swimmers  Result Date: 01/14/2022 CLINICAL DATA:  Fall and  back pain. EXAM: THORACIC SPINE - 3 VIEWS COMPARISON:  Chest radiograph dated 01/14/2022. FINDINGS: Evaluation is limited due to osteopenia and degenerative changes. There is fracture of the C2 vertebra, better evaluated on the accompanying cervical spine CT. No obvious acute fracture of the thoracic spine. Multilevel degenerative changes. IMPRESSION: 1. No acute fracture of the thoracic spine. 2. C2 fracture, better evaluated on the accompanying cervical spine CT. Electronically Signed   By: Anner Crete M.D.   On: 01/14/2022 03:14   DG Chest Port 1 View  Result Date: 01/14/2022 CLINICAL DATA:  Fall, trauma. EXAM: PORTABLE CHEST 1 VIEW COMPARISON:  04/18/2021. FINDINGS: Heart is markedly enlarged and the pulmonary vasculature is distended. Interstitial and airspace opacities are noted in the lungs bilaterally. No consolidation, effusion, or pneumothorax. Stable calcifications are noted in the region of the scapula, possible loose bodies. No acute osseous abnormality. IMPRESSION: 1. Cardiomegaly with pulmonary vascular congestion. 2. Interstitial and airspace opacities in the lungs bilaterally, possible edema or infiltrate. Electronically Signed   By: Brett Fairy M.D.   On: 01/14/2022 02:47     Results/Tests Pending at Time of Discharge: None  Discharge Medications:  Allergies as of 01/19/2022       Reactions   Penicillin G Hives, Itching   Shrimp (diagnostic) Hives        Medication List     STOP taking these medications    morphine 15 MG tablet Commonly known as: MSIR       TAKE these medications    acetaminophen 500 MG tablet Commonly known as: TYLENOL Take 2 tablets (1,000 mg total) by mouth every 6 (six) hours as needed for mild pain or moderate pain.   amiodarone 200 MG tablet Commonly known as: PACERONE Take 1 tablet (  200 mg total) by mouth daily.   atorvastatin 20 MG tablet Commonly known as: LIPITOR Take 20 mg by mouth daily.   diclofenac Sodium 1 %  Gel Commonly known as: VOLTAREN Apply 4 g topically 4 (four) times daily.   Eliquis 5 MG Tabs tablet Generic drug: apixaban TAKE 1 TABLET BY MOUTH TWICE  DAILY   ferric citrate 1 GM 210 MG(Fe) tablet Commonly known as: AURYXIA Take 1 tablet (210 mg total) by mouth 3 (three) times daily with meals. What changed:  how much to take when to take this   HECTOROL IV Inject 1 Dose into the vein every Monday, Wednesday, and Friday with hemodialysis. During dialysis, 3X week   multivitamin Tabs tablet Take 1 tablet by mouth daily.        Discharge Instructions: Please refer to Patient Instructions section of EMR for full details.  Patient was counseled important signs and symptoms that should prompt return to medical care, changes in medications, dietary instructions, activity restrictions, and follow up appointments.   Follow-Up Appointments: Neurosurgery on Thursday Sept 21st at 10:30AM   Colletta Maryland, MD 01/19/2022, 11:49 AM PGY-1, [facility]   Upper Level Addendum:  I have seen and evaluated this patient along with Dr. Jerilee Hoh and reviewed the above note, making necessary revisions as appropriate.  I agree with the medical decision making and physical exam as noted above.  Erskine Emery, MD PGY-2 [facility]

## 2022-01-19 NOTE — Progress Notes (Signed)
Received patient in bed to unit.  Alert and oriented.  Informed consent signed and in chart.   Treatment initiated: [place] Treatment completed: 1147  Patient tolerated well.  Transported back to the room  Alert, without acute distress.  Hand-off given to patient's nurse.   Access used: AVF Access issues: NA  Total UF removed: 1520ml Medication(s) given: Heparin 3000 unit bolus, Hectorol 48mcg IVP Post HD VS: 98.2    155/65    65    19    91% RA Post HD weight: 68.5kg   [facility]

## 2022-01-19 NOTE — TOC Transition Note (Signed)
Transition of Care [facility]) - CM/SW Discharge Note   Patient Details  Name: Gloria Gallagher MRN: [id number] Date of Birth: 20-May-1950  Transition of Care [facility]) CM/SW Contact:  Tom-Johnson, Renea Ee, RN Phone Number: 01/19/2022, 1:21 PM   Clinical Narrative:     Patient is scheduled for discharge today. Admitted for AMS after recent Fall at home and sustained Cervical Fracture, Neuro sx following outpatient and has a Cervical collar in place.  Patient has hx of ESRD, states she missed two dialysis sessions due to taking Morphine for pain control. Had inpatient dialysis today. Patient's orientation is back to baseline, alert and oriented x 4.  States she lives with her sister,has a supportive daughter. Uses [facility] to and from her appointments. Does not have DME's at home. Tub bench and RW ordered from [employer], [place] to deliver at bedside.  Home health referral called in to [facility] per patient's request, info on AVS.  PCP is Cipriano Mile, NP and uses [facility] on [address].  Daughter to transport at discharge. No further TOC needs noted.    Final next level of care: [place] Barriers to Discharge: Barriers Resolved   Patient Goals and CMS Choice Patient states their goals for this hospitalization and ongoing recovery are:: To return home CMS Medicare.gov Compare Post Acute Care list provided to:: Patient Choice offered to / list presented to : Patient  Discharge Placement                Patient to be transferred to facility by: Daughter      Discharge Plan and Services                DME Arranged: Bedside commode, Tub bench DME Agency: [employer] Date DME Agency Contacted: 01/19/22 Time DME Agency Contacted: 92 Representative spoke with at DME Agency: Jodell [facility] HH Arranged: PT, OT [place] Agency: [place] Date [place]: 01/19/22 Time [place]: 61 Representative spoke with at [place]: [place]  Determinants of Health ([place]) Interventions     Readmission Risk Interventions     No data to display

## 2022-01-21 NOTE — TOC Transition Note (Signed)
Transition of care contact from inpatient facility  Date of discharge: 01/19/22 Date of contact: 01/21/22 Method: Attempted Phone Call Spoke to: No Answer  Called patient to discuss transition of care from recent inpatient hospitalization but she didn't pick up the phone. Left a voicemail to reach out to [facility] for any questions and/or concerns.   Patient will follow up with her outpatient HD unit on: 01/22/22 at [facility].  Tobie Poet, NP

## 2022-01-23 ENCOUNTER — Ambulatory Visit: Payer: Medicare Other | Admitting: Gastroenterology

## 2022-01-23 DIAGNOSIS — M4842XA Fatigue fracture of vertebra, cervical region, initial encounter for fracture: Secondary | ICD-10-CM | POA: Insufficient documentation

## 2022-02-07 ENCOUNTER — Encounter (HOSPITAL_COMMUNITY): Payer: Self-pay | Admitting: Emergency Medicine

## 2022-02-07 ENCOUNTER — Telehealth: Payer: Self-pay

## 2022-02-07 ENCOUNTER — Emergency Department (HOSPITAL_COMMUNITY)
Admission: EM | Admit: 2022-02-07 | Discharge: 2022-02-07 | Disposition: A | Discharge status: Home / Self Care | Payer: Medicare Other | Attending: Emergency Medicine | Admitting: Emergency Medicine

## 2022-02-07 DIAGNOSIS — Y718 Miscellaneous cardiovascular devices associated with adverse incidents, not elsewhere classified: Secondary | ICD-10-CM | POA: Insufficient documentation

## 2022-02-07 DIAGNOSIS — T8249XA Other complication of vascular dialysis catheter, initial encounter: Secondary | ICD-10-CM | POA: Insufficient documentation

## 2022-02-07 DIAGNOSIS — T829XXA Unspecified complication of cardiac and vascular prosthetic device, implant and graft, initial encounter: Secondary | ICD-10-CM

## 2022-02-07 DIAGNOSIS — Z7901 Long term (current) use of anticoagulants: Secondary | ICD-10-CM | POA: Insufficient documentation

## 2022-02-07 NOTE — Telephone Encounter (Signed)
Crystal with [facility] stating pt is currently there at their facility and they are having trouble getting the scab to stop bleeding and was requesting pt to be evaluated in our office today. I spoke with our in house PA [facility]) She states that if they are unable to stop the bleeding and are suspecting a rupture at the AVF pt needs to proceed to ED. I advise Crystal and she stated she would notify the pt.

## 2022-02-07 NOTE — Discharge Instructions (Addendum)
Return to the ER if your wound looks worse or if you develop bleeding, pain, fever, or any other new/concerning symptoms.

## 2022-02-07 NOTE — ED Triage Notes (Addendum)
Patient BIB [facility] from dialysis center called out by facility staff for evaluation of a wound on her dialysis access.Patient has no complaints now, states it hurt more than normal while needle was inserted during treatment. Patient completed full treatment.

## 2022-02-07 NOTE — ED Provider Notes (Signed)
[facility] EMERGENCY DEPARTMENT Provider Note   CSN: [id number] Arrival date & time: 02/07/22  1223     History  Chief Complaint  Patient presents with   Vascular Access Problem    Gloria Gallagher is a 71 y.o. female.  HPI 71 year old female with a history of ESRD presents with a scab to her left arm fistula.  Its been there for over a week.  She was told to go to the ER by her dialysis center.  She had dialysis today and it went well.  There was a little bit of bleeding afterwards but this resolved.  She otherwise feels fine. No pain in her arm.  Home Medications Prior to Admission medications   Medication Sig Start Date End Date Taking? Authorizing Provider  acetaminophen (TYLENOL) 500 MG tablet Take 2 tablets (1,000 mg total) by mouth every 6 (six) hours as needed for mild pain or moderate pain. 01/19/22   Erskine Emery, MD  amiodarone (PACERONE) 200 MG tablet Take 1 tablet (200 mg total) by mouth daily. 10/19/21   Fenton, Clint R, PA  atorvastatin (LIPITOR) 20 MG tablet Take 20 mg by mouth daily. 12/05/21   [provider]  diclofenac Sodium (VOLTAREN) 1 % GEL Apply 4 g topically 4 (four) times daily. Patient not taking: Reported on 01/18/2022 01/14/22   Deno Etienne, DO  Doxercalciferol (HECTOROL IV) Inject 1 Dose into the vein every Monday, Wednesday, and Friday with hemodialysis. During dialysis, 3X week 11/08/21 11/07/22  [provider]  ELIQUIS 5 MG TABS tablet TAKE 1 TABLET BY MOUTH TWICE  DAILY 12/14/21   Fenton, Clint R, PA  ferric citrate (AURYXIA) 1 GM 210 MG(Fe) tablet Take 1 tablet (210 mg total) by mouth 3 (three) times daily with meals. 01/19/22   Erskine Emery, MD  multivitamin (RENA-VIT) TABS tablet Take 1 tablet by mouth daily. 07/19/21   [provider]      Allergies    Penicillin g and Shrimp (diagnostic)    Review of Systems   Review of Systems  Skin:  Positive for wound.    Physical Exam Updated Vital Signs BP (!)  136/55   Pulse 64   Temp 98.9 F (37.2 C) (Oral)   Resp 18   SpO2 95%  Physical Exam Vitals and nursing note reviewed.  Constitutional:      Appearance: She is well-developed.  HENT:     Head: Normocephalic and atraumatic.  Cardiovascular:     Rate and Rhythm: Normal rate and regular rhythm.  Pulmonary:     Effort: Pulmonary effort is normal.  Abdominal:     General: There is no distension.  Musculoskeletal:     Comments: See photo for left sided dialysis access.  There is a scab there but there is no active bleeding or penetrating/ulcerative wound.  No significant tenderness.  Positive thrill.  Skin:    General: Skin is warm and dry.  Neurological:     Mental Status: She is alert.      ED Results / Procedures / Treatments   Labs (all labs ordered are listed, but only abnormal results are displayed) Labs Reviewed - No data to display  EKG None  Radiology No results found.  Procedures Procedures    Medications Ordered in ED Medications - No data to display  ED Course/ Medical Decision Making/ A&P  Medical Decision Making Amount and/or Complexity of Data Reviewed External Data Reviewed: notes.   Patient's fistula looks pretty good besides this scab and superficial wound.  There is no bleeding.  The bandage that was applied in triage (now many hours ago) is completely dry.  I do not think she is at high risk for bleeding currently.  I offered to discuss with vascular but at this point she is wanting to just leave.  I think this is pretty reasonable.  She states she already has outpatient follow-up with vascular and she was given return precautions.  No indication for lab work.        Final Clinical Impression(s) / ED Diagnoses Final diagnoses:  Complication of vascular access for dialysis, initial encounter    Rx / DC Orders ED Discharge Orders     None         Sherwood Gambler, MD 02/07/22 1950

## 2022-02-07 NOTE — ED Provider Triage Note (Signed)
Emergency Medicine Provider Triage Evaluation Note  Gloria Gallagher , a 71 y.o. female  was evaluated in triage.  Pt complains of vascular access pain.  Patient has an ulceration on the upper part of her fistula.  She was able to complete hemodialysis but started having pain in the upper portion of her fistula and was sent in for further evaluation, no active bleeding.  Review of Systems  Positive: Ulceration Negative: Fever  Physical Exam  BP (!) 149/71   Pulse 62   Temp 99 F (37.2 C) (Oral)   Resp 19   SpO2 91%  Gen:   Awake, no distress   Resp:  Normal effort  MSK:   Moves extremities without difficulty  Other:  Ration on upper and lower portion of the superior aspect of the vascular graft.    Medical Decision Making  Medically screening exam initiated at 12:48 PM.  Appropriate orders placed.  Gloria Gallagher was informed that the remainder of the evaluation will be completed by another provider, this initial triage assessment does not replace that evaluation, and the importance of remaining in the ED until their evaluation is complete.  Work-up initiated   Margarita Mail, PA-C 02/07/22 1250

## 2022-02-12 NOTE — Progress Notes (Signed)
[facility] OF [place]  ASSESSMENT / PLAN: Gloria Gallagher is a 71 y.o. 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  This is concerning to me for potential bleeding event.  We will plan for revision on Friday, 02/16/2022.  I suspect there will be enough fistula to avoid placing a tunneled dialysis catheter.  CHIEF COMPLAINT: Ulceration of fistula  HISTORY OF PRESENT ILLNESS: Gloria Gallagher is a 71 y.o. female referred to clinic for evaluation of possible lower extremity arterial disease.  The patient describes paresthesias and electric discomfort in her right foot.  She does not report pain, per se.  She just describes an odd sensation which is bothersome to her.  She does not report symptoms classic of intermittent claudication.  She can walk without pain in her calves.  She has no ischemic ulcers about her foot  12/19/21: Patient returns to clinic for short interval follow-up.  She is doing well overall.  She describes claudication symptoms which are not disabling.  She has no ischemic rest pain.  She has no ulcers about her feet.  02/13/22: Returns to clinic for evaluation of fistula.  Fistula has developed ulceration in 2 areas, concerning her treating nephrologist.  She was referred for evaluation.  The patient reports no bleeding from this ulceration.  Past Medical History:  Diagnosis Date   Aortic atherosclerosis (HCC)    Arthritis    Atrial fibrillation (HCC)    CAD (coronary artery disease)    Dialysis patient ([place])    ESRD on hemodialysis ([place])    MWF at [facility]   Gout    Heart murmur    Hyperparathyroidism ([place])    Seasonal allergies     Past Surgical History:  Procedure Laterality Date   CARDIOVERSION N/A 05/16/2021   Procedure: CARDIOVERSION;  Surgeon: Acie Fredrickson Wonda Cheng, MD;  Location: [facility] ENDOSCOPY;  Service: Cardiovascular;  Laterality: N/A;   CARDIOVERSION N/A 08/01/2021   Procedure: CARDIOVERSION;  Surgeon: Sanda Klein, MD;  Location: [place] ENDOSCOPY;  Service: Cardiovascular;  Laterality: N/A;   CARDIOVERSION N/A 12/05/2021   Procedure: CARDIOVERSION;  Surgeon: Berniece Salines, DO;  Location: [facility] ENDOSCOPY;  Service: Cardiovascular;  Laterality: N/A;    Family History  Problem Relation Age of Onset   Diabetes Sister    Stomach cancer Neg Hx    Colon cancer Neg Hx     Social History   Socioeconomic History   Marital status: Single    Spouse name: Not on file   Number of children: 1   Years of education: Not on file   Highest education level: Not on file  Occupational History   Occupation: retired  Tobacco Use   Smoking status: Former    Types: Cigarettes    Passive exposure: Never   Smokeless tobacco: Never   Tobacco comments:    Former smoker 04/25/2021  Vaping Use   Vaping Use: Never used  Substance and Sexual Activity   Alcohol use: Yes    Alcohol/week: 1.0 standard drink of alcohol    Types: 1 Cans of beer per week    Comment: 1 beer every other week 06/20/21   Drug use: Never   Sexual activity: Not Currently  Other Topics Concern   Not on file  Social History Narrative   Not on file   Social Determinants of Health   Financial Resource Strain: Not on file  Food Insecurity: No Food Insecurity (01/18/2022)   Hunger Vital Sign    Worried  About Running Out of Food in the Last Year: Never true    Ran Out of Food in the Last Year: Never true  Transportation Needs: No Transportation Needs (01/18/2022)   PRAPARE - Hydrologist (Medical): No    Lack of Transportation (Non-Medical): No  Physical Activity: Not on file  Stress: Not on file  Social Connections: Not on file  Intimate Partner Violence: Not At Risk (01/18/2022)   Humiliation, Afraid, Rape, and Kick questionnaire    Fear of Current or Ex-Partner: No    Emotionally Abused: No    Physically Abused: No    Sexually Abused: No    Allergies  Allergen Reactions   Penicillin G Hives and Itching    Shrimp (Diagnostic) Hives    Current Outpatient Medications  Medication Sig Dispense Refill   acetaminophen (TYLENOL) 500 MG tablet Take 2 tablets (1,000 mg total) by mouth every 6 (six) hours as needed for mild pain or moderate pain. 30 tablet 0   amiodarone (PACERONE) 200 MG tablet Take 1 tablet (200 mg total) by mouth daily. 90 tablet 2   atorvastatin (LIPITOR) 20 MG tablet Take 20 mg by mouth daily.     diclofenac Sodium (VOLTAREN) 1 % GEL Apply 4 g topically 4 (four) times daily. (Patient not taking: Reported on 01/18/2022) 100 g 0   Doxercalciferol (HECTOROL IV) Inject 1 Dose into the vein every Monday, Wednesday, and Friday with hemodialysis. During dialysis, 3X week     ELIQUIS 5 MG TABS tablet TAKE 1 TABLET BY MOUTH TWICE  DAILY 180 tablet 3   ferric citrate (AURYXIA) 1 GM 210 MG(Fe) tablet Take 1 tablet (210 mg total) by mouth 3 (three) times daily with meals. 270 tablet 1   multivitamin (RENA-VIT) TABS tablet Take 1 tablet by mouth daily.     No current facility-administered medications for this visit.    PHYSICAL EXAM There were no vitals filed for this visit.    Constitutional: Chronically ill-appearing woman in no acute distress Cardiac: Regular rate and rhythm.  Respiratory:  unlabored. Abdominal:  soft, non-tender, non-distended.  Peripheral vascular: No palpable pedal pulses.  No ulcers about the heel, foot, toes, interdigital spaces. Left upper extremity arteriovenous fistula with strong thrill.  Highly tortuous.  2 areas of ulceration without active bleeding.  PERTINENT LABORATORY AND RADIOLOGIC DATA  Most recent CBC    Latest Ref Rng & Units 01/19/2022    7:37 AM 01/18/2022    2:26 AM 01/17/2022    5:53 PM  CBC  WBC 4.0 - 10.5 K/uL 3.4  4.1  3.9   Hemoglobin 12.0 - 15.0 g/dL 10.2  10.5  11.7   Hematocrit 36.0 - 46.0 % 31.9  32.1  35.5   Platelets 150 - 400 K/uL 161  150  162      Most recent CMP    Latest Ref Rng & Units 01/19/2022    7:37 AM 01/18/2022    2:26  AM 01/17/2022    5:53 PM  CMP  Glucose 70 - 99 mg/dL 88  82  77   BUN 8 - 23 mg/dL 50  40  34   Creatinine 0.44 - 1.00 mg/dL 12.11  9.69  8.07   Sodium 135 - 145 mmol/L 134  134  135   Potassium 3.5 - 5.1 mmol/L 4.5  4.3  4.5   Chloride 98 - 111 mmol/L 91  95  93   CO2 22 - 32 mmol/L 27  25  26   Calcium 8.9 - 10.3 mg/dL 10.0  9.5  9.7      +-------+-----------+-----------+------------+------------+  ABI/TBIToday's ABIToday's TBIPrevious ABIPrevious TBI  +-------+-----------+-----------+------------+------------+  Right  0.39       0.11       0.43        0             +-------+-----------+-----------+------------+------------+  Left   0.42       0.24       0.50        0.12          +-------+-----------+-----------+------------+------------+   Gloria Gallagher. Gloria Breed, MD [facility] Phone Number: [phone] 02/12/2022 3:43 PM  Total time spent on preparing this encounter including chart review, data review, collecting history, examining the patient, coordinating care for this established patient, 30 minutes  Portions of this report may have been transcribed using voice recognition software.  Every effort has been made to ensure accuracy; however, inadvertent computerized transcription errors may still be present.

## 2022-02-12 NOTE — H&P (View-Only) (Signed)
[facility] OF [place]  ASSESSMENT / PLAN: Gloria Gallagher is a 71 y.o. 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  This is concerning to me for potential bleeding event.  We will plan for revision on Friday, 02/16/2022.  I suspect there will be enough fistula to avoid placing a tunneled dialysis catheter.  CHIEF COMPLAINT: Ulceration of fistula  HISTORY OF PRESENT ILLNESS: Gloria Gallagher is a 71 y.o. female referred to clinic for evaluation of possible lower extremity arterial disease.  The patient describes paresthesias and electric discomfort in her right foot.  She does not report pain, per se.  She just describes an odd sensation which is bothersome to her.  She does not report symptoms classic of intermittent claudication.  She can walk without pain in her calves.  She has no ischemic ulcers about her foot  12/19/21: Patient returns to clinic for short interval follow-up.  She is doing well overall.  She describes claudication symptoms which are not disabling.  She has no ischemic rest pain.  She has no ulcers about her feet.  02/13/22: Returns to clinic for evaluation of fistula.  Fistula has developed ulceration in 2 areas, concerning her treating nephrologist.  She was referred for evaluation.  The patient reports no bleeding from this ulceration.  Past Medical History:  Diagnosis Date   Aortic atherosclerosis (HCC)    Arthritis    Atrial fibrillation (HCC)    CAD (coronary artery disease)    Dialysis patient ([place])    ESRD on hemodialysis ([place])    MWF at [facility]   Gout    Heart murmur    Hyperparathyroidism ([place])    Seasonal allergies     Past Surgical History:  Procedure Laterality Date   CARDIOVERSION N/A 05/16/2021   Procedure: CARDIOVERSION;  Surgeon: Acie Fredrickson Wonda Cheng, MD;  Location[facility] ENDOSCOPY;  Service: Cardiovascular;  Laterality: N/A;   CARDIOVERSION N/A 08/01/2021   Procedure: CARDIOVERSION;  Surgeon: Sanda Klein, MD;  Location: [place] ENDOSCOPY;  Service: Cardiovascular;  Laterality: N/A;   CARDIOVERSION N/A 12/05/2021   Procedure: CARDIOVERSION;  Surgeon: Berniece Salines, DO;  Location: [facility] ENDOSCOPY;  Service: Cardiovascular;  Laterality: N/A;    Family History  Problem Relation Age of Onset   Diabetes Sister    Stomach cancer Neg Hx    Colon cancer Neg Hx     Social History   Socioeconomic History   Marital status: Single    Spouse name: Not on file   Number of children: 1   Years of education: Not on file   Highest education level: Not on file  Occupational History   Occupation: retired  Tobacco Use   Smoking status: Former    Types: Cigarettes    Passive exposure: Never   Smokeless tobacco: Never   Tobacco comments:    Former smoker 04/25/2021  Vaping Use   Vaping Use: Never used  Substance and Sexual Activity   Alcohol use: Yes    Alcohol/week: 1.0 standard drink of alcohol    Types: 1 Cans of beer per week    Comment: 1 beer every other week 06/20/21   Drug use: Never   Sexual activity: Not Currently  Other Topics Concern   Not on file  Social History Narrative   Not on file   Social Determinants of Health   Financial Resource Strain: Not on file  Food Insecurity: No Food Insecurity (01/18/2022)   Hunger Vital Sign    Worried  About Running Out of Food in the Last Year: Never true    Ran Out of Food in the Last Year: Never true  Transportation Needs: No Transportation Needs (01/18/2022)   PRAPARE - Hydrologist (Medical): No    Lack of Transportation (Non-Medical): No  Physical Activity: Not on file  Stress: Not on file  Social Connections: Not on file  Intimate Partner Violence: Not At Risk (01/18/2022)   Humiliation, Afraid, Rape, and Kick questionnaire    Fear of Current or Ex-Partner: No    Emotionally Abused: No    Physically Abused: No    Sexually Abused: No    Allergies  Allergen Reactions   Penicillin G Hives and Itching    Shrimp (Diagnostic) Hives    Current Outpatient Medications  Medication Sig Dispense Refill   acetaminophen (TYLENOL) 500 MG tablet Take 2 tablets (1,000 mg total) by mouth every 6 (six) hours as needed for mild pain or moderate pain. 30 tablet 0   amiodarone (PACERONE) 200 MG tablet Take 1 tablet (200 mg total) by mouth daily. 90 tablet 2   atorvastatin (LIPITOR) 20 MG tablet Take 20 mg by mouth daily.     diclofenac Sodium (VOLTAREN) 1 % GEL Apply 4 g topically 4 (four) times daily. (Patient not taking: Reported on 01/18/2022) 100 g 0   Doxercalciferol (HECTOROL IV) Inject 1 Dose into the vein every Monday, Wednesday, and Friday with hemodialysis. During dialysis, 3X week     ELIQUIS 5 MG TABS tablet TAKE 1 TABLET BY MOUTH TWICE  DAILY 180 tablet 3   ferric citrate (AURYXIA) 1 GM 210 MG(Fe) tablet Take 1 tablet (210 mg total) by mouth 3 (three) times daily with meals. 270 tablet 1   multivitamin (RENA-VIT) TABS tablet Take 1 tablet by mouth daily.     No current facility-administered medications for this visit.    PHYSICAL EXAM There were no vitals filed for this visit.    Constitutional: Chronically ill-appearing woman in no acute distress Cardiac: Regular rate and rhythm.  Respiratory:  unlabored. Abdominal:  soft, non-tender, non-distended.  Peripheral vascular: No palpable pedal pulses.  No ulcers about the heel, foot, toes, interdigital spaces. Left upper extremity arteriovenous fistula with strong thrill.  Highly tortuous.  2 areas of ulceration without active bleeding.  PERTINENT LABORATORY AND RADIOLOGIC DATA  Most recent CBC    Latest Ref Rng & Units 01/19/2022    7:37 AM 01/18/2022    2:26 AM 01/17/2022    5:53 PM  CBC  WBC 4.0 - 10.5 K/uL 3.4  4.1  3.9   Hemoglobin 12.0 - 15.0 g/dL 10.2  10.5  11.7   Hematocrit 36.0 - 46.0 % 31.9  32.1  35.5   Platelets 150 - 400 K/uL 161  150  162      Most recent CMP    Latest Ref Rng & Units 01/19/2022    7:37 AM 01/18/2022    2:26  AM 01/17/2022    5:53 PM  CMP  Glucose 70 - 99 mg/dL 88  82  77   BUN 8 - 23 mg/dL 50  40  34   Creatinine 0.44 - 1.00 mg/dL 12.11  9.69  8.07   Sodium 135 - 145 mmol/L 134  134  135   Potassium 3.5 - 5.1 mmol/L 4.5  4.3  4.5   Chloride 98 - 111 mmol/L 91  95  93   CO2 22 - 32 mmol/L 27  25  26   Calcium 8.9 - 10.3 mg/dL 10.0  9.5  9.7      +-------+-----------+-----------+------------+------------+  ABI/TBIToday's ABIToday's TBIPrevious ABIPrevious TBI  +-------+-----------+-----------+------------+------------+  Right  0.39       0.11       0.43        0             +-------+-----------+-----------+------------+------------+  Left   0.42       0.24       0.50        0.12          +-------+-----------+-----------+------------+------------+   Yevonne Aline. Stanford Breed, MD [facility] Phone Number: [phone] 02/12/2022 3:43 PM  Total time spent on preparing this encounter including chart review, data review, collecting history, examining the patient, coordinating care for this established patient, 30 minutes  Portions of this report may have been transcribed using voice recognition software.  Every effort has been made to ensure accuracy; however, inadvertent computerized transcription errors may still be present.

## 2022-02-13 ENCOUNTER — Encounter: Payer: Self-pay | Admitting: Vascular Surgery

## 2022-02-13 ENCOUNTER — Other Ambulatory Visit: Payer: Self-pay

## 2022-02-13 ENCOUNTER — Ambulatory Visit (INDEPENDENT_AMBULATORY_CARE_PROVIDER_SITE_OTHER): Payer: Medicare Other | Admitting: Vascular Surgery

## 2022-02-13 VITALS — BP 185/72 | Temp 98.3°F | Resp 20 | Ht 65.0 in | Wt 151.0 lb

## 2022-02-13 DIAGNOSIS — N186 End stage renal disease: Secondary | ICD-10-CM | POA: Diagnosis not present

## 2022-02-13 DIAGNOSIS — Z992 Dependence on renal dialysis: Secondary | ICD-10-CM | POA: Diagnosis not present

## 2022-02-15 ENCOUNTER — Other Ambulatory Visit: Payer: Self-pay

## 2022-02-15 ENCOUNTER — Encounter (HOSPITAL_COMMUNITY): Payer: Self-pay | Admitting: Vascular Surgery

## 2022-02-15 NOTE — Progress Notes (Signed)
Spoke with pt's daughter, Joelene Millin for pre-op call. DPR on file. She states pt has hx of A-fib and is on Eliquis. Was instructed to hold 2 days prior to surgery by Dr. Stanford Breed. Last dose was 02/13/22 PM dose. Dr. Acie Fredrickson is her cardiologist. Had a recent cardioversion in July, 2023. Joelene Millin states pt is not diabetic.  Shower instructions given to [place] and she voiced understanding.

## 2022-02-16 ENCOUNTER — Ambulatory Visit (HOSPITAL_COMMUNITY)
Admission: RE | Admit: 2022-02-16 | Discharge: 2022-02-16 | Disposition: A | Discharge status: Home / Self Care | Payer: Medicare Other | Attending: Vascular Surgery | Admitting: Vascular Surgery

## 2022-02-16 ENCOUNTER — Ambulatory Visit (HOSPITAL_COMMUNITY): Payer: Medicare Other | Admitting: Certified Registered"

## 2022-02-16 ENCOUNTER — Encounter (HOSPITAL_COMMUNITY): Payer: Self-pay | Admitting: Vascular Surgery

## 2022-02-16 ENCOUNTER — Ambulatory Visit (HOSPITAL_BASED_OUTPATIENT_CLINIC_OR_DEPARTMENT_OTHER): Payer: Medicare Other | Admitting: Certified Registered"

## 2022-02-16 ENCOUNTER — Other Ambulatory Visit: Payer: Self-pay

## 2022-02-16 ENCOUNTER — Encounter (HOSPITAL_COMMUNITY)
Admission: RE | Disposition: A | Discharge status: Home / Self Care | Payer: Self-pay | Source: Home / Self Care | Attending: Vascular Surgery

## 2022-02-16 DIAGNOSIS — D631 Anemia in chronic kidney disease: Secondary | ICD-10-CM

## 2022-02-16 DIAGNOSIS — N186 End stage renal disease: Secondary | ICD-10-CM | POA: Diagnosis not present

## 2022-02-16 DIAGNOSIS — T82898A Other specified complication of vascular prosthetic devices, implants and grafts, initial encounter: Secondary | ICD-10-CM | POA: Diagnosis present

## 2022-02-16 DIAGNOSIS — M1990 Unspecified osteoarthritis, unspecified site: Secondary | ICD-10-CM | POA: Insufficient documentation

## 2022-02-16 DIAGNOSIS — X58XXXA Exposure to other specified factors, initial encounter: Secondary | ICD-10-CM | POA: Diagnosis not present

## 2022-02-16 DIAGNOSIS — I120 Hypertensive chronic kidney disease with stage 5 chronic kidney disease or end stage renal disease: Secondary | ICD-10-CM

## 2022-02-16 DIAGNOSIS — N185 Chronic kidney disease, stage 5: Secondary | ICD-10-CM | POA: Diagnosis not present

## 2022-02-16 DIAGNOSIS — T82590A Other mechanical complication of surgically created arteriovenous fistula, initial encounter: Secondary | ICD-10-CM

## 2022-02-16 DIAGNOSIS — L98498 Non-pressure chronic ulcer of skin of other sites with other specified severity: Secondary | ICD-10-CM | POA: Insufficient documentation

## 2022-02-16 DIAGNOSIS — Z992 Dependence on renal dialysis: Secondary | ICD-10-CM

## 2022-02-16 DIAGNOSIS — K219 Gastro-esophageal reflux disease without esophagitis: Secondary | ICD-10-CM | POA: Insufficient documentation

## 2022-02-16 DIAGNOSIS — Z79899 Other long term (current) drug therapy: Secondary | ICD-10-CM | POA: Diagnosis not present

## 2022-02-16 DIAGNOSIS — I2510 Atherosclerotic heart disease of native coronary artery without angina pectoris: Secondary | ICD-10-CM | POA: Insufficient documentation

## 2022-02-16 DIAGNOSIS — I4891 Unspecified atrial fibrillation: Secondary | ICD-10-CM | POA: Insufficient documentation

## 2022-02-16 DIAGNOSIS — Z7901 Long term (current) use of anticoagulants: Secondary | ICD-10-CM | POA: Diagnosis not present

## 2022-02-16 DIAGNOSIS — Z1152 Encounter for screening for COVID-19: Secondary | ICD-10-CM | POA: Insufficient documentation

## 2022-02-16 DIAGNOSIS — Z87891 Personal history of nicotine dependence: Secondary | ICD-10-CM | POA: Diagnosis not present

## 2022-02-16 HISTORY — DX: Gastro-esophageal reflux disease without esophagitis: K21.9

## 2022-02-16 HISTORY — DX: Myoneural disorder, unspecified: G70.9

## 2022-02-16 HISTORY — DX: Anemia, unspecified: D64.9

## 2022-02-16 HISTORY — DX: Pneumonia, unspecified organism: J18.9

## 2022-02-16 HISTORY — PX: REVISON OF ARTERIOVENOUS FISTULA: SHX6074

## 2022-02-16 HISTORY — DX: Essential (primary) hypertension: I10

## 2022-02-16 LAB — POCT I-STAT, CHEM 8
BUN: 33 mg/dL — ABNORMAL HIGH (ref 8–23)
Calcium, Ion: 1.17 mmol/L (ref 1.15–1.40)
Chloride: 94 mmol/L — ABNORMAL LOW (ref 98–111)
Creatinine, Ser: 8.5 mg/dL — ABNORMAL HIGH (ref 0.44–1.00)
Glucose, Bld: 88 mg/dL (ref 70–99)
HCT: 32 % — ABNORMAL LOW (ref 36.0–46.0)
Hemoglobin: 10.9 g/dL — ABNORMAL LOW (ref 12.0–15.0)
Potassium: 3.5 mmol/L (ref 3.5–5.1)
Sodium: 134 mmol/L — ABNORMAL LOW (ref 135–145)
TCO2: 30 mmol/L (ref 22–32)

## 2022-02-16 LAB — SARS CORONAVIRUS 2 BY RT PCR: SARS Coronavirus 2 by RT PCR: NEGATIVE

## 2022-02-16 SURGERY — REVISON OF ARTERIOVENOUS FISTULA
Anesthesia: Regional | Site: Arm Upper | Laterality: Left

## 2022-02-16 MED ORDER — ORAL CARE MOUTH RINSE: 15.0000 mL | Freq: Once | OROMUCOSAL | Status: AC

## 2022-02-16 MED ORDER — FENTANYL CITRATE (PF) 100 MCG/2ML IJ SOLN
INTRAMUSCULAR | Status: AC
  Administered 2022-02-16: 50 ug via INTRAVENOUS
  Filled 2022-02-16: qty 2

## 2022-02-16 MED ORDER — VANCOMYCIN HCL IN DEXTROSE 1-5 GM/200ML-% IV SOLN
INTRAVENOUS | Status: AC
  Administered 2022-02-16: 1000 mg via INTRAVENOUS
  Filled 2022-02-16: qty 200

## 2022-02-16 MED ORDER — CHLORHEXIDINE GLUCONATE 4 % EX LIQD: 60.0000 mL | Freq: Once | CUTANEOUS | Status: DC

## 2022-02-16 MED ORDER — MIDAZOLAM HCL 2 MG/2ML IJ SOLN: 1.0000 mg | Freq: Once | INTRAMUSCULAR | Status: AC

## 2022-02-16 MED ORDER — LIDOCAINE 2% (20 MG/ML) 5 ML SYRINGE
INTRAMUSCULAR | Status: AC
  Filled 2022-02-16: qty 5

## 2022-02-16 MED ORDER — FENTANYL CITRATE (PF) 100 MCG/2ML IJ SOLN: 50.0000 ug | Freq: Once | INTRAMUSCULAR | Status: AC

## 2022-02-16 MED ORDER — HEPARIN 6000 UNIT IRRIGATION SOLUTION
Status: AC
  Filled 2022-02-16: qty 500

## 2022-02-16 MED ORDER — VANCOMYCIN HCL IN DEXTROSE 1-5 GM/200ML-% IV SOLN: 1000.0000 mg | INTRAVENOUS | Status: AC

## 2022-02-16 MED ORDER — ACETAMINOPHEN 500 MG PO TABS
ORAL_TABLET | ORAL | Status: AC
  Filled 2022-02-16: qty 2

## 2022-02-16 MED ORDER — LIDOCAINE 2% (20 MG/ML) 5 ML SYRINGE
INTRAMUSCULAR | Status: DC | PRN
  Administered 2022-02-16: 10 mg via INTRAVENOUS

## 2022-02-16 MED ORDER — PROPOFOL 500 MG/50ML IV EMUL
INTRAVENOUS | Status: DC | PRN
  Administered 2022-02-16: 20 ug/kg/min via INTRAVENOUS

## 2022-02-16 MED ORDER — MIDAZOLAM HCL 2 MG/2ML IJ SOLN: 0.5000 mg | Freq: Once | INTRAMUSCULAR | Status: DC | PRN

## 2022-02-16 MED ORDER — ONDANSETRON HCL 4 MG/2ML IJ SOLN
INTRAMUSCULAR | Status: AC
  Filled 2022-02-16: qty 2

## 2022-02-16 MED ORDER — OXYCODONE-ACETAMINOPHEN 5-325 MG PO TABS: 1.0000 | ORAL_TABLET | Freq: Four times a day (QID) | ORAL | 0 refills | Status: DC | PRN

## 2022-02-16 MED ORDER — CHLORHEXIDINE GLUCONATE 0.12 % MT SOLN: 15.0000 mL | Freq: Once | OROMUCOSAL | Status: AC

## 2022-02-16 MED ORDER — OXYCODONE HCL 5 MG PO TABS: 5.0000 mg | ORAL_TABLET | Freq: Once | ORAL | Status: DC | PRN

## 2022-02-16 MED ORDER — PROPOFOL 1000 MG/100ML IV EMUL
INTRAVENOUS | Status: AC
  Filled 2022-02-16: qty 100

## 2022-02-16 MED ORDER — CHLORHEXIDINE GLUCONATE 0.12 % MT SOLN
OROMUCOSAL | Status: AC
  Administered 2022-02-16: 15 mL via OROMUCOSAL
  Filled 2022-02-16: qty 15

## 2022-02-16 MED ORDER — ONDANSETRON HCL 4 MG/2ML IJ SOLN
INTRAMUSCULAR | Status: DC | PRN
  Administered 2022-02-16: 4 mg via INTRAVENOUS

## 2022-02-16 MED ORDER — ACETAMINOPHEN 500 MG PO TABS
1000.0000 mg | ORAL_TABLET | Freq: Once | ORAL | Status: AC
  Administered 2022-02-16: 1000 mg via ORAL

## 2022-02-16 MED ORDER — OXYCODONE HCL 5 MG/5ML PO SOLN: 5.0000 mg | Freq: Once | ORAL | Status: DC | PRN

## 2022-02-16 MED ORDER — HEPARIN 6000 UNIT IRRIGATION SOLUTION
Status: DC | PRN
  Administered 2022-02-16: 1

## 2022-02-16 MED ORDER — ALBUTEROL SULFATE (2.5 MG/3ML) 0.083% IN NEBU
INHALATION_SOLUTION | RESPIRATORY_TRACT | Status: AC
  Administered 2022-02-16: 2.5 mg via RESPIRATORY_TRACT
  Filled 2022-02-16: qty 3

## 2022-02-16 MED ORDER — PROMETHAZINE HCL 25 MG/ML IJ SOLN: 6.2500 mg | INTRAMUSCULAR | Status: DC | PRN

## 2022-02-16 MED ORDER — HEMOSTATIC AGENTS (NO CHARGE) OPTIME
TOPICAL | Status: DC | PRN
  Administered 2022-02-16: 1 via TOPICAL

## 2022-02-16 MED ORDER — ALBUTEROL SULFATE (2.5 MG/3ML) 0.083% IN NEBU: 2.5000 mg | INHALATION_SOLUTION | Freq: Once | RESPIRATORY_TRACT | Status: AC

## 2022-02-16 MED ORDER — LIDOCAINE-EPINEPHRINE (PF) 1.5 %-1:200000 IJ SOLN
INTRAMUSCULAR | Status: DC | PRN
  Administered 2022-02-16: 30 mL via PERINEURAL

## 2022-02-16 MED ORDER — SODIUM CHLORIDE 0.9 % IV SOLN: INTRAVENOUS | Status: DC

## 2022-02-16 MED ORDER — FENTANYL CITRATE (PF) 100 MCG/2ML IJ SOLN: 25.0000 ug | INTRAMUSCULAR | Status: DC | PRN

## 2022-02-16 MED ORDER — MIDAZOLAM HCL 2 MG/2ML IJ SOLN
INTRAMUSCULAR | Status: AC
  Administered 2022-02-16: 1 mg via INTRAVENOUS
  Filled 2022-02-16: qty 2

## 2022-02-16 MED ORDER — PHENYLEPHRINE 80 MCG/ML (10ML) SYRINGE FOR IV PUSH (FOR BLOOD PRESSURE SUPPORT)
PREFILLED_SYRINGE | INTRAVENOUS | Status: AC
  Filled 2022-02-16: qty 10

## 2022-02-16 MED ORDER — EPHEDRINE 5 MG/ML INJ
INTRAVENOUS | Status: AC
  Filled 2022-02-16: qty 5

## 2022-02-16 SURGICAL SUPPLY — 42 items
ARMBAND PINK RESTRICT EXTREMIT (MISCELLANEOUS) ×1 IMPLANT
BENZOIN TINCTURE PRP APPL 2/3 (GAUZE/BANDAGES/DRESSINGS) ×1 IMPLANT
BNDG ELASTIC 4X5.8 VLCR STR LF (GAUZE/BANDAGES/DRESSINGS) IMPLANT
BNDG GAUZE DERMACEA FLUFF 4 (GAUZE/BANDAGES/DRESSINGS) IMPLANT
CANISTER SUCT 3000ML PPV (MISCELLANEOUS) ×1 IMPLANT
CANNULA VESSEL 3MM 2 BLNT TIP (CANNULA) ×1 IMPLANT
CHLORAPREP W/TINT 26 (MISCELLANEOUS) ×1 IMPLANT
CLIP LIGATING EXTRA MED SLVR (CLIP) IMPLANT
CLIP LIGATING EXTRA SM BLUE (MISCELLANEOUS) IMPLANT
COVER PROBE W GEL 5X96 (DRAPES) IMPLANT
DRSG COVADERM 4X10 (GAUZE/BANDAGES/DRESSINGS) IMPLANT
DRSG COVADERM 4X6 (GAUZE/BANDAGES/DRESSINGS) IMPLANT
ELECT REM PT RETURN 9FT ADLT (ELECTROSURGICAL) ×1
ELECTRODE REM PT RTRN 9FT ADLT (ELECTROSURGICAL) ×1 IMPLANT
GAUZE SPONGE 4X4 12PLY STRL (GAUZE/BANDAGES/DRESSINGS) IMPLANT
GLOVE BIO SURGEON STRL SZ8 (GLOVE) ×1 IMPLANT
GOWN STRL REUS W/ TWL LRG LVL3 (GOWN DISPOSABLE) ×2 IMPLANT
GOWN STRL REUS W/ TWL XL LVL3 (GOWN DISPOSABLE) ×1 IMPLANT
GOWN STRL REUS W/TWL LRG LVL3 (GOWN DISPOSABLE) ×2
GOWN STRL REUS W/TWL XL LVL3 (GOWN DISPOSABLE) ×1
HEMOSTAT SNOW SURGICEL 2X4 (HEMOSTASIS) IMPLANT
KIT BASIN OR (CUSTOM PROCEDURE TRAY) ×1 IMPLANT
KIT TURNOVER KIT B (KITS) ×1 IMPLANT
LOOP VESSEL MINI RED (MISCELLANEOUS) IMPLANT
NS IRRIG 1000ML POUR BTL (IV SOLUTION) ×1 IMPLANT
PACK CV ACCESS (CUSTOM PROCEDURE TRAY) ×1 IMPLANT
PAD ARMBOARD 7.5X6 YLW CONV (MISCELLANEOUS) ×2 IMPLANT
SLING ARM FOAM STRAP LRG (SOFTGOODS) IMPLANT
SLING ARM FOAM STRAP MED (SOFTGOODS) IMPLANT
SPONGE T-LAP 18X18 ~~LOC~~+RFID (SPONGE) IMPLANT
STRIP CLOSURE SKIN 1/2X4 (GAUZE/BANDAGES/DRESSINGS) ×1 IMPLANT
SUT MNCRL AB 4-0 PS2 18 (SUTURE) ×1 IMPLANT
SUT PROLENE 5 0 C 1 24 (SUTURE) IMPLANT
SUT PROLENE 6 0 BV (SUTURE) ×1 IMPLANT
SUT VIC AB 2-0 CT1 27 (SUTURE) ×1
SUT VIC AB 2-0 CT1 TAPERPNT 27 (SUTURE) IMPLANT
SUT VIC AB 3-0 SH 27 (SUTURE) ×1
SUT VIC AB 3-0 SH 27X BRD (SUTURE) ×1 IMPLANT
SYR 3ML LL SCALE MARK (SYRINGE) ×1 IMPLANT
TOWEL GREEN STERILE (TOWEL DISPOSABLE) ×1 IMPLANT
UNDERPAD 30X36 HEAVY ABSORB (UNDERPADS AND DIAPERS) ×1 IMPLANT
WATER STERILE IRR 1000ML POUR (IV SOLUTION) ×1 IMPLANT

## 2022-02-16 NOTE — Interval H&P Note (Signed)
History and Physical Interval Note:  02/16/2022 1:27 PM  Gloria Gallagher  has presented today for surgery, with the diagnosis of ESRD.  The various methods of treatment have been discussed with the patient and family. After consideration of risks, benefits and other options for treatment, the patient has consented to  Procedure(s) with comments: LEFT ARM ARTERIOVENOUS FISTULA REVISION (Left) - PERIPHERAL NERVE BLOCK as a surgical intervention.  The patient's history has been reviewed, patient examined, no change in status, stable for surgery.  I have reviewed the patient's chart and labs.  Questions were answered to the patient's satisfaction.     Cherre Robins

## 2022-02-16 NOTE — Anesthesia Preprocedure Evaluation (Addendum)
Anesthesia Evaluation  Patient identified by MRN, date of birth, ID band Patient awake    Reviewed: Allergy & Precautions, NPO status , Patient's Chart, lab work & pertinent test results  History of Anesthesia Complications Negative for: history of anesthetic complications  Airway Mallampati: II  TM Distance: >3 FB Neck ROM: Full    Dental  (+) Poor Dentition, Dental Advisory Given   Pulmonary Recent URI  (current UTI and cough), Residual Cough, former smoker   breath sounds clear to auscultation       Cardiovascular hypertension (no longer on meds), + CAD  + dysrhythmias Atrial Fibrillation + Valvular Problems/Murmurs AI  Rhythm:Regular Rate:Normal + Systolic murmurs 07/4915 ECHO: EF 55-60%. 1. The LV has normal function, no regional wall motion abnormalities. LV was mildly dilated, moderate LVH 2. RVF is mildly reduced.  3. LA severely dilated.  4. RA moderately dilated.  5. The mitral valve is normal in structure. Mild MR.  6. The aortic valve is bicuspid. AI is moderate-severe. Aortic valve sclerosis is present, with no evidence of aortic valve stenosis.     Neuro/Psych   CT head without intercranial injury CT of the C-spine with a odontoid type II fracture.  I discussed the case with neurosurgery on-call Reinaldo Meeker, NP recommends hard collar at all times follow-up in the office in about 4 weeks   C-spine not cleared    GI/Hepatic Neg liver ROS,GERD  Controlled,,  Endo/Other  negative endocrine ROS    Renal/GU ESRF and DialysisRenal diseaseK+ 3.5     Musculoskeletal  (+) Arthritis ,    Abdominal   Peds  Hematology  (+) Blood dyscrasia (Hb 10.9), anemia eliquis   Anesthesia Other Findings   Reproductive/Obstetrics                             Anesthesia Physical Anesthesia Plan  ASA: 4  Anesthesia Plan: Regional   Post-op Pain Management: Tylenol PO (pre-op)*   Induction:   PONV  Risk Score and Plan: 2 and Ondansetron and Treatment may vary due to age or medical condition  Airway Management Planned: Natural Airway and Simple Face Mask  Additional Equipment: None  Intra-op Plan:   Post-operative Plan:   Informed Consent: I have reviewed the patients History and Physical, chart, labs and discussed the procedure including the risks, benefits and alternatives for the proposed anesthesia with the patient or authorized representative who has indicated his/her understanding and acceptance.     Dental advisory given  Plan Discussed with: CRNA and Surgeon  Anesthesia Plan Comments: (Plan routine monitors, supraclavicular block  Discussed with Dr. Stanford Breed, pt has URI with non-productive cough, COVID neg.  Her AVF is very ulcerated and at risk for rupture, will proceed with repair under regional block)       Anesthesia Quick Evaluation

## 2022-02-16 NOTE — Op Note (Signed)
DATE OF SERVICE: 02/16/2022  PATIENT:  Gloria Gallagher  71 y.o. female  PRE-OPERATIVE DIAGNOSIS:  ulcerated left arm arteriovenous fistula  POST-OPERATIVE DIAGNOSIS:  Same  PROCEDURE:   revision left arm arteriovenous fistula with excision and end-to-end re-anastomosis  SURGEON:  Surgeon(s) and Role:    * Cherre Robins, MD - Primary  ASSISTANT: Gerri Lins, PA-C  An experienced assistant was required given the complexity of this procedure and the standard of surgical care. My assistant helped with exposure through counter tension, suctioning, ligation and retraction to better visualize the surgical field.  My assistant expedited sewing during the case by following my sutures. Wherever I use the term "we" in the report, my assistant actively helped me with that portion of the procedure.  ANESTHESIA:   regional and MAC  EBL: 150m  BLOOD ADMINISTERED:none  DRAINS: none   LOCAL MEDICATIONS USED:  NONE  SPECIMEN:  none  COUNTS: confirmed correct.  TOURNIQUET: none  PATIENT DISPOSITION:  PACU - hemodynamically stable.   Delay start of Pharmacological VTE agent (>24hrs) due to surgical blood loss or risk of bleeding: no  INDICATION FOR PROCEDURE: SKATHLYN LEACHMANis a 71y.o. female with ulcerated left arm arteriovenous fistula. After careful discussion of risks, benefits, and alternatives the patient was offered revision of fistula. The patient understood and wished to proceed.  OPERATIVE FINDINGS: Markedly tortuous fistula with 2 areas of ulceration.  Mobilize a significant segment of the fistula to allow end to end anastomosis.  The ulcerated area was excised.  Anastomosis completed.  Good thrill achieved through the fistula.  More than adequate area left for cannulation above and below the revision site.  DESCRIPTION OF PROCEDURE: After identification of the patient in the pre-operative holding area, the patient was transferred to the operating room. The patient was  positioned supine on the operating room table. Anesthesia was induced. The left arm was prepped and draped in standard fashion. A surgical pause was performed confirming correct patient, procedure, and operative location.  On the left the form incision was made around the ulcerated area of fistula in the left arm.  Incision was carried down through subcutaneous tissue until the fistula was encountered.  The fistula was skeletonized away from the elbow to the proximal arm.  A sufficient length was mobilized to allow end-to-end anastomosis with simple excision of the ulcerated fistula segment.  Clamps were applied to the proximal and distal fistula.  The intervening, ulcerated segment was excised.  The 2 ends were then anastomosed end to end using 5-0 Prolene in continuous running fashion.  Prior to completion the anastomosis was flushed and de-aired.  Clamp was released on the venous outflow.  Clamp was released on the arterial inflow.  Good thrill was achieved.  Hemostasis was achieved in the surgical bed.  The wound was closed using 2-0 Vicryl and a surgical stapler.  A bulky bandage was applied.  Upon completion of the case instrument and sharps counts were confirmed correct. The patient was transferred to the PACU in good condition. I was present for all portions of the procedure.  TYevonne Aline HStanford Breed MD [facility]Phone Number: ([phone]/10/2021 3:08 PM

## 2022-02-16 NOTE — Anesthesia Procedure Notes (Signed)
Procedure Name: MAC Date/Time: 02/16/2022 1:46 PM  Performed by: Barrington Ellison, CRNAPre-anesthesia Checklist: Patient identified, Emergency Drugs available, Suction available, Patient being monitored and Timeout performed Patient Re-evaluated:Patient Re-evaluated prior to induction Oxygen Delivery Method: Simple face mask

## 2022-02-16 NOTE — Progress Notes (Addendum)
09:52. Dr Glennon Mac notified BP 184/64, pt c/o non productive cough. Pt denies COVID exposure or other COVID symptoms. Pt states her soft neck collar is with her belongings in PACU. C collar placed at bedside for patient to wear in OR.   10:40 AM Dr Glennon Mac assessed patient and asked for COVID test to be performed d/t pt's cough. Covid test walked down to lab and lab notified of STAT Covid test.

## 2022-02-16 NOTE — Anesthesia Procedure Notes (Signed)
Anesthesia Regional Block: Supraclavicular block   Pre-Anesthetic Checklist: , timeout performed,  Correct Patient, Correct Site, Correct Laterality,  Correct Procedure, Correct Position, site marked,  Risks and benefits discussed,  Surgical consent,  Pre-op evaluation,  At surgeon's request and post-op pain management  Laterality: Left and Upper  Prep: chloraprep       Needles:  Injection technique: Single-shot  Needle Type: Echogenic Needle     Needle Length: 9cm  Needle Gauge: 21     Additional Needles:   Procedures:,,,, ultrasound used (permanent image in chart),,    Narrative:  Start time: 02/16/2022 12:33 PM End time: 02/16/2022 12:39 PM Injection made incrementally with aspirations every 5 mL.  Performed by: Personally  Anesthesiologist: Annye Asa, MD  Additional Notes: Pt identified in Holding room.  Monitors applied. Working IV access confirmed. Sterile prep L clavicle and neck.  #21ga ECHOgenic Arrow block needle to supraclavicular brachial plexus with US guidance.  30cc 1.55% Lidocaine 1:200k epi injected incrementally after negative test dose.  Patient asymptomatic, VSS, no heme aspirated, tolerated well.   Jenita Seashore, MD

## 2022-02-16 NOTE — Anesthesia Postprocedure Evaluation (Signed)
Anesthesia Post Note  Patient: Gloria Gallagher  Procedure(s) Performed: LEFT ARM ARTERIOVENOUS FISTULA REVISION (Left: Arm Upper)     Patient location during evaluation: PACU Anesthesia Type: Regional and MAC Level of consciousness: awake and alert, oriented, awake and patient cooperative Pain management: pain level controlled Vital Signs Assessment: post-procedure vital signs reviewed and stable Respiratory status: spontaneous breathing, nonlabored ventilation and respiratory function stable (pt not coughing, breathing comfortably at present) Cardiovascular status: blood pressure returned to baseline and stable Postop Assessment: no apparent nausea or vomiting Anesthetic complications: no Comments: Pt for dialysis tomorrow   No notable events documented.  Last Vitals:  Vitals:   02/16/22 1530 02/16/22 1545  BP: (!) 153/60 (!) 177/67  Pulse: (!) 57 (!) 56  Resp: 19 20  Temp:    SpO2: 98% 94%    Last Pain:  Vitals:   02/16/22 1545  TempSrc:   PainSc: 0-No pain                 Alya Smaltz,E. Loan [place]gu[place]

## 2022-02-16 NOTE — Discharge Instructions (Signed)
Keep dry draining in place until next HD.  They may stick above and below the new incision for HD.  Dry dressing as needed.  May shower in 48 hours.

## 2022-02-16 NOTE — Transfer of Care (Signed)
Immediate Anesthesia Transfer of Care Note  Patient: Gloria Gallagher  Procedure(s) Performed: LEFT ARM ARTERIOVENOUS FISTULA REVISION (Left: Arm Upper)  Patient Location: PACU  Anesthesia Type:MAC and Regional  Level of Consciousness: awake and oriented  Airway & Oxygen Therapy: Patient Spontanous Breathing and Patient connected to face mask oxygen  Post-op Assessment: Report given to RN  Post vital signs: Reviewed and stable  Last Vitals:  Vitals Value Taken Time  BP 141/65   Temp    Pulse 60 02/16/22 1517  Resp 19 02/16/22 1517  SpO2 100 % 02/16/22 1517  Vitals shown include unvalidated device data.  Last Pain:  Vitals:   02/16/22 0827  TempSrc:   PainSc: 0-No pain         Complications: No notable events documented.

## 2022-02-17 ENCOUNTER — Encounter (HOSPITAL_COMMUNITY): Payer: Self-pay | Admitting: Vascular Surgery

## 2022-02-19 ENCOUNTER — Telehealth: Payer: Self-pay | Admitting: Physician Assistant

## 2022-02-19 NOTE — Telephone Encounter (Signed)
-----   Message from Gloria Gallagher, [place] sent at 02/16/2022  3:09 PM EDT -----  Fistula revision f/u in 3-4 weeks for staple removal in pa clinic on Tuesday when [place] there.

## 2022-02-20 ENCOUNTER — Telehealth: Payer: Self-pay

## 2022-02-20 ENCOUNTER — Ambulatory Visit: Payer: Medicare Other | Admitting: Cardiovascular Disease

## 2022-02-20 NOTE — Telephone Encounter (Signed)
Pt called asking when her staples will be removed.  Reviewed pt's chart, returned call for clarification, two identifiers used. Informed her that it will be done at her post-op f/u on 10/31. Confirmed understanding.

## 2022-03-12 NOTE — Progress Notes (Unsigned)
[facility] [place]  ASSESSMENT / PLAN: Gloria Gallagher is a 71 y.o. 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  This is concerning to me for potential bleeding event.  We will plan for revision on Friday, 02/16/2022.  I suspect there will be enough fistula to avoid placing a tunneled dialysis catheter.  CHIEF COMPLAINT: Ulceration of fistula  HISTORY OF PRESENT ILLNESS: Gloria Gallagher is a 71 y.o. female referred to clinic for evaluation of possible lower extremity arterial disease.  The patient describes paresthesias and electric discomfort in her right foot.  She does not report pain, per se.  She just describes an odd sensation which is bothersome to her.  She does not report symptoms classic of intermittent claudication.  She can walk without pain in her calves.  She has no ischemic ulcers about her foot  12/19/21: Patient returns to clinic for short interval follow-up.  She is doing well overall.  She describes claudication symptoms which are not disabling.  She has no ischemic rest pain.  She has no ulcers about her feet.  02/13/22: Returns to clinic for evaluation of fistula.  Fistula has developed ulceration in 2 areas, concerning her treating nephrologist.  She was referred for evaluation.  The patient reports no bleeding from this ulceration.  Past Medical History:  Diagnosis Date   Anemia    Aortic atherosclerosis (HCC)    Arthritis    Atrial fibrillation (HCC)    CAD (coronary artery disease)    COVID 2021   mild   Dialysis patient ([facility])    ESRD on hemodialysis ([place])    MWF at [facility]   GERD (gastroesophageal reflux disease)    Gout    Heart murmur    Hyperparathyroidism ([place])    Hypertension    in the past, no longer takes medications   Neuromuscular disorder (HCC)    neuropathy   Pneumonia    Seasonal allergies     Past Surgical History:  Procedure Laterality Date   CARDIOVERSION N/A 05/16/2021   Procedure:  CARDIOVERSION;  Surgeon: Acie Fredrickson Wonda Cheng, MD;  Location: [facility] ENDOSCOPY;  Service: Cardiovascular;  Laterality: N/A;   CARDIOVERSION N/A 08/01/2021   Procedure: CARDIOVERSION;  Surgeon: Sanda Klein, MD;  Location: [facility] ENDOSCOPY;  Service: Cardiovascular;  Laterality: N/A;   CARDIOVERSION N/A 12/05/2021   Procedure: CARDIOVERSION;  Surgeon: Berniece Salines, DO;  Location: [place];  Service: Cardiovascular;  Laterality: N/A;   DIALYSIS FISTULA CREATION Left    REVISON OF ARTERIOVENOUS FISTULA Left 02/16/2022   Procedure: LEFT ARM ARTERIOVENOUS FISTULA REVISION;  Surgeon: Cherre Robins, MD;  Location: [facility] OR;  Service: Vascular;  Laterality: Left;  PERIPHERAL NERVE BLOCK   TUBAL LIGATION      Family History  Problem Relation Age of Onset   Diabetes Sister    Stomach cancer Neg Hx    Colon cancer Neg Hx     Social History   Socioeconomic History   Marital status: Single    Spouse name: Not on file   Number of children: 1   Years of education: Not on file   Highest education level: Not on file  Occupational History   Occupation: retired  Tobacco Use   Smoking status: Former    Types: Cigarettes    Passive exposure: Never   Smokeless tobacco: Never   Tobacco comments:    Former smoker 04/25/2021  Vaping Use   Vaping Use: Never used  Substance and Sexual Activity  Alcohol use: Yes    Alcohol/week: 1.0 standard drink of alcohol    Types: 1 Cans of beer per week    Comment: 1 beer every other week 06/20/21   Drug use: Never   Sexual activity: Not Currently  Other Topics Concern   Not on file  Social History Narrative   Not on file   Social Determinants of Health   Financial Resource Strain: Not on file  Food Insecurity: No Food Insecurity (01/18/2022)   Hunger Vital Sign    Worried About Running Out of Food in the Last Year: Never true    Ran Out of Food in the Last Year: Never true  Transportation Needs: No Transportation Needs (01/18/2022)   PRAPARE -  Hydrologist (Medical): No    Lack of Transportation (Non-Medical): No  Physical Activity: Not on file  Stress: Not on file  Social Connections: Not on file  Intimate Partner Violence: Not At Risk (01/18/2022)   Humiliation, Afraid, Rape, and Kick questionnaire    Fear of Current or Ex-Partner: No    Emotionally Abused: No    Physically Abused: No    Sexually Abused: No    Allergies  Allergen Reactions   Penicillin G Hives and Itching   Shrimp (Diagnostic) Hives    Current Outpatient Medications  Medication Sig Dispense Refill   acetaminophen (TYLENOL) 500 MG tablet Take 2 tablets (1,000 mg total) by mouth every 6 (six) hours as needed for mild pain or moderate pain. 30 tablet 0   amiodarone (PACERONE) 200 MG tablet Take 1 tablet (200 mg total) by mouth daily. 90 tablet 2   atorvastatin (LIPITOR) 20 MG tablet Take 20 mg by mouth daily.     diclofenac Sodium (VOLTAREN) 1 % GEL Apply 4 g topically 4 (four) times daily. (Patient not taking: Reported on 02/16/2022) 100 g 0   Doxercalciferol (HECTOROL IV) Inject 1 Dose into the vein every Monday, Wednesday, and Friday with hemodialysis. During dialysis, 3X week     ELIQUIS 5 MG TABS tablet TAKE 1 TABLET BY MOUTH TWICE  DAILY 180 tablet 3   ferric citrate (AURYXIA) 1 GM 210 MG(Fe) tablet Take 1 tablet (210 mg total) by mouth 3 (three) times daily with meals. 270 tablet 1   oxyCODONE-acetaminophen (PERCOCET/ROXICET) 5-325 MG tablet Take 1 tablet by mouth every 6 (six) hours as needed. 30 tablet 0   No current facility-administered medications for this visit.    PHYSICAL EXAM There were no vitals filed for this visit.    Constitutional: Chronically ill-appearing woman in no acute distress Cardiac: Regular rate and rhythm.  Respiratory:  unlabored. Abdominal:  soft, non-tender, non-distended.  Peripheral vascular: No palpable pedal pulses.  No ulcers about the heel, foot, toes, interdigital spaces. Left  upper extremity arteriovenous fistula with strong thrill.  Highly tortuous.  2 areas of ulceration without active bleeding.  PERTINENT LABORATORY AND RADIOLOGIC DATA  Most recent CBC    Latest Ref Rng & Units 02/16/2022    8:59 AM 01/19/2022    7:37 AM 01/18/2022    2:26 AM  CBC  WBC 4.0 - 10.5 K/uL  3.4  4.1   Hemoglobin 12.0 - 15.0 g/dL 10.9  10.2  10.5   Hematocrit 36.0 - 46.0 % 32.0  31.9  32.1   Platelets 150 - 400 K/uL  161  150      Most recent CMP    Latest Ref Rng & Units 02/16/2022  8:59 AM 01/19/2022    7:37 AM 01/18/2022    2:26 AM  CMP  Glucose 70 - 99 mg/dL 88  88  82   BUN 8 - 23 mg/dL 33  50  40   Creatinine 0.44 - 1.00 mg/dL 8.50  12.11  9.69   Sodium 135 - 145 mmol/L 134  134  134   Potassium 3.5 - 5.1 mmol/L 3.5  4.5  4.3   Chloride 98 - 111 mmol/L 94  91  95   CO2 22 - 32 mmol/L  27  25   Calcium 8.9 - 10.3 mg/dL  10.0  9.5      +-------+-----------+-----------+------------+------------+  ABI/TBIToday's ABIToday's TBIPrevious ABIPrevious TBI  +-------+-----------+-----------+------------+------------+  Right  0.39       0.11       0.43        0             +-------+-----------+-----------+------------+------------+  Left   0.42       0.24       0.50        0.12          +-------+-----------+-----------+------------+------------+   Yevonne Aline. Stanford Breed, MD [facility] Phone Number: [phone] 03/12/2022 5:28 PM  Total time spent on preparing this encounter including chart review, data review, collecting history, examining the patient, coordinating care for this established patient, 30 minutes  Portions of this report may have been transcribed using voice recognition software.  Every effort has been made to ensure accuracy; however, inadvertent computerized transcription errors may still be present.

## 2022-03-13 ENCOUNTER — Encounter: Payer: Self-pay | Admitting: Vascular Surgery

## 2022-03-13 ENCOUNTER — Ambulatory Visit (INDEPENDENT_AMBULATORY_CARE_PROVIDER_SITE_OTHER): Payer: Medicare Other | Admitting: Vascular Surgery

## 2022-03-13 VITALS — BP 150/65 | HR 73 | Temp 98.2°F | Resp 20 | Ht 65.0 in | Wt 150.0 lb

## 2022-03-13 DIAGNOSIS — Z992 Dependence on renal dialysis: Secondary | ICD-10-CM

## 2022-03-13 DIAGNOSIS — N186 End stage renal disease: Secondary | ICD-10-CM

## 2022-03-19 NOTE — Progress Notes (Unsigned)
Cardiology Office Note:    Date:  03/20/2022   ID:  [name], DOB [birth year], MRN [id number]  PCP:  Cipriano Mile, NP   [facility] Providers Cardiologist:  Makennah Omura  }    Referring MD: Cipriano Mile, NP   Chief Complaint  Patient presents with   Atrial Fibrillation    Feb. 15, 2023   Gloria Gallagher is a 71 y.o. female with a hx of ESRD, aortic sclerosis, carotid artery disease, atrial fib She is s/p  successful cardioversion  Doing well from a cardiac standpoint Feeling well after cardioversion  Some DOE climbing stairs  Has been on dialysis > 10 years .   ECG today shows she is back in afib   November 16, 2021 Ellen is seen today for follow up of her atrial fib She had a successful cardioversion but went back into afib  We loaded her on amio  She has seen Lacie Draft ( [facility])   Nov. 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   Had revision of her dialysis fistula      Past Medical History:  Diagnosis Date   Anemia    Aortic atherosclerosis ([place])    Arthritis    Atrial fibrillation (HCC)    CAD (coronary artery disease)    COVID 2021   mild   Dialysis patient ([place])    ESRD on hemodialysis ([place])    MWF at [facility]   GERD (gastroesophageal reflux disease)    Gout    Heart murmur    Hyperparathyroidism ([place])    Hypertension    in the past, no longer takes medications   Neuromuscular disorder ([place])    neuropathy   Pneumonia    Seasonal allergies     Past Surgical History:  Procedure Laterality Date   CARDIOVERSION N/A 05/16/2021   Procedure: CARDIOVERSION;  Surgeon: Traveon Louro, Wonda Cheng, MD;  Location: [place];  Service: Cardiovascular;  Laterality: N/A;   CARDIOVERSION N/A 08/01/2021   Procedure: CARDIOVERSION;  Surgeon: Sanda Klein, MD;  Location: [place];  Service: Cardiovascular;  Laterality: N/A;   CARDIOVERSION N/A 12/05/2021    Procedure: CARDIOVERSION;  Surgeon: Berniece Salines, DO;  Location: [place];  Service: Cardiovascular;  Laterality: N/A;   DIALYSIS FISTULA CREATION Left    REVISON OF ARTERIOVENOUS FISTULA Left 02/16/2022   Procedure: LEFT ARM ARTERIOVENOUS FISTULA REVISION;  Surgeon: Cherre Robins, MD;  Location: [facility];  Service: Vascular;  Laterality: Left;  PERIPHERAL NERVE BLOCK   TUBAL LIGATION      Current Medications: Current Meds  Medication Sig   acetaminophen (TYLENOL) 500 MG tablet Take 2 tablets (1,000 mg total) by mouth every 6 (six) hours as needed for mild pain or moderate pain.   amiodarone (PACERONE) 200 MG tablet Take 1 tablet (200 mg total) by mouth daily.   atorvastatin (LIPITOR) 20 MG tablet Take 20 mg by mouth daily.   diclofenac Sodium (VOLTAREN) 1 % GEL Apply 4 g topically 4 (four) times daily.   Doxercalciferol (HECTOROL IV) Inject 1 Dose into the vein every Monday, Wednesday, and Friday with hemodialysis. During dialysis, 3X week   ELIQUIS 5 MG TABS tablet TAKE 1 TABLET BY MOUTH TWICE  DAILY   ferric citrate (AURYXIA) 1 GM 210 MG(Fe) tablet Take 1 tablet (210 mg total) by mouth 3 (three) times daily with meals.   oxyCODONE-acetaminophen (PERCOCET/ROXICET) 5-325 MG  tablet Take 1 tablet by mouth every 6 (six) hours as needed.     Allergies:   Penicillin g and Shrimp (diagnostic)   Social History   Socioeconomic History   Marital status: Single    Spouse name: Not on file   Number of children: 1   Years of education: Not on file   Highest education level: Not on file  Occupational History   Occupation: retired  Tobacco Use   Smoking status: Former    Types: Cigarettes    Passive exposure: Never   Smokeless tobacco: Never   Tobacco comments:    Former smoker 04/25/2021  Vaping Use   Vaping Use: Never used  Substance and Sexual Activity   Alcohol use: Yes    Alcohol/week: 1.0 standard drink of alcohol    Types: 1 Cans of beer per week    Comment: 1 beer every  other week 06/20/21   Drug use: Never   Sexual activity: Not Currently  Other Topics Concern   Not on file  Social History Narrative   Not on file   Social Determinants of Health   Financial Resource Strain: Not on file  Food Insecurity: No Food Insecurity (01/18/2022)   Hunger Vital Sign    Worried About Running Out of Food in the Last Year: Never true    Ran Out of Food in the Last Year: Never true  Transportation Needs: No Transportation Needs (01/18/2022)   PRAPARE - Hydrologist (Medical): No    Lack of Transportation (Non-Medical): No  Physical Activity: Not on file  Stress: Not on file  Social Connections: Not on file     Family History: The patient's family history includes Diabetes in her sister. There is no history of Stomach cancer or Colon cancer.  ROS:   Please see the history of present illness.     All other systems reviewed and are negative.  EKGs/Labs/Other Studies Reviewed:    The following studies were reviewed today:   EKG:     Recent Labs: 04/18/2021: B Natriuretic Peptide 464.9 01/17/2022: ALT 19; Magnesium 2.0 01/18/2022: TSH 1.660 01/19/2022: Platelets 161 02/16/2022: BUN 33; Creatinine, Ser 8.50; Hemoglobin 10.9; Potassium 3.5; Sodium 134  Recent Lipid Panel No results found for: "CHOL", "TRIG", "HDL", "CHOLHDL", "VLDL", "LDLCALC", "LDLDIRECT"   Risk Assessment/Calculations:    CHA2DS2-VASc Score = 3   This indicates a 3.2% annual risk of stroke. The patient's score is based upon: CHF History: 0 HTN History: 0 Diabetes History: 0 Stroke History: 0 Vascular Disease History: 1 (aortic atherosclerosis, carotid disease) Age Score: 1 Gender Score: 1       Physical Exam:    Physical Exam: Blood pressure (!) 135/48, pulse 60, height 5\' 5"  (1.651 m), weight 157 lb 3.2 oz (71.3 kg).       GEN:  generally weak appearing ,  chronically ill  in no acute distress HEENT: Normal NECK: No JVD; No carotid  bruits LYMPHATICS: No lymphadenopathy CARDIAC: RRR ,  1-6/1 systolic and diastolic murmur  RESPIRATORY:  Clear to auscultation without rales, wheezing or rhonchi  ABDOMEN: Soft, non-tender, non-distended MUSCULOSKELETAL:  No edema; No deformity  SKIN: Warm and dry NEUROLOGIC:  Alert and oriented x 3    ASSESSMENT:    1. Persistent atrial fibrillation ([place])   2. Primary hypertension    PLAN:       Atrial fib:  is in sinus rhythm today .  Continue amiodarone    Hypertension:  stable .  She has a wide pulse pressure because of her aortic insufficiency    3.  Aortic insufficiency: She has moderate to severe aortic insufficiency.  She has an ascending aortic aneurysm of 47 mm. Stable  She is a poor candidate for AVR .  I think she would have a difficult time getting through the surgery .  Will continue medical therapy .    4.  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    She is not a good candidate for any major surgical procedures.   Will continue to follow .   5.  ESRD :   has had several  difficult dialysis sessions.               Medication Adjustments/Labs and Tests Ordered: Current medicines are reviewed at length with the patient today.  Concerns regarding medicines are outlined above.  No orders of the defined types were placed in this encounter.  No orders of the defined types were placed in this encounter.   Patient Instructions  Medication Instructions:  Your physician recommends that you continue on your current medications as directed. Please refer to the Current Medication list given to you today.  *If you need a refill on your cardiac medications before your next appointment, please call your pharmacy*   Lab Work: NONE If you have labs (blood work) drawn today and your tests are completely normal, you will receive your results only by: [place] (if you have MyChart) OR A paper copy in the mail If you have  any lab test that is abnormal or we need to change your treatment, we will call you to review the results.   Testing/Procedures: NONE   Follow-Up: At [facility], you and your health needs are our priority.  As part of our continuing mission to provide you with exceptional heart care, we have created designated Provider Care Teams.  These Care Teams include your primary Cardiologist (physician) and Advanced Practice Providers (APPs -  Physician Assistants and Nurse Practitioners) who all work together to provide you with the care you need, when you need it.  We recommend signing up for the patient portal called "MyChart".  Sign up information is provided on this After Visit Summary.  MyChart is used to connect with patients for Virtual Visits (Telemedicine).  Patients are able to view lab/test results, encounter notes, upcoming appointments, etc.  Non-urgent messages can be sent to your provider as well.   To learn more about what you can do with MyChart, go to [email or web address].    Your next appointment:   6 month(s)  The format for your next appointment:   In Person  Provider:   Mertie Moores, MD       Important Information About Sugar         Signed, Mertie Moores, MD  03/20/2022 8:15 AM    [place]

## 2022-03-20 ENCOUNTER — Encounter: Payer: Self-pay | Admitting: Cardiovascular Disease

## 2022-03-20 ENCOUNTER — Ambulatory Visit: Payer: Medicare Other | Attending: Cardiovascular Disease | Admitting: Cardiovascular Disease

## 2022-03-20 VITALS — BP 135/48 | HR 60 | Ht 65.0 in | Wt 157.2 lb

## 2022-03-20 DIAGNOSIS — I10 Essential (primary) hypertension: Secondary | ICD-10-CM

## 2022-03-20 DIAGNOSIS — I4819 Other persistent atrial fibrillation: Secondary | ICD-10-CM | POA: Diagnosis not present

## 2022-03-20 NOTE — Patient Instructions (Signed)
Medication Instructions:  Your physician recommends that you continue on your current medications as directed. Please refer to the Current Medication list given to you today.  *If you need a refill on your cardiac medications before your next appointment, please call your pharmacy*   Lab Work: NONE If you have labs (blood work) drawn today and your tests are completely normal, you will receive your results only by: MyChart Message (if you have MyChart) OR A paper copy in the mail If you have any lab test that is abnormal or we need to change your treatment, we will call you to review the results.   Testing/Procedures: NONE   Follow-Up: At [facility], you and your health needs are our priority.  As part of our continuing mission to provide you with exceptional heart care, we have created designated Provider Care Teams.  These Care Teams include your primary Cardiologist (physician) and Advanced Practice Providers (APPs -  Physician Assistants and Nurse Practitioners) who all work together to provide you with the care you need, when you need it.  We recommend signing up for the patient portal called "MyChart".  Sign up information is provided on this After Visit Summary.  MyChart is used to connect with patients for Virtual Visits (Telemedicine).  Patients are able to view lab/test results, encounter notes, upcoming appointments, etc.  Non-urgent messages can be sent to your provider as well.   To learn more about what you can do with MyChart, go to [email or web address].    Your next appointment:   6 month(s)  The format for your next appointment:   In Person  Provider:   Philip Nahser, MD       Important Information About Sugar       

## 2022-05-10 NOTE — Telephone Encounter (Signed)
Appts have been scheduled.  

## 2022-06-22 ENCOUNTER — Other Ambulatory Visit (HOSPITAL_COMMUNITY): Payer: Self-pay | Admitting: Physician Assistant

## 2022-06-22 DIAGNOSIS — I4819 Other persistent atrial fibrillation: Secondary | ICD-10-CM

## 2022-06-22 NOTE — Telephone Encounter (Signed)
Prescription refill request for Eliquis received. Indication: Afib  Last office visit: 03/20/22 (Nahser)  Scr: 8.0 Age: 72  Weight: 71.3kg  Appropriate dose. Refill sent.

## 2022-07-13 ENCOUNTER — Telehealth: Payer: Self-pay | Admitting: Podiatry

## 2022-07-13 NOTE — Telephone Encounter (Signed)
Pt left message 2.29 @ 504pm to cxl appt for 3.5 and would like it r/s for a Tuesday or Thursday due to dialysis..  I r/s for 3.19. at  945 and left message for pt to call if this did not work. I will send a reminder in the mail as well.

## 2022-07-17 ENCOUNTER — Ambulatory Visit: Payer: 59 | Admitting: Podiatry

## 2022-07-31 ENCOUNTER — Ambulatory Visit: Payer: 59 | Admitting: Podiatry

## 2022-08-07 ENCOUNTER — Encounter: Payer: Self-pay | Admitting: Podiatry

## 2022-08-07 ENCOUNTER — Telehealth: Payer: Self-pay

## 2022-08-07 ENCOUNTER — Other Ambulatory Visit: Payer: Self-pay | Admitting: Podiatry

## 2022-08-07 ENCOUNTER — Ambulatory Visit (INDEPENDENT_AMBULATORY_CARE_PROVIDER_SITE_OTHER): Payer: 59 | Admitting: Podiatry

## 2022-08-07 DIAGNOSIS — M79674 Pain in right toe(s): Secondary | ICD-10-CM | POA: Diagnosis not present

## 2022-08-07 DIAGNOSIS — B351 Tinea unguium: Secondary | ICD-10-CM

## 2022-08-07 DIAGNOSIS — D689 Coagulation defect, unspecified: Secondary | ICD-10-CM

## 2022-08-07 DIAGNOSIS — M79675 Pain in left toe(s): Secondary | ICD-10-CM

## 2022-08-07 MED ORDER — UREA NAIL 45 % EX GEL: CUTANEOUS | 3 refills | Status: DC

## 2022-08-07 NOTE — Progress Notes (Signed)
  Subjective:  Patient ID: Gloria Gallagher, female    DOB: 1950-08-17,   MRN: [id number]  Chief Complaint  Patient presents with   Toe Pain    Hallux bilateral - thick toenails, tender around the corners, callused area medial hallux left, trouble with pressure of shoes-makes uncomfortable    New Patient (Initial Visit)    Est pt 2022    72 y.o. female presents for concern of thickened elongated and painful nails that are difficult to trim. Requesting to have them trimmed today. Denies burning and tingling in their feet. Patient is on eliquis and at risk for nail care.   PCP:  Cipriano Mile, NP    . Denies any other pedal complaints. Denies n/v/f/c.   Past Medical History:  Diagnosis Date   Anemia    Aortic atherosclerosis (HCC)    Arthritis    Atrial fibrillation (HCC)    CAD (coronary artery disease)    COVID 2021   mild   Dialysis patient ([place])    ESRD on hemodialysis ([place])    MWF at [facility]   GERD (gastroesophageal reflux disease)    Gout    Heart murmur    Hyperparathyroidism ([place])    Hypertension    in the past, no longer takes medications   Neuromuscular disorder (HCC)    neuropathy   Pneumonia    Seasonal allergies     Objective:  Physical Exam: Vascular: DP/PT pulses 2/4 bilateral. CFT <3 seconds. Absent hair growth on digits. Edema noted to bilateral lower extremities. Xerosis noted bilaterally.  Skin. No lacerations or abrasions bilateral feet. Nails 1-5 bilateral  are thickened discolored and elongated with subungual debris.  Musculoskeletal: MMT 5/5 bilateral lower extremities in DF, PF, Inversion and Eversion. Deceased ROM in DF of ankle joint.  Neurological: Sensation intact to light touch. Protective sensation diminished bilateral.    Assessment:   1. Pain due to onychomycosis of toenails of both feet   2. Coagulation defect, unspecified ([place])      Plan:  Patient was evaluated and treated and all questions answered. -Mechanically debrided all  nails 1-5 bilateral using sterile nail nipper and filed with dremel without incident  -Answered all patient questions -Patient to return  in 3 months for at risk foot care -Patient advised to call the office if any problems or questions arise in the meantime.   Lorenda Peck, DPM

## 2022-08-07 NOTE — Telephone Encounter (Signed)
I will send in a prescription but it maybe more expensive that way so they can check. [employer] usually has it for [employer]

## 2022-08-08 ENCOUNTER — Telehealth: Payer: Self-pay

## 2022-08-08 ENCOUNTER — Other Ambulatory Visit: Payer: Self-pay | Admitting: Podiatry

## 2022-08-08 MED ORDER — UREA NAIL 45 % EX GEL: CUTANEOUS | 3 refills | Status: DC

## 2022-08-09 NOTE — Telephone Encounter (Signed)
There is not a good alternative medication for this one. This is usually OTC and most patients have had luck finding it on [place]

## 2022-08-14 ENCOUNTER — Telehealth: Payer: Self-pay | Admitting: *Deleted

## 2022-08-14 NOTE — Telephone Encounter (Signed)
Patient is calling about her the urea , thought the physician was sending something else in since pharmacy said that it was out of stock, explained that the medication per physician can be purchase thru [facility], she verbalized understanding.

## 2022-09-25 ENCOUNTER — Encounter (HOSPITAL_COMMUNITY): Payer: Self-pay

## 2022-09-25 ENCOUNTER — Other Ambulatory Visit: Payer: Self-pay

## 2022-09-25 ENCOUNTER — Emergency Department (HOSPITAL_COMMUNITY)
Admission: EM | Admit: 2022-09-25 | Discharge: 2022-09-25 | Disposition: A | Discharge status: Home / Self Care | Payer: 59 | Attending: Emergency Medicine | Admitting: Emergency Medicine

## 2022-09-25 ENCOUNTER — Emergency Department (HOSPITAL_COMMUNITY): Payer: 59

## 2022-09-25 DIAGNOSIS — Z79899 Other long term (current) drug therapy: Secondary | ICD-10-CM | POA: Insufficient documentation

## 2022-09-25 DIAGNOSIS — Z7901 Long term (current) use of anticoagulants: Secondary | ICD-10-CM | POA: Diagnosis not present

## 2022-09-25 DIAGNOSIS — R079 Chest pain, unspecified: Secondary | ICD-10-CM | POA: Diagnosis present

## 2022-09-25 DIAGNOSIS — I2510 Atherosclerotic heart disease of native coronary artery without angina pectoris: Secondary | ICD-10-CM | POA: Insufficient documentation

## 2022-09-25 DIAGNOSIS — I120 Hypertensive chronic kidney disease with stage 5 chronic kidney disease or end stage renal disease: Secondary | ICD-10-CM | POA: Insufficient documentation

## 2022-09-25 DIAGNOSIS — Z8616 Personal history of COVID-19: Secondary | ICD-10-CM | POA: Diagnosis not present

## 2022-09-25 DIAGNOSIS — Z87891 Personal history of nicotine dependence: Secondary | ICD-10-CM | POA: Diagnosis not present

## 2022-09-25 DIAGNOSIS — N186 End stage renal disease: Secondary | ICD-10-CM | POA: Diagnosis not present

## 2022-09-25 DIAGNOSIS — R0609 Other forms of dyspnea: Secondary | ICD-10-CM | POA: Diagnosis not present

## 2022-09-25 DIAGNOSIS — Z992 Dependence on renal dialysis: Secondary | ICD-10-CM | POA: Diagnosis not present

## 2022-09-25 LAB — CBC WITH DIFFERENTIAL/PLATELET
Abs Immature Granulocytes: 0.04 10*3/uL (ref 0.00–0.07)
Basophils Absolute: 0 10*3/uL (ref 0.0–0.1)
Basophils Relative: 0 %
Eosinophils Absolute: 0 10*3/uL (ref 0.0–0.5)
Eosinophils Relative: 0 %
HCT: 37.8 % (ref 36.0–46.0)
Hemoglobin: 11.8 g/dL — ABNORMAL LOW (ref 12.0–15.0)
Immature Granulocytes: 1 %
Lymphocytes Relative: 9 %
Lymphs Abs: 0.6 10*3/uL — ABNORMAL LOW (ref 0.7–4.0)
MCH: 29.5 pg (ref 26.0–34.0)
MCHC: 31.2 g/dL (ref 30.0–36.0)
MCV: 94.5 fL (ref 80.0–100.0)
Monocytes Absolute: 0.6 10*3/uL (ref 0.1–1.0)
Monocytes Relative: 10 %
Neutro Abs: 4.8 10*3/uL (ref 1.7–7.7)
Neutrophils Relative %: 80 %
Platelets: 149 10*3/uL — ABNORMAL LOW (ref 150–400)
RBC: 4 MIL/uL (ref 3.87–5.11)
RDW: 14.7 % (ref 11.5–15.5)
WBC: 6 10*3/uL (ref 4.0–10.5)
nRBC: 0 % (ref 0.0–0.2)

## 2022-09-25 LAB — BASIC METABOLIC PANEL
Anion gap: 15 (ref 5–15)
BUN: 35 mg/dL — ABNORMAL HIGH (ref 8–23)
CO2: 29 mmol/L (ref 22–32)
Calcium: 8 mg/dL — ABNORMAL LOW (ref 8.9–10.3)
Chloride: 91 mmol/L — ABNORMAL LOW (ref 98–111)
Creatinine, Ser: 6.4 mg/dL — ABNORMAL HIGH (ref 0.44–1.00)
GFR, Estimated: 6 mL/min — ABNORMAL LOW (ref >60)
Glucose, Bld: 113 mg/dL — ABNORMAL HIGH (ref 70–99)
Potassium: 3.8 mmol/L (ref 3.5–5.1)
Sodium: 135 mmol/L (ref 135–145)

## 2022-09-25 LAB — TROPONIN I (HIGH SENSITIVITY)
Troponin I (High Sensitivity): 39 ng/L — ABNORMAL HIGH (ref <18)
Troponin I (High Sensitivity): 37 ng/L — ABNORMAL HIGH (ref <18)

## 2022-09-25 LAB — BRAIN NATRIURETIC PEPTIDE: B Natriuretic Peptide: 848.7 pg/mL — ABNORMAL HIGH (ref 0.0–100.0)

## 2022-09-25 MED ORDER — IOHEXOL 350 MG/ML SOLN
75.0000 mL | Freq: Once | INTRAVENOUS | Status: AC | PRN
  Administered 2022-09-25: 75 mL via INTRAVENOUS

## 2022-09-25 MED ORDER — KETOROLAC TROMETHAMINE 15 MG/ML IJ SOLN
15.0000 mg | Freq: Once | INTRAMUSCULAR | Status: AC
  Administered 2022-09-25: 15 mg via INTRAVENOUS
  Filled 2022-09-25: qty 1

## 2022-09-25 MED ORDER — ACETAMINOPHEN 500 MG PO TABS
1000.0000 mg | ORAL_TABLET | Freq: Once | ORAL | Status: AC
  Administered 2022-09-25: 1000 mg via ORAL
  Filled 2022-09-25: qty 2

## 2022-09-25 NOTE — ED Provider Notes (Signed)
[facility] AT [facility] Provider Note  CSN: [id number] Arrival date & time: 09/25/22 0250  Chief Complaint(s) Chest Pain (Pt arrived via ems from home c/o chest pain with breathing and movement dialysis MWF )  HPI Gloria Gallagher is a 72 y.o. female with past medical history as below, significant for A-fib, CAD, ESRD Monday Wednesday Friday, HTN, aortic atherosclerosis who presents to the ED with complaint of left-sided chest pain, exertional dyspnea and orthopnea, myalgias.  No missed HD sessions in the past year per the patient last dialysis was yesterday and received full run.  Began having shortness of breath, cramping to her chest area and left-sided back pain over the past 48 hours.  No productive cough, no nausea or vomiting, no fevers or chills.  She is compliant with home medications.  No sick contacts or illicit drug use.  Does not make urine, no change to bowel activity.  No medications prior to arrival.  Primary concern is that she cannot lay flat on her left side and sleep due to the arthralgias and dyspnea.  No home oxygen use, not hypoxic per EMS  Past Medical History Past Medical History:  Diagnosis Date   Anemia    Aortic atherosclerosis (HCC)    Arthritis    Atrial fibrillation (HCC)    CAD (coronary artery disease)    COVID 2021   mild   Dialysis patient (HCC)    ESRD on hemodialysis (HCC)    MWF at [facility]   GERD (gastroesophageal reflux disease)    Gout    Heart murmur    Hyperparathyroidism (HCC)    Hypertension    in the past, no longer takes medications   Neuromuscular disorder (HCC)    neuropathy   Pneumonia    Seasonal allergies    Patient Active Problem List   Diagnosis Date Noted   HTN (hypertension) 03/20/2022   Fatigue fracture of vertebra, cervical region, initial encounter for fracture 01/23/2022   AMS (altered mental status) 01/17/2022   Odontoid fracture (HCC) 01/17/2022   ESRD (end stage renal disease) on  dialysis (HCC) 01/17/2022   Pruritus, unspecified 12/13/2021   Hepatic lesion 09/19/2021   Screening for colorectal cancer 09/19/2021   Persistent atrial fibrillation (HCC) 04/25/2021   Secondary hypercoagulable state (HCC) 04/25/2021   Dependence on renal dialysis (HCC) 02/11/2020   Fluid overload, unspecified 01/12/2020   Other disorders of plasma-protein metabolism, not elsewhere classified 11/18/2019   Allergy, unspecified, initial encounter 11/13/2019   Anaphylactic shock, unspecified, initial encounter 11/13/2019   Pain, unspecified 08/27/2019   Secondary hyperparathyroidism of renal origin (HCC) 05/09/2019   Anemia in chronic kidney disease 03/31/2019   Carcinoma in situ of cervix, unspecified 03/31/2019   Coagulation defect, unspecified (HCC) 03/31/2019   Disorder of thyroid, unspecified 03/31/2019   Functional dyspepsia 03/31/2019   Gout, unspecified 03/31/2019   Iron deficiency anemia, unspecified 03/31/2019   Other disorders of electrolyte and fluid balance, not elsewhere classified 03/31/2019   Other disorders of phosphorus metabolism 03/31/2019   Polycystic kidney, adult type 03/31/2019   Renal osteodystrophy 03/31/2019   Shortness of breath 03/31/2019   Home Medication(s) Prior to Admission medications   Medication Sig Start Date End Date Taking? Authorizing Provider  acetaminophen (TYLENOL) 500 MG tablet Take 2 tablets (1,000 mg total) by mouth every 6 (six) hours as needed for mild pain or moderate pain. 01/19/22   Alfredo Martinez, MD  amiodarone (PACERONE) 200 MG tablet Take 1 tablet (200 mg total)  by mouth daily. 10/19/21   Fenton, Clint R, PA  apixaban (ELIQUIS) 5 MG TABS tablet TAKE 1 TABLET BY MOUTH TWICE A DAY 06/22/22   Nahser, Deloris Ping, MD  atorvastatin (LIPITOR) 20 MG tablet Take 20 mg by mouth daily. 12/05/21   [provider]  diclofenac Sodium (VOLTAREN) 1 % GEL Apply 4 g topically 4 (four) times daily. 01/14/22   Melene Plan, DO  Doxercalciferol (HECTOROL  IV) Inject 1 Dose into the vein every Monday, Wednesday, and Friday with hemodialysis. During dialysis, 3X week 11/08/21 11/07/22  [provider]  ferric citrate (AURYXIA) 1 GM 210 MG(Fe) tablet Take 1 tablet (210 mg total) by mouth 3 (three) times daily with meals. 01/19/22   Alfredo Martinez, MD  oxyCODONE-acetaminophen (PERCOCET/ROXICET) 5-325 MG tablet Take 1 tablet by mouth every 6 (six) hours as needed. 02/16/22   Lars Mage, PA-C  Urea (UREA NAIL) 45 % GEL Apply to affected nail daily 08/08/22   Louann Sjogren, DPM                                                                                                                                    Past Surgical History Past Surgical History:  Procedure Laterality Date   CARDIOVERSION N/A 05/16/2021   Procedure: CARDIOVERSION;  Surgeon: Nahser, Deloris Ping, MD;  Location: [facility] ENDOSCOPY;  Service: Cardiovascular;  Laterality: N/A;   CARDIOVERSION N/A 08/01/2021   Procedure: CARDIOVERSION;  Surgeon: Thurmon Fair, MD;  Location: [facility] ENDOSCOPY;  Service: Cardiovascular;  Laterality: N/A;   CARDIOVERSION N/A 12/05/2021   Procedure: CARDIOVERSION;  Surgeon: Thomasene Ripple, DO;  Location: [facility] ENDOSCOPY;  Service: Cardiovascular;  Laterality: N/A;   DIALYSIS FISTULA CREATION Left    REVISON OF ARTERIOVENOUS FISTULA Left 02/16/2022   Procedure: LEFT ARM ARTERIOVENOUS FISTULA REVISION;  Surgeon: Leonie Douglas, MD;  Location: [facility] OR;  Service: Vascular;  Laterality: Left;  PERIPHERAL NERVE BLOCK   TUBAL LIGATION     Family History Family History  Problem Relation Age of Onset   Diabetes Sister    Stomach cancer Neg Hx    Colon cancer Neg Hx     Social History Social History   Tobacco Use   Smoking status: Former    Types: Cigarettes    Passive exposure: Never   Smokeless tobacco: Never   Tobacco comments:    Former smoker 04/25/2021  Vaping Use   Vaping Use: Never used  Substance Use Topics   Alcohol use: Yes    Alcohol/week: 1.0  standard drink of alcohol    Types: 1 Cans of beer per week    Comment: 1 beer every other week 06/20/21   Drug use: Never   Allergies Penicillin g and Shrimp (diagnostic)  Review of Systems Review of Systems  Constitutional:  Negative for activity change and fever.  HENT:  Negative for facial swelling and trouble swallowing.   Eyes:  Negative for discharge  and redness.  Respiratory:  Positive for chest tightness and shortness of breath. Negative for cough.   Cardiovascular:  Negative for chest pain and palpitations.  Gastrointestinal:  Negative for abdominal pain and nausea.  Genitourinary:  Negative for dysuria and flank pain.  Musculoskeletal:  Positive for arthralgias. Negative for back pain and gait problem.  Skin:  Negative for pallor and rash.  Neurological:  Negative for syncope and headaches.    Physical Exam Vital Signs  I have reviewed the triage vital signs BP 122/65   Pulse 84   Temp 97.8 F (36.6 C) (Oral)   Resp 20   Ht 5\' 5"  (1.651 m)   Wt 71 kg   SpO2 95%   BMI 26.05 kg/m  Physical Exam Vitals and nursing note reviewed.  Constitutional:      General: She is not in acute distress.    Appearance: Normal appearance. She is not ill-appearing.  HENT:     Head: Normocephalic and atraumatic.     Right Ear: External ear normal.     Left Ear: External ear normal.     Nose: Nose normal.     Mouth/Throat:     Mouth: Mucous membranes are moist.  Eyes:     General: No scleral icterus.       Right eye: No discharge.        Left eye: No discharge.  Cardiovascular:     Rate and Rhythm: Normal rate and regular rhythm.     Pulses: Normal pulses.     Heart sounds: Normal heart sounds.  Pulmonary:     Effort: Pulmonary effort is normal. No accessory muscle usage or respiratory distress.     Breath sounds: Decreased breath sounds present.  Abdominal:     General: Abdomen is flat.     Tenderness: There is no abdominal tenderness.  Musculoskeletal:         General: Normal range of motion.     Right lower leg: No edema.     Left lower leg: No edema.  Skin:    General: Skin is warm and dry.     Capillary Refill: Capillary refill takes less than 2 seconds.       Neurological:     Mental Status: She is alert.  Psychiatric:        Mood and Affect: Mood normal.        Behavior: Behavior normal.     ED Results and Treatments Labs (all labs ordered are listed, but only abnormal results are displayed) Labs Reviewed  BASIC METABOLIC PANEL - Abnormal; Notable for the following components:      Result Value   Chloride 91 (*)    Glucose, Bld 113 (*)    BUN 35 (*)    Creatinine, Ser 6.40 (*)    Calcium 8.0 (*)    GFR, Estimated 6 (*)    All other components within normal limits  CBC WITH DIFFERENTIAL/PLATELET - Abnormal; Notable for the following components:   Hemoglobin 11.8 (*)    Platelets 149 (*)    Lymphs Abs 0.6 (*)    All other components within normal limits  BRAIN NATRIURETIC PEPTIDE - Abnormal; Notable for the following components:   B Natriuretic Peptide 848.7 (*)    All other components within normal limits  TROPONIN I (HIGH SENSITIVITY) - Abnormal; Notable for the following components:   Troponin I (High Sensitivity) 39 (*)    All other components within normal limits  TROPONIN I (HIGH SENSITIVITY) -  Abnormal; Notable for the following components:   Troponin I (High Sensitivity) 37 (*)    All other components within normal limits                                                                                                                          Radiology DG Chest Port 1 View  Result Date: 09/25/2022 CLINICAL DATA:  Chest pain especially when lying recumbent. EXAM: PORTABLE CHEST 1 VIEW COMPARISON:  Portable chest 01/14/2022 FINDINGS: There is basal predominant subpleural fibrosis of the lungs with no appreciable new infiltrates. No pleural effusion. The aorta is tortuous and ectatic with patchy calcifications, stable  mediastinal configuration. The heart is moderately enlarged but the central vessels are normal caliber. No acute edema is seen. There are degenerative changes and mild dextroscoliosis of the thoracic spine. Osteopenia. Compare: There previously was vascular engorgement and mild interstitial edema which are not seen today. IMPRESSION: 1. No acute radiographic chest findings. 2. Stable cardiomegaly. 3. Stable basal predominant subpleural fibrosis. 4. Aortic atherosclerosis and uncoiling. 5. Osteopenia and degenerative changes. Electronically Signed   By: Almira Bar M.D.   On: 09/25/2022 03:33    Pertinent labs & imaging results that were available during my care of the patient were reviewed by me and considered in my medical decision making (see MDM for details).  Medications Ordered in ED Medications  ketorolac (TORADOL) 15 MG/ML injection 15 mg (15 mg Intravenous Given 09/25/22 0516)  acetaminophen (TYLENOL) tablet 1,000 mg (1,000 mg Oral Given 09/25/22 0516)                                                                                                                                     Procedures Procedures  (including critical care time)  Medical Decision Making / ED Course    Medical Decision Making:    Gloria Gallagher is a 71 y.o. female with past medical history as below, significant for A-fib, CAD, ESRD Monday Wednesday Friday, HTN, aortic atherosclerosis who presents to the ED with complaint of left-sided chest pain, exertional dyspnea and orthopnea, myalgias. The complaint involves an extensive differential diagnosis and also carries with it a high risk of complications and morbidity.  Serious etiology was considered. Ddx includes but is not limited to: In my evaluation of this patient's dyspnea my DDx includes, but is not limited to, pneumonia, pulmonary embolism, pneumothorax, pulmonary edema,  metabolic acidosis, asthma, COPD, cardiac cause, anemia, anxiety, etc.  Differential  includes all life-threatening causes for chest pain. This includes but is not exclusive to acute coronary syndrome, aortic dissection, pulmonary embolism, cardiac tamponade, community-acquired pneumonia, pericarditis, musculoskeletal chest wall pain, etc.   Complete initial physical exam performed, notably the patient  was no hypoxia, no conversational dyspnea, well-appearing, HDS.    Reviewed and confirmed nursing documentation for past medical history, family history, social history.  Vital signs reviewed.    Clinical Course as of 09/25/22 0721  Tue Sep 25, 2022  0506 Troponin I (High Sensitivity)(!): 39 Similar to baseline, ESRD on HD [SG]  0506 B Natriuretic Peptide(!): 848.7 ESRD on HD, no sig LE edema, she has vascular congestion on XR but appears chronic  [SG]  0506 Potassium: 3.8 [SG]  0506 Creatinine(!): 6.40 Last HD was yestd [SG]  0634 Feeling somewhat better [SG]    Clinical Course User Index [SG] Sloan Leiter, DO   Labs reviewed, she is feeling better after intervention, still having some mild exertional dib and chest pain. Will get CTPE to better evaluate pulm pathology given abnormalities noted on CXR  Signed out to incoming EDP pending CT and recheck        Additional history obtained: -Additional history obtained from na -External records from outside source obtained and reviewed including: Chart review including previous notes, labs, imaging, consultation notes including prior admission, prior ED visits, prior labs and imaging, home medications   Lab Tests: -I ordered, reviewed, and interpreted labs.   The pertinent results include:   Labs Reviewed  BASIC METABOLIC PANEL - Abnormal; Notable for the following components:      Result Value   Chloride 91 (*)    Glucose, Bld 113 (*)    BUN 35 (*)    Creatinine, Ser 6.40 (*)    Calcium 8.0 (*)    GFR, Estimated 6 (*)    All other components within normal limits  CBC WITH DIFFERENTIAL/PLATELET -  Abnormal; Notable for the following components:   Hemoglobin 11.8 (*)    Platelets 149 (*)    Lymphs Abs 0.6 (*)    All other components within normal limits  BRAIN NATRIURETIC PEPTIDE - Abnormal; Notable for the following components:   B Natriuretic Peptide 848.7 (*)    All other components within normal limits  TROPONIN I (HIGH SENSITIVITY) - Abnormal; Notable for the following components:   Troponin I (High Sensitivity) 39 (*)    All other components within normal limits  TROPONIN I (HIGH SENSITIVITY) - Abnormal; Notable for the following components:   Troponin I (High Sensitivity) 37 (*)    All other components within normal limits    Notable for as above  EKG   EKG Interpretation  Date/Time:  Tuesday Sep 25 2022 03:00:22 EDT Ventricular Rate:  87 PR Interval:    QRS Duration: 113 QT Interval:  420 QTC Calculation: 506 R Axis:   -50 Text Interpretation: Atrial fibrillation Left anterior fascicular block Left ventricular hypertrophy Prolonged QT interval similar to prev no stemi Confirmed by Tanda Rockers ([id number]) on 09/25/2022 5:27:30 AM         Imaging Studies ordered: I ordered imaging studies including CXR, CTPE I independently visualized the following imaging with scope of interpretation limited to determining acute life threatening conditions related to emergency care; findings noted above, significant for cxr w chronic changes, ct pending I independently visualized and interpreted imaging. I agree with the radiologist interpretation  Medicines ordered and prescription drug management: Meds ordered this encounter  Medications   ketorolac (TORADOL) 15 MG/ML injection 15 mg   acetaminophen (TYLENOL) tablet 1,000 mg    -I have reviewed the patients home medicines and have made adjustments as needed   Consultations Obtained: na   Cardiac Monitoring: The patient was maintained on a cardiac monitor.  I personally viewed and interpreted the cardiac monitored which  showed an underlying rhythm of: afib (known hx of this)  Social Determinants of Health:  Diagnosis or treatment significantly limited by social determinants of health: former smoker   Reevaluation: After the interventions noted above, I reevaluated the patient and found that they have improved  Co morbidities that complicate the patient evaluation  Past Medical History:  Diagnosis Date   Anemia    Aortic atherosclerosis (HCC)    Arthritis    Atrial fibrillation (HCC)    CAD (coronary artery disease)    COVID 2021   mild   Dialysis patient (HCC)    ESRD on hemodialysis (HCC)    MWF at [facility]   GERD (gastroesophageal reflux disease)    Gout    Heart murmur    Hyperparathyroidism (HCC)    Hypertension    in the past, no longer takes medications   Neuromuscular disorder (HCC)    neuropathy   Pneumonia    Seasonal allergies       Dispostion: Disposition decision including need for hospitalization was considered, and patient disposition pending at time of sign out.    Final Clinical Impression(s) / ED Diagnoses Final diagnoses:  Exertional dyspnea  Chest pain, unspecified type  ESRD on hemodialysis [facility])     This chart was dictated using voice recognition software.  Despite best efforts to proofread,  errors can occur which can change the documentation meaning.    Sloan Leiter, DO 09/25/22 [phone]

## 2022-09-25 NOTE — ED Triage Notes (Signed)
Pt c/o cp with movement and breathing dialysis MWF

## 2022-09-25 NOTE — ED Provider Notes (Addendum)
  Physical Exam  BP 122/65   Pulse 84   Temp 97.8 F (36.6 C) (Oral)   Resp 20   Ht 5\' 5"  (1.651 m)   Wt 71 kg   SpO2 95%   BMI 26.05 kg/m   Physical Exam  Procedures  Procedures  ED Course / MDM   Clinical Course as of 09/25/22 0703  Tue Sep 25, 2022  0506 Troponin I (High Sensitivity)(!): 39 Similar to baseline, ESRD on HD [SG]  0506 B Natriuretic Peptide(!): 848.7 ESRD on HD, no sig LE edema, she has vascular congestion on XR but appears chronic  [SG]  0506 Potassium: 3.8 [SG]  0506 Creatinine(!): 6.40 Last HD was yestd [SG]  0634 Feeling somewhat better [SG]    Clinical Course User Index [SG] Sloan Leiter, DO   Medical Decision Making Amount and/or Complexity of Data Reviewed Labs: ordered. Decision-making details documented in ED Course. Radiology: ordered.  Risk OTC drugs. Prescription drug management.   59F, ESRD on HD MWF, presenting with CP at dialysis yesterday, persists today, L sided chest wall pain with exertional dyspnea. CTA PE pending. Toradol helped pain, chest wall TTP.  Labs: Troponins elevated but flat and downtrending at 39 and 37, BNP nonspecifically elevated at 849, BMP with an elevated serum creatinine to 6.4, BUN 35, no hyperkalemia with a potassium of 3.8, CBC without a leukocytosis, mild anemia to 11.8.  CTA PE:  IMPRESSION:  There is no evidence of pulmonary artery embolism. There is marked  ectasia of the main pulmonary artery suggesting pulmonary arterial  hypertension. There is reflux of contrast into hepatic veins  suggesting tricuspid incompetence.    Marked cardiomegaly. Coronary artery disease. Small left pleural  effusion. Small linear patchy densities in left lower lung field may  suggest scarring or subsegmental atelectasis/pneumonia.    Aortic atherosclerosis.  Hepatic cysts.  Polycystic renal disease.    Patient on reassessment was feeling symptomatically improved.  Had reproducible chest wall tenderness,  recommended Tylenol and lidocaine patch, follow-up for outpatient dialysis per her regular schedule.  Labs reassuring without evidence of ACS or need for emergent dialysis.  Imaging also reassuring, some evidence of pulmonary hypertension but does not have an oxygen requirement..  Overall stable for discharge.     Ernie Avena, MD 09/25/22 1033    Ernie Avena, MD 09/25/22 [phone]

## 2022-09-25 NOTE — Discharge Instructions (Signed)
It was a pleasure caring for you today in the emergency department. ° °Please return to the emergency department for any worsening or worrisome symptoms. ° ° °

## 2022-10-01 ENCOUNTER — Encounter (HOSPITAL_COMMUNITY): Payer: Self-pay

## 2022-10-01 ENCOUNTER — Emergency Department (HOSPITAL_COMMUNITY): Payer: 59

## 2022-10-01 ENCOUNTER — Inpatient Hospital Stay (HOSPITAL_COMMUNITY): Payer: 59

## 2022-10-01 ENCOUNTER — Inpatient Hospital Stay (HOSPITAL_COMMUNITY)
Admission: EM | Admit: 2022-10-01 | Discharge: 2022-10-06 | DRG: 314 | Disposition: A | Discharge status: Home Health | Payer: 59 | Attending: Family Medicine | Admitting: Family Medicine

## 2022-10-01 ENCOUNTER — Other Ambulatory Visit: Payer: Self-pay

## 2022-10-01 DIAGNOSIS — R579 Shock, unspecified: Secondary | ICD-10-CM

## 2022-10-01 DIAGNOSIS — R0602 Shortness of breath: Principal | ICD-10-CM

## 2022-10-01 DIAGNOSIS — I77810 Thoracic aortic ectasia: Secondary | ICD-10-CM | POA: Diagnosis present

## 2022-10-01 DIAGNOSIS — I252 Old myocardial infarction: Secondary | ICD-10-CM

## 2022-10-01 DIAGNOSIS — N2581 Secondary hyperparathyroidism of renal origin: Secondary | ICD-10-CM | POA: Diagnosis present

## 2022-10-01 DIAGNOSIS — I3139 Other pericardial effusion (noninflammatory): Secondary | ICD-10-CM | POA: Diagnosis present

## 2022-10-01 DIAGNOSIS — R911 Solitary pulmonary nodule: Secondary | ICD-10-CM | POA: Diagnosis present

## 2022-10-01 DIAGNOSIS — I352 Nonrheumatic aortic (valve) stenosis with insufficiency: Secondary | ICD-10-CM | POA: Diagnosis present

## 2022-10-01 DIAGNOSIS — E440 Moderate protein-calorie malnutrition: Secondary | ICD-10-CM | POA: Insufficient documentation

## 2022-10-01 DIAGNOSIS — Z79899 Other long term (current) drug therapy: Secondary | ICD-10-CM

## 2022-10-01 DIAGNOSIS — E8809 Other disorders of plasma-protein metabolism, not elsewhere classified: Secondary | ICD-10-CM | POA: Diagnosis present

## 2022-10-01 DIAGNOSIS — I2722 Pulmonary hypertension due to left heart disease: Secondary | ICD-10-CM | POA: Diagnosis present

## 2022-10-01 DIAGNOSIS — Z91013 Allergy to seafood: Secondary | ICD-10-CM

## 2022-10-01 DIAGNOSIS — I4819 Other persistent atrial fibrillation: Secondary | ICD-10-CM | POA: Diagnosis present

## 2022-10-01 DIAGNOSIS — I314 Cardiac tamponade: Secondary | ICD-10-CM | POA: Diagnosis present

## 2022-10-01 DIAGNOSIS — Z8616 Personal history of COVID-19: Secondary | ICD-10-CM | POA: Diagnosis not present

## 2022-10-01 DIAGNOSIS — Q613 Polycystic kidney, unspecified: Secondary | ICD-10-CM | POA: Diagnosis not present

## 2022-10-01 DIAGNOSIS — E871 Hypo-osmolality and hyponatremia: Secondary | ICD-10-CM | POA: Diagnosis present

## 2022-10-01 DIAGNOSIS — R578 Other shock: Secondary | ICD-10-CM | POA: Diagnosis present

## 2022-10-01 DIAGNOSIS — N186 End stage renal disease: Secondary | ICD-10-CM | POA: Diagnosis present

## 2022-10-01 DIAGNOSIS — Z7901 Long term (current) use of anticoagulants: Secondary | ICD-10-CM

## 2022-10-01 DIAGNOSIS — J9600 Acute respiratory failure, unspecified whether with hypoxia or hypercapnia: Secondary | ICD-10-CM | POA: Diagnosis present

## 2022-10-01 DIAGNOSIS — K769 Liver disease, unspecified: Secondary | ICD-10-CM | POA: Diagnosis present

## 2022-10-01 DIAGNOSIS — J9602 Acute respiratory failure with hypercapnia: Secondary | ICD-10-CM | POA: Diagnosis not present

## 2022-10-01 DIAGNOSIS — E876 Hypokalemia: Secondary | ICD-10-CM | POA: Diagnosis not present

## 2022-10-01 DIAGNOSIS — Z880 Allergy status to penicillin: Secondary | ICD-10-CM

## 2022-10-01 DIAGNOSIS — Z87891 Personal history of nicotine dependence: Secondary | ICD-10-CM

## 2022-10-01 DIAGNOSIS — H5789 Other specified disorders of eye and adnexa: Secondary | ICD-10-CM | POA: Diagnosis not present

## 2022-10-01 DIAGNOSIS — Z992 Dependence on renal dialysis: Secondary | ICD-10-CM

## 2022-10-01 DIAGNOSIS — R110 Nausea: Secondary | ICD-10-CM | POA: Diagnosis present

## 2022-10-01 DIAGNOSIS — I120 Hypertensive chronic kidney disease with stage 5 chronic kidney disease or end stage renal disease: Secondary | ICD-10-CM | POA: Diagnosis present

## 2022-10-01 DIAGNOSIS — I953 Hypotension of hemodialysis: Secondary | ICD-10-CM | POA: Diagnosis present

## 2022-10-01 DIAGNOSIS — I2723 Pulmonary hypertension due to lung diseases and hypoxia: Secondary | ICD-10-CM | POA: Diagnosis present

## 2022-10-01 DIAGNOSIS — J9601 Acute respiratory failure with hypoxia: Secondary | ICD-10-CM

## 2022-10-01 DIAGNOSIS — D631 Anemia in chronic kidney disease: Secondary | ICD-10-CM | POA: Diagnosis present

## 2022-10-01 DIAGNOSIS — K219 Gastro-esophageal reflux disease without esophagitis: Secondary | ICD-10-CM | POA: Diagnosis present

## 2022-10-01 DIAGNOSIS — Q446 Cystic disease of liver: Secondary | ICD-10-CM | POA: Diagnosis not present

## 2022-10-01 DIAGNOSIS — Z1152 Encounter for screening for COVID-19: Secondary | ICD-10-CM

## 2022-10-01 DIAGNOSIS — E785 Hyperlipidemia, unspecified: Secondary | ICD-10-CM | POA: Diagnosis present

## 2022-10-01 DIAGNOSIS — Z90710 Acquired absence of both cervix and uterus: Secondary | ICD-10-CM

## 2022-10-01 DIAGNOSIS — Z6823 Body mass index (BMI) 23.0-23.9, adult: Secondary | ICD-10-CM

## 2022-10-01 DIAGNOSIS — Z833 Family history of diabetes mellitus: Secondary | ICD-10-CM

## 2022-10-01 DIAGNOSIS — I2510 Atherosclerotic heart disease of native coronary artery without angina pectoris: Secondary | ICD-10-CM | POA: Diagnosis present

## 2022-10-01 DIAGNOSIS — I2781 Cor pulmonale (chronic): Secondary | ICD-10-CM | POA: Diagnosis present

## 2022-10-01 DIAGNOSIS — M109 Gout, unspecified: Secondary | ICD-10-CM | POA: Diagnosis present

## 2022-10-01 DIAGNOSIS — K5900 Constipation, unspecified: Secondary | ICD-10-CM | POA: Diagnosis not present

## 2022-10-01 DIAGNOSIS — Z8541 Personal history of malignant neoplasm of cervix uteri: Secondary | ICD-10-CM

## 2022-10-01 HISTORY — DX: Acute respiratory failure, unspecified whether with hypoxia or hypercapnia: J96.00

## 2022-10-01 HISTORY — DX: Acute respiratory failure with hypoxia: J96.01

## 2022-10-01 LAB — CBC WITH DIFFERENTIAL/PLATELET
Abs Immature Granulocytes: 0.1 10*3/uL — ABNORMAL HIGH (ref 0.00–0.07)
Basophils Absolute: 0 10*3/uL (ref 0.0–0.1)
Basophils Relative: 1 %
Eosinophils Absolute: 0 10*3/uL (ref 0.0–0.5)
Eosinophils Relative: 0 %
HCT: 37.4 % (ref 36.0–46.0)
Hemoglobin: 11.7 g/dL — ABNORMAL LOW (ref 12.0–15.0)
Immature Granulocytes: 1 %
Lymphocytes Relative: 9 %
Lymphs Abs: 0.8 10*3/uL (ref 0.7–4.0)
MCH: 30.3 pg (ref 26.0–34.0)
MCHC: 31.3 g/dL (ref 30.0–36.0)
MCV: 96.9 fL (ref 80.0–100.0)
Monocytes Absolute: 1.1 10*3/uL — ABNORMAL HIGH (ref 0.1–1.0)
Monocytes Relative: 13 %
Neutro Abs: 6.6 10*3/uL (ref 1.7–7.7)
Neutrophils Relative %: 76 %
Platelets: 223 10*3/uL (ref 150–400)
RBC: 3.86 MIL/uL — ABNORMAL LOW (ref 3.87–5.11)
RDW: 14.7 % (ref 11.5–15.5)
WBC: 8.7 10*3/uL (ref 4.0–10.5)
nRBC: 0 % (ref 0.0–0.2)

## 2022-10-01 LAB — I-STAT VENOUS BLOOD GAS, ED
Acid-Base Excess: 11 mmol/L — ABNORMAL HIGH (ref 0.0–2.0)
Bicarbonate: 36.2 mmol/L — ABNORMAL HIGH (ref 20.0–28.0)
Calcium, Ion: 0.95 mmol/L — ABNORMAL LOW (ref 1.15–1.40)
HCT: 38 % (ref 36.0–46.0)
Hemoglobin: 12.9 g/dL (ref 12.0–15.0)
O2 Saturation: 49 %
Potassium: 3.6 mmol/L (ref 3.5–5.1)
Sodium: 134 mmol/L — ABNORMAL LOW (ref 135–145)
TCO2: 38 mmol/L — ABNORMAL HIGH (ref 22–32)
pCO2, Ven: 50.9 mmHg (ref 44–60)
pH, Ven: 7.46 — ABNORMAL HIGH (ref 7.25–7.43)
pO2, Ven: 26 mmHg — CL (ref 32–45)

## 2022-10-01 LAB — COMPREHENSIVE METABOLIC PANEL
ALT: 15 U/L (ref 0–44)
AST: 20 U/L (ref 15–41)
Albumin: 3.2 g/dL — ABNORMAL LOW (ref 3.5–5.0)
Alkaline Phosphatase: 110 U/L (ref 38–126)
Anion gap: 17 — ABNORMAL HIGH (ref 5–15)
BUN: 19 mg/dL (ref 8–23)
CO2: 30 mmol/L (ref 22–32)
Calcium: 8.4 mg/dL — ABNORMAL LOW (ref 8.9–10.3)
Chloride: 90 mmol/L — ABNORMAL LOW (ref 98–111)
Creatinine, Ser: 4.62 mg/dL — ABNORMAL HIGH (ref 0.44–1.00)
GFR, Estimated: 9 mL/min — ABNORMAL LOW (ref >60)
Glucose, Bld: 121 mg/dL — ABNORMAL HIGH (ref 70–99)
Potassium: 3.7 mmol/L (ref 3.5–5.1)
Sodium: 137 mmol/L (ref 135–145)
Total Bilirubin: 0.6 mg/dL (ref 0.3–1.2)
Total Protein: 7.7 g/dL (ref 6.5–8.1)

## 2022-10-01 LAB — I-STAT CHEM 8, ED
BUN: 25 mg/dL — ABNORMAL HIGH (ref 8–23)
Calcium, Ion: 0.95 mmol/L — ABNORMAL LOW (ref 1.15–1.40)
Chloride: 91 mmol/L — ABNORMAL LOW (ref 98–111)
Creatinine, Ser: 4.7 mg/dL — ABNORMAL HIGH (ref 0.44–1.00)
Glucose, Bld: 123 mg/dL — ABNORMAL HIGH (ref 70–99)
HCT: 38 % (ref 36.0–46.0)
Hemoglobin: 12.9 g/dL (ref 12.0–15.0)
Potassium: 3.6 mmol/L (ref 3.5–5.1)
Sodium: 134 mmol/L — ABNORMAL LOW (ref 135–145)
TCO2: 37 mmol/L — ABNORMAL HIGH (ref 22–32)

## 2022-10-01 LAB — BRAIN NATRIURETIC PEPTIDE: B Natriuretic Peptide: 473.5 pg/mL — ABNORMAL HIGH (ref 0.0–100.0)

## 2022-10-01 LAB — TROPONIN I (HIGH SENSITIVITY)
Troponin I (High Sensitivity): 43 ng/L — ABNORMAL HIGH (ref <18)
Troponin I (High Sensitivity): 43 ng/L — ABNORMAL HIGH (ref <18)

## 2022-10-01 LAB — RESP PANEL BY RT-PCR (RSV, FLU A&B, COVID)  RVPGX2
Influenza A by PCR: NEGATIVE
Influenza B by PCR: NEGATIVE
Resp Syncytial Virus by PCR: NEGATIVE
SARS Coronavirus 2 by RT PCR: NEGATIVE

## 2022-10-01 LAB — MAGNESIUM: Magnesium: 2 mg/dL (ref 1.7–2.4)

## 2022-10-01 LAB — HEPATITIS B SURFACE ANTIGEN: Hepatitis B Surface Ag: NONREACTIVE

## 2022-10-01 MED ORDER — NOREPINEPHRINE 4 MG/250ML-% IV SOLN
INTRAVENOUS | Status: AC
  Administered 2022-10-01: 2 ug/min via INTRAVENOUS
  Filled 2022-10-01: qty 250

## 2022-10-01 MED ORDER — AMIODARONE HCL IN DEXTROSE 360-4.14 MG/200ML-% IV SOLN
30.0000 mg/h | INTRAVENOUS | Status: DC
  Administered 2022-10-01: 30 mg/h via INTRAVENOUS
  Administered 2022-10-02: 60 mg/h via INTRAVENOUS
  Administered 2022-10-02 (×2): 30 mg/h via INTRAVENOUS
  Filled 2022-10-01 (×4): qty 200

## 2022-10-01 MED ORDER — LIDOCAINE-PRILOCAINE 2.5-2.5 % EX CREA: 1.0000 | TOPICAL_CREAM | CUTANEOUS | Status: DC | PRN

## 2022-10-01 MED ORDER — PENTAFLUOROPROP-TETRAFLUOROETH EX AERO: 1.0000 | INHALATION_SPRAY | CUTANEOUS | Status: DC | PRN

## 2022-10-01 MED ORDER — CHLORHEXIDINE GLUCONATE CLOTH 2 % EX PADS
6.0000 | MEDICATED_PAD | Freq: Every day | CUTANEOUS | Status: DC
  Administered 2022-10-02 – 2022-10-06 (×6): 6 via TOPICAL

## 2022-10-01 MED ORDER — APIXABAN 5 MG PO TABS
5.0000 mg | ORAL_TABLET | Freq: Two times a day (BID) | ORAL | Status: DC
  Filled 2022-10-01: qty 1

## 2022-10-01 MED ORDER — FERRIC CITRATE 1 GM 210 MG(FE) PO TABS
210.0000 mg | ORAL_TABLET | Freq: Three times a day (TID) | ORAL | Status: DC
  Administered 2022-10-03 – 2022-10-06 (×10): 210 mg via ORAL
  Filled 2022-10-01 (×14): qty 1

## 2022-10-01 MED ORDER — SODIUM CHLORIDE 0.9 % IV SOLN: INTRAVENOUS | Status: DC | PRN

## 2022-10-01 MED ORDER — LIDOCAINE HCL 1 % IJ SOLN
5.0000 mL | Freq: Once | INTRAMUSCULAR | Status: DC
  Filled 2022-10-01: qty 5

## 2022-10-01 MED ORDER — IOHEXOL 350 MG/ML SOLN
75.0000 mL | Freq: Once | INTRAVENOUS | Status: AC | PRN
  Administered 2022-10-01: 75 mL via INTRAVENOUS

## 2022-10-01 MED ORDER — HEPARIN SODIUM (PORCINE) 1000 UNIT/ML DIALYSIS: 1000.0000 [IU] | INTRAMUSCULAR | Status: DC | PRN

## 2022-10-01 MED ORDER — PANTOPRAZOLE SODIUM 40 MG IV SOLR
40.0000 mg | Freq: Every day | INTRAVENOUS | Status: DC
  Administered 2022-10-02 (×2): 40 mg via INTRAVENOUS
  Filled 2022-10-01 (×2): qty 10

## 2022-10-01 MED ORDER — NOREPINEPHRINE 4 MG/250ML-% IV SOLN: 2.0000 ug/min | INTRAVENOUS | Status: DC

## 2022-10-01 MED ORDER — ALUM & MAG HYDROXIDE-SIMETH 200-200-20 MG/5ML PO SUSP: 30.0000 mL | ORAL | Status: DC | PRN

## 2022-10-01 MED ORDER — ALTEPLASE 2 MG IJ SOLR: 2.0000 mg | Freq: Once | INTRAMUSCULAR | Status: DC | PRN

## 2022-10-01 MED ORDER — LIDOCAINE HCL (PF) 1 % IJ SOLN
5.0000 mL | Freq: Once | INTRAMUSCULAR | Status: AC
  Administered 2022-10-01: 5 mL via INTRADERMAL

## 2022-10-01 MED ORDER — ATORVASTATIN CALCIUM 10 MG PO TABS
20.0000 mg | ORAL_TABLET | Freq: Every day | ORAL | Status: DC
  Administered 2022-10-03 – 2022-10-06 (×4): 20 mg via ORAL
  Filled 2022-10-01 (×4): qty 2

## 2022-10-01 MED ORDER — AMIODARONE HCL 200 MG PO TABS: 200.0000 mg | ORAL_TABLET | Freq: Every day | ORAL | Status: DC

## 2022-10-01 MED ORDER — LIDOCAINE HCL (PF) 1 % IJ SOLN
5.0000 mL | INTRAMUSCULAR | Status: DC | PRN
  Filled 2022-10-01 (×2): qty 5

## 2022-10-01 MED ORDER — SODIUM CHLORIDE 0.9 % IV SOLN
250.0000 mL | INTRAVENOUS | Status: DC
  Administered 2022-10-01 – 2022-10-02 (×2): 250 mL via INTRAVENOUS

## 2022-10-01 NOTE — Assessment & Plan Note (Addendum)
Rate controlled. - Cont home amiodarone and eliquis

## 2022-10-01 NOTE — ED Notes (Signed)
Attempted to call report to the ICU x2.

## 2022-10-01 NOTE — ED Notes (Signed)
MD at bedside removed pt's bipap to trail pt off bipap. Pt desated to 80% and became diaphoretic and lethargic. Bipap placed back on pt. Pt AAOx4 and 97% on bipap.

## 2022-10-01 NOTE — ED Notes (Signed)
Attempted to give report to the ICU x1.

## 2022-10-01 NOTE — Consult Note (Signed)
Cardiology Consultation:   Patient ID: Gloria Gallagher MRN: [id number]; DOB: April 08, 1951  Admit date: 10/01/2022 Date of Consult: 10/01/2022  Primary Care Provider: Hillery Aldo, NP Primary Cardiologist: Kristeen Miss, MD *** Primary Electrophysiologist:  None ***   Patient Profile:   Gloria Gallagher is a 72 y.o. female with a hx of *** who is being seen today for the evaluation of *** at the request of ***.  History of Present Illness:   Gloria Gallagher ***  Past Medical History:  Diagnosis Date   Anemia    Aortic atherosclerosis (HCC)    Arthritis    Atrial fibrillation (HCC)    CAD (coronary artery disease)    COVID 2021   mild   Dialysis patient (HCC)    ESRD on hemodialysis (HCC)    MWF at [facility]   GERD (gastroesophageal reflux disease)    Gout    Heart murmur    Hyperparathyroidism (HCC)    Hypertension    in the past, no longer takes medications   Neuromuscular disorder (HCC)    neuropathy   Pneumonia    Seasonal allergies     Past Surgical History:  Procedure Laterality Date   CARDIOVERSION N/A 05/16/2021   Procedure: CARDIOVERSION;  Surgeon: Elease Hashimoto Deloris Ping, MD;  Location: [facility] ENDOSCOPY;  Service: Cardiovascular;  Laterality: N/A;   CARDIOVERSION N/A 08/01/2021   Procedure: CARDIOVERSION;  Surgeon: Thurmon Fair, MD;  Location: [facility] ENDOSCOPY;  Service: Cardiovascular;  Laterality: N/A;   CARDIOVERSION N/A 12/05/2021   Procedure: CARDIOVERSION;  Surgeon: Thomasene Ripple, DO;  Location: [facility] ENDOSCOPY;  Service: Cardiovascular;  Laterality: N/A;   DIALYSIS FISTULA CREATION Left    REVISON OF ARTERIOVENOUS FISTULA Left 02/16/2022   Procedure: LEFT ARM ARTERIOVENOUS FISTULA REVISION;  Surgeon: Leonie Douglas, MD;  Location: [facility] OR;  Service: Vascular;  Laterality: Left;  PERIPHERAL NERVE BLOCK   TUBAL LIGATION       {Home Medications (Optional):21181}  Inpatient Medications: Scheduled Meds:  apixaban  5 mg Oral BID   [START ON 10/02/2022] atorvastatin   20 mg Oral Daily   [START ON 10/02/2022] Chlorhexidine Gluconate Cloth  6 each Topical Q0600   [START ON 10/02/2022] ferric citrate  210 mg Oral TID WC   lidocaine  5 mL Intradermal Once   pantoprazole (PROTONIX) IV  40 mg Intravenous QHS   Continuous Infusions:  sodium chloride     sodium chloride 250 mL (10/01/22 2208)   [START ON 10/02/2022] amiodarone 30 mg/hr (10/01/22 2054)   norepinephrine (LEVOPHED) Adult infusion 2 mcg/min (10/01/22 2159)   PRN Meds: Place/Maintain arterial line **AND** sodium chloride, alteplase, alum & mag hydroxide-simeth, heparin, lidocaine (PF), lidocaine-prilocaine, pentafluoroprop-tetrafluoroeth  Allergies:    Allergies  Allergen Reactions   Penicillin G Hives and Itching   Shrimp (Diagnostic) Hives    Social History:   Social History   Socioeconomic History   Marital status: Single    Spouse name: Not on file   Number of children: 1   Years of education: Not on file   Highest education level: Not on file  Occupational History   Occupation: retired  Tobacco Use   Smoking status: Former    Types: Cigarettes    Passive exposure: Never   Smokeless tobacco: Never   Tobacco comments:    Former smoker 04/25/2021  Vaping Use   Vaping Use: Never used  Substance and Sexual Activity   Alcohol use: Yes    Alcohol/week: 1.0 standard drink of alcohol  Types: 1 Cans of beer per week    Comment: 1 beer every other week 06/20/21   Drug use: Never   Sexual activity: Not Currently  Other Topics Concern   Not on file  Social History Narrative   Not on file   Social Determinants of Health   Financial Resource Strain: Not on file  Food Insecurity: No Food Insecurity (01/18/2022)   Hunger Vital Sign    Worried About Running Out of Food in the Last Year: Never true    Ran Out of Food in the Last Year: Never true  Transportation Needs: No Transportation Needs (01/18/2022)   PRAPARE - Administrator, Civil Service (Medical): No    Lack of  Transportation (Non-Medical): No  Physical Activity: Not on file  Stress: Not on file  Social Connections: Not on file  Intimate Partner Violence: Not At Risk (01/18/2022)   Humiliation, Afraid, Rape, and Kick questionnaire    Fear of Current or Ex-Partner: No    Emotionally Abused: No    Physically Abused: No    Sexually Abused: No    Family History:   *** Family History  Problem Relation Age of Onset   Diabetes Sister    Stomach cancer Neg Hx    Colon cancer Neg Hx      Review of Systems: [y] = yes, [ ]  = no    General: Weight gain [ ] ; Weight loss [ ] ; Anorexia [ ] ; Fatigue [ ] ; Fever [ ] ; Chills [ ] ; Weakness [ ]   Cardiac: Chest pain/pressure [ ] ; Resting SOB [ ] ; Exertional SOB [ ] ; Orthopnea [ ] ; Pedal Edema [ ] ; Palpitations [ ] ; Syncope [ ] ; Presyncope [ ] ; Paroxysmal nocturnal dyspnea[ ]   Pulmonary: Cough [ ] ; Wheezing[ ] ; Hemoptysis[ ] ; Sputum [ ] ; Snoring [ ]   GI: Vomiting[ ] ; Dysphagia[ ] ; Melena[ ] ; Hematochezia [ ] ; Heartburn[ ] ; Abdominal pain [ ] ; Constipation [ ] ; Diarrhea [ ] ; BRBPR [ ]   GU: Hematuria[ ] ; Dysuria [ ] ; Nocturia[ ]   Vascular: Pain in legs with walking [ ] ; Pain in feet with lying flat [ ] ; Non-healing sores [ ] ; Stroke [ ] ; TIA [ ] ; Slurred speech [ ] ;  Neuro: Headaches[ ] ; Vertigo[ ] ; Seizures[ ] ; Paresthesias[ ] ;Blurred vision [ ] ; Diplopia [ ] ; Vision changes [ ]   Ortho/Skin: Arthritis [ ] ; Joint pain [ ] ; Muscle pain [ ] ; Joint swelling [ ] ; Back Pain [ ] ; Rash [ ]   Psych: Depression[ ] ; Anxiety[ ]   Heme: Bleeding problems [ ] ; Clotting disorders [ ] ; Anemia [ ]   Endocrine: Diabetes [ ] ; Thyroid dysfunction[ ]   Physical Exam/Data:   Vitals:   10/01/22 2135 10/01/22 2140 10/01/22 2150 10/01/22 2215  BP:   (!) 82/47 113/65  Pulse:  (!) 101    Resp: (!) 24 (!) 23 (!) 24 (!) 25  Temp:      TempSrc:      SpO2:  92%     No intake or output data in the 24 hours ending 10/01/22 2232 There were no vitals filed for this visit. There is no height  or weight on file to calculate BMI.  General:  Well nourished, well developed, in no acute distress*** HEENT: normal Lymph: no adenopathy Neck: no JVD Endocrine:  No thryomegaly Vascular: No carotid bruits; FA pulses 2+ bilaterally without bruits  Cardiac:  normal S1, S2; RRR; no murmur *** Lungs:  clear to auscultation bilaterally, no wheezing, rhonchi or rales  Abd: soft,  nontender, no hepatomegaly  Ext: no edema Musculoskeletal:  No deformities, BUE and BLE strength normal and equal Skin: warm and dry  Neuro:  CNs 2-12 intact, no focal abnormalities noted Psych:  Normal affect   EKG:  The EKG was personally reviewed and demonstrates:  *** Telemetry:  Telemetry was personally reviewed and demonstrates:  ***  Relevant CV Studies: ***  Laboratory Data:  Chemistry Recent Labs  Lab 09/25/22 0310 10/01/22 1333 10/01/22 1338  NA 135 137 134*  134*  K 3.8 3.7 3.6  3.6  CL 91* 90* 91*  CO2 29 30  --   GLUCOSE 113* 121* 123*  BUN 35* 19 25*  CREATININE 6.40* 4.62* 4.70*  CALCIUM 8.0* 8.4*  --   GFRNONAA 6* 9*  --   ANIONGAP 15 17*  --     Recent Labs  Lab 10/01/22 1333  PROT 7.7  ALBUMIN 3.2*  AST 20  ALT 15  ALKPHOS 110  BILITOT 0.6   Hematology Recent Labs  Lab 09/25/22 0310 10/01/22 1333 10/01/22 1338  WBC 6.0 8.7  --   RBC 4.00 3.86*  --   HGB 11.8* 11.7* 12.9  12.9  HCT 37.8 37.4 38.0  38.0  MCV 94.5 96.9  --   MCH 29.5 30.3  --   MCHC 31.2 31.3  --   RDW 14.7 14.7  --   PLT 149* 223  --    Cardiac EnzymesNo results for input(s): "TROPONINI" in the last 168 hours. No results for input(s): "TROPIPOC" in the last 168 hours.  BNP Recent Labs  Lab 09/25/22 0310 10/01/22 1333  BNP 848.7* 473.5*    DDimer No results for input(s): "DDIMER" in the last 168 hours.  Radiology/Studies:  CT Angio Chest PE W and/or Wo Contrast  Result Date: 10/01/2022 CLINICAL DATA:  Concern for pulmonary embolism. EXAM: CT ANGIOGRAPHY CHEST WITH CONTRAST  TECHNIQUE: Multidetector CT imaging of the chest was performed using the standard protocol during bolus administration of intravenous contrast. Multiplanar CT image reconstructions and MIPs were obtained to evaluate the vascular anatomy. RADIATION DOSE REDUCTION: This exam was performed according to the departmental dose-optimization program which includes automated exposure control, adjustment of the mA and/or kV according to patient size and/or use of iterative reconstruction technique. CONTRAST:  75mL OMNIPAQUE IOHEXOL 350 MG/ML SOLN COMPARISON:  Chest radiograph dated 10/01/2022 and CT dated 09/25/2022. FINDINGS: Evaluation of this exam is limited due to respiratory motion artifact. Cardiovascular: There is mild cardiomegaly. There has been interval development of a large pericardial effusion measuring up to 2.2 cm in thickness. Correlation with clinical exam recommended to evaluate for possibility of cardiac tamponade. Three vessel coronary vascular calcification. There is retrograde flow of contrast from the right atrium into the IVC suggestive of right heart dysfunction. There is moderate atherosclerotic calcification of the thoracic aorta. No aneurysmal dilatation. Dilatation of the main pulmonary trunk suggestive of pulmonary hypertension. Evaluation of the pulmonary arteries is limited due to respiratory motion and suboptimal visualization of the peripheral branches. No central pulmonary artery embolus identified. Mediastinum/Nodes: No hilar or mediastinal adenopathy. The esophagus is grossly unremarkable. No mediastinal fluid collection. Lungs/Pleura: Background of emphysema. There is a 1 cm right lower lobe nodule (88/6). There are bibasilar atelectasis. Small left pleural effusion and left lung base atelectasis. Pneumonia is not excluded. Clinical correlation is recommended. There is no pneumothorax. The central airways are patent. Upper Abdomen: Polycystic renal and liver morphology. Similar appearance  of calcification in the right lobe of the  liver. Musculoskeletal: Renal osteodystrophy.  No acute osseous pathology. Review of the MIP images confirms the above findings. IMPRESSION: 1. No CT evidence of central pulmonary artery embolus. 2. Interval development of a large pericardial effusion. Correlation with clinical exam recommended to evaluate for possibility of cardiac tamponade. 3. Small left pleural effusion and left lung base atelectasis. Pneumonia is not excluded. 4. A 1 cm right lower lobe nodule. Consider one of the following in 3 months for both low-risk and high-risk individuals: (a) repeat chest CT, (b) follow-up PET-CT, or (c) tissue sampling. This recommendation follows the consensus statement: Guidelines for Management of Incidental Pulmonary Nodules Detected on CT Images: From the Fleischner Society 2017; Radiology 2017; 284:228-243. 5.  Aortic Atherosclerosis (ICD10-I70.0). These results will be called to the ordering clinician or representative by the Radiologist Assistant, and communication documented in the PACS or [employer]. Electronically Signed   By: Elgie Collard M.D.   On: 10/01/2022 20:39   DG Chest Port 1 View  Result Date: 10/01/2022 CLINICAL DATA:  dyspnea EXAM: PORTABLE CHEST - 1 VIEW COMPARISON:  09/25/2022 FINDINGS: Interstitial prominence with pulmonary opacities improved at the left lung base, slightly increased laterally in the right mid lung. Stable cardiomegaly. No effusion. Chronic calcifications inferior to the distal left scapula. IMPRESSION: Improving left lower lobe airspace disease. Electronically Signed   By: Corlis Leak M.D.   On: 10/01/2022 14:03    Assessment and Plan:   ***  {Are we signing off today?:[id number]}  For questions or updates, please contact [facility] Please consult [email or web address] for contact info under     Signed, Joellen Jersey, MD  10/01/2022 10:32 PM

## 2022-10-01 NOTE — Progress Notes (Signed)
Patient changed from 39M to 2 H per [facility]. Bed assigned at 1800 and report given to RN at 1805.

## 2022-10-01 NOTE — H&P (View-Only) (Signed)
Cardiology Consultation:   Patient ID: Gloria Gallagher MRN: [id number]; DOB: 03/29/1951  Admit date: 10/01/2022 Date of Consult: 10/01/2022  Primary Care Provider: Stowe, Shanna, NP Primary Cardiologist: Philip Nahser, MD *** Primary Electrophysiologist:  None ***   Patient Profile:   Gloria Gallagher is a 72 y.o. female with a hx of *** who is being seen today for the evaluation of *** at the request of ***.  History of Present Illness:   Gloria Gallagher ***  Past Medical History:  Diagnosis Date   Anemia    Aortic atherosclerosis (HCC)    Arthritis    Atrial fibrillation (HCC)    CAD (coronary artery disease)    COVID 2021   mild   Dialysis patient (HCC)    ESRD on hemodialysis (HCC)    MWF at [facility]   GERD (gastroesophageal reflux disease)    Gout    Heart murmur    Hyperparathyroidism (HCC)    Hypertension    in the past, no longer takes medications   Neuromuscular disorder (HCC)    neuropathy   Pneumonia    Seasonal allergies     Past Surgical History:  Procedure Laterality Date   CARDIOVERSION N/A 05/16/2021   Procedure: CARDIOVERSION;  Surgeon: Nahser, Philip J, MD;  Location: [facility] ENDOSCOPY;  Service: Cardiovascular;  Laterality: N/A;   CARDIOVERSION N/A 08/01/2021   Procedure: CARDIOVERSION;  Surgeon: Croitoru, Mihai, MD;  Location: [facility] ENDOSCOPY;  Service: Cardiovascular;  Laterality: N/A;   CARDIOVERSION N/A 12/05/2021   Procedure: CARDIOVERSION;  Surgeon: Tobb, Kardie, DO;  Location: [facility] ENDOSCOPY;  Service: Cardiovascular;  Laterality: N/A;   DIALYSIS FISTULA CREATION Left    REVISON OF ARTERIOVENOUS FISTULA Left 02/16/2022   Procedure: LEFT ARM ARTERIOVENOUS FISTULA REVISION;  Surgeon: Hawken, Thomas N, MD;  Location: [facility] OR;  Service: Vascular;  Laterality: Left;  PERIPHERAL NERVE BLOCK   TUBAL LIGATION       {Home Medications (Optional):21181}  Inpatient Medications: Scheduled Meds:  apixaban  5 mg Oral BID   [START ON 10/02/2022] atorvastatin   20 mg Oral Daily   [START ON 10/02/2022] Chlorhexidine Gluconate Cloth  6 each Topical Q0600   [START ON 10/02/2022] ferric citrate  210 mg Oral TID WC   lidocaine  5 mL Intradermal Once   pantoprazole (PROTONIX) IV  40 mg Intravenous QHS   Continuous Infusions:  sodium chloride     sodium chloride 250 mL (10/01/22 2208)   [START ON 10/02/2022] amiodarone 30 mg/hr (10/01/22 2054)   norepinephrine (LEVOPHED) Adult infusion 2 mcg/min (10/01/22 2159)   PRN Meds: Place/Maintain arterial line **AND** sodium chloride, alteplase, alum & mag hydroxide-simeth, heparin, lidocaine (PF), lidocaine-prilocaine, pentafluoroprop-tetrafluoroeth  Allergies:    Allergies  Allergen Reactions   Penicillin G Hives and Itching   Shrimp (Diagnostic) Hives    Social History:   Social History   Socioeconomic History   Marital status: Single    Spouse name: Not on file   Number of children: 1   Years of education: Not on file   Highest education level: Not on file  Occupational History   Occupation: retired  Tobacco Use   Smoking status: Former    Types: Cigarettes    Passive exposure: Never   Smokeless tobacco: Never   Tobacco comments:    Former smoker 04/25/2021  Vaping Use   Vaping Use: Never used  Substance and Sexual Activity   Alcohol use: Yes    Alcohol/week: 1.0 standard drink of alcohol      Types: 1 Cans of beer per week    Comment: 1 beer every other week 06/20/21   Drug use: Never   Sexual activity: Not Currently  Other Topics Concern   Not on file  Social History Narrative   Not on file   Social Determinants of Health   Financial Resource Strain: Not on file  Food Insecurity: No Food Insecurity (01/18/2022)   Hunger Vital Sign    Worried About Running Out of Food in the Last Year: Never true    Ran Out of Food in the Last Year: Never true  Transportation Needs: No Transportation Needs (01/18/2022)   PRAPARE - Transportation    Lack of Transportation (Medical): No    Lack of  Transportation (Non-Medical): No  Physical Activity: Not on file  Stress: Not on file  Social Connections: Not on file  Intimate Partner Violence: Not At Risk (01/18/2022)   Humiliation, Afraid, Rape, and Kick questionnaire    Fear of Current or Ex-Partner: No    Emotionally Abused: No    Physically Abused: No    Sexually Abused: No    Family History:   *** Family History  Problem Relation Age of Onset   Diabetes Sister    Stomach cancer Neg Hx    Colon cancer Neg Hx      Review of Systems: [y] = yes, [ ] = no    General: Weight gain [ ]; Weight loss [ ]; Anorexia [ ]; Fatigue [ ]; Fever [ ]; Chills [ ]; Weakness [ ]  Cardiac: Chest pain/pressure [ ]; Resting SOB [ ]; Exertional SOB [ ]; Orthopnea [ ]; Pedal Edema [ ]; Palpitations [ ]; Syncope [ ]; Presyncope [ ]; Paroxysmal nocturnal dyspnea[ ]  Pulmonary: Cough [ ]; Wheezing[ ]; Hemoptysis[ ]; Sputum [ ]; Snoring [ ]  GI: Vomiting[ ]; Dysphagia[ ]; Melena[ ]; Hematochezia [ ]; Heartburn[ ]; Abdominal pain [ ]; Constipation [ ]; Diarrhea [ ]; BRBPR [ ]  GU: Hematuria[ ]; Dysuria [ ]; Nocturia[ ]  Vascular: Pain in legs with walking [ ]; Pain in feet with lying flat [ ]; Non-healing sores [ ]; Stroke [ ]; TIA [ ]; Slurred speech [ ];  Neuro: Headaches[ ]; Vertigo[ ]; Seizures[ ]; Paresthesias[ ];Blurred vision [ ]; Diplopia [ ]; Vision changes [ ]  Ortho/Skin: Arthritis [ ]; Joint pain [ ]; Muscle pain [ ]; Joint swelling [ ]; Back Pain [ ]; Rash [ ]  Psych: Depression[ ]; Anxiety[ ]  Heme: Bleeding problems [ ]; Clotting disorders [ ]; Anemia [ ]  Endocrine: Diabetes [ ]; Thyroid dysfunction[ ]  Physical Exam/Data:   Vitals:   10/01/22 2135 10/01/22 2140 10/01/22 2150 10/01/22 2215  BP:   (!) 82/47 113/65  Pulse:  (!) 101    Resp: (!) 24 (!) 23 (!) 24 (!) 25  Temp:      TempSrc:      SpO2:  92%     No intake or output data in the 24 hours ending 10/01/22 2232 There were no vitals filed for this visit. There is no height  or weight on file to calculate BMI.  General:  Well nourished, well developed, in no acute distress*** HEENT: normal Lymph: no adenopathy Neck: no JVD Endocrine:  No thryomegaly Vascular: No carotid bruits; FA pulses 2+ bilaterally without bruits  Cardiac:  normal S1, S2; RRR; no murmur *** Lungs:  clear to auscultation bilaterally, no wheezing, rhonchi or rales  Abd: soft,   nontender, no hepatomegaly  Ext: no edema Musculoskeletal:  No deformities, BUE and BLE strength normal and equal Skin: warm and dry  Neuro:  CNs 2-12 intact, no focal abnormalities noted Psych:  Normal affect   EKG:  The EKG was personally reviewed and demonstrates:  *** Telemetry:  Telemetry was personally reviewed and demonstrates:  ***  Relevant CV Studies: ***  Laboratory Data:  Chemistry Recent Labs  Lab 09/25/22 0310 10/01/22 1333 10/01/22 1338  NA 135 137 134*  134*  K 3.8 3.7 3.6  3.6  CL 91* 90* 91*  CO2 29 30  --   GLUCOSE 113* 121* 123*  BUN 35* 19 25*  CREATININE 6.40* 4.62* 4.70*  CALCIUM 8.0* 8.4*  --   GFRNONAA 6* 9*  --   ANIONGAP 15 17*  --     Recent Labs  Lab 10/01/22 1333  PROT 7.7  ALBUMIN 3.2*  AST 20  ALT 15  ALKPHOS 110  BILITOT 0.6   Hematology Recent Labs  Lab 09/25/22 0310 10/01/22 1333 10/01/22 1338  WBC 6.0 8.7  --   RBC 4.00 3.86*  --   HGB 11.8* 11.7* 12.9  12.9  HCT 37.8 37.4 38.0  38.0  MCV 94.5 96.9  --   MCH 29.5 30.3  --   MCHC 31.2 31.3  --   RDW 14.7 14.7  --   PLT 149* 223  --    Cardiac EnzymesNo results for input(s): "TROPONINI" in the last 168 hours. No results for input(s): "TROPIPOC" in the last 168 hours.  BNP Recent Labs  Lab 09/25/22 0310 10/01/22 1333  BNP 848.7* 473.5*    DDimer No results for input(s): "DDIMER" in the last 168 hours.  Radiology/Studies:  CT Angio Chest PE W and/or Wo Contrast  Result Date: 10/01/2022 CLINICAL DATA:  Concern for pulmonary embolism. EXAM: CT ANGIOGRAPHY CHEST WITH CONTRAST  TECHNIQUE: Multidetector CT imaging of the chest was performed using the standard protocol during bolus administration of intravenous contrast. Multiplanar CT image reconstructions and MIPs were obtained to evaluate the vascular anatomy. RADIATION DOSE REDUCTION: This exam was performed according to the departmental dose-optimization program which includes automated exposure control, adjustment of the mA and/or kV according to patient size and/or use of iterative reconstruction technique. CONTRAST:  75mL OMNIPAQUE IOHEXOL 350 MG/ML SOLN COMPARISON:  Chest radiograph dated 10/01/2022 and CT dated 09/25/2022. FINDINGS: Evaluation of this exam is limited due to respiratory motion artifact. Cardiovascular: There is mild cardiomegaly. There has been interval development of a large pericardial effusion measuring up to 2.2 cm in thickness. Correlation with clinical exam recommended to evaluate for possibility of cardiac tamponade. Three vessel coronary vascular calcification. There is retrograde flow of contrast from the right atrium into the IVC suggestive of right heart dysfunction. There is moderate atherosclerotic calcification of the thoracic aorta. No aneurysmal dilatation. Dilatation of the main pulmonary trunk suggestive of pulmonary hypertension. Evaluation of the pulmonary arteries is limited due to respiratory motion and suboptimal visualization of the peripheral branches. No central pulmonary artery embolus identified. Mediastinum/Nodes: No hilar or mediastinal adenopathy. The esophagus is grossly unremarkable. No mediastinal fluid collection. Lungs/Pleura: Background of emphysema. There is a 1 cm right lower lobe nodule (88/6). There are bibasilar atelectasis. Small left pleural effusion and left lung base atelectasis. Pneumonia is not excluded. Clinical correlation is recommended. There is no pneumothorax. The central airways are patent. Upper Abdomen: Polycystic renal and liver morphology. Similar appearance  of calcification in the right lobe of the   liver. Musculoskeletal: Renal osteodystrophy.  No acute osseous pathology. Review of the MIP images confirms the above findings. IMPRESSION: 1. No CT evidence of central pulmonary artery embolus. 2. Interval development of a large pericardial effusion. Correlation with clinical exam recommended to evaluate for possibility of cardiac tamponade. 3. Small left pleural effusion and left lung base atelectasis. Pneumonia is not excluded. 4. A 1 cm right lower lobe nodule. Consider one of the following in 3 months for both low-risk and high-risk individuals: (a) repeat chest CT, (b) follow-up PET-CT, or (c) tissue sampling. This recommendation follows the consensus statement: Guidelines for Management of Incidental Pulmonary Nodules Detected on CT Images: From the Fleischner Society 2017; Radiology 2017; 284:228-243. 5.  Aortic Atherosclerosis (ICD10-I70.0). These results will be called to the ordering clinician or representative by the Radiologist Assistant, and communication documented in the PACS or [employer]. Electronically Signed   By: Arash  Radparvar M.D.   On: 10/01/2022 20:39   DG Chest Port 1 View  Result Date: 10/01/2022 CLINICAL DATA:  dyspnea EXAM: PORTABLE CHEST - 1 VIEW COMPARISON:  09/25/2022 FINDINGS: Interstitial prominence with pulmonary opacities improved at the left lung base, slightly increased laterally in the right mid lung. Stable cardiomegaly. No effusion. Chronic calcifications inferior to the distal left scapula. IMPRESSION: Improving left lower lobe airspace disease. Electronically Signed   By: D  Hassell M.D.   On: 10/01/2022 14:03    Assessment and Plan:   ***  {Are we signing off today?:[id number]}  For questions or updates, please contact [facility] Please consult [email or web address] for contact info under     Signed, Kasson Lamere Narcisse Jr, MD  10/01/2022 10:32 PM    not think that she tolerated that much fluid removal.  I would give her a trial of fluid back to see how  she responds.  If she does not respond well or continues to worsen then we will need to have discussion on pericardiocentesis.  If she stabilizes throughout the night then she should go for right heart catheterization tomorrow.      For questions or updates, please contact [facility] Please consult [email or web address] for contact info under     Signed, Joellen Jersey, MD  10/01/2022 10:32 PM

## 2022-10-01 NOTE — Consult Note (Addendum)
NAME:  Gloria Gallagher, MRN:  [id number], DOB:  17-Mar-1951, LOS: 0 ADMISSION DATE:  10/01/2022, CONSULTATION DATE:  5/20 REFERRING MD:  Durwin Nora CHIEF COMPLAINT:  dyspnea    History of Present Illness:  72 year old female w/ Hx as listed below. Brought back to ER for second presentation in less than one week time w/ cc: shortness of breath. Receiving her scheduled iHD as ordered. On her last visit (5/14)  she also complained of chest pain. She had CT chest that was negative for PE, but she did have marked ectasia of main pulm artery suggesting pulmonary hypertension, w/ reflux into hepatic veins suggesting tricuspid incompetence.  Was sent home w/ Lidoderm patch. Presents again on 5/20 w/ cc: on-going/worsening shortness of breath, orthopnea and LE edema even after scheduled HD. CXR showed left basilar volume loss c/w prior film but not worse., covid neg, BNP 473 high sens trop I 43  PCCM asked to eval for on-going dyspnea   Pertinent  Medical History  ESRD (MWF), AFib, CAD, HTN, aortic atherosclerosis, COVID, GERD, Gout, hyperparathyroidism, neuropathy, seasonal allergies  Last ECHO 2023 EF 55-60% no WMA, RV systolic fxn reduced, elevated RVP, RA mildly dilated, mod to severe aortic vavle regurg,  Significant Hospital Events: Including procedures, antibiotic start and stop dates in addition to other pertinent events   5/20 presents to ER w/ cc Shortness of breath in spite of full iHD treatment   Interim History / Subjective:  Feels a little better w/ NIPPV   Objective   Blood pressure (Abnormal) 113/90, pulse 98, temperature 97.7 F (36.5 C), temperature source Oral, resp. rate 16, SpO2 96 %.    FiO2 (%):  [60 %] 60 %  No intake or output data in the 24 hours ending 10/01/22 1608 There were no vitals filed for this visit.  Examination: General: 72 year old female resting in bed on NIPPV HENT: NCAT BIPAP mask in place  Lungs: decreased bases. Mild accessory use PCXR w/ left sided  volume loss Cardiovascular: holosystolic HM  Abdomen: soft  Extremities: warm + LE edema  Neuro: awake and oriented Gu anuric   Resolved Hospital Problem list     Assessment & Plan:  Acute hypoxic respiratory failure  Progressive dyspnea and orthopnea  Pulmonary Hypertension (favor type 2 given h/o mod to severe aortic regurg)  Acute Cor pulmonale 2/2 to decompensated PH ESRD HTn Atrial fib  Acute hypoxic resp failure (now requiring NIPPV) secondary to what seems to be decompensated Pulmonary Hypertension (type 2) given her known valvular heart disease. Now w/ evidence of acute cor pulmonale  Plan Cont NIPPV  Will place her in ICU  Will order new ECHO Will likely need Right heart and possibly left heart cath Suspect need to re-evaluate her dry weight goals as well.   ESRD Plan Nephro already consulted.   H/o afib Plan Tele Amio and cont her DOAC   H/o GERD Plan PPI   Best Practice (right click and "Reselect all SmartList Selections" daily)   Diet/type: NPO w/ oral meds DVT prophylaxis: DOAC GI prophylaxis: PPI Lines: N/A Foley:  N/A Code Status:  full code Last date of multidisciplinary goals of care discussion [per primary ]  Labs   CBC: Recent Labs  Lab 09/25/22 0310 10/01/22 1333 10/01/22 1338  WBC 6.0 8.7  --   NEUTROABS 4.8 6.6  --   HGB 11.8* 11.7* 12.9  12.9  HCT 37.8 37.4 38.0  38.0  MCV 94.5 96.9  --  PLT 149* 223  --     Basic Metabolic Panel: Recent Labs  Lab 09/25/22 0310 10/01/22 1333 10/01/22 1338  NA 135 137 134*  134*  K 3.8 3.7 3.6  3.6  CL 91* 90* 91*  CO2 29 30  --   GLUCOSE 113* 121* 123*  BUN 35* 19 25*  CREATININE 6.40* 4.62* 4.70*  CALCIUM 8.0* 8.4*  --   MG  --  2.0  --    GFR: Estimated Creatinine Clearance: 10.5 mL/min (A) (by C-G formula based on SCr of 4.7 mg/dL (H)). Recent Labs  Lab 09/25/22 0310 10/01/22 1333  WBC 6.0 8.7    Liver Function Tests: Recent Labs  Lab 10/01/22 1333  AST 20   ALT 15  ALKPHOS 110  BILITOT 0.6  PROT 7.7  ALBUMIN 3.2*   No results for input(s): "LIPASE", "AMYLASE" in the last 168 hours. No results for input(s): "AMMONIA" in the last 168 hours.  ABG    Component Value Date/Time   HCO3 36.2 (H) 10/01/2022 1338   TCO2 37 (H) 10/01/2022 1338   TCO2 38 (H) 10/01/2022 1338   O2SAT 49 10/01/2022 1338     Coagulation Profile: No results for input(s): "INR", "PROTIME" in the last 168 hours.  Cardiac Enzymes: No results for input(s): "CKTOTAL", "CKMB", "CKMBINDEX", "TROPONINI" in the last 168 hours.  HbA1C: Hgb A1c MFr Bld  Date/Time Value Ref Range Status  01/18/2022 02:26 AM 5.3 4.8 - 5.6 % Final    Comment:    (NOTE) Pre diabetes:          5.7%-6.4%  Diabetes:              >6.4%  Glycemic control for   <7.0% adults with diabetes     CBG: No results for input(s): "GLUCAP" in the last 168 hours.  Review of Systems:   Review of Systems  Constitutional:  Positive for malaise/fatigue. Negative for chills, fever and weight loss.  HENT: Negative.    Eyes: Negative.   Respiratory:  Positive for shortness of breath. Negative for sputum production and wheezing.   Cardiovascular:  Positive for orthopnea, leg swelling and PND. Negative for chest pain.  Gastrointestinal: Negative.   Genitourinary: Negative.   Musculoskeletal: Negative.   Skin: Negative.   Neurological: Negative.   Endo/Heme/Allergies: Negative.      Past Medical History:  She,  has a past medical history of Anemia, Aortic atherosclerosis (HCC), Arthritis, Atrial fibrillation (HCC), CAD (coronary artery disease), COVID (2021), Dialysis patient (HCC), ESRD on hemodialysis (HCC), GERD (gastroesophageal reflux disease), Gout, Heart murmur, Hyperparathyroidism (HCC), Hypertension, Neuromuscular disorder (HCC), Pneumonia, and Seasonal allergies.   Surgical History:   Past Surgical History:  Procedure Laterality Date   CARDIOVERSION N/A 05/16/2021   Procedure:  CARDIOVERSION;  Surgeon: Nahser, Deloris Ping, MD;  Location: [facility] ENDOSCOPY;  Service: Cardiovascular;  Laterality: N/A;   CARDIOVERSION N/A 08/01/2021   Procedure: CARDIOVERSION;  Surgeon: Thurmon Fair, MD;  Location: [facility] ENDOSCOPY;  Service: Cardiovascular;  Laterality: N/A;   CARDIOVERSION N/A 12/05/2021   Procedure: CARDIOVERSION;  Surgeon: Thomasene Ripple, DO;  Location: [facility] ENDOSCOPY;  Service: Cardiovascular;  Laterality: N/A;   DIALYSIS FISTULA CREATION Left    REVISON OF ARTERIOVENOUS FISTULA Left 02/16/2022   Procedure: LEFT ARM ARTERIOVENOUS FISTULA REVISION;  Surgeon: Leonie Douglas, MD;  Location: [facility] OR;  Service: Vascular;  Laterality: Left;  PERIPHERAL NERVE BLOCK   TUBAL LIGATION       Social History:   reports that she  has quit smoking. Her smoking use included cigarettes. She has never been exposed to tobacco smoke. She has never used smokeless tobacco. She reports current alcohol use of about 1.0 standard drink of alcohol per week. She reports that she does not use drugs.   Family History:  Her family history includes Diabetes in her sister. There is no history of Stomach cancer or Colon cancer.   Allergies Allergies  Allergen Reactions   Penicillin G Hives and Itching   Shrimp (Diagnostic) Hives     Home Medications  Prior to Admission medications   Medication Sig Start Date End Date Taking? Authorizing Provider  acetaminophen (TYLENOL) 500 MG tablet Take 2 tablets (1,000 mg total) by mouth every 6 (six) hours as needed for mild pain or moderate pain. 01/19/22   Alfredo Martinez, MD  amiodarone (PACERONE) 200 MG tablet Take 1 tablet (200 mg total) by mouth daily. 10/19/21   Fenton, Clint R, PA  apixaban (ELIQUIS) 5 MG TABS tablet TAKE 1 TABLET BY MOUTH TWICE A DAY 06/22/22   Nahser, Deloris Ping, MD  atorvastatin (LIPITOR) 20 MG tablet Take 20 mg by mouth daily. 12/05/21   [provider]  diclofenac Sodium (VOLTAREN) 1 % GEL Apply 4 g topically 4 (four) times daily. 01/14/22    Melene Plan, DO  Doxercalciferol (HECTOROL IV) Inject 1 Dose into the vein every Monday, Wednesday, and Friday with hemodialysis. During dialysis, 3X week 11/08/21 11/07/22  [provider]  ferric citrate (AURYXIA) 1 GM 210 MG(Fe) tablet Take 1 tablet (210 mg total) by mouth 3 (three) times daily with meals. 01/19/22   Alfredo Martinez, MD  oxyCODONE-acetaminophen (PERCOCET/ROXICET) 5-325 MG tablet Take 1 tablet by mouth every 6 (six) hours as needed. 02/16/22   Lars Mage, PA-C  Urea (UREA NAIL) 45 % GEL Apply to affected nail daily 08/08/22   Louann Sjogren, DPM     Critical care time: 45 minutes      Simonne Martinet ACNP-BC [facility] Pager # [phone] OR # [phone] if no answer

## 2022-10-01 NOTE — Procedures (Signed)
Arterial Catheter Insertion Procedure Note  Gloria Gallagher  [id number]  1951-01-17  Date:10/01/22  Time:11:20 PM    Provider Performing: Morley Kos    Procedure: Insertion of Arterial Line (40981) with US guidance (19147)   Indication(s) Blood pressure monitoring and/or need for frequent ABGs  Consent Risks of the procedure as well as the alternatives and risks of each were explained to the patient and/or caregiver.  Consent for the procedure was obtained and is signed in the bedside chart  Anesthesia lido   Time Out Verified patient identification, verified procedure, site/side was marked, verified correct patient position, special equipment/implants available, medications/allergies/relevant history reviewed, required imaging and test results available.   Sterile Technique Maximal sterile technique including full sterile barrier drape, hand hygiene, sterile gown, sterile gloves, mask, hair covering, sterile ultrasound probe cover (if used).   Procedure Description Area of catheter insertion was cleaned with chlorhexidine and draped in sterile fashion. With real-time ultrasound guidance an arterial catheter was placed into the right radial artery.  Appropriate arterial tracings confirmed on monitor.     Complications/Tolerance None; patient tolerated the procedure well.   EBL Minimal   Specimen(s) None

## 2022-10-01 NOTE — H&P (Addendum)
     Hospital Admission History and Physical Service Pager: [phone]  Patient name: Gloria Gallagher Medical record number: [id number] Date of Birth: [birth year] Age: 72 y.o. Gender: female  Primary Care Provider: Hillery Aldo, NP Consultants: Nephrology, PCCM Code Status: Partial Code- DNI Preferred Emergency Contact: Gloria Gallagher Information     Name Relation Home Work [place] Daughter [phone]  [phone]   Harland German   [phone]   payne,stacey Daughter   [phone]       At bedside for admission, patient requested BiPAP to be removed.  She desaturated to 80% after about 30 seconds - 1 minute off of BiPAP, and became diaphoretic and lethargic.  Placed back on BiPAP with improvement in mentation and oxygen saturation.   After replacing BiPAP, she states she would NOT desire intubation, but does wish for chest compressions, medications, shock if necessary for resuscitation.  I discussed these findings with PCCM, who agreed to admit patient to ICU.  FMTS will resume care when appropriate for floor status.  Chief Complaint: Shortness of breath  Assessment and Plan: Gloria Gallagher is a 72 y.o. female presenting with shortness of breath after completing HD today. Of note, recently diagnosed with pulmonary edema. Differential for this patient's presentation of this includes pulmonaryHTN, pulmonary edema, CHF, PE, MI, aortic stenosis, pulmonary hypertension, pneumonia or other infectious etiology.   PMH: ESRD on HD (MWF), HTN, aortic insufficiency (moderate to severe aortic insufficiency) treated medically as she is poor, CAD, atrial fibrillation s/p cardioversion but returned to a fib (now on amiodarone).   Disposition: ICU  Objective: BP (!) 113/90   Pulse 98   Temp 97.7 F (36.5 C) (Oral)   Resp 16   SpO2 96%  Exam: General: Ill-appearing, wearing BiPAP Eyes: No scleral icterus, pupils  PERRLA ENTM: Poor dentition Neck: Tugging with inspiration Cardiovascular: Tachycardic to 100s Respiratory: Increased work of breathing Gastrointestinal: Soft, thin MSK: No joint deformities Derm: No visible lesions or rashes Neuro: A&O x4 on BiPAP, when removed becomes sleepy with eyes closed    Gloria Dash, DO 10/01/2022, 4:19 PM PGY-2, [facility]

## 2022-10-01 NOTE — Progress Notes (Signed)
RT set up BIPAP and placed on pt. Pt is tolerating settings well. RT will continue to monitor as needed.

## 2022-10-01 NOTE — Progress Notes (Signed)
Patient transported on servo air bipap from ED Room 11 to 2H13 with no complications noted.

## 2022-10-01 NOTE — Progress Notes (Signed)
Gloria Gallagher returns back to the ED today d/t ongoing dyspnea. Seen and examined patient at bedside in ED. Per patient's sisters, she received her routine HD today (full treatment). When returning home, she then became SOB and EMS was called. Patient is now on Bipap. Respiratory panel (COVID, influenza, RSV) are negative. Current CXR shows no effusion and improving left lower lobe airspace disease. Plan for extra HD tonight (sequential: 2hrs UF and 1hr HD) and will re-assess her tomorrow AM. Will do a formal consult if patient is admitted to inpatient.  Hemodialysis Orders: Gloria Gallagher, Gloria Gallagher  Salome Holmes, NP [employer]

## 2022-10-01 NOTE — ED Triage Notes (Signed)
Pt BIB [facility] from home d/t SOB that has been going on several days.pt was diagnosed w pulmonary edema last week. Pt had  her full treatment today, but still feeling SOB afterwards. Pt does not wear O2 at baseline. A&O X4.

## 2022-10-01 NOTE — Progress Notes (Addendum)
eLink Physician-Brief Progress Note Patient Name: Gloria Gallagher DOB: 09-14-1950 MRN: [id number]   Date of Service  10/01/2022  HPI/Events of Note  72 year old female presenting with shortness of breath in the setting of pulmonary hypertension, pulmonary edema, heart failure, known PE and MI and aortic stenosis.  She was undergoing dialysis, completed her session desaturated to 80% off of BiPAP in the emergency department but was tachypneic, using saturation and generally fluid overloaded.  Patient admitted to critical care with plans for emergent dialysis and the left/right heart cath.   On presentation, she was tachypneic and tachycardic but normotensive.  She was started on 4 L nasal cannula and escalated to BiPAP therapy.  Initial metabolic panel shows mild hyponatremia, hypoalbuminemia, mildly elevated BNP, stable troponin, and mild anemia.  Chest radiograph with diminished left lower lobe, EKG reviewed with no acute ischemic changes provide persistent A-fib.   eICU Interventions  No obvious bleeding or infection.  Agree with urgent dialysis.  As long as sustained rates less than 120, can maintain amiodarone oral.  GI prophylaxis not indicated.  DVT prophylaxis with apixaban.  No immediate intervention indicated.   2001 - change Amio to IV, significant hypoxemia with removal 2102 -reviewed results of CT PE, concern for large pericardial effusion without significant parenchymal disease.  No obvious PE.  In the setting, will call cardiology for consultation.  May need more urgent echocardiography and potentially intervention in the setting of her respiratory failure.  2201 -becoming hypotensive, MAP of 53.  Cardiology eval pending.  Will add PIV levo for now.  A-line requested.  2224 -having some increased abdominal discomfort and GERD like symptoms.  Would like to try oral Mylanta, but patient is not tolerating p.o. well.  Instead, add IV pantoprazole.  Intervention  Category Evaluation Type: New Patient Evaluation  Gloria Gallagher 10/01/2022, 7:11 PM

## 2022-10-01 NOTE — ED Provider Notes (Signed)
[place] EMERGENCY DEPARTMENT AT [facility] Provider Note   CSN: [id number] Arrival date & time: 10/01/22  1259     History  Chief Complaint  Patient presents with   Shortness of Breath    Gloria Gallagher is a 72 y.o. female.   Shortness of Breath Patient presents for shortness of breath.  Medical history includes atrial fibrillation, ESRD, anemia, gout, GERD, CAD.  She was seen in the ED 6 days ago for left-sided chest pain, exertional dyspnea, and orthopnea.  She was treated with Tylenol and Toradol.  PE study showed no evidence of VTE, however, did show ectasia of main pulmonary artery suggestive of pulmonary arterial hypertension and reflux of contrast in hepatic veins suggesting tricuspid incompetence.  Today, patient reports that her shortness of breath has worsened over the past several days.  She has been adherent to her dialysis schedule, and did receive a full session today.     Home Medications Prior to Admission medications   Medication Sig Start Date End Date Taking? Authorizing Provider  acetaminophen (TYLENOL) 500 MG tablet Take 2 tablets (1,000 mg total) by mouth every 6 (six) hours as needed for mild pain or moderate pain. 01/19/22   Alfredo Martinez, MD  amiodarone (PACERONE) 200 MG tablet Take 1 tablet (200 mg total) by mouth daily. 10/19/21   Fenton, Clint R, PA  apixaban (ELIQUIS) 5 MG TABS tablet TAKE 1 TABLET BY MOUTH TWICE A DAY 06/22/22   Nahser, Deloris Ping, MD  atorvastatin (LIPITOR) 20 MG tablet Take 20 mg by mouth daily. 12/05/21   [provider]  diclofenac Sodium (VOLTAREN) 1 % GEL Apply 4 g topically 4 (four) times daily. 01/14/22   Melene Plan, DO  Doxercalciferol (HECTOROL IV) Inject 1 Dose into the vein every Monday, Wednesday, and Friday with hemodialysis. During dialysis, 3X week 11/08/21 11/07/22  [provider]  ferric citrate (AURYXIA) 1 GM 210 MG(Fe) tablet Take 1 tablet (210 mg total) by mouth 3 (three) times daily with  meals. 01/19/22   Alfredo Martinez, MD  oxyCODONE-acetaminophen (PERCOCET/ROXICET) 5-325 MG tablet Take 1 tablet by mouth every 6 (six) hours as needed. 02/16/22   Lars Mage, PA-C  Urea (UREA NAIL) 45 % GEL Apply to affected nail daily 08/08/22   Louann Sjogren, DPM      Allergies    Penicillin g and Shrimp (diagnostic)    Review of Systems   Review of Systems  Constitutional:  Positive for fatigue.  Respiratory:  Positive for shortness of breath.   All other systems reviewed and are negative.   Physical Exam Updated Vital Signs BP (!) 113/90   Pulse 98   Temp 97.7 F (36.5 C) (Oral)   Resp 16   SpO2 96%  Physical Exam Vitals and nursing note reviewed.  Constitutional:      General: She is not in acute distress.    Appearance: She is well-developed. She is ill-appearing. She is not toxic-appearing or diaphoretic.  HENT:     Head: Normocephalic and atraumatic.     Mouth/Throat:     Mouth: Mucous membranes are moist.  Eyes:     Extraocular Movements: Extraocular movements intact.     Conjunctiva/sclera: Conjunctivae normal.  Neck:     Vascular: JVD present.  Cardiovascular:     Rate and Rhythm: Normal rate and regular rhythm.     Heart sounds: No murmur heard. Pulmonary:     Effort: Tachypnea and accessory muscle usage present. No respiratory distress.  Breath sounds: Rales present. No wheezing.  Abdominal:     Palpations: Abdomen is soft.     Tenderness: There is no abdominal tenderness.  Musculoskeletal:        General: No swelling.     Cervical back: Neck supple.     Right lower leg: No edema.     Left lower leg: No edema.  Skin:    General: Skin is warm and dry.     Coloration: Skin is not cyanotic or pale.  Neurological:     General: No focal deficit present.     Mental Status: She is alert and oriented to person, place, and time.  Psychiatric:        Mood and Affect: Mood normal.        Behavior: Behavior normal.     ED Results / Procedures /  Treatments   Labs (all labs ordered are listed, but only abnormal results are displayed) Labs Reviewed  COMPREHENSIVE METABOLIC PANEL - Abnormal; Notable for the following components:      Result Value   Chloride 90 (*)    Glucose, Bld 121 (*)    Creatinine, Ser 4.62 (*)    Calcium 8.4 (*)    Albumin 3.2 (*)    GFR, Estimated 9 (*)    Anion gap 17 (*)    All other components within normal limits  CBC WITH DIFFERENTIAL/PLATELET - Abnormal; Notable for the following components:   RBC 3.86 (*)    Hemoglobin 11.7 (*)    Monocytes Absolute 1.1 (*)    Abs Immature Granulocytes 0.10 (*)    All other components within normal limits  BRAIN NATRIURETIC PEPTIDE - Abnormal; Notable for the following components:   B Natriuretic Peptide 473.5 (*)    All other components within normal limits  I-STAT CHEM 8, ED - Abnormal; Notable for the following components:   Sodium 134 (*)    Chloride 91 (*)    BUN 25 (*)    Creatinine, Ser 4.70 (*)    Glucose, Bld 123 (*)    Calcium, Ion 0.95 (*)    TCO2 37 (*)    All other components within normal limits  I-STAT VENOUS BLOOD GAS, ED - Abnormal; Notable for the following components:   pH, Ven 7.460 (*)    pO2, Ven 26 (*)    Bicarbonate 36.2 (*)    TCO2 38 (*)    Acid-Base Excess 11.0 (*)    Sodium 134 (*)    Calcium, Ion 0.95 (*)    All other components within normal limits  TROPONIN I (HIGH SENSITIVITY) - Abnormal; Notable for the following components:   Troponin I (High Sensitivity) 43 (*)    All other components within normal limits  TROPONIN I (HIGH SENSITIVITY) - Abnormal; Notable for the following components:   Troponin I (High Sensitivity) 43 (*)    All other components within normal limits  RESP PANEL BY RT-PCR (RSV, FLU A&B, COVID)  RVPGX2  MAGNESIUM  HEPATITIS B SURFACE ANTIGEN  HEPATITIS B SURFACE ANTIBODY, QUANTITATIVE    EKG None  Radiology DG Chest Port 1 View  Result Date: 10/01/2022 CLINICAL DATA:  dyspnea EXAM:  PORTABLE CHEST - 1 VIEW COMPARISON:  09/25/2022 FINDINGS: Interstitial prominence with pulmonary opacities improved at the left lung base, slightly increased laterally in the right mid lung. Stable cardiomegaly. No effusion. Chronic calcifications inferior to the distal left scapula. IMPRESSION: Improving left lower lobe airspace disease. Electronically Signed   By: Corlis Leak  M.D.   On: 10/01/2022 14:03    Procedures Procedures    Medications Ordered in ED Medications  Chlorhexidine Gluconate Cloth 2 % PADS 6 each (has no administration in time range)  pentafluoroprop-tetrafluoroeth (GEBAUERS) aerosol 1 Application (has no administration in time range)  lidocaine (PF) (XYLOCAINE) 1 % injection 5 mL (has no administration in time range)  lidocaine-prilocaine (EMLA) cream 1 Application (has no administration in time range)  heparin injection 1,000 Units (has no administration in time range)  alteplase (CATHFLO ACTIVASE) injection 2 mg (has no administration in time range)    ED Course/ Medical Decision Making/ A&P                             Medical Decision Making Amount and/or Complexity of Data Reviewed Labs: ordered. Radiology: ordered.   This patient presents to the ED for concern of shortness of breath, this involves an extensive number of treatment options, and is a complaint that carries with it a high risk of complications and morbidity.  The differential diagnosis includes pulmonary edema, pleural effusion, pericardial effusion, valvular dysfunction, pneumonia, reactive airway disease, PE, worsened pulmonary hypertension   Co morbidities that complicate the patient evaluation  atrial fibrillation, ESRD, anemia, gout, GERD, CAD   Additional history obtained:  Additional history obtained from patient's sister External records from outside source obtained and reviewed including EMR   Lab Tests:  I Ordered, and personally interpreted labs.  The pertinent results  include: Baseline anemia, no leukocytosis, elevated creatinine consistent with ESRD, normal electrolytes, BNP decreased from 6 days ago, baseline elevation in troponin, compensated hypercarbia on blood gas   Imaging Studies ordered:  I ordered imaging studies including chest x-ray I independently visualized and interpreted imaging which showed no acute findings I agree with the radiologist interpretation   Cardiac Monitoring: / EKG:  The patient was maintained on a cardiac monitor.  I personally viewed and interpreted the cardiac monitored which showed an underlying rhythm of: Atrial fibrillation   Consultations Obtained:  I requested consultation with the pulmonologist,  and discussed lab and imaging findings as well as pertinent plan - they recommend: Will see in consult   Problem List / ED Course / Critical interventions / Medication management  Patient presents for shortness of breath.  Prior to arrival, EMS was unable to obtain accurate SpO2.  She was placed on 4 L of supplemental oxygen.  She does not wear oxygen at baseline.  Patient reports several days of worsening shortness of breath.  This was not relieved by a full session of dialysis today.  Patient is anuric.  On exam, patient has increased work of breathing and tachypnea with respiratory rates in the range of 32.  SpO2 is 100% on 4 L of supplemental oxygen.  Given her increased work of breathing, BiPAP was ordered.  Diagnostic workup was initiated.  Lab work and chest x-ray show no concerning acute findings.  BNP has improved since 6 days ago.  On CTA from 6 days ago, patient did have evidence of pulmonary hypertension which may suggest decompensated right heart failure as a cause of her shortness of breath.  She was able to tolerate BiPAP well.  I spoke with pulmonology, who will see the patient in consult.  Patient was kept on BiPAP for comfort.  I spoke with family medicine who requested repeat CTA.  This was ordered.  Patient  was admitted for further management.   Social Determinants  of Health:  Access to outpatient care, lives at home with sister        Final Clinical Impression(s) / ED Diagnoses Final diagnoses:  SOB (shortness of breath)    Rx / DC Orders ED Discharge Orders     None         Gloris Manchester, MD 10/01/22 1648

## 2022-10-02 ENCOUNTER — Encounter (HOSPITAL_COMMUNITY): Payer: Self-pay | Admitting: Cardiovascular Disease

## 2022-10-02 ENCOUNTER — Inpatient Hospital Stay (HOSPITAL_COMMUNITY): Payer: 59

## 2022-10-02 ENCOUNTER — Ambulatory Visit: Payer: 59 | Admitting: Podiatry

## 2022-10-02 ENCOUNTER — Encounter (HOSPITAL_COMMUNITY)
Admission: EM | Disposition: A | Discharge status: Home Health | Payer: Self-pay | Source: Home / Self Care | Attending: Internal Medicine

## 2022-10-02 DIAGNOSIS — I3139 Other pericardial effusion (noninflammatory): Secondary | ICD-10-CM | POA: Diagnosis not present

## 2022-10-02 DIAGNOSIS — R579 Shock, unspecified: Secondary | ICD-10-CM | POA: Diagnosis not present

## 2022-10-02 DIAGNOSIS — I314 Cardiac tamponade: Secondary | ICD-10-CM | POA: Diagnosis not present

## 2022-10-02 DIAGNOSIS — J9601 Acute respiratory failure with hypoxia: Secondary | ICD-10-CM | POA: Diagnosis not present

## 2022-10-02 HISTORY — PX: PERICARDIOCENTESIS: CATH118255

## 2022-10-02 LAB — POCT I-STAT 7, (LYTES, BLD GAS, ICA,H+H)
Acid-Base Excess: 1 mmol/L (ref 0.0–2.0)
Bicarbonate: 25.6 mmol/L (ref 20.0–28.0)
Calcium, Ion: 1.05 mmol/L — ABNORMAL LOW (ref 1.15–1.40)
HCT: 34 % — ABNORMAL LOW (ref 36.0–46.0)
Hemoglobin: 11.6 g/dL — ABNORMAL LOW (ref 12.0–15.0)
O2 Saturation: 100 %
Potassium: 3.8 mmol/L (ref 3.5–5.1)
Sodium: 132 mmol/L — ABNORMAL LOW (ref 135–145)
TCO2: 27 mmol/L (ref 22–32)
pCO2 arterial: 40.1 mmHg (ref 32–48)
pH, Arterial: 7.413 (ref 7.35–7.45)
pO2, Arterial: 463 mmHg — ABNORMAL HIGH (ref 83–108)

## 2022-10-02 LAB — BODY FLUID CELL COUNT WITH DIFFERENTIAL
Eos, Fluid: 0 %
Lymphs, Fluid: 9 %
Monocyte-Macrophage-Serous Fluid: 11 % — ABNORMAL LOW (ref 50–90)
Neutrophil Count, Fluid: 80 % — ABNORMAL HIGH (ref 0–25)
Total Nucleated Cell Count, Fluid: 2035 cu mm — ABNORMAL HIGH (ref 0–1000)

## 2022-10-02 LAB — RENAL FUNCTION PANEL
Albumin: 2.8 g/dL — ABNORMAL LOW (ref 3.5–5.0)
Anion gap: 19 — ABNORMAL HIGH (ref 5–15)
BUN: 26 mg/dL — ABNORMAL HIGH (ref 8–23)
CO2: 23 mmol/L (ref 22–32)
Calcium: 8.6 mg/dL — ABNORMAL LOW (ref 8.9–10.3)
Chloride: 92 mmol/L — ABNORMAL LOW (ref 98–111)
Creatinine, Ser: 4.81 mg/dL — ABNORMAL HIGH (ref 0.44–1.00)
GFR, Estimated: 9 mL/min — ABNORMAL LOW (ref >60)
Glucose, Bld: 180 mg/dL — ABNORMAL HIGH (ref 70–99)
Phosphorus: 4.1 mg/dL (ref 2.5–4.6)
Potassium: 3.8 mmol/L (ref 3.5–5.1)
Sodium: 134 mmol/L — ABNORMAL LOW (ref 135–145)

## 2022-10-02 LAB — ECHOCARDIOGRAM COMPLETE
Area-P 1/2: 2.9 cm2
S' Lateral: 2.9 cm

## 2022-10-02 LAB — MAGNESIUM: Magnesium: 1.8 mg/dL (ref 1.7–2.4)

## 2022-10-02 LAB — CBC
HCT: 33.1 % — ABNORMAL LOW (ref 36.0–46.0)
Hemoglobin: 10.5 g/dL — ABNORMAL LOW (ref 12.0–15.0)
MCH: 30.4 pg (ref 26.0–34.0)
MCHC: 31.7 g/dL (ref 30.0–36.0)
MCV: 95.9 fL (ref 80.0–100.0)
Platelets: 236 10*3/uL (ref 150–400)
RBC: 3.45 MIL/uL — ABNORMAL LOW (ref 3.87–5.11)
RDW: 14.6 % (ref 11.5–15.5)
WBC: 9.3 10*3/uL (ref 4.0–10.5)
nRBC: 0 % (ref 0.0–0.2)

## 2022-10-02 LAB — GRAM STAIN

## 2022-10-02 LAB — COOXEMETRY PANEL
Carboxyhemoglobin: 1.4 % (ref 0.5–1.5)
Methemoglobin: 0.7 % (ref 0.0–1.5)
O2 Saturation: 63.1 %
Total hemoglobin: 10.5 g/dL — ABNORMAL LOW (ref 12.0–16.0)

## 2022-10-02 LAB — BRAIN NATRIURETIC PEPTIDE: B Natriuretic Peptide: 546.5 pg/mL — ABNORMAL HIGH (ref 0.0–100.0)

## 2022-10-02 LAB — ECHOCARDIOGRAM LIMITED: P 1/2 time: 503 msec

## 2022-10-02 SURGERY — PERICARDIOCENTESIS
Anesthesia: LOCAL

## 2022-10-02 MED ORDER — SODIUM CHLORIDE 0.9 % IV BOLUS: 250.0000 mL | Freq: Once | INTRAVENOUS | Status: DC

## 2022-10-02 MED ORDER — MIDAZOLAM HCL 2 MG/2ML IJ SOLN
INTRAMUSCULAR | Status: AC
  Filled 2022-10-02: qty 2

## 2022-10-02 MED ORDER — ALBUMIN HUMAN 5 % IV SOLN
25.0000 g | Freq: Once | INTRAVENOUS | Status: AC
  Administered 2022-10-02: 25 g via INTRAVENOUS
  Filled 2022-10-02: qty 500

## 2022-10-02 MED ORDER — NOREPINEPHRINE 16 MG/250ML-% IV SOLN
2.0000 ug/min | INTRAVENOUS | Status: DC
  Administered 2022-10-02: 10 ug/min via INTRAVENOUS
  Filled 2022-10-02: qty 250

## 2022-10-02 MED ORDER — FENTANYL CITRATE PF 50 MCG/ML IJ SOSY
50.0000 ug | PREFILLED_SYRINGE | Freq: Once | INTRAMUSCULAR | Status: AC
  Administered 2022-10-02: 50 ug via INTRAVENOUS

## 2022-10-02 MED ORDER — VANCOMYCIN HCL 1500 MG/300ML IV SOLN
1500.0000 mg | Freq: Once | INTRAVENOUS | Status: AC
  Administered 2022-10-02: 1500 mg via INTRAVENOUS
  Filled 2022-10-02: qty 300

## 2022-10-02 MED ORDER — ORAL CARE MOUTH RINSE: 15.0000 mL | OROMUCOSAL | Status: DC | PRN

## 2022-10-02 MED ORDER — NOREPINEPHRINE 16 MG/250ML-% IV SOLN: 0.0000 ug/min | INTRAVENOUS | Status: DC

## 2022-10-02 MED ORDER — ONDANSETRON HCL 4 MG/2ML IJ SOLN
INTRAMUSCULAR | Status: AC
  Filled 2022-10-02: qty 2

## 2022-10-02 MED ORDER — FENTANYL CITRATE PF 50 MCG/ML IJ SOSY: 25.0000 ug | PREFILLED_SYRINGE | INTRAMUSCULAR | Status: DC | PRN

## 2022-10-02 MED ORDER — ALBUMIN HUMAN 5 % IV SOLN
INTRAVENOUS | Status: AC
  Filled 2022-10-02: qty 250

## 2022-10-02 MED ORDER — SODIUM CHLORIDE 0.9 % IV BOLUS
250.0000 mL | Freq: Once | INTRAVENOUS | Status: AC
  Administered 2022-10-02: 250 mL via INTRAVENOUS

## 2022-10-02 MED ORDER — POLYETHYLENE GLYCOL 3350 17 G PO PACK
17.0000 g | PACK | Freq: Every day | ORAL | Status: DC
  Administered 2022-10-03: 17 g
  Filled 2022-10-02: qty 1

## 2022-10-02 MED ORDER — SODIUM CHLORIDE 0.9% FLUSH
3.0000 mL | Freq: Two times a day (BID) | INTRAVENOUS | Status: DC
  Administered 2022-10-02 – 2022-10-06 (×9): 3 mL via INTRAVENOUS

## 2022-10-02 MED ORDER — HYDROCORTISONE SOD SUC (PF) 100 MG IJ SOLR
100.0000 mg | Freq: Two times a day (BID) | INTRAMUSCULAR | Status: DC
  Administered 2022-10-02 (×3): 100 mg via INTRAVENOUS
  Filled 2022-10-02 (×3): qty 2

## 2022-10-02 MED ORDER — SODIUM CHLORIDE 0.9 % IV SOLN
12.5000 mg | Freq: Four times a day (QID) | INTRAVENOUS | Status: DC | PRN
  Administered 2022-10-02: 12.5 mg via INTRAVENOUS
  Filled 2022-10-02 (×2): qty 0.5

## 2022-10-02 MED ORDER — ETOMIDATE 2 MG/ML IV SOLN
INTRAVENOUS | Status: AC
  Filled 2022-10-02: qty 20

## 2022-10-02 MED ORDER — LABETALOL HCL 5 MG/ML IV SOLN: 10.0000 mg | INTRAVENOUS | Status: AC | PRN

## 2022-10-02 MED ORDER — SODIUM CHLORIDE 0.9 % IV SOLN: 250.0000 mL | INTRAVENOUS | Status: DC | PRN

## 2022-10-02 MED ORDER — SODIUM CHLORIDE 0.9% FLUSH: 3.0000 mL | INTRAVENOUS | Status: DC | PRN

## 2022-10-02 MED ORDER — ROCURONIUM BROMIDE 10 MG/ML (PF) SYRINGE
PREFILLED_SYRINGE | INTRAVENOUS | Status: AC
  Filled 2022-10-02: qty 10

## 2022-10-02 MED ORDER — HYDRALAZINE HCL 20 MG/ML IJ SOLN: 10.0000 mg | INTRAMUSCULAR | Status: AC | PRN

## 2022-10-02 MED ORDER — MIDAZOLAM HCL 2 MG/2ML IJ SOLN
2.0000 mg | Freq: Once | INTRAMUSCULAR | Status: AC
  Administered 2022-10-02: 2 mg via INTRAVENOUS

## 2022-10-02 MED ORDER — LIDOCAINE HCL (PF) 1 % IJ SOLN
INTRAMUSCULAR | Status: DC | PRN
  Administered 2022-10-02: 10 mL via INTRADERMAL

## 2022-10-02 MED ORDER — FENTANYL 2500MCG IN NS 250ML (10MCG/ML) PREMIX INFUSION
0.0000 ug/h | INTRAVENOUS | Status: DC
  Filled 2022-10-02: qty 250

## 2022-10-02 MED ORDER — IOHEXOL 350 MG/ML SOLN
INTRAVENOUS | Status: DC | PRN
  Administered 2022-10-02: 10 mL via INTRACARDIAC

## 2022-10-02 MED ORDER — ETOMIDATE 2 MG/ML IV SOLN
10.0000 mg | Freq: Once | INTRAVENOUS | Status: AC
  Administered 2022-10-02: 10 mg via INTRAVENOUS

## 2022-10-02 MED ORDER — SODIUM CHLORIDE 0.9 % IV SOLN
1.0000 g | Freq: Once | INTRAVENOUS | Status: AC
  Administered 2022-10-02: 1 g via INTRAVENOUS
  Filled 2022-10-02: qty 10

## 2022-10-02 MED ORDER — VASOPRESSIN 20 UNITS/100 ML INFUSION FOR SHOCK
0.0000 [IU]/min | INTRAVENOUS | Status: DC
  Administered 2022-10-02: 0.03 [IU]/min via INTRAVENOUS
  Filled 2022-10-02: qty 100

## 2022-10-02 MED ORDER — LIDOCAINE HCL (PF) 1 % IJ SOLN
INTRAMUSCULAR | Status: AC
  Filled 2022-10-02: qty 30

## 2022-10-02 MED ORDER — ROCURONIUM BROMIDE 10 MG/ML (PF) SYRINGE
50.0000 mg | PREFILLED_SYRINGE | Freq: Once | INTRAVENOUS | Status: AC
  Administered 2022-10-02: 50 mg via INTRAVENOUS

## 2022-10-02 MED ORDER — FENTANYL CITRATE PF 50 MCG/ML IJ SOSY
PREFILLED_SYRINGE | INTRAMUSCULAR | Status: AC
  Filled 2022-10-02: qty 1

## 2022-10-02 MED ORDER — DOCUSATE SODIUM 100 MG PO CAPS
100.0000 mg | ORAL_CAPSULE | Freq: Two times a day (BID) | ORAL | Status: DC
  Administered 2022-10-03 – 2022-10-06 (×6): 100 mg via ORAL
  Filled 2022-10-02 (×8): qty 1

## 2022-10-02 MED ORDER — VANCOMYCIN HCL 750 MG/150ML IV SOLN
750.0000 mg | INTRAVENOUS | Status: DC
  Filled 2022-10-02: qty 150

## 2022-10-02 MED ORDER — DOCUSATE SODIUM 50 MG/5ML PO LIQD: 100.0000 mg | Freq: Two times a day (BID) | ORAL | Status: DC

## 2022-10-02 MED ORDER — DEXMEDETOMIDINE HCL IN NACL 400 MCG/100ML IV SOLN: 0.0000 ug/kg/h | INTRAVENOUS | Status: DC

## 2022-10-02 MED ORDER — SODIUM CHLORIDE 0.9 % IV SOLN
1.0000 g | INTRAVENOUS | Status: DC
  Administered 2022-10-02: 1 g via INTRAVENOUS
  Filled 2022-10-02 (×2): qty 10

## 2022-10-02 SURGICAL SUPPLY — 8 items
ELECT DEFIB PAD ADLT CADENCE (PAD) IMPLANT
KIT MICROPUNCTURE NIT STIFF (SHEATH) IMPLANT
PACK CARDIAC CATHETERIZATION (CUSTOM PROCEDURE TRAY) IMPLANT
SHEATH PROBE COVER 6X72 (BAG) IMPLANT
TRAY PERICARDIOCENTESIS 6FX60 (TRAY / TRAY PROCEDURE) IMPLANT
TUBING ART PRESS 72  MALE/FEM (TUBING) ×1
TUBING ART PRESS 72 MALE/FEM (TUBING) IMPLANT
TUBING CIL FLEX 10 FLL-RA (TUBING) IMPLANT

## 2022-10-02 NOTE — Procedures (Signed)
Extubation Procedure Note  Patient Details:   Name: Gloria Gallagher DOB: [birth year] MRN: [id number]   Airway Documentation:    Vent end date: 10/02/22 Vent end time: 1207   Evaluation  O2 sats: stable throughout Complications: No apparent complications Patient did tolerate procedure well. Bilateral Breath Sounds: Diminished   Yes  Pt extubated per MD order, placed on 4L [place]. Positive cuff leak noted, no stridor heard. Vitals stable throughout. RT will continue to monitor.   Vicente Masson 10/02/2022, 12:32 PM

## 2022-10-02 NOTE — Progress Notes (Signed)
   10/02/22 0030  Vitals  BP Method Automatic  Patient Position (if appropriate) Lying  Pulse Rate Source Monitor  Oxygen Therapy  O2 Device Bi-PAP  Patient Activity (if Appropriate) In bed  Pulse Oximetry Type Continuous  During Treatment Monitoring  Blood Flow Rate (mL/min) 200 mL/min  HD Safety Checks Performed Yes  Intra-Hemodialysis Comments Tx completed  Post Treatment  Dialyzer Clearance Lightly streaked  Duration of HD Treatment -hour(s) 2.6 hour(s)  Hemodialysis Intake (mL) 100 mL  Liters Processed 57.9  Fluid Removed (mL) 3000 mL  Tolerated HD Treatment Yes (with Levophed for B/P support)  Post-Hemodialysis Comments Multiple procedures during the dialysis treatment. Pt became hypotensive, without symptoms. N/S bolus given, with good effect.  AVG/AVF Arterial Site Held (minutes) 10 minutes  AVG/AVF Venous Site Held (minutes) 10 minutes  Note  Observations Patient is   Received patient in bed - yes Alert and oriented. - x 4 Informed consent signed and in chart. - yes  TX duration: 2.60  Patient tolerated well. - yes  Alert, without acute distress. - yes Hand-off given to patient's nurse. - yes, John  Access used: Lt AVF Access issues: No  Total UF removed: 3000 mls Medication(s) given: None - patient refused Heparin bolus Post HD VS: see flowsheet Art line vitals Post HD weight: 61.8 kg   Emmalia Heyboer [facility]

## 2022-10-02 NOTE — Progress Notes (Signed)
Echocardiogram 2D Echocardiogram has been performed.  Warren Lacy Jemery Stacey RDCS 10/02/2022, 8:23 AM  Critical results communicated to Dr Katrinka Blazing at bedside

## 2022-10-02 NOTE — Progress Notes (Signed)
Pt transported to CATH LAB and back to 2H13 on ventilator without any complications.

## 2022-10-02 NOTE — Progress Notes (Signed)
NAME:  Gloria Gallagher, MRN:  [id number], DOB:  April 21, 1951, LOS: 1 ADMISSION DATE:  10/01/2022, CONSULTATION DATE:  5/20 REFERRING MD:  Dr. Gloris Manchester, CHIEF COMPLAINT:  Dyspnea  History of Present Illness:  Gloria Gallagher. Racer is a 72 year old female with PMHx of ESRD on HD (MWF), HTN, aortic insufficiency (mod-severe), afib, anemia, gout, and GERD who presented to the ED with SOB after desatting to 80% undergoing HD. Presented to ED 6 days prior with similar complaints of worsening DOE, orthopnea, and left-sided chest pain.   On ED arrival, was tachycardiac and tachypneic but normotensive on 4L [place]. Progressed to BiPAP with continued use of accessory muscles and work of breathing. CT chest on 5/14 was negative for PE, but had marked ectasia of main pulm artery suggesting pHTN with reflux into hepatic veins. 5/20 ED eval pertinent for CXR with left basilar volume loss, BNP 473, Na 134, Hgb 11.7, and stable trop. Presentation concerning for pericardial effusion and admitted to ICU for evaluation and management of acute hypoxic respiratory failure.  Pertinent  Medical History  ESRD with HD (MWF) Afib Aortic insufficiency (mod to severe) HTN HLD Anemia Gout GERD CAD Hyperparathyroidism Neuropathy Seasonal allergies  Significant Hospital Events: Including procedures, antibiotic start and stop dates in addition to other pertinent events   5/20 presented to ED w/ cc SOB in spite of full iHD treatment. Hypotensive overnight (MAP 53), requiring 2 pressor shock during emergent dialysis. Started on vanc/cefepime. Unable to wean off BiPAP  Interim History / Subjective:  Weaned to 2L via [place] and no pressors early this AM, but unable to maintain. Required BiPAP and pressors again with worsening respiratory status. Patient alert and uncomfortable, requiring upright position. Echocardiogram with large pericardial effusion and diastolic collapse of RV and RA evident of cardiac tamponade. Nauseated on  BIPAP and requiring intubation.   Objective   Blood pressure (!) 53/34, pulse (S) 93, temperature (!) 96.9 F (36.1 C), temperature source Axillary, resp. rate (!) 23, SpO2 100 %.    Vent Mode: PRVC FiO2 (%):  [50 %-100 %] 100 % Set Rate:  [20 bmp] 20 bmp Vt Set:  [450 mL] 450 mL PEEP:  [5 cmH20] 5 cmH20 Plateau Pressure:  [20 cmH20] 20 cmH20   Intake/Output Summary (Last 24 hours) at 10/02/2022 1112 Last data filed at 10/02/2022 1100 Gross per 24 hour  Intake 1687.34 ml  Output 3000 ml  Net -1312.66 ml   There were no vitals filed for this visit.  Examination: General: Ill-appearing and in distress, sitting upright and uncomfortable on BiPAP HENT: NCAT, missing bottom teeth, BIPAP in place, Neck: +JVD Lungs: Coarse breath throughout, increased WOB, tachypneic Cardiovascular: RRR, soft murmur and rub appreciated Abdomen: Soft, non-tender Extremities: Warm, dry, no edema Neuro: No focal deficits appreciated Psych: Anxious mood and affect   Assessment & Plan:  Gloria Gallagher is a 72 year old female with PMHx of ESRD on HD (MWF), HTN, aortic insufficiency (mod-severe), afib, anemia, gout, and GERD who is admitted for management of acute hypoxic respiratory failure 2/2 cardiac tamponade  Obstructive shock  Cardiac tamponade Presented with shock with large pericardial effusion. After emergent HD, persistently hypotensive and requiring 2 pressors. Found to be in cardiac tamponade on echocardiogram this morning with diastolic collapse of RV and RA.  - S/p intubation and emergent pericardiocentesis. Drainage of 950 cc of serosanguinous fluid with improvement of MAP. - Subxiphoid catheter remains in place; CTM - May require further evaluation tomorrow ([facility]) depending  on pericardial offloading and response - Cardiology and nephrology following - Started on vanc/cefepime last night due to inability to exclude septic shock last night. Given cardiac physiology, unlikely, but will  alter abx pending clinical course.   Acute hypoxic respiratory failure  Respiratory failure likely secondary to cardiac tamponade. Nauseated on BiPAP, requiring intubation - extubate and wean supplemental O2 as appropriate  ESRD on HD - HD last night; follows MWF schedule  Afib  - Holding Eliquis - Amiodarone 30 mg/hr infusion  Aortic insuffiencey  Echo with moderate AV regurg and aortic dilation - CTM; cardiology following  HTN  HLD - home statin  Anemia Stable with Hgb 11.7 - home ferric citrate - CTM  Nausea  GERD Qtc 499 - no zofran - sch IV protonix and prn phenergan   Best Practice (right click and "Reselect all SmartList Selections" daily)   Diet/type: NPO w/ oral meds DVT prophylaxis: off for procedure GI prophylaxis: PPI Lines: yes and it is still needed Foley:  N/A Code Status:  full code  Labs   CBC: Recent Labs  Lab 10/01/22 1333 10/01/22 1338 10/02/22 0302 10/02/22 0315  WBC 8.7  --  9.3  --   NEUTROABS 6.6  --   --   --   HGB 11.7* 12.9  12.9 10.5* 11.6*  HCT 37.4 38.0  38.0 33.1* 34.0*  MCV 96.9  --  95.9  --   PLT 223  --  236  --     Basic Metabolic Panel: Recent Labs  Lab 10/01/22 1333 10/01/22 1338 10/02/22 0302 10/02/22 0315  NA 137 134*  134* 134* 132*  K 3.7 3.6  3.6 3.8 3.8  CL 90* 91* 92*  --   CO2 30  --  23  --   GLUCOSE 121* 123* 180*  --   BUN 19 25* 26*  --   CREATININE 4.62* 4.70* 4.81*  --   CALCIUM 8.4*  --  8.6*  --   MG 2.0  --  1.8  --   PHOS  --   --  4.1  --    GFR: Estimated Creatinine Clearance: 10.3 mL/min (A) (by C-G formula based on SCr of 4.81 mg/dL (H)). Recent Labs  Lab 10/01/22 1333 10/02/22 0302  WBC 8.7 9.3    Liver Function Tests: Recent Labs  Lab 10/01/22 1333 10/02/22 0302  AST 20  --   ALT 15  --   ALKPHOS 110  --   BILITOT 0.6  --   PROT 7.7  --   ALBUMIN 3.2* 2.8*   No results for input(s): "LIPASE", "AMYLASE" in the last 168 hours. No results for input(s):  "AMMONIA" in the last 168 hours.  ABG    Component Value Date/Time   PHART 7.413 10/02/2022 0315   PCO2ART 40.1 10/02/2022 0315   PO2ART 463 (H) 10/02/2022 0315   HCO3 25.6 10/02/2022 0315   TCO2 27 10/02/2022 0315   O2SAT 100 10/02/2022 0315     Coagulation Profile: No results for input(s): "INR", "PROTIME" in the last 168 hours.  Cardiac Enzymes: No results for input(s): "CKTOTAL", "CKMB", "CKMBINDEX", "TROPONINI" in the last 168 hours.  HbA1C: Hgb A1c MFr Bld  Date/Time Value Ref Range Status  01/18/2022 02:26 AM 5.3 4.8 - 5.6 % Final    Comment:    (NOTE) Pre diabetes:          5.7%-6.4%  Diabetes:              >  6.4%  Glycemic control for   <7.0% adults with diabetes     CBG: No results for input(s): "GLUCAP" in the last 168 hours.   Past Medical History:  She,  has a past medical history of Anemia, Aortic atherosclerosis (HCC), Arthritis, Atrial fibrillation (HCC), CAD (coronary artery disease), COVID (2021), Dialysis patient (HCC), ESRD on hemodialysis (HCC), GERD (gastroesophageal reflux disease), Gout, Heart murmur, Hyperparathyroidism (HCC), Hypertension, Neuromuscular disorder (HCC), Pneumonia, and Seasonal allergies.   Surgical History:   Past Surgical History:  Procedure Laterality Date   CARDIOVERSION N/A 05/16/2021   Procedure: CARDIOVERSION;  Surgeon: Nahser, Deloris Ping, MD;  Location[facility] ENDOSCOPY;  Service: Cardiovascular;  Laterality: N/A;   CARDIOVERSION N/A 08/01/2021   Procedure: CARDIOVERSION;  Surgeon: Thurmon Fair, MD;  Location: [facility] ENDOSCOPY;  Service: Cardiovascular;  Laterality: N/A;   CARDIOVERSION N/A 12/05/2021   Procedure: CARDIOVERSION;  Surgeon: Thomasene Ripple, DO;  Location: [facility] ENDOSCOPY;  Service: Cardiovascular;  Laterality: N/A;   DIALYSIS FISTULA CREATION Left    REVISON OF ARTERIOVENOUS FISTULA Left 02/16/2022   Procedure: LEFT ARM ARTERIOVENOUS FISTULA REVISION;  Surgeon: Leonie Douglas, MD;  Location: [facility] OR;  Service:  Vascular;  Laterality: Left;  PERIPHERAL NERVE BLOCK   TUBAL LIGATION       Social History:   reports that she has quit smoking. Her smoking use included cigarettes. She has never been exposed to tobacco smoke. She has never used smokeless tobacco. She reports current alcohol use of about 1.0 standard drink of alcohol per week. She reports that she does not use drugs.   Family History:  Her family history includes Diabetes in her sister. There is no history of Stomach cancer or Colon cancer.   Allergies Allergies  Allergen Reactions   Penicillin G Hives and Itching   Shrimp (Diagnostic) Hives     Home Medications  Prior to Admission medications   Medication Sig Start Date End Date Taking? Authorizing Provider  acetaminophen (TYLENOL) 500 MG tablet Take 2 tablets (1,000 mg total) by mouth every 6 (six) hours as needed for mild pain or moderate pain. 01/19/22   Alfredo Martinez, MD  amiodarone (PACERONE) 200 MG tablet Take 1 tablet (200 mg total) by mouth daily. 10/19/21   Fenton, Clint R, PA  apixaban (ELIQUIS) 5 MG TABS tablet TAKE 1 TABLET BY MOUTH TWICE A DAY 06/22/22   Nahser, Deloris Ping, MD  atorvastatin (LIPITOR) 20 MG tablet Take 20 mg by mouth daily. 12/05/21   [provider]  diclofenac Sodium (VOLTAREN) 1 % GEL Apply 4 g topically 4 (four) times daily. 01/14/22   Melene Plan, DO  Doxercalciferol (HECTOROL IV) Inject 1 Dose into the vein every Monday, Wednesday, and Friday with hemodialysis. During dialysis, 3X week 11/08/21 11/07/22  [provider]  ferric citrate (AURYXIA) 1 GM 210 MG(Fe) tablet Take 1 tablet (210 mg total) by mouth 3 (three) times daily with meals. 01/19/22   Alfredo Martinez, MD  oxyCODONE-acetaminophen (PERCOCET/ROXICET) 5-325 MG tablet Take 1 tablet by mouth every 6 (six) hours as needed. 02/16/22   Lars Mage, PA-C  Urea (UREA NAIL) 45 % GEL Apply to affected nail daily 08/08/22   Louann Sjogren, DPM    Adolph Pollack, MS4

## 2022-10-02 NOTE — Procedures (Signed)
Central Venous Catheter Insertion Procedure Note  Gloria Gallagher  [id number]  22-Feb-1951  Date:10/02/22  Time:12:16 AM   Provider Performing:Hildreth Orsak Celine Mans   Procedure: Insertion of Non-tunneled Central Venous [phone]) with US guidance ([id number])   Indication(s) Medication administration and Difficult access  Consent Risks of the procedure as well as the alternatives and risks of each were explained to the patient and/or caregiver.  Consent for the procedure was obtained and is signed in the bedside chart  Anesthesia Topical only with 1% lidocaine   Timeout Verified patient identification, verified procedure, site/side was marked, verified correct patient position, special equipment/implants available, medications/allergies/relevant history reviewed, required imaging and test results available.  Sterile Technique Maximal sterile technique including full sterile barrier drape, hand hygiene, sterile gown, sterile gloves, mask, hair covering, sterile ultrasound probe cover (if used).  Procedure Description Area of catheter insertion was cleaned with chlorhexidine and draped in sterile fashion.  With real-time ultrasound guidance a central venous catheter was placed into the left femoral vein. Nonpulsatile blood flow and easy flushing noted in all ports.  The catheter was sutured in place and sterile dressing applied.  Complications/Tolerance None; patient tolerated the procedure well. Chest X-ray is ordered to verify placement for internal jugular or subclavian cannulation.   Chest x-ray is not ordered for femoral cannulation.  EBL Minimal  Specimen(s) None   Attempted L subclavian as pt on BiPAP and had difficulty lying flat. Pt with low pain tolerance, very squirmy and jumpy.  Site aborted and lung assessed by POCUS, clear lung sliding noted. Moved to L IJ site. Pt worse as far as fidgety/squirmy so site aborted again. Lung apices assessed under US guidance again and  sliding lung noted. Moved to R IJ site and good vessel able to be cannulated but difficulty with passing guidewire with significant resistance.  Ultimately moved to R femoral with successful placement of triple lumen CVL.   Rutherford Guys, PA - C  Pulmonary & Critical Care Medicine For pager details, please see AMION or use Epic chat  After 1900, please call [facility] for cross coverage needs 10/02/2022, 12:21 AM

## 2022-10-02 NOTE — Progress Notes (Signed)
Daughter updated. Will wean to extubate

## 2022-10-02 NOTE — Progress Notes (Signed)
BIPAP on standby at bedside, pt tolerating 3L [place] comfortably with no increased work of breathing and stable vitals.

## 2022-10-02 NOTE — Progress Notes (Signed)
Rounding Note    Patient Name: Gloria Gallagher Date of Encounter: 10/02/2022  [facility] Cardiologist: Kristeen Miss, MD   Subjective   Patient nauseated, hemodynamically unstable on a nonrebreather about to be intubated on her way to the Cath Lab for emergency pericardiocentesis  Inpatient Medications    Scheduled Meds:  atorvastatin  20 mg Oral Daily   Chlorhexidine Gluconate Cloth  6 each Topical Q0600   ferric citrate  210 mg Oral TID WC   hydrocortisone sod succinate (SOLU-CORTEF) inj  100 mg Intravenous BID   ondansetron       pantoprazole (PROTONIX) IV  40 mg Intravenous QHS   Continuous Infusions:  sodium chloride     sodium chloride Stopped (10/02/22 0126)   albumin human     amiodarone 30 mg/hr (10/02/22 0800)   ceFEPime (MAXIPIME) IV     norepinephrine (LEVOPHED) Adult infusion     promethazine (PHENERGAN) injection (IM or IVPB) Stopped (10/02/22 0607)   [START ON 10/03/2022] vancomycin     vasopressin Stopped (10/02/22 0553)   PRN Meds: Place/Maintain arterial line **AND** sodium chloride, albumin human, alteplase, alum & mag hydroxide-simeth, heparin, lidocaine (PF), lidocaine-prilocaine, ondansetron, pentafluoroprop-tetrafluoroeth, promethazine (PHENERGAN) injection (IM or IVPB)   Vital Signs    Vitals:   10/02/22 0715 10/02/22 0730 10/02/22 0745 10/02/22 0800  BP:      Pulse: 92 84 89 88  Resp: 19 (!) 21 (!) 21 (!) 22  Temp:    (!) 97 F (36.1 C)  TempSrc:    Axillary  SpO2: 100% 96% 100% 100%    Intake/Output Summary (Last 24 hours) at 10/02/2022 0840 Last data filed at 10/02/2022 0800 Gross per 24 hour  Intake 1627.69 ml  Output 3000 ml  Net -1372.31 ml      09/25/2022    2:54 AM 03/20/2022    7:51 AM 03/13/2022    8:55 AM  Last 3 Weights  Weight (lbs) 156 lb 8.4 oz 157 lb 3.2 oz 150 lb  Weight (kg) 71 kg 71.305 kg 68.04 kg      Telemetry    A-fib with ventricular response in the 80s to 90s- Personally Reviewed  ECG     Atrial fibrillation with a ventricular sponsor 93- Personally Reviewed  Physical Exam   GEN: On nonrebreather and respiratory distress Neck: No JVD Cardiac: RRR, no murmurs, rubs, or gallops.  Respiratory: Clear to auscultation bilaterally. GI: Soft, nontender, non-distended  MS: No edema; No deformity. Neuro:  Nonfocal  Psych: Normal affect   Labs    High Sensitivity Troponin:   Recent Labs  Lab 09/25/22 0310 09/25/22 0510 10/01/22 1330 10/01/22 1333  TROPONINIHS 39* 37* 43* 43*     Chemistry Recent Labs  Lab 10/01/22 1333 10/01/22 1338 10/02/22 0302 10/02/22 0315  NA 137 134*  134* 134* 132*  K 3.7 3.6  3.6 3.8 3.8  CL 90* 91* 92*  --   CO2 30  --  23  --   GLUCOSE 121* 123* 180*  --   BUN 19 25* 26*  --   CREATININE 4.62* 4.70* 4.81*  --   CALCIUM 8.4*  --  8.6*  --   MG 2.0  --  1.8  --   PROT 7.7  --   --   --   ALBUMIN 3.2*  --  2.8*  --   AST 20  --   --   --   ALT 15  --   --   --  ALKPHOS 110  --   --   --   BILITOT 0.6  --   --   --   GFRNONAA 9*  --  9*  --   ANIONGAP 17*  --  19*  --     Lipids No results for input(s): "CHOL", "TRIG", "HDL", "LABVLDL", "LDLCALC", "CHOLHDL" in the last 168 hours.  Hematology Recent Labs  Lab 10/01/22 1333 10/01/22 1338 10/02/22 0302 10/02/22 0315  WBC 8.7  --  9.3  --   RBC 3.86*  --  3.45*  --   HGB 11.7* 12.9  12.9 10.5* 11.6*  HCT 37.4 38.0  38.0 33.1* 34.0*  MCV 96.9  --  95.9  --   MCH 30.3  --  30.4  --   MCHC 31.3  --  31.7  --   RDW 14.7  --  14.6  --   PLT 223  --  236  --    Thyroid No results for input(s): "TSH", "FREET4" in the last 168 hours.  BNP Recent Labs  Lab 10/01/22 1333 10/02/22 0302  BNP 473.5* 546.5*    DDimer No results for input(s): "DDIMER" in the last 168 hours.   Radiology    DG Chest Port 1 View  Result Date: 10/02/2022 CLINICAL DATA:  Acute respiratory failure. EXAM: PORTABLE CHEST 1 VIEW COMPARISON:  10/01/2022 FINDINGS: Marked enlargement of the  cardiopericardial silhouette, stable. Interstitial markings are diffusely coarsened with chronic features. Similar retrocardiac left base collapse/consolidation with small effusion. Bones are demineralized. Telemetry leads overlie the chest. IMPRESSION: 1. No substantial interval change. 2. Marked enlargement of the cardiopericardial silhouette, compatible with pericardial effusion is seen on CT scan yesterday. 3. Persistent retrocardiac left base collapse/consolidation with small effusion. Electronically Signed   By: Kennith Center M.D.   On: 10/02/2022 06:54   CT Angio Chest PE W and/or Wo Contrast  Result Date: 10/01/2022 CLINICAL DATA:  Concern for pulmonary embolism. EXAM: CT ANGIOGRAPHY CHEST WITH CONTRAST TECHNIQUE: Multidetector CT imaging of the chest was performed using the standard protocol during bolus administration of intravenous contrast. Multiplanar CT image reconstructions and MIPs were obtained to evaluate the vascular anatomy. RADIATION DOSE REDUCTION: This exam was performed according to the departmental dose-optimization program which includes automated exposure control, adjustment of the mA and/or kV according to patient size and/or use of iterative reconstruction technique. CONTRAST:  75mL OMNIPAQUE IOHEXOL 350 MG/ML SOLN COMPARISON:  Chest radiograph dated 10/01/2022 and CT dated 09/25/2022. FINDINGS: Evaluation of this exam is limited due to respiratory motion artifact. Cardiovascular: There is mild cardiomegaly. There has been interval development of a large pericardial effusion measuring up to 2.2 cm in thickness. Correlation with clinical exam recommended to evaluate for possibility of cardiac tamponade. Three vessel coronary vascular calcification. There is retrograde flow of contrast from the right atrium into the IVC suggestive of right heart dysfunction. There is moderate atherosclerotic calcification of the thoracic aorta. No aneurysmal dilatation. Dilatation of the main pulmonary  trunk suggestive of pulmonary hypertension. Evaluation of the pulmonary arteries is limited due to respiratory motion and suboptimal visualization of the peripheral branches. No central pulmonary artery embolus identified. Mediastinum/Nodes: No hilar or mediastinal adenopathy. The esophagus is grossly unremarkable. No mediastinal fluid collection. Lungs/Pleura: Background of emphysema. There is a 1 cm right lower lobe nodule (88/6). There are bibasilar atelectasis. Small left pleural effusion and left lung base atelectasis. Pneumonia is not excluded. Clinical correlation is recommended. There is no pneumothorax. The central airways are patent. Upper Abdomen:  Polycystic renal and liver morphology. Similar appearance of calcification in the right lobe of the liver. Musculoskeletal: Renal osteodystrophy.  No acute osseous pathology. Review of the MIP images confirms the above findings. IMPRESSION: 1. No CT evidence of central pulmonary artery embolus. 2. Interval development of a large pericardial effusion. Correlation with clinical exam recommended to evaluate for possibility of cardiac tamponade. 3. Small left pleural effusion and left lung base atelectasis. Pneumonia is not excluded. 4. A 1 cm right lower lobe nodule. Consider one of the following in 3 months for both low-risk and high-risk individuals: (a) repeat chest CT, (b) follow-up PET-CT, or (c) tissue sampling. This recommendation follows the consensus statement: Guidelines for Management of Incidental Pulmonary Nodules Detected on CT Images: From the Fleischner Society 2017; Radiology 2017; 284:228-243. 5.  Aortic Atherosclerosis (ICD10-I70.0). These results will be called to the ordering clinician or representative by the Radiologist Assistant, and communication documented in the PACS or [employer]. Electronically Signed   By: Elgie Collard M.D.   On: 10/01/2022 20:39   DG Chest Port 1 View  Result Date: 10/01/2022 CLINICAL DATA:  dyspnea  EXAM: PORTABLE CHEST - 1 VIEW COMPARISON:  09/25/2022 FINDINGS: Interstitial prominence with pulmonary opacities improved at the left lung base, slightly increased laterally in the right mid lung. Stable cardiomegaly. No effusion. Chronic calcifications inferior to the distal left scapula. IMPRESSION: Improving left lower lobe airspace disease. Electronically Signed   By: Corlis Leak M.D.   On: 10/01/2022 14:03    Cardiac Studies   None  Patient Profile      Gloria Gallagher is a 72 y.o. female with a hx of ESRD on HD, prior MI, aortic insufficiency, HTN who is being seen today for the evaluation of pericardial effusion at the request of critical care medicine   Assessment & Plan    1: Pericardial tamponade-patient apparently has a large pericardial effusion with tamponade physiology.  She did not tolerate dialysis.  She has end-stage renal disease.  She currently is on pressors the blood pressure in the 130s systolic.  She also is on apixaban for A-fib.  Plan is for urgent intubation and subxiphoid pericardiocentesis.  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     For questions or updates, please contact [facility] Please consult [email or web address] for contact info under        Signed, Nanetta Batty, MD  10/02/2022, 8:40 AM

## 2022-10-02 NOTE — Procedures (Signed)
Intubation Procedure Note  Gloria Gallagher  [id number]  Oct 10, 1950  Date:10/02/22  Time:9:03 AM   Provider Performing:Cap Massi C Katrinka Blazing    Procedure: Intubation (31500)  Indication(s) Respiratory Failure  Consent Unable to obtain consent due to emergent nature of procedure.   Anesthesia Etomidate and Rocuronium   Time Out Verified patient identification, verified procedure, site/side was marked, verified correct patient position, special equipment/implants available, medications/allergies/relevant history reviewed, required imaging and test results available.   Sterile Technique Usual hand hygeine, masks, and gloves were used   Procedure Description Patient positioned in bed supine.  Sedation given as noted above.  Patient was intubated with endotracheal tube using Glidescope.  View was Grade 1 full glottis .  Number of attempts was 1.  Colorimetric CO2 detector was consistent with tracheal placement.   Complications/Tolerance None; patient tolerated the procedure well. Chest X-ray is ordered to verify placement.   EBL Minimal   Specimen(s) None

## 2022-10-02 NOTE — Progress Notes (Signed)
Pharmacy Antibiotic Note  Gloria Gallagher is a 72 y.o. female admitted on 10/01/2022 with sepsis.  Pharmacy has been consulted for Vancomycin/Cefepime dosing. WBC WNL. Hypotensive requiring multiple pressors. ESRD on HD MWF. Did get an extra session tonight.   Plan: -Vancomycin 1500 mg IV x 1, then 750 mg IV qHD MWF -Cefepime 1g IV q24h -Trend WBC, temp -F/U infectious work-up -Drug levels as indicated -Watch for any additional HD sessions that may require alteration of dosing   Temp (24hrs), Avg:98.2 F (36.8 C), Min:97.7 F (36.5 C), Max:98.9 F (37.2 C)  Recent Labs  Lab 09/25/22 0310 10/01/22 1333 10/01/22 1338  WBC 6.0 8.7  --   CREATININE 6.40* 4.62* 4.70*    Estimated Creatinine Clearance: 10.5 mL/min (A) (by C-G formula based on SCr of 4.7 mg/dL (H)).    Allergies  Allergen Reactions   Penicillin G Hives and Itching   Shrimp (Diagnostic) Hives    Abran Duke, PharmD, BCPS Clinical Pharmacist Phone: [phone]

## 2022-10-02 NOTE — Consult Note (Signed)
   NAME:  Gloria Gallagher, MRN:  [id number], DOB:  [birth year], LOS: 1 ADMISSION DATE:  10/01/2022, CONSULTATION DATE:  5/20 REFERRING MD:  Durwin Nora CHIEF COMPLAINT:  dyspnea    History of Present Illness:  72 year old female w/ Hx as listed below. Brought back to ER for second presentation in less than one week time w/ cc: shortness of breath. Receiving her scheduled iHD as ordered. On her last visit (5/14)  she also complained of chest pain. She had CT chest that was negative for PE, but she did have marked ectasia of main pulm artery suggesting pulmonary hypertension, w/ reflux into hepatic veins suggesting tricuspid incompetence.  Was sent home w/ Lidoderm patch. Presents again on 5/20 w/ cc: on-going/worsening shortness of breath, orthopnea and LE edema even after scheduled HD. CXR showed left basilar volume loss c/w prior film but not worse., covid neg, BNP 473 high sens trop I 43  PCCM asked to eval for on-going dyspnea   Pertinent  Medical History  ESRD (MWF), AFib, CAD, HTN, aortic atherosclerosis, COVID, GERD, Gout, hyperparathyroidism, neuropathy, seasonal allergies  Last ECHO 2023 EF 55-60% no WMA, RV systolic fxn reduced, elevated RVP, RA mildly dilated, mod to severe aortic vavle regurg,  Significant Hospital Events: Including procedures, antibiotic start and stop dates in addition to other pertinent events   5/20 presents to ER w/ cc Shortness of breath in spite of full iHD treatment   Interim History / Subjective:  Worsening resp status and BP this am. Feels "awful" Echo with tamponade physiology PP on a line  Objective   Blood pressure (!) 40/21, pulse 88, temperature (!) 97 F (36.1 C), temperature source Axillary, resp. rate (!) 22, SpO2 100 %.    FiO2 (%):  [50 %-60 %] 50 %   Intake/Output Summary (Last 24 hours) at 10/02/2022 0855 Last data filed at 10/02/2022 0800 Gross per 24 hour  Intake 1627.69 ml  Output 3000 ml  Net -1372.31 ml   There were no vitals filed for  this visit.  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

## 2022-10-02 NOTE — Interval H&P Note (Signed)
History and Physical Interval Note:  10/02/2022 9:06 AM  Gloria Gallagher  has presented today for surgery, with the diagnosis of chest pain.  The various methods of treatment have been discussed with the patient and family. After consideration of risks, benefits and other options for treatment, the patient has consented to  Procedure(s): PERICARDIOCENTESIS (N/A) as a surgical intervention.  The patient's history has been reviewed, patient examined, no change in status, stable for surgery.  I have reviewed the patient's chart and labs.  Questions were answered to the patient's satisfaction.      Verne Carrow

## 2022-10-03 DIAGNOSIS — I314 Cardiac tamponade: Secondary | ICD-10-CM | POA: Diagnosis not present

## 2022-10-03 LAB — BASIC METABOLIC PANEL
Anion gap: 19 — ABNORMAL HIGH (ref 5–15)
BUN: 47 mg/dL — ABNORMAL HIGH (ref 8–23)
CO2: 23 mmol/L (ref 22–32)
Calcium: 8.5 mg/dL — ABNORMAL LOW (ref 8.9–10.3)
Chloride: 91 mmol/L — ABNORMAL LOW (ref 98–111)
Creatinine, Ser: 7.41 mg/dL — ABNORMAL HIGH (ref 0.44–1.00)
GFR, Estimated: 5 mL/min — ABNORMAL LOW (ref >60)
Glucose, Bld: 134 mg/dL — ABNORMAL HIGH (ref 70–99)
Potassium: 4.6 mmol/L (ref 3.5–5.1)
Sodium: 133 mmol/L — ABNORMAL LOW (ref 135–145)

## 2022-10-03 LAB — CBC
HCT: 31.7 % — ABNORMAL LOW (ref 36.0–46.0)
Hemoglobin: 10.1 g/dL — ABNORMAL LOW (ref 12.0–15.0)
MCH: 30.1 pg (ref 26.0–34.0)
MCHC: 31.9 g/dL (ref 30.0–36.0)
MCV: 94.3 fL (ref 80.0–100.0)
Platelets: 298 10*3/uL (ref 150–400)
RBC: 3.36 MIL/uL — ABNORMAL LOW (ref 3.87–5.11)
RDW: 14.6 % (ref 11.5–15.5)
WBC: 11.2 10*3/uL — ABNORMAL HIGH (ref 4.0–10.5)
nRBC: 0 % (ref 0.0–0.2)

## 2022-10-03 LAB — CYTOLOGY - NON PAP

## 2022-10-03 LAB — MAGNESIUM: Magnesium: 2.1 mg/dL (ref 1.7–2.4)

## 2022-10-03 LAB — HEPATITIS B SURFACE ANTIBODY, QUANTITATIVE: Hep B S AB Quant (Post): 68 m[IU]/mL (ref >9.9)

## 2022-10-03 LAB — PHOSPHORUS: Phosphorus: 6.3 mg/dL — ABNORMAL HIGH (ref 2.5–4.6)

## 2022-10-03 LAB — FERRITIN: Ferritin: 5089 ng/mL — ABNORMAL HIGH (ref 11–307)

## 2022-10-03 LAB — CK: Total CK: 47 U/L (ref 38–234)

## 2022-10-03 LAB — PROTEIN, BODY FLUID (OTHER): Total Protein, Body Fluid Other: 6.1 g/dL

## 2022-10-03 LAB — C-REACTIVE PROTEIN: CRP: 14.4 mg/dL — ABNORMAL HIGH (ref <1.0)

## 2022-10-03 LAB — LD, BODY FLUID (OTHER): LD, Body Fluid: 1294 IU/L

## 2022-10-03 LAB — GLUCOSE, BODY FLUID OTHER: Glucose, Body Fluid Other: 35 mg/dL

## 2022-10-03 LAB — SEDIMENTATION RATE: Sed Rate: 50 mm/hr — ABNORMAL HIGH (ref 0–22)

## 2022-10-03 MED ORDER — RENA-VITE PO TABS
1.0000 | ORAL_TABLET | Freq: Every day | ORAL | Status: DC
  Administered 2022-10-03 – 2022-10-05 (×3): 1 via ORAL
  Filled 2022-10-03 (×3): qty 1

## 2022-10-03 MED ORDER — MIDODRINE HCL 5 MG PO TABS
10.0000 mg | ORAL_TABLET | Freq: Once | ORAL | Status: AC
  Administered 2022-10-03: 10 mg via ORAL
  Filled 2022-10-03: qty 2

## 2022-10-03 MED ORDER — AMIODARONE HCL 200 MG PO TABS
200.0000 mg | ORAL_TABLET | Freq: Every day | ORAL | Status: DC
  Administered 2022-10-03 – 2022-10-06 (×4): 200 mg via ORAL
  Filled 2022-10-03 (×4): qty 1

## 2022-10-03 MED ORDER — ALBUMIN HUMAN 25 % IV SOLN
25.0000 g | Freq: Once | INTRAVENOUS | Status: AC
  Administered 2022-10-03: 25 g via INTRAVENOUS
  Filled 2022-10-03: qty 100

## 2022-10-03 MED ORDER — HEPARIN SODIUM (PORCINE) 1000 UNIT/ML DIALYSIS: 40.0000 [IU]/kg | INTRAMUSCULAR | Status: DC | PRN

## 2022-10-03 MED ORDER — APIXABAN 5 MG PO TABS
5.0000 mg | ORAL_TABLET | Freq: Two times a day (BID) | ORAL | Status: DC
  Administered 2022-10-03 – 2022-10-06 (×7): 5 mg via ORAL
  Filled 2022-10-03 (×7): qty 1

## 2022-10-03 MED ORDER — NAPHAZOLINE-GLYCERIN 0.012-0.25 % OP SOLN
1.0000 [drp] | Freq: Four times a day (QID) | OPHTHALMIC | Status: DC | PRN
  Administered 2022-10-03: 1 [drp] via OPHTHALMIC
  Filled 2022-10-03: qty 15

## 2022-10-03 NOTE — Progress Notes (Signed)
Rounding Note    Patient Name: Gloria Gallagher Date of Encounter: 10/03/2022  [facility] Cardiologist: Kristeen Miss, MD   Subjective   Status post emergent pericardiocentesis for tamponade yesterday.  Clinically stable today.  On nasal O2.  No longer requiring BiPAP.  Off all pressors.  Inpatient Medications    Scheduled Meds:  apixaban  5 mg Oral BID   atorvastatin  20 mg Oral Daily   Chlorhexidine Gluconate Cloth  6 each Topical Q0600   docusate sodium  100 mg Oral BID   ferric citrate  210 mg Oral TID WC   hydrocortisone sod succinate (SOLU-CORTEF) inj  100 mg Intravenous BID   pantoprazole (PROTONIX) IV  40 mg Intravenous QHS   polyethylene glycol  17 g Per Tube Daily   sodium chloride flush  3 mL Intravenous Q12H   Continuous Infusions:  sodium chloride     sodium chloride 250 mL (10/02/22 2115)   sodium chloride     amiodarone 30 mg/hr (10/02/22 2207)   ceFEPime (MAXIPIME) IV 1 g (10/02/22 2123)   dexmedetomidine (PRECEDEX) IV infusion Stopped (10/02/22 0945)   fentaNYL infusion INTRAVENOUS Stopped (10/02/22 0915)   norepinephrine (LEVOPHED) Adult infusion 5 mcg/min (10/02/22 1600)   promethazine (PHENERGAN) injection (IM or IVPB) Stopped (10/02/22 1610)   vancomycin     vasopressin Stopped (10/02/22 1014)   PRN Meds: Place/Maintain arterial line **AND** sodium chloride, sodium chloride, alteplase, alum & mag hydroxide-simeth, fentaNYL (SUBLIMAZE) injection, fentaNYL (SUBLIMAZE) injection, heparin, lidocaine (PF), lidocaine-prilocaine, mouth rinse, pentafluoroprop-tetrafluoroeth, promethazine (PHENERGAN) injection (IM or IVPB), sodium chloride flush   Vital Signs    Vitals:   10/03/22 0600 10/03/22 0628 10/03/22 0700 10/03/22 0800  BP:    120/60  Pulse: 71 69 79 70  Resp: 20 17 16 16   Temp:   (!) 96.9 F (36.1 C)   TempSrc:   Axillary   SpO2: 95% (!) 85% 100% 100%  Weight:        Intake/Output Summary (Last 24 hours) at 10/03/2022  0809 Last data filed at 10/03/2022 0300 Gross per 24 hour  Intake 170.77 ml  Output 270 ml  Net -99.23 ml      10/03/2022    3:00 AM 09/25/2022    2:54 AM 03/20/2022    7:51 AM  Last 3 Weights  Weight (lbs) 138 lb 10.7 oz 156 lb 8.4 oz 157 lb 3.2 oz  Weight (kg) 62.9 kg 71 kg 71.305 kg      Telemetry    A-fib with slow ventricular response personally Reviewed  ECG    Not performed today  Physical Exam   GEN: On nonrebreather and respiratory distress Neck: No JVD Cardiac: RRR, no murmurs, rubs, or gallops.  Respiratory: Clear to auscultation bilaterally. GI: Soft, nontender, non-distended  MS: No edema; No deformity. Neuro:  Nonfocal  Psych: Normal affect   Labs    High Sensitivity Troponin:   Recent Labs  Lab 09/25/22 0310 09/25/22 0510 10/01/22 1330 10/01/22 1333  TROPONINIHS 39* 37* 43* 43*     Chemistry Recent Labs  Lab 10/01/22 1333 10/01/22 1338 10/02/22 0302 10/02/22 0315 10/03/22 0305  NA 137 134*  134* 134* 132* 133*  K 3.7 3.6  3.6 3.8 3.8 4.6  CL 90* 91* 92*  --  91*  CO2 30  --  23  --  23  GLUCOSE 121* 123* 180*  --  134*  BUN 19 25* 26*  --  47*  CREATININE 4.62* 4.70* 4.81*  --  7.41*  CALCIUM 8.4*  --  8.6*  --  8.5*  MG 2.0  --  1.8  --  2.1  PROT 7.7  --   --   --   --   ALBUMIN 3.2*  --  2.8*  --   --   AST 20  --   --   --   --   ALT 15  --   --   --   --   ALKPHOS 110  --   --   --   --   BILITOT 0.6  --   --   --   --   GFRNONAA 9*  --  9*  --  5*  ANIONGAP 17*  --  19*  --  19*    Lipids No results for input(s): "CHOL", "TRIG", "HDL", "LABVLDL", "LDLCALC", "CHOLHDL" in the last 168 hours.  Hematology Recent Labs  Lab 10/01/22 1333 10/01/22 1338 10/02/22 0302 10/02/22 0315 10/03/22 0305  WBC 8.7  --  9.3  --  11.2*  RBC 3.86*  --  3.45*  --  3.36*  HGB 11.7*   < > 10.5* 11.6* 10.1*  HCT 37.4   < > 33.1* 34.0* 31.7*  MCV 96.9  --  95.9  --  94.3  MCH 30.3  --  30.4  --  30.1  MCHC 31.3  --  31.7  --  31.9  RDW  14.7  --  14.6  --  14.6  PLT 223  --  236  --  298   < > = values in this interval not displayed.   Thyroid No results for input(s): "TSH", "FREET4" in the last 168 hours.  BNP Recent Labs  Lab 10/01/22 1333 10/02/22 0302  BNP 473.5* 546.5*    DDimer No results for input(s): "DDIMER" in the last 168 hours.   Radiology    ECHOCARDIOGRAM LIMITED  Result Date: 10/02/2022    ECHOCARDIOGRAM LIMITED REPORT   Patient Name:   Gloria Gallagher Date of Exam: 10/02/2022 Medical Rec #:  [id number]         Height:       65.0 in Accession #:    [id number]        Weight:       156.5 lb Date of Birth:  1950-08-27        BSA:          1.782 m Patient Age:    71 years          BP:           60/40 mmHg Patient Gender: F                 HR:           73 bpm. Exam Location:  Inpatient Procedure: Limited Echo, Color Doppler, Cardiac Doppler and Echo            Guidance/Pericardial Tap Indications:    I31.3 Pericardial effusion  History:        Patient has prior history of Echocardiogram examinations, most                 recent 10/02/2022. Arrythmias:Atrial Fibrillation; Risk                 Factors:Hypertension.  Sonographer:    Irving Burton Senior RDCS Referring Phys: Verne Carrow, D  Sonographer Comments: Pericardiocentesis  Conclusion(s)/Recommendation(s): Periprocedural echo for pericardiocentesis. The postprocedural images show a small residual effusion and resolution  of RV and RA diastolic collapse and reduction in heart rate. There is a small residual pericardial effusion anterolateral to the left ventricle. Fibrinous strands are seen in that area and the effusion may be partially loculated. The vast majority of the effusion is successfully drained. FINDINGS  Aortic Valve: Aortic regurgitation PHT measures 503 msec. Additional Comments: Spectral Doppler performed. Color Doppler performed.  AORTIC VALVE AI PHT:      503 msec Thurmon Fair MD Electronically signed by Thurmon Fair MD Signature Date/Time:  10/02/2022/10:20:26 AM    Final    CARDIAC CATHETERIZATION  Result Date: 10/02/2022 Pericardial tamponade Successful pericardiocentesis with removal of 950 cc of bloody fluid Recommendations: Will leave pericardial drain in place for next 24-48 hours. Fluid sent to lab for analysis.   ECHOCARDIOGRAM COMPLETE  Result Date: 10/02/2022    ECHOCARDIOGRAM REPORT   Patient Name:   Gloria Gallagher Date of Exam: 10/02/2022 Medical Rec #:  [id number]         Height:       65.0 in Accession #:    [id number]        Weight:       156.5 lb Date of Birth:  08/31/1950        BSA:          1.782 m Patient Age:    71 years          BP:           81/64 mmHg Patient Gender: F                 HR:           106 bpm. Exam Location:  Inpatient Procedure: 2D Echo, Color Doppler and Cardiac Doppler Indications:    pericardial effusion  History:        Patient has prior history of Echocardiogram examinations.  Sonographer:    Irving Burton Senior Referring Phys: Simonne Martinet IMPRESSIONS  1. Left ventricular ejection fraction, by estimation, is 60 to 65%. The left ventricle has normal function. The left ventricle has no regional wall motion abnormalities. There is mild concentric left ventricular hypertrophy. Indeterminate diastolic filling due to E-A fusion.  2. Right ventricular systolic function is moderately reduced. The right ventricular size is mildly enlarged. Mildly increased right ventricular wall thickness. There is normal pulmonary artery systolic pressure. The estimated right ventricular systolic pressure is 35.6 mmHg.  3. Left atrial size was moderately dilated.  4. Right atrial size was moderately dilated.  5. There is marked cystic change of the left lobe of the liver and there is evidence of severe polycystic changes of the neighboring kidney tissue. Large pericardial effusion. Findings are consistent with cardiac tamponade.  6. The mitral valve is normal in structure. Trivial mitral valve regurgitation. No evidence of  mitral stenosis.  7. The aortic valve is tricuspid. There is mild calcification of the aortic valve. Aortic valve regurgitation is moderate. Aortic valve sclerosis is present, with no evidence of aortic valve stenosis.  8. Pulmonic valve regurgitation is moderate.  9. Aortic dilatation noted. There is mild dilatation of the aortic root, measuring 41 mm. There is moderate dilatation of the ascending aorta, measuring 47 mm. 10. The inferior vena cava is dilated in size with <50% respiratory variability, suggesting right atrial pressure of 15 mmHg. Comparison(s): Prior images reviewed side by side. Changes from prior study are noted. There is a new large pericardial effusion with echo evidence of tamponade. Otherwise no change from the  07/04/2021 study. FINDINGS  Left Ventricle: Left ventricular ejection fraction, by estimation, is 60 to 65%. The left ventricle has normal function. The left ventricle has no regional wall motion abnormalities. The left ventricular internal cavity size was normal in size. There is  mild concentric left ventricular hypertrophy. Indeterminate diastolic filling due to E-A fusion. Right Ventricle: Suspect the RV systolic pressure does not reflect the severity of pulmonary artery hypertension due to the presence of pericardial tamponade. The right ventricular size is mildly enlarged. Mildly increased right ventricular wall thickness. Right ventricular systolic function is moderately reduced. There is normal pulmonary artery systolic pressure. The tricuspid regurgitant velocity is 2.27 m/s, and with an assumed right atrial pressure of 15 mmHg, the estimated right ventricular systolic pressure is 35.6 mmHg. Left Atrium: Left atrial size was moderately dilated. Right Atrium: Right atrial size was moderately dilated. Pericardium: There is marked cystic change of the left lobe of the liver and there is evidence of severe polycystic changes of the neighboring kidney tissue. A large pericardial  effusion is present. There is diastolic collapse of the right ventricular free wall and diastolic collapse of the right atrial wall. There is evidence of cardiac tamponade. Mitral Valve: The mitral valve is normal in structure. Mild to moderate mitral annular calcification. Trivial mitral valve regurgitation. No evidence of mitral valve stenosis. Tricuspid Valve: The tricuspid valve is normal in structure. Tricuspid valve regurgitation is mild. Aortic Valve: The aortic valve is tricuspid. There is mild calcification of the aortic valve. Aortic valve regurgitation is moderate. Aortic valve sclerosis is present, with no evidence of aortic valve stenosis. Pulmonic Valve: The pulmonic valve was grossly normal. Pulmonic valve regurgitation is moderate. Aorta: Aortic dilatation noted. There is mild dilatation of the aortic root, measuring 41 mm. There is moderate dilatation of the ascending aorta, measuring 47 mm. Venous: The inferior vena cava is dilated in size with less than 50% respiratory variability, suggesting right atrial pressure of 15 mmHg. IAS/Shunts: No atrial level shunt detected by color flow Doppler.  LEFT VENTRICLE PLAX 2D LVIDd:         4.00 cm   Diastology LVIDs:         2.90 cm   LV e' medial:    8.70 cm/s LV PW:         1.30 cm   LV E/e' medial:  7.4 LV IVS:        1.20 cm   LV e' lateral:   9.57 cm/s LVOT diam:     2.20 cm   LV E/e' lateral: 6.7 LV SV:         62 LV SV Index:   35 LVOT Area:     3.80 cm  RIGHT VENTRICLE RV S prime:     6.64 cm/s TAPSE (M-mode): 1.6 cm LEFT ATRIUM              Index        RIGHT ATRIUM           Index LA diam:        4.20 cm  2.36 cm/m   RA Area:     26.50 cm LA Vol (A2C):   126.0 ml 70.69 ml/m  RA Volume:   79.40 ml  44.55 ml/m LA Vol (A4C):   81.2 ml  45.58 ml/m LA Biplane Vol: 122.0 ml 68.45 ml/m  AORTIC VALVE LVOT Vmax:   115.00 cm/s LVOT Vmean:  86.300 cm/s LVOT VTI:    0.164 m  AORTA  Ao Root diam: 4.10 cm Ao Asc diam:  4.70 cm MITRAL VALVE                TRICUSPID VALVE MV Area (PHT): 2.90 cm    TR Peak grad:   20.6 mmHg MV Decel Time: 262 msec    TR Vmax:        227.00 cm/s MV E velocity: 64.00 cm/s MV A velocity: 56.30 cm/s  SHUNTS MV E/A ratio:  1.14        Systemic VTI:  0.16 m                            Systemic Diam: 2.20 cm Rachelle Hora Croitoru MD Electronically signed by Thurmon Fair MD Signature Date/Time: 10/02/2022/9:10:57 AM    Final    DG Chest Port 1 View  Result Date: 10/02/2022 CLINICAL DATA:  Acute respiratory failure. EXAM: PORTABLE CHEST 1 VIEW COMPARISON:  10/01/2022 FINDINGS: Marked enlargement of the cardiopericardial silhouette, stable. Interstitial markings are diffusely coarsened with chronic features. Similar retrocardiac left base collapse/consolidation with small effusion. Bones are demineralized. Telemetry leads overlie the chest. IMPRESSION: 1. No substantial interval change. 2. Marked enlargement of the cardiopericardial silhouette, compatible with pericardial effusion is seen on CT scan yesterday. 3. Persistent retrocardiac left base collapse/consolidation with small effusion. Electronically Signed   By: Kennith Center M.D.   On: 10/02/2022 06:54   CT Angio Chest PE W and/or Wo Contrast  Result Date: 10/01/2022 CLINICAL DATA:  Concern for pulmonary embolism. EXAM: CT ANGIOGRAPHY CHEST WITH CONTRAST TECHNIQUE: Multidetector CT imaging of the chest was performed using the standard protocol during bolus administration of intravenous contrast. Multiplanar CT image reconstructions and MIPs were obtained to evaluate the vascular anatomy. RADIATION DOSE REDUCTION: This exam was performed according to the departmental dose-optimization program which includes automated exposure control, adjustment of the mA and/or kV according to patient size and/or use of iterative reconstruction technique. CONTRAST:  75mL OMNIPAQUE IOHEXOL 350 MG/ML SOLN COMPARISON:  Chest radiograph dated 10/01/2022 and CT dated 09/25/2022. FINDINGS: Evaluation of this  exam is limited due to respiratory motion artifact. Cardiovascular: There is mild cardiomegaly. There has been interval development of a large pericardial effusion measuring up to 2.2 cm in thickness. Correlation with clinical exam recommended to evaluate for possibility of cardiac tamponade. Three vessel coronary vascular calcification. There is retrograde flow of contrast from the right atrium into the IVC suggestive of right heart dysfunction. There is moderate atherosclerotic calcification of the thoracic aorta. No aneurysmal dilatation. Dilatation of the main pulmonary trunk suggestive of pulmonary hypertension. Evaluation of the pulmonary arteries is limited due to respiratory motion and suboptimal visualization of the peripheral branches. No central pulmonary artery embolus identified. Mediastinum/Nodes: No hilar or mediastinal adenopathy. The esophagus is grossly unremarkable. No mediastinal fluid collection. Lungs/Pleura: Background of emphysema. There is a 1 cm right lower lobe nodule (88/6). There are bibasilar atelectasis. Small left pleural effusion and left lung base atelectasis. Pneumonia is not excluded. Clinical correlation is recommended. There is no pneumothorax. The central airways are patent. Upper Abdomen: Polycystic renal and liver morphology. Similar appearance of calcification in the right lobe of the liver. Musculoskeletal: Renal osteodystrophy.  No acute osseous pathology. Review of the MIP images confirms the above findings. IMPRESSION: 1. No CT evidence of central pulmonary artery embolus. 2. Interval development of a large pericardial effusion. Correlation with clinical exam recommended to evaluate for possibility of cardiac tamponade. 3. Small left pleural  effusion and left lung base atelectasis. Pneumonia is not excluded. 4. A 1 cm right lower lobe nodule. Consider one of the following in 3 months for both low-risk and high-risk individuals: (a) repeat chest CT, (b) follow-up PET-CT,  or (c) tissue sampling. This recommendation follows the consensus statement: Guidelines for Management of Incidental Pulmonary Nodules Detected on CT Images: From the Fleischner Society 2017; Radiology 2017; 284:228-243. 5.  Aortic Atherosclerosis (ICD10-I70.0). These results will be called to the ordering clinician or representative by the Radiologist Assistant, and communication documented in the PACS or [employer]. Electronically Signed   By: Elgie Collard M.D.   On: 10/01/2022 20:39   DG Chest Port 1 View  Result Date: 10/01/2022 CLINICAL DATA:  dyspnea EXAM: PORTABLE CHEST - 1 VIEW COMPARISON:  09/25/2022 FINDINGS: Interstitial prominence with pulmonary opacities improved at the left lung base, slightly increased laterally in the right mid lung. Stable cardiomegaly. No effusion. Chronic calcifications inferior to the distal left scapula. IMPRESSION: Improving left lower lobe airspace disease. Electronically Signed   By: Corlis Leak M.D.   On: 10/01/2022 14:03    Cardiac Studies   None  Patient Profile      Gloria Gallagher is a 72 y.o. female with a hx of ESRD on HD, prior MI, aortic insufficiency, HTN who is being seen today for the evaluation of pericardial effusion at the request of critical care medicine   Assessment & Plan    1: Pericardial tamponade-patient had tamponade physiology with tachycardia and hypotension requiring pressors.  She was intubated and given to age and taken urgently to the Cath Lab where Dr. Clifton James performed a subxiphoid pericardiocentesis removing approximately a liter of bloody fluid.  She has had about 50 cc of fluid out in her drain over the last 24 hours.  Fluid was sent for lab analysis.  Gram stain was negative.  Her pressor requirement requirement has resolved.  Her vital signs are stable.  She was successfully extubated.  Plan hemodialysis today per Dr. Katrinka Blazing.  If she has less than 50 cc of drainage in the next 24 hours I will remove her  pericardial drain tomorrow.  Will put her back on apixaban for her atrial fibrillation.  Limited follow-up echo at the termination of procedure showed resolution of her pericardial effusion as well as her RA/RV collapse.  For questions or updates, please contact [facility] Please consult [email or web address] for contact info under        Signed, Nanetta Batty, MD  10/03/2022, 8:09 AM

## 2022-10-03 NOTE — Progress Notes (Signed)
Initial Nutrition Assessment  DOCUMENTATION CODES:   Non-severe (moderate) malnutrition in context of chronic illness  INTERVENTION:   Recommend liberalizing diet to 2g sodium with 1200 mL fluid restriction for now given reduced intake and malnutrition. If po intake inadequate on follow-up, recommend liberalizing diet further. No dark sodas such as reg/diet shasta cola, etc  Double protein on meal trays  Snacks between meals  Add renal MVI; discussed recommendation to continue post discharge with pt  Pt declined oral nutrition supplement such as protein shake at this time  NUTRITION DIAGNOSIS:   Moderate Malnutrition related to chronic illness as evidenced by mild muscle depletion, moderate muscle depletion, edema, energy intake < 75% for > or equal to 1 month.   GOAL:   Patient will meet greater than or equal to 90% of their needs   MONITOR:   PO intake, Labs, Weight trends  REASON FOR ASSESSMENT:   Malnutrition Screening Tool    ASSESSMENT:   72 yo female admitted 10/01/22 with with obstructive shock secondary to cardiac tamponade from large pericardial effusion. Pt with recent ED visit on 09/26/22 with chest pain and exertional dyspnea. PMH includes ESRD/HD, HTN, GERD, renal osteodystrophy and secondary hyperparathyroidism, polycystic kidney disease, hepatic lesion/cysts  5/22 Emergent pericardiocentesis for tamponade yesterday with approximately 1 L bloody fluid removed, intubated and extubated  Pt alert, off pressors, off Bipap. Pericardial drain with 50 mL in 24 hours, possible removal tomorrow. Noted plan for iHD today Pathology from pericardial fluid pending  Pt ate late breakfast this AM but then was too full to eat lunch. Recorded po intake of breakfast 70%  Pt reports on dialysis days, she does not eat breakfast, goes to iHD and then eats brunch/lunch when she gets home. This usually consists of grilled cheese sandwich or chicken/fish. Dinner is usually piece  of fish with home fries and a veggie; sometimes she will have cake or ice cream for dessert.  If pt has a snack, it might be a slice of watermelon, apple pie/cake. Pt denies taking any oral nutrition supplements at home, pt also reports she does not take any vitamins/minerals. Discussed plan to initiate renal MVI with pt and encouraged pt to continue at discharge. PO diet appears to be low in water soluble vitamins  Pt lives with her sister; pt also reports her daughter and grandson come over each day  Noted pt has been compliant with full iHD treatments.  Pt reports she does not know her dry weight. Current wt 62.9 kg; per weight encounters, recent weights have been 68-71 kg. Noted nephrology planning to challenge dry weight, suspect pt has experienced true weight loss. Noted muscle wasting and subcutaneous fat loss on exam  Pt reports nausea yesterday (resolved) but no vomiting; no diarrhea with hx of chronic constipation  Pt is anuric; iHD 5/21 early in AM with 3L net UF. M/W/F schedule. Plan for iHD again today  Noted phosphorus elevated at 6.3, ferric citrate ordered with meals. Monitor trend  Labs: phosphorus 6.3 Meds: ferric citrate, miralax   NUTRITION - FOCUSED PHYSICAL EXAM:  Flowsheet Row Most Recent Value  Orbital Region Mild depletion  Upper Arm Region Mild depletion  Thoracic and Lumbar Region Mild depletion  Buccal Region No depletion  Temple Region Moderate depletion  Clavicle Bone Region Moderate depletion  Clavicle and Acromion Bone Region Moderate depletion  Scapular Bone Region Moderate depletion  Dorsal Hand Moderate depletion  Patellar Region Moderate depletion  Anterior Thigh Region Moderate depletion  Posterior Calf Region  Severe depletion  Edema (RD Assessment) Mild       Diet Order:  RENAL DIET with 1200 mL Fluid Restriction   EDUCATION NEEDS:   Education needs have been addressed  Skin:  Skin Assessment: Reviewed RN Assessment  Last BM:   5/19  Height:   Ht Readings from Last 1 Encounters:  09/25/22 5\' 5"  (1.651 m)    Weight:   Wt Readings from Last 1 Encounters:  10/03/22 62.9 kg    BMI:  Body mass index is 23.08 kg/m.  Estimated Nutritional Needs:   Kcal:  1700-1900 kcals  Protein:  85-105 g  Fluid:  1L plus UOP    Romelle Starcher MS, RDN, LDN, CNSC Registered Dietitian 3 Clinical Nutrition RD Pager and On-Call Pager Number Located in [place]

## 2022-10-03 NOTE — Progress Notes (Signed)
NAME:  Gloria Gallagher, MRN:  [id number], DOB:  1951-01-23, LOS: 2 ADMISSION DATE:  10/01/2022, CONSULTATION DATE:  5/20 REFERRING MD:  Dr. Gloris Manchester, CHIEF COMPLAINT:  Dyspnea  History of Present Illness:  Gloria Gallagher. Gloria Gallagher is a 72 year old female with PMHx of ESRD on HD (MWF), HTN, aortic insufficiency (mod-severe), afib, anemia, gout, and GERD who presented to the ED with SOB after desatting to 80% undergoing HD. Presented to ED 6 days prior with similar complaints of worsening DOE, orthopnea, and left-sided chest pain.   On ED arrival, was tachycardiac and tachypneic but normotensive on 4L [place]. Progressed to BiPAP with continued use of accessory muscles and work of breathing. CT chest on 5/14 was negative for PE, but had marked ectasia of main pulm artery suggesting pHTN with reflux into hepatic veins. 5/20 ED eval pertinent for CXR with left basilar volume loss, BNP 473, Na 134, Hgb 11.7, and stable trop. Presentation concerning for pericardial effusion and admitted to ICU for evaluation and management of acute hypoxic respiratory failure.  Pertinent  Medical History  ESRD with HD (MWF) Afib Aortic insufficiency (mod to severe) HTN HLD Anemia Gout GERD CAD Hyperparathyroidism Neuropathy Seasonal allergies  Significant Hospital Events: Including procedures, antibiotic start and stop dates in addition to other pertinent events   5/20 presented to ED w/ cc SOB in spite of full iHD treatment. Hypotensive overnight (MAP 53), requiring 2 pressor shock during emergent dialysis. Started on vanc/cefepime. Unable to wean off BiPAP 5/21 worsening respiratory status, echocardiogram with cardiac tamponade. Emergent intubation and pericardiocentesis with drain placement. Extubated same day with respiratory improvement.   Interim History / Subjective:  Now on 5L via [place] and without pressors. Clinically stable and reports feeling much improved. Expresses mild discomfort with pericardial drain  and new R eye dryness requesting eye drops. ROS otherwise negative. HD today and anticipating pericardial drain removal tomorrow.   Objective   Blood pressure (!) 131/54, pulse 69, temperature (!) 96.9 F (36.1 C), temperature source Axillary, resp. rate 18, weight 62.9 kg, SpO2 95 %.     Intake/Output Summary (Last 24 hours) at 10/03/2022 1000 Last data filed at 10/03/2022 0800 Gross per 24 hour  Intake 102 ml  Output 270 ml  Net -168 ml    Filed Weights   10/03/22 0300  Weight: 62.9 kg    Examination: General: Ill-appearing, sitting upright with nasal cannula, conversant, NAD HENT: NCAT, missing bottom teeth, nasal cannula in place Neck: no JVD Lungs: CTAB, diminished bases, no wheezes, rhonchi, or rales, normal WOB Cardiovascular: RRR, no murmur or rub appreciated Abdomen: Soft, non-tender Extremities: Warm, dry, no edema, AV fistula in left arm Neuro: No focal deficits appreciated, CN II-XII grossly intact   Assessment & Plan:  Gloria Gallagher is a 72 year old female with PMHx of ESRD on HD (MWF), HTN, aortic insufficiency (mod-severe), afib, anemia, gout, and GERD who is admitted for management of acute hypoxic respiratory failure 2/2 cardiac tamponade  Obstructive shock 2/2 Cardiac tamponade, resolved S/p emergent pericardiocentesis. Immediate drainage of 950 cc of serosanguinous fluid. Drain with ~50cc of fluid in last 24hrs. Clinically improved with stable MAP and continued resolution of symptoms. No pressor requirement. Etiology of pericardial effusion unknown, but seems unlikely to be uremic or infectious.  Fluid studies sent out with pathology. Culture NGTD <24hrs, gram stain without organisms, and prior TBT 07/2022 negative in setting of no recent infection. Given CT imaging (below) and history, concern for malignancy - Cardiology  and nephrology following - Anticipate subxiphoid drain removal tomorrow if <50cc in 24hrs - Continue following pericardial fluid studies  and pathology - Discontinue vanc/cefepime and stress steroids - PT/OT eval and treat  Multiple hepatic lesions and renal cysts CTA on arrival with multiple low-density lesions in liver with calcifications and numerous cysts of bilateral kidneys. Per chart review, referred for CT and colon cancer screening in 2023 due to known hepatic lesion. In discussion with patient/family, never received screening (refusal) and has no knowledge of prior liver/kidney disease including polycystic kidney disease.   Acute hypoxic respiratory failure, improving  Respiratory failure likely secondary to cardiac tamponade. No oxygen requirement at home - Currently in 5L via nasal cannula. Wean as able - Incentive spirometry  ESRD on HD  Hyponatremia - HD today; follows MWF schedule - Monitor/replete electrolytes as needed  Afib  - Discontinue amiodarone gtt - Start home Eliqius 200 qd  Aortic insufficiency  Echo with moderate AV regurg and aortic dilation - CTM; cardiology following  HTN  HLD - home statin  Anemia Hgb drop to 10.1 this AM, likely secondary to blood/pericardial fluid removal. - home ferric citrate - CTM  GERD  - nausea resolved, discontinued IV PPI due to extubation - prns if needed  R eye dryness - eye drops prn   Best Practice (right click and "Reselect all SmartList Selections" daily)   Diet/type: renal diet DVT prophylaxis: home Eliquis GI prophylaxis: none Lines: No longer needed.  Order written to d/c  Foley:  N/A Code Status:  full code  Labs   CBC: Recent Labs  Lab 10/01/22 1333 10/01/22 1338 10/02/22 0302 10/02/22 0315 10/03/22 0305  WBC 8.7  --  9.3  --  11.2*  NEUTROABS 6.6  --   --   --   --   HGB 11.7* 12.9  12.9 10.5* 11.6* 10.1*  HCT 37.4 38.0  38.0 33.1* 34.0* 31.7*  MCV 96.9  --  95.9  --  94.3  PLT 223  --  236  --  298    Basic Metabolic Panel: Recent Labs  Lab 10/01/22 1333 10/01/22 1338 10/02/22 0302 10/02/22 0315  10/03/22 0305  NA 137 134*  134* 134* 132* 133*  K 3.7 3.6  3.6 3.8 3.8 4.6  CL 90* 91* 92*  --  91*  CO2 30  --  23  --  23  GLUCOSE 121* 123* 180*  --  134*  BUN 19 25* 26*  --  47*  CREATININE 4.62* 4.70* 4.81*  --  7.41*  CALCIUM 8.4*  --  8.6*  --  8.5*  MG 2.0  --  1.8  --  2.1  PHOS  --   --  4.1  --   --    GFR: Estimated Creatinine Clearance: 6.1 mL/min (A) (by C-G formula based on SCr of 7.41 mg/dL (H)). Recent Labs  Lab 10/01/22 1333 10/02/22 0302 10/03/22 0305  WBC 8.7 9.3 11.2*     Liver Function Tests: Recent Labs  Lab 10/01/22 1333 10/02/22 0302  AST 20  --   ALT 15  --   ALKPHOS 110  --   BILITOT 0.6  --   PROT 7.7  --   ALBUMIN 3.2* 2.8*    No results for input(s): "LIPASE", "AMYLASE" in the last 168 hours. No results for input(s): "AMMONIA" in the last 168 hours.  ABG    Component Value Date/Time   PHART 7.413 10/02/2022 0315   PCO2ART 40.1 10/02/2022  0315   PO2ART 463 (H) 10/02/2022 0315   HCO3 25.6 10/02/2022 0315   TCO2 27 10/02/2022 0315   O2SAT 100 10/02/2022 0315     Coagulation Profile: No results for input(s): "INR", "PROTIME" in the last 168 hours.  Cardiac Enzymes: No results for input(s): "CKTOTAL", "CKMB", "CKMBINDEX", "TROPONINI" in the last 168 hours.  HbA1C: Hgb A1c MFr Bld  Date/Time Value Ref Range Status  01/18/2022 02:26 AM 5.3 4.8 - 5.6 % Final    Comment:    (NOTE) Pre diabetes:          5.7%-6.4%  Diabetes:              >6.4%  Glycemic control for   <7.0% adults with diabetes     CBG: No results for input(s): "GLUCAP" in the last 168 hours.   Past Medical History:  She,  has a past medical history of Anemia, Aortic atherosclerosis (HCC), Arthritis, Atrial fibrillation (HCC), CAD (coronary artery disease), COVID (2021), Dialysis patient (HCC), ESRD on hemodialysis (HCC), GERD (gastroesophageal reflux disease), Gout, Heart murmur, Hyperparathyroidism (HCC), Hypertension, Neuromuscular disorder (HCC),  Pneumonia, and Seasonal allergies.   Surgical History:   Past Surgical History:  Procedure Laterality Date   CARDIOVERSION N/A 05/16/2021   Procedure: CARDIOVERSION;  Surgeon: Nahser, Deloris Ping, MD;  Location[facility] ENDOSCOPY;  Service: Cardiovascular;  Laterality: N/A;   CARDIOVERSION N/A 08/01/2021   Procedure: CARDIOVERSION;  Surgeon: Thurmon Fair, MD;  Location: [facility] ENDOSCOPY;  Service: Cardiovascular;  Laterality: N/A;   CARDIOVERSION N/A 12/05/2021   Procedure: CARDIOVERSION;  Surgeon: Thomasene Ripple, DO;  Location: [facility] ENDOSCOPY;  Service: Cardiovascular;  Laterality: N/A;   DIALYSIS FISTULA CREATION Left    PERICARDIOCENTESIS N/A 10/02/2022   Procedure: PERICARDIOCENTESIS;  Surgeon: Kathleene Hazel, MD;  Location: [facility] INVASIVE CV LAB;  Service: Cardiovascular;  Laterality: N/A;   REVISON OF ARTERIOVENOUS FISTULA Left 02/16/2022   Procedure: LEFT ARM ARTERIOVENOUS FISTULA REVISION;  Surgeon: Leonie Douglas, MD;  Location: [facility] OR;  Service: Vascular;  Laterality: Left;  PERIPHERAL NERVE BLOCK   TUBAL LIGATION       Social History:   reports that she has quit smoking. Her smoking use included cigarettes. She has never been exposed to tobacco smoke. She has never used smokeless tobacco. She reports current alcohol use of about 1.0 standard drink of alcohol per week. She reports that she does not use drugs.   Family History:  Her family history includes Diabetes in her sister. There is no history of Stomach cancer or Colon cancer.   Allergies Allergies  Allergen Reactions   Penicillin G Hives and Itching   Shrimp (Diagnostic) Hives     Home Medications  Prior to Admission medications   Medication Sig Start Date End Date Taking? Authorizing Provider  acetaminophen (TYLENOL) 500 MG tablet Take 2 tablets (1,000 mg total) by mouth every 6 (six) hours as needed for mild pain or moderate pain. 01/19/22   Alfredo Martinez, MD  amiodarone (PACERONE) 200 MG tablet Take 1 tablet (200 mg  total) by mouth daily. 10/19/21   Fenton, Clint R, PA  apixaban (ELIQUIS) 5 MG TABS tablet TAKE 1 TABLET BY MOUTH TWICE A DAY 06/22/22   Nahser, Deloris Ping, MD  atorvastatin (LIPITOR) 20 MG tablet Take 20 mg by mouth daily. 12/05/21   [provider]  diclofenac Sodium (VOLTAREN) 1 % GEL Apply 4 g topically 4 (four) times daily. 01/14/22   Melene Plan, DO  Doxercalciferol (HECTOROL IV) Inject 1 Dose into  the vein every Monday, Wednesday, and Friday with hemodialysis. During dialysis, 3X week 11/08/21 11/07/22  [provider]  ferric citrate (AURYXIA) 1 GM 210 MG(Fe) tablet Take 1 tablet (210 mg total) by mouth 3 (three) times daily with meals. 01/19/22   Alfredo Martinez, MD  oxyCODONE-acetaminophen (PERCOCET/ROXICET) 5-325 MG tablet Take 1 tablet by mouth every 6 (six) hours as needed. 02/16/22   Lars Mage, PA-C  Urea (UREA NAIL) 45 % GEL Apply to affected nail daily 08/08/22   Louann Sjogren, DPM    Adolph Pollack, MS4

## 2022-10-03 NOTE — Consult Note (Signed)
Reason for Consult: Continuity of ESRD care, volume overload Referring Physician: Levon Hedger, MD (CCM)  HPI:  72 year old woman with past medical history significant for coronary artery disease, atrial fibrillation on anticoagulation with apixaban, gastroesophageal reflux disease, osteoarthritis and end-stage renal disease on hemodialysis.  She presented to the emergency room 2 days ago with increasing shortness of breath in spite of having undergone dialysis earlier in the day following which she had additional dialysis here in the hospital on 10/01/2022 with an additional 3 L ultrafiltration.  Yesterday developed progressive worsening of respiratory failure requiring intubation and echocardiogram showed large pericardial effusion with tamponade physiology for which she underwent pericardiocentesis.  She was extubated/weaned to oxygen via nasal cannula and had improvement of her hemodynamic status allowing discontinuation of pressors.  When seen this morning she reports to be feeling better with some minimal discomfort from pericardial drain.  Denies any shortness of breath and does not have any nausea, vomiting or diarrhea.  Denies any fevers or chills preceding hospitalization.  Denies any prior history of malignancy or autoimmune disorders including lupus.  Past Medical History:  Diagnosis Date   Anemia    Aortic atherosclerosis (HCC)    Arthritis    Atrial fibrillation (HCC)    CAD (coronary artery disease)    COVID 2021   mild   Dialysis patient (HCC)    ESRD on hemodialysis (HCC)    MWF at [facility]   GERD (gastroesophageal reflux disease)    Gout    Heart murmur    Hyperparathyroidism (HCC)    Hypertension    in the past, no longer takes medications   Neuromuscular disorder (HCC)    neuropathy   Pneumonia    Seasonal allergies     Past Surgical History:  Procedure Laterality Date   CARDIOVERSION N/A 05/16/2021   Procedure: CARDIOVERSION;  Surgeon: Elease Hashimoto Deloris Ping, MD;   Location: [facility] ENDOSCOPY;  Service: Cardiovascular;  Laterality: N/A;   CARDIOVERSION N/A 08/01/2021   Procedure: CARDIOVERSION;  Surgeon: Thurmon Fair, MD;  Location: [facility] ENDOSCOPY;  Service: Cardiovascular;  Laterality: N/A;   CARDIOVERSION N/A 12/05/2021   Procedure: CARDIOVERSION;  Surgeon: Thomasene Ripple, DO;  Location: [facility] ENDOSCOPY;  Service: Cardiovascular;  Laterality: N/A;   DIALYSIS FISTULA CREATION Left    PERICARDIOCENTESIS N/A 10/02/2022   Procedure: PERICARDIOCENTESIS;  Surgeon: Kathleene Hazel, MD;  Location: [facility] INVASIVE CV LAB;  Service: Cardiovascular;  Laterality: N/A;   REVISON OF ARTERIOVENOUS FISTULA Left 02/16/2022   Procedure: LEFT ARM ARTERIOVENOUS FISTULA REVISION;  Surgeon: Leonie Douglas, MD;  Location: [facility] OR;  Service: Vascular;  Laterality: Left;  PERIPHERAL NERVE BLOCK   TUBAL LIGATION      Family History  Problem Relation Age of Onset   Diabetes Sister    Stomach cancer Neg Hx    Colon cancer Neg Hx     Social History:  reports that she has quit smoking. Her smoking use included cigarettes. She has never been exposed to tobacco smoke. She has never used smokeless tobacco. She reports current alcohol use of about 1.0 standard drink of alcohol per week. She reports that she does not use drugs.  Allergies:  Allergies  Allergen Reactions   Penicillin G Hives and Itching   Shrimp (Diagnostic) Hives    Medications: I have reviewed the patient's current medications. Scheduled:  apixaban  5 mg Oral BID   atorvastatin  20 mg Oral Daily   Chlorhexidine Gluconate Cloth  6 each Topical Q0600   docusate sodium  100 mg Oral BID   ferric citrate  210 mg Oral TID WC   hydrocortisone sod succinate (SOLU-CORTEF) inj  100 mg Intravenous BID   pantoprazole (PROTONIX) IV  40 mg Intravenous QHS   polyethylene glycol  17 g Per Tube Daily   sodium chloride flush  3 mL Intravenous Q12H   Continuous:  sodium chloride     sodium chloride 250 mL (10/02/22 2115)    sodium chloride     amiodarone 30 mg/hr (10/02/22 2207)   ceFEPime (MAXIPIME) IV 1 g (10/02/22 2123)   dexmedetomidine (PRECEDEX) IV infusion Stopped (10/02/22 0945)   fentaNYL infusion INTRAVENOUS Stopped (10/02/22 0915)   norepinephrine (LEVOPHED) Adult infusion 5 mcg/min (10/02/22 1600)   promethazine (PHENERGAN) injection (IM or IVPB) Stopped (10/02/22 8119)   vancomycin     vasopressin Stopped (10/02/22 1014)      Latest Ref Rng & Units 10/03/2022    3:05 AM 10/02/2022    3:15 AM 10/02/2022    3:02 AM  BMP  Glucose 70 - 99 mg/dL 147   829   BUN 8 - 23 mg/dL 47   26   Creatinine 5.62 - 1.00 mg/dL 1.30   8.65   Sodium 784 - 145 mmol/L 133  132  134   Potassium 3.5 - 5.1 mmol/L 4.6  3.8  3.8   Chloride 98 - 111 mmol/L 91   92   CO2 22 - 32 mmol/L 23   23   Calcium 8.9 - 10.3 mg/dL 8.5   8.6       Latest Ref Rng & Units 10/03/2022    3:05 AM 10/02/2022    3:15 AM 10/02/2022    3:02 AM  CBC  WBC 4.0 - 10.5 K/uL 11.2   9.3   Hemoglobin 12.0 - 15.0 g/dL 69.6  29.5  28.4   Hematocrit 36.0 - 46.0 % 31.7  34.0  33.1   Platelets 150 - 400 K/uL 298   236    ECHOCARDIOGRAM LIMITED  Result Date: 10/02/2022    ECHOCARDIOGRAM LIMITED REPORT   Patient Name:   Gloria Gallagher Date of Exam: 10/02/2022 Medical Rec #:  [id number]         Height:       65.0 in Accession #:    [id number]        Weight:       156.5 lb Date of Birth:  04/11/1951        BSA:          1.782 m Patient Age:    71 years          BP:           60/40 mmHg Patient Gender: F                 HR:           73 bpm. Exam Location:  Inpatient Procedure: Limited Echo, Color Doppler, Cardiac Doppler and Echo            Guidance/Pericardial Tap Indications:    I31.3 Pericardial effusion  History:        Patient has prior history of Echocardiogram examinations, most                 recent 10/02/2022. Arrythmias:Atrial Fibrillation; Risk                 Factors:Hypertension.  Sonographer:    Irving Burton Senior RDCS Referring Phys: Verne Carrow, D  Sonographer  Comments: Pericardiocentesis  Conclusion(s)/Recommendation(s): Periprocedural echo for pericardiocentesis. The postprocedural images show a small residual effusion and resolution of RV and RA diastolic collapse and reduction in heart rate. There is a small residual pericardial effusion anterolateral to the left ventricle. Fibrinous strands are seen in that area and the effusion may be partially loculated. The vast majority of the effusion is successfully drained. FINDINGS  Aortic Valve: Aortic regurgitation PHT measures 503 msec. Additional Comments: Spectral Doppler performed. Color Doppler performed.  AORTIC VALVE AI PHT:      503 msec Thurmon Fair MD Electronically signed by Thurmon Fair MD Signature Date/Time: 10/02/2022/10:20:26 AM    Final    CARDIAC CATHETERIZATION  Result Date: 10/02/2022 Pericardial tamponade Successful pericardiocentesis with removal of 950 cc of bloody fluid Recommendations: Will leave pericardial drain in place for next 24-48 hours. Fluid sent to lab for analysis.   ECHOCARDIOGRAM COMPLETE  Result Date: 10/02/2022    ECHOCARDIOGRAM REPORT   Patient Name:   Gloria Gallagher Date of Exam: 10/02/2022 Medical Rec #:  [id number]         Height:       65.0 in Accession #:    [id number]        Weight:       156.5 lb Date of Birth:  07-21-1950        BSA:          1.782 m Patient Age:    71 years          BP:           81/64 mmHg Patient Gender: F                 HR:           106 bpm. Exam Location:  Inpatient Procedure: 2D Echo, Color Doppler and Cardiac Doppler Indications:    pericardial effusion  History:        Patient has prior history of Echocardiogram examinations.  Sonographer:    Irving Burton Senior Referring Phys: Simonne Martinet IMPRESSIONS  1. Left ventricular ejection fraction, by estimation, is 60 to 65%. The left ventricle has normal function. The left ventricle has no regional wall motion abnormalities. There is mild concentric left ventricular  hypertrophy. Indeterminate diastolic filling due to E-A fusion.  2. Right ventricular systolic function is moderately reduced. The right ventricular size is mildly enlarged. Mildly increased right ventricular wall thickness. There is normal pulmonary artery systolic pressure. The estimated right ventricular systolic pressure is 35.6 mmHg.  3. Left atrial size was moderately dilated.  4. Right atrial size was moderately dilated.  5. There is marked cystic change of the left lobe of the liver and there is evidence of severe polycystic changes of the neighboring kidney tissue. Large pericardial effusion. Findings are consistent with cardiac tamponade.  6. The mitral valve is normal in structure. Trivial mitral valve regurgitation. No evidence of mitral stenosis.  7. The aortic valve is tricuspid. There is mild calcification of the aortic valve. Aortic valve regurgitation is moderate. Aortic valve sclerosis is present, with no evidence of aortic valve stenosis.  8. Pulmonic valve regurgitation is moderate.  9. Aortic dilatation noted. There is mild dilatation of the aortic root, measuring 41 mm. There is moderate dilatation of the ascending aorta, measuring 47 mm. 10. The inferior vena cava is dilated in size with <50% respiratory variability, suggesting right atrial pressure of 15 mmHg. Comparison(s): Prior images reviewed side by side. Changes from prior study are noted.  There is a new large pericardial effusion with echo evidence of tamponade. Otherwise no change from the 07/04/2021 study. FINDINGS  Left Ventricle: Left ventricular ejection fraction, by estimation, is 60 to 65%. The left ventricle has normal function. The left ventricle has no regional wall motion abnormalities. The left ventricular internal cavity size was normal in size. There is  mild concentric left ventricular hypertrophy. Indeterminate diastolic filling due to E-A fusion. Right Ventricle: Suspect the RV systolic pressure does not reflect the  severity of pulmonary artery hypertension due to the presence of pericardial tamponade. The right ventricular size is mildly enlarged. Mildly increased right ventricular wall thickness. Right ventricular systolic function is moderately reduced. There is normal pulmonary artery systolic pressure. The tricuspid regurgitant velocity is 2.27 m/s, and with an assumed right atrial pressure of 15 mmHg, the estimated right ventricular systolic pressure is 35.6 mmHg. Left Atrium: Left atrial size was moderately dilated. Right Atrium: Right atrial size was moderately dilated. Pericardium: There is marked cystic change of the left lobe of the liver and there is evidence of severe polycystic changes of the neighboring kidney tissue. A large pericardial effusion is present. There is diastolic collapse of the right ventricular free wall and diastolic collapse of the right atrial wall. There is evidence of cardiac tamponade. Mitral Valve: The mitral valve is normal in structure. Mild to moderate mitral annular calcification. Trivial mitral valve regurgitation. No evidence of mitral valve stenosis. Tricuspid Valve: The tricuspid valve is normal in structure. Tricuspid valve regurgitation is mild. Aortic Valve: The aortic valve is tricuspid. There is mild calcification of the aortic valve. Aortic valve regurgitation is moderate. Aortic valve sclerosis is present, with no evidence of aortic valve stenosis. Pulmonic Valve: The pulmonic valve was grossly normal. Pulmonic valve regurgitation is moderate. Aorta: Aortic dilatation noted. There is mild dilatation of the aortic root, measuring 41 mm. There is moderate dilatation of the ascending aorta, measuring 47 mm. Venous: The inferior vena cava is dilated in size with less than 50% respiratory variability, suggesting right atrial pressure of 15 mmHg. IAS/Shunts: No atrial level shunt detected by color flow Doppler.  LEFT VENTRICLE PLAX 2D LVIDd:         4.00 cm   Diastology LVIDs:          2.90 cm   LV e' medial:    8.70 cm/s LV PW:         1.30 cm   LV E/e' medial:  7.4 LV IVS:        1.20 cm   LV e' lateral:   9.57 cm/s LVOT diam:     2.20 cm   LV E/e' lateral: 6.7 LV SV:         62 LV SV Index:   35 LVOT Area:     3.80 cm  RIGHT VENTRICLE RV S prime:     6.64 cm/s TAPSE (M-mode): 1.6 cm LEFT ATRIUM              Index        RIGHT ATRIUM           Index LA diam:        4.20 cm  2.36 cm/m   RA Area:     26.50 cm LA Vol (A2C):   126.0 ml 70.69 ml/m  RA Volume:   79.40 ml  44.55 ml/m LA Vol (A4C):   81.2 ml  45.58 ml/m LA Biplane Vol: 122.0 ml 68.45 ml/m  AORTIC VALVE LVOT Vmax:  115.00 cm/s LVOT Vmean:  86.300 cm/s LVOT VTI:    0.164 m  AORTA Ao Root diam: 4.10 cm Ao Asc diam:  4.70 cm MITRAL VALVE               TRICUSPID VALVE MV Area (PHT): 2.90 cm    TR Peak grad:   20.6 mmHg MV Decel Time: 262 msec    TR Vmax:        227.00 cm/s MV E velocity: 64.00 cm/s MV A velocity: 56.30 cm/s  SHUNTS MV E/A ratio:  1.14        Systemic VTI:  0.16 m                            Systemic Diam: 2.20 cm Rachelle Hora Croitoru MD Electronically signed by Thurmon Fair MD Signature Date/Time: 10/02/2022/9:10:57 AM    Final    DG Chest Port 1 View  Result Date: 10/02/2022 CLINICAL DATA:  Acute respiratory failure. EXAM: PORTABLE CHEST 1 VIEW COMPARISON:  10/01/2022 FINDINGS: Marked enlargement of the cardiopericardial silhouette, stable. Interstitial markings are diffusely coarsened with chronic features. Similar retrocardiac left base collapse/consolidation with small effusion. Bones are demineralized. Telemetry leads overlie the chest. IMPRESSION: 1. No substantial interval change. 2. Marked enlargement of the cardiopericardial silhouette, compatible with pericardial effusion is seen on CT scan yesterday. 3. Persistent retrocardiac left base collapse/consolidation with small effusion. Electronically Signed   By: Kennith Center M.D.   On: 10/02/2022 06:54   CT Angio Chest PE W and/or Wo Contrast  Result  Date: 10/01/2022 CLINICAL DATA:  Concern for pulmonary embolism. EXAM: CT ANGIOGRAPHY CHEST WITH CONTRAST TECHNIQUE: Multidetector CT imaging of the chest was performed using the standard protocol during bolus administration of intravenous contrast. Multiplanar CT image reconstructions and MIPs were obtained to evaluate the vascular anatomy. RADIATION DOSE REDUCTION: This exam was performed according to the departmental dose-optimization program which includes automated exposure control, adjustment of the mA and/or kV according to patient size and/or use of iterative reconstruction technique. CONTRAST:  75mL OMNIPAQUE IOHEXOL 350 MG/ML SOLN COMPARISON:  Chest radiograph dated 10/01/2022 and CT dated 09/25/2022. FINDINGS: Evaluation of this exam is limited due to respiratory motion artifact. Cardiovascular: There is mild cardiomegaly. There has been interval development of a large pericardial effusion measuring up to 2.2 cm in thickness. Correlation with clinical exam recommended to evaluate for possibility of cardiac tamponade. Three vessel coronary vascular calcification. There is retrograde flow of contrast from the right atrium into the IVC suggestive of right heart dysfunction. There is moderate atherosclerotic calcification of the thoracic aorta. No aneurysmal dilatation. Dilatation of the main pulmonary trunk suggestive of pulmonary hypertension. Evaluation of the pulmonary arteries is limited due to respiratory motion and suboptimal visualization of the peripheral branches. No central pulmonary artery embolus identified. Mediastinum/Nodes: No hilar or mediastinal adenopathy. The esophagus is grossly unremarkable. No mediastinal fluid collection. Lungs/Pleura: Background of emphysema. There is a 1 cm right lower lobe nodule (88/6). There are bibasilar atelectasis. Small left pleural effusion and left lung base atelectasis. Pneumonia is not excluded. Clinical correlation is recommended. There is no  pneumothorax. The central airways are patent. Upper Abdomen: Polycystic renal and liver morphology. Similar appearance of calcification in the right lobe of the liver. Musculoskeletal: Renal osteodystrophy.  No acute osseous pathology. Review of the MIP images confirms the above findings. IMPRESSION: 1. No CT evidence of central pulmonary artery embolus. 2. Interval development of a large pericardial effusion.  Correlation with clinical exam recommended to evaluate for possibility of cardiac tamponade. 3. Small left pleural effusion and left lung base atelectasis. Pneumonia is not excluded. 4. A 1 cm right lower lobe nodule. Consider one of the following in 3 months for both low-risk and high-risk individuals: (a) repeat chest CT, (b) follow-up PET-CT, or (c) tissue sampling. This recommendation follows the consensus statement: Guidelines for Management of Incidental Pulmonary Nodules Detected on CT Images: From the Fleischner Society 2017; Radiology 2017; 284:228-243. 5.  Aortic Atherosclerosis (ICD10-I70.0). These results will be called to the ordering clinician or representative by the Radiologist Assistant, and communication documented in the PACS or [employer]. Electronically Signed   By: Elgie Collard M.D.   On: 10/01/2022 20:39   DG Chest Port 1 View  Result Date: 10/01/2022 CLINICAL DATA:  dyspnea EXAM: PORTABLE CHEST - 1 VIEW COMPARISON:  09/25/2022 FINDINGS: Interstitial prominence with pulmonary opacities improved at the left lung base, slightly increased laterally in the right mid lung. Stable cardiomegaly. No effusion. Chronic calcifications inferior to the distal left scapula. IMPRESSION: Improving left lower lobe airspace disease. Electronically Signed   By: Corlis Leak M.D.   On: 10/01/2022 14:03    Review of Systems  Constitutional:  Negative for chills and fever.  HENT:  Positive for sore throat. Negative for nosebleeds and trouble swallowing.   Respiratory:  Negative for chest  tightness and shortness of breath.   Cardiovascular:  Negative for leg swelling.  Gastrointestinal:  Negative for abdominal pain, nausea and vomiting.  Genitourinary:  Negative for dysuria, frequency, hematuria and urgency.  Musculoskeletal:  Positive for back pain. Negative for arthralgias and myalgias.  Neurological:  Negative for weakness and headaches.   Blood pressure 120/60, pulse 70, temperature (!) 96.9 F (36.1 C), temperature source Axillary, resp. rate 16, weight 62.9 kg, SpO2 100 %. Physical Exam Vitals reviewed.  Constitutional:      Appearance: She is ill-appearing.  HENT:     Head: Normocephalic and atraumatic.     Mouth/Throat:     Mouth: Mucous membranes are moist.     Pharynx: Oropharynx is clear.  Eyes:     Extraocular Movements: Extraocular movements intact.  Neck:     Vascular: No JVD.  Cardiovascular:     Rate and Rhythm: Normal rate and regular rhythm.     Pulses: Normal pulses.     Heart sounds: Normal heart sounds.  Pulmonary:     Effort: Pulmonary effort is normal.     Breath sounds: Examination of the right-lower field reveals decreased breath sounds. Examination of the left-lower field reveals decreased breath sounds. Decreased breath sounds present.  Abdominal:     General: Bowel sounds are normal.     Palpations: Abdomen is soft.  Musculoskeletal:     Cervical back: Normal range of motion and neck supple.     Right lower leg: No edema.     Left lower leg: No edema.     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  Neurological:     Mental Status: She is alert.     Assessment/Plan: 1.  Acute hypoxic respiratory failure: Appears to be from multiple factors including pulmonary edema but largely from pericardial effusion/tamponade with significant improvement seen status post pericardiocentesis.  Will continue elective hemodialysis today with efforts to try and challenge dry weight.   Ongoing workup to assess etiology of pericardial effusion as adherence to dialysis has  been decent and unlikely that this is a uremic pericardial effusion. 2.  End-stage renal disease: Continue hemodialysis Monday/Wednesday/Friday schedule with effort to challenge dry weight via her left upper arm fistula. 3.  Hyponatremia: Likely related to presentation with respiratory failure and hemodynamic instability/fluids.  Monitor with hemodialysis. 4.  Anemia: Hemoglobin and hematocrit borderline low but acceptable, continue to monitor trend to decide on need to restart ESA. 5.  Secondary hyperparathyroidism: Continue renal diet and will follow phosphorus trend to decide on need to resume binders.  Will reconcile medications for vitamin D receptor analog/calcimimetic. 6.  Atrial fibrillation: Apixaban transiently held status post pericardiocentesis and has been restarted today.  Is rate controlled and currently hemodynamic stable after earlier suffering obstructive shock secondary to pericardial effusion.  Gloria Gallagher K. 10/03/2022, 8:18 AM

## 2022-10-03 NOTE — Progress Notes (Signed)
   NAME:  Gloria Gallagher, MRN:  [id number], DOB:  1951-03-18, LOS: 2 ADMISSION DATE:  10/01/2022, CONSULTATION DATE:  5/20 REFERRING MD:  Durwin Nora CHIEF COMPLAINT:  dyspnea    History of Present Illness:  72 year old female w/ Hx as listed below. Brought back to ER for second presentation in less than one week time w/ cc: shortness of breath. Receiving her scheduled iHD as ordered. On her last visit (5/14)  she also complained of chest pain. She had CT chest that was negative for PE, but she did have marked ectasia of main pulm artery suggesting pulmonary hypertension, w/ reflux into hepatic veins suggesting tricuspid incompetence.  Was sent home w/ Lidoderm patch. Presents again on 5/20 w/ cc: on-going/worsening shortness of breath, orthopnea and LE edema even after scheduled HD. CXR showed left basilar volume loss c/w prior film but not worse., covid neg, BNP 473 high sens trop I 43  PCCM asked to eval for on-going dyspnea   Pertinent  Medical History  ESRD (MWF), AFib, CAD, HTN, aortic atherosclerosis, COVID, GERD, Gout, hyperparathyroidism, neuropathy, seasonal allergies  Last ECHO 2023 EF 55-60% no WMA, RV systolic fxn reduced, elevated RVP, RA mildly dilated, mod to severe aortic vavle regurg,  Significant Hospital Events: Including procedures, antibiotic start and stop dates in addition to other pertinent events   5/20 presents to ER w/ cc Shortness of breath in spite of full iHD treatment  5/21 emergent pericardial drain placement for tamponade  Interim History / Subjective:  Much improved.  Off pressors.   Pain controlled.   Has some R eye discomfort.  Objective   Blood pressure 120/60, pulse 70, temperature (!) 96.9 F (36.1 C), temperature source Axillary, resp. rate 16, weight 62.9 kg, SpO2 100 %.    Vent Mode: PSV;CPAP FiO2 (%):  [40 %-100 %] 40 % Set Rate:  [20 bmp] 20 bmp Vt Set:  [450 mL] 450 mL PEEP:  [5 cmH20] 5 cmH20 Pressure Support:  [10 cmH20] 10 cmH20 Plateau  Pressure:  [20 cmH20] 20 cmH20   Intake/Output Summary (Last 24 hours) at 10/03/2022 0848 Last data filed at 10/03/2022 0300 Gross per 24 hour  Intake 170.77 ml  Output 270 ml  Net -99.23 ml    Filed Weights   10/03/22 0300  Weight: 62.9 kg    Examination: No distress Mild chemosis R eye +muscle wasting Fistula good thrill Moves to command Lungs clear  Resolved Hospital Problem list     Assessment & Plan:  Obstructive shock secondary to cardiac tamponade: resolved with drain Acute hypoxemic resp failure- due to low flow state, nauseous on BIPAP: improved ESRD on HD- due today Hx Afib on AC- to resume today 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  Path pending R eye irritation- will try some visine  - DC abx, stress steroids, PPI, amio gtt - Start PTA amio PO, eliquis - Check autoimmune panel, f/u pericardial fluid path - PT/OT, progressive mobility, IS  Best Practice (right click and "Reselect all SmartList Selections" daily)   Diet/type: renal diet DVT prophylaxis: resume eliquis GI prophylaxis: PPI Lines: N/A Foley:  N/A Code Status:  full code Last date of multidisciplinary goals of care discussion [updated patient at bedside]  Keep in ICU while has pericardial drain  Myrla Halsted MD PCCM

## 2022-10-03 NOTE — Progress Notes (Signed)
eLink Physician-Brief Progress Note Patient Name: [name] DOB: [birth year] MRN: [id number]   Date of Service  10/03/2022  HPI/Events of Note  Bedside RN concerned about respiratory distress, patient denies feeling short of breath, respiratory rate 16, saturation 97 %.  eICU Interventions  No intervention at this time        Migdalia Dk 10/03/2022, 3:44 AM

## 2022-10-03 NOTE — Progress Notes (Signed)
Dr Valentino Nose on call nephrologist updated on patient's status on dialysis - B/P 91/51. Received orders to give 25%Albumin 25G x 1; Midodrine 10 mg x 1.

## 2022-10-04 DIAGNOSIS — I314 Cardiac tamponade: Secondary | ICD-10-CM | POA: Diagnosis not present

## 2022-10-04 DIAGNOSIS — R0602 Shortness of breath: Secondary | ICD-10-CM | POA: Diagnosis not present

## 2022-10-04 DIAGNOSIS — E440 Moderate protein-calorie malnutrition: Secondary | ICD-10-CM | POA: Insufficient documentation

## 2022-10-04 LAB — ANTI-SCLERODERMA ANTIBODY: Scleroderma (Scl-70) (ENA) Antibody, IgG: <0.2 AI (ref 0.0–0.9)

## 2022-10-04 LAB — CBC
HCT: 30 % — ABNORMAL LOW (ref 36.0–46.0)
Hemoglobin: 9.9 g/dL — ABNORMAL LOW (ref 12.0–15.0)
MCH: 30.3 pg (ref 26.0–34.0)
MCHC: 33 g/dL (ref 30.0–36.0)
MCV: 91.7 fL (ref 80.0–100.0)
Platelets: 263 10*3/uL (ref 150–400)
RBC: 3.27 MIL/uL — ABNORMAL LOW (ref 3.87–5.11)
RDW: 14.6 % (ref 11.5–15.5)
WBC: 7.6 10*3/uL (ref 4.0–10.5)
nRBC: 0 % (ref 0.0–0.2)

## 2022-10-04 LAB — RENAL FUNCTION PANEL
Albumin: 3.2 g/dL — ABNORMAL LOW (ref 3.5–5.0)
Anion gap: 14 (ref 5–15)
BUN: 25 mg/dL — ABNORMAL HIGH (ref 8–23)
CO2: 26 mmol/L (ref 22–32)
Calcium: 8.9 mg/dL (ref 8.9–10.3)
Chloride: 96 mmol/L — ABNORMAL LOW (ref 98–111)
Creatinine, Ser: 4.06 mg/dL — ABNORMAL HIGH (ref 0.44–1.00)
GFR, Estimated: 11 mL/min — ABNORMAL LOW (ref >60)
Glucose, Bld: 132 mg/dL — ABNORMAL HIGH (ref 70–99)
Phosphorus: 2.7 mg/dL (ref 2.5–4.6)
Potassium: 3.3 mmol/L — ABNORMAL LOW (ref 3.5–5.1)
Sodium: 136 mmol/L (ref 135–145)

## 2022-10-04 LAB — ALDOLASE: Aldolase: 7.6 U/L (ref 3.3–10.3)

## 2022-10-04 LAB — CYCLIC CITRUL PEPTIDE ANTIBODY, IGG/IGA: CCP Antibodies IgG/IgA: 17 units (ref 0–19)

## 2022-10-04 LAB — CULTURE, BODY FLUID W GRAM STAIN -BOTTLE

## 2022-10-04 LAB — C4 COMPLEMENT: Complement C4, Body Fluid: 22 mg/dL (ref 12–38)

## 2022-10-04 LAB — C3 COMPLEMENT: C3 Complement: 100 mg/dL (ref 82–167)

## 2022-10-04 LAB — RHEUMATOID FACTOR: Rheumatoid fact SerPl-aCnc: <10 IU/mL (ref <14.0)

## 2022-10-04 MED ORDER — SENNA 8.6 MG PO TABS
2.0000 | ORAL_TABLET | Freq: Every day | ORAL | Status: DC
  Administered 2022-10-04: 17.2 mg via ORAL
  Filled 2022-10-04 (×2): qty 2

## 2022-10-04 MED ORDER — POTASSIUM CHLORIDE CRYS ER 20 MEQ PO TBCR: 20.0000 meq | EXTENDED_RELEASE_TABLET | ORAL | Status: DC

## 2022-10-04 MED ORDER — POLYETHYLENE GLYCOL 3350 17 G PO PACK
17.0000 g | PACK | Freq: Two times a day (BID) | ORAL | Status: DC
  Administered 2022-10-04 – 2022-10-05 (×3): 17 g via ORAL
  Filled 2022-10-04 (×6): qty 1

## 2022-10-04 NOTE — Progress Notes (Signed)
Pharmacy ICU Bowel Regimen Consult Note   Current Inpatient Medications for Bowel Management:  Docusate 100 mg po BID  Miralax 17g po daily   Assessment: Gloria Gallagher is a 73 y.o. year old female admitted on 10/01/2022. Constipation identified as non-opioid-induced constipation . LBM on 09/30/22.  [x]  Bowel sounds present (hypoactive) [x]  No abdominal tenderness  [x]  Passing gas   Plan: Start Senna 17.2 mg po QHS Increase Miralax 17g po to BID  Thank you for allowing pharmacy to participate in this patient's care.  Andreas Ohm, PharmD Pharmacy Resident  10/04/2022 8:43 AM

## 2022-10-04 NOTE — Evaluation (Signed)
Occupational Therapy Evaluation Patient Details Name: Gloria Gallagher MRN: [id number] DOB: Jan 29, 1951 Today's Date: 10/04/2022   History of Present Illness 72 yo female adm 5/20 with SOB, acute hypoxic respiratory failure. 5/21 cardiac tamponade with emergent intubation and pericardiocentesis with drain, extubated same day. PMHx: ESRD on HD (MWF), HTN, aortic insufficiency (mod-severe), Afib, anemia, gout, and GERD   Clinical Impression   Patient admitted for the diagnosis above.  PTA she lives with her sister, who will occasionally assist her, but generally she is independent with ADL and iADL.  She relies on public transportation for dialysis, and was not using an AD for mobility.  Currently she is needing only generalized supervision for ADL completion and in room mobility.  OT will continue to follow her in the acute setting to address deficits, but no post acute OT is anticipated.       Recommendations for follow up therapy are one component of a multi-disciplinary discharge planning process, led by the attending physician.  Recommendations may be updated based on patient status, additional functional criteria and insurance authorization.   Assistance Recommended at Discharge Set up Supervision/Assistance  Patient can return home with the following Assist for transportation    Functional Status Assessment  Patient has had a recent decline in their functional status and demonstrates the ability to make significant improvements in function in a reasonable and predictable amount of time.  Equipment Recommendations  None recommended by OT    Recommendations for Other Services       Precautions / Restrictions Precautions Precautions: Fall;Other (comment) Precaution Comments: hemovac pericardial drain Restrictions Weight Bearing Restrictions: No      Mobility Bed Mobility               General bed mobility comments: in chair on arrival and end of session     Transfers Overall transfer level: Needs assistance Equipment used: None Transfers: Sit to/from Stand Sit to Stand: Supervision                  Balance Overall balance assessment: Mild deficits observed, not formally tested                                         ADL either performed or assessed with clinical judgement   ADL Overall ADL's : Needs assistance/impaired     Grooming: Oral care;Supervision/safety;Standing               Lower Body Dressing: Supervision/safety;Sit to/from stand   Toilet Transfer: Supervision/safety;Ambulation;Regular Toilet                   Vision Patient Visual Report: No change from baseline       Perception     Praxis      Pertinent Vitals/Pain Pain Assessment Pain Assessment: No/denies pain     Hand Dominance Right   Extremity/Trunk Assessment Upper Extremity Assessment Upper Extremity Assessment: Generalized weakness   Lower Extremity Assessment Lower Extremity Assessment: Defer to PT evaluation   Cervical / Trunk Assessment Cervical / Trunk Assessment: Normal   Communication Communication Communication: No difficulties   Cognition Arousal/Alertness: Awake/alert Behavior During Therapy: WFL for tasks assessed/performed Overall Cognitive Status: Within Functional Limits for tasks assessed  General Comments  reaching for objects in her environment for stability    Exercises     Shoulder Instructions      Home Living Family/patient expects to be discharged to:: Private residence Living Arrangements: Other relatives (sister) Available Help at Discharge: Family;Available PRN/intermittently Type of Home: House Home Access: Level entry     Home Layout: Two level;Able to live on main level with bedroom/bathroom;1/2 bath on main level   Alternate Level Stairs-Rails: Right Bathroom Shower/Tub: Walk-in shower;Sponge bathes at  baseline   Bathroom Toilet: Standard Bathroom Accessibility: Yes How Accessible: Accessible via walker Home Equipment: Cane - single Information systems manager (2 wheels)   Additional Comments: Plan is for pt to stay downstairs      Prior Functioning/Environment Prior Level of Function : Independent/Modified Independent             Mobility Comments: cane intermittently in house mostly in community ADLs Comments: ADL independently, sponge bathes, sister and pt share IADLs        OT Problem List: Decreased strength;Decreased activity tolerance;Impaired balance (sitting and/or standing)      OT Treatment/Interventions: Self-care/ADL training;Therapeutic activities;DME and/or AE instruction;Patient/family education;Balance training    OT Goals(Current goals can be found in the care plan section) Acute Rehab OT Goals Patient Stated Goal: Return home OT Goal Formulation: With patient Time For Goal Achievement: 10/18/22 Potential to Achieve Goals: Good ADL Goals Pt Will Perform Grooming: with modified independence;standing Pt Will Perform Lower Body Dressing: with modified independence;sit to/from stand Pt Will Transfer to Toilet: with modified independence;ambulating;regular height toilet Pt/caregiver will Perform Home Exercise Program: Increased strength;Both right and left upper extremity;With theraband;With Supervision  OT Frequency: Min 2X/week    Co-evaluation              AM-PAC OT "6 Clicks" Daily Activity     Outcome Measure Help from another person eating meals?: None Help from another person taking care of personal grooming?: A Little Help from another person toileting, which includes using toliet, bedpan, or urinal?: A Little Help from another person bathing (including washing, rinsing, drying)?: A Little Help from another person to put on and taking off regular upper body clothing?: None Help from another person to put on and taking off regular lower  body clothing?: A Little 6 Click Score: 20   End of Session Equipment Utilized During Treatment: Gait belt Nurse Communication: Mobility status  Activity Tolerance: Patient tolerated treatment well Patient left: in chair;with call bell/phone within reach  OT Visit Diagnosis: Unsteadiness on feet (R26.81)                Time: 6045-4098 OT Time Calculation (min): 21 min Charges:  OT General Charges $OT Visit: 1 Visit OT Evaluation $OT Eval Moderate Complexity: 1 Mod  10/04/2022  RP, OTR/L  Acute Rehabilitation Services  Office:  [phone]   Suzanna Obey 10/04/2022, 2:43 PM

## 2022-10-04 NOTE — Progress Notes (Signed)
Rounding Note    Patient Name: Gloria Gallagher Date of Encounter: 10/04/2022  [facility] Cardiologist: Kristeen Miss, MD   Subjective   Status post emergent pericardiocentesis for tamponade 2 days ago.  Currently off pressors and O2 and hemodynamically stable.  Denies chest pain or shortness of breath.  Inpatient Medications    Scheduled Meds:  amiodarone  200 mg Oral Daily   apixaban  5 mg Oral BID   atorvastatin  20 mg Oral Daily   Chlorhexidine Gluconate Cloth  6 each Topical Q0600   docusate sodium  100 mg Oral BID   ferric citrate  210 mg Oral TID WC   multivitamin  1 tablet Oral QHS   polyethylene glycol  17 g Oral BID   senna  2 tablet Oral QHS   sodium chloride flush  3 mL Intravenous Q12H   Continuous Infusions:  sodium chloride     sodium chloride 250 mL (10/02/22 2115)   sodium chloride     promethazine (PHENERGAN) injection (IM or IVPB) Stopped (10/02/22 0607)   PRN Meds: Place/Maintain arterial line **AND** sodium chloride, sodium chloride, alteplase, alum & mag hydroxide-simeth, heparin, lidocaine (PF), lidocaine-prilocaine, naphazoline-glycerin, mouth rinse, pentafluoroprop-tetrafluoroeth, promethazine (PHENERGAN) injection (IM or IVPB), sodium chloride flush   Vital Signs    Vitals:   10/04/22 0600 10/04/22 0735 10/04/22 0756 10/04/22 0800  BP: 105/65     Pulse: 88 87 86   Resp: (!) 27  (!) 24   Temp:    97.6 F (36.4 C)  TempSrc:    Oral  SpO2: (!) 83% 98% 96%   Weight:        Intake/Output Summary (Last 24 hours) at 10/04/2022 0844 Last data filed at 10/03/2022 2345 Gross per 24 hour  Intake 538.04 ml  Output 3200 ml  Net -2661.96 ml      10/03/2022    3:00 AM 09/25/2022    2:54 AM 03/20/2022    7:51 AM  Last 3 Weights  Weight (lbs) 138 lb 10.7 oz 156 lb 8.4 oz 157 lb 3.2 oz  Weight (kg) 62.9 kg 71 kg 71.305 kg      Telemetry    A-fib with slow ventricular response personally Reviewed  ECG    Not performed  today  Physical Exam   GEN: On nonrebreather and respiratory distress Neck: No JVD Cardiac: RRR, no murmurs, rubs, or gallops.  Respiratory: Clear to auscultation bilaterally. GI: Soft, nontender, non-distended  MS: No edema; No deformity. Neuro:  Nonfocal  Psych: Normal affect   Labs    High Sensitivity Troponin:   Recent Labs  Lab 09/25/22 0310 09/25/22 0510 10/01/22 1330 10/01/22 1333  TROPONINIHS 39* 37* 43* 43*     Chemistry Recent Labs  Lab 10/01/22 1333 10/01/22 1338 10/02/22 0302 10/02/22 0315 10/03/22 0305 10/04/22 0043  NA 137   < > 134* 132* 133* 136  K 3.7   < > 3.8 3.8 4.6 3.3*  CL 90*   < > 92*  --  91* 96*  CO2 30  --  23  --  23 26  GLUCOSE 121*   < > 180*  --  134* 132*  BUN 19   < > 26*  --  47* 25*  CREATININE 4.62*   < > 4.81*  --  7.41* 4.06*  CALCIUM 8.4*  --  8.6*  --  8.5* 8.9  MG 2.0  --  1.8  --  2.1  --   PROT  7.7  --   --   --   --   --   ALBUMIN 3.2*  --  2.8*  --   --  3.2*  AST 20  --   --   --   --   --   ALT 15  --   --   --   --   --   ALKPHOS 110  --   --   --   --   --   BILITOT 0.6  --   --   --   --   --   GFRNONAA 9*  --  9*  --  5* 11*  ANIONGAP 17*  --  19*  --  19* 14   < > = values in this interval not displayed.    Lipids No results for input(s): "CHOL", "TRIG", "HDL", "LABVLDL", "LDLCALC", "CHOLHDL" in the last 168 hours.  Hematology Recent Labs  Lab 10/02/22 0302 10/02/22 0315 10/03/22 0305 10/04/22 0043  WBC 9.3  --  11.2* 7.6  RBC 3.45*  --  3.36* 3.27*  HGB 10.5* 11.6* 10.1* 9.9*  HCT 33.1* 34.0* 31.7* 30.0*  MCV 95.9  --  94.3 91.7  MCH 30.4  --  30.1 30.3  MCHC 31.7  --  31.9 33.0  RDW 14.6  --  14.6 14.6  PLT 236  --  298 263   Thyroid No results for input(s): "TSH", "FREET4" in the last 168 hours.  BNP Recent Labs  Lab 10/01/22 1333 10/02/22 0302  BNP 473.5* 546.5*    DDimer No results for input(s): "DDIMER" in the last 168 hours.   Radiology    ECHOCARDIOGRAM LIMITED  Result Date:  10/02/2022    ECHOCARDIOGRAM LIMITED REPORT   Patient Name:   JAMYLA MONTERA Date of Exam: 10/02/2022 Medical Rec #:  [id number]         Height:       65.0 in Accession #:    [id number]        Weight:       156.5 lb Date of Birth:  10-06-1950        BSA:          1.782 m Patient Age:    71 years          BP:           60/40 mmHg Patient Gender: F                 HR:           73 bpm. Exam Location:  Inpatient Procedure: Limited Echo, Color Doppler, Cardiac Doppler and Echo            Guidance/Pericardial Tap Indications:    I31.3 Pericardial effusion  History:        Patient has prior history of Echocardiogram examinations, most                 recent 10/02/2022. Arrythmias:Atrial Fibrillation; Risk                 Factors:Hypertension.  Sonographer:    Irving Burton Senior RDCS Referring Phys: Verne Carrow, D  Sonographer Comments: Pericardiocentesis  Conclusion(s)/Recommendation(s): Periprocedural echo for pericardiocentesis. The postprocedural images show a small residual effusion and resolution of RV and RA diastolic collapse and reduction in heart rate. There is a small residual pericardial effusion anterolateral to the left ventricle. Fibrinous strands are seen in that area and the effusion may be partially loculated. The  vast majority of the effusion is successfully drained. FINDINGS  Aortic Valve: Aortic regurgitation PHT measures 503 msec. Additional Comments: Spectral Doppler performed. Color Doppler performed.  AORTIC VALVE AI PHT:      503 msec Thurmon Fair MD Electronically signed by Thurmon Fair MD Signature Date/Time: 10/02/2022/10:20:26 AM    Final    CARDIAC CATHETERIZATION  Result Date: 10/02/2022 Pericardial tamponade Successful pericardiocentesis with removal of 950 cc of bloody fluid Recommendations: Will leave pericardial drain in place for next 24-48 hours. Fluid sent to lab for analysis.    Cardiac Studies   2D echocardiogram post pericardiocentesis  (10/02/2022)  Conclusion(s)/Recommendation(s): Periprocedural echo for  pericardiocentesis. The postprocedural images show a small residual  effusion and resolution of RV and RA diastolic collapse and reduction in  heart rate. There is a small residual pericardial  effusion anterolateral to the left ventricle. Fibrinous strands are seen  in that area and the effusion may be partially loculated. The vast  majority of the effusion is successfully drained.   Patient Profile      ELODIA ROCHEFORT is a 72 y.o. female with a hx of ESRD on HD, prior MI, aortic insufficiency, HTN who is being seen today for the evaluation of pericardial effusion at the request of critical care medicine   Assessment & Plan    1: Pericardial tamponade-patient had tamponade physiology with tachycardia and hypotension requiring pressors.  She was intubated and given to age and taken urgently to the Cath Lab where Dr. Clifton James performed a subxiphoid pericardiocentesis removing approximately a liter of bloody fluid.  She has had no fluid out in her drain over the last 24 hours.  Fluid was sent for lab analysis.  Gram stain was negative.  Her pressor requirement requirement has resolved.  Her vital signs are stable.  She was successfully extubated.  She had hemodialysis yesterday without event.  I withdrew her pericardial drain this morning.  Probably should have a repeat 2D echo in several weeks to assess for reaccumulation.  Will sign off for now.  For questions or updates, please contact [facility] Please consult [email or web address] for contact info under        Signed, Nanetta Batty, MD  10/04/2022, 8:44 AM

## 2022-10-04 NOTE — Progress Notes (Addendum)
   10/03/22 2345  Pain Assessment  Pain Scale 0-10  Pain Score 0  Fistula / Graft Left Upper arm Arteriovenous fistula  No placement date or time found.   Orientation: Left  Access Location: Upper arm  Access Type: (c) Arteriovenous fistula  Site Condition No complications  Fistula / Graft Assessment Present;Thrill;Bruit  Status Deaccessed (Pressure gauze dry and intact)  Drainage Description None  Neurological  Level of Consciousness Alert  Orientation Level Oriented X4  Respiratory  Respiratory Pattern Regular;Unlabored  Chest Assessment Chest expansion symmetrical  Bilateral Breath Sounds Diminished  Cardiac  Pulse Irregular  Heart Sounds S1, S2  Jugular Venous Distention (JVD) Yes  ECG Monitor Yes  Cardiac Rhythm Atrial fibrillation  GU Assessment  Genitourinary (WDL) X  Genitourinary Symptoms Anuria  Psychosocial  Psychosocial (WDL) WDL   Received patient in bed Alert and oriented. - yes x 4 Informed consent signed and in chart. - yes  TX duration:3.45  Patient tolerated well. - yes  Alert, without acute distress Hand-off given to patient's nurse. - yes Alesia Richards RN  Access used: LUA AVF Access issues: None  Total UF removed: 3.2L Medication(s) given: 25%Albumin 12.5G x 2 Post HD VS: see flowsheet Post HD weight: 62.8kg   Raylene Carmickle A [facility]

## 2022-10-04 NOTE — Progress Notes (Signed)
Report called to [facility] Lowella Bandy). Patient transported with belongings at this time.

## 2022-10-04 NOTE — Progress Notes (Signed)
Pt receives out-pt HD at [facility] on MWF. Will assist as needed.   Noam Franzen Renal Navigator [phone] 

## 2022-10-04 NOTE — Progress Notes (Signed)
NAME:  Gloria Gallagher, MRN:  [id number], DOB:  11/09/1950, LOS: 3 ADMISSION DATE:  10/01/2022, CONSULTATION DATE:  5/20 REFERRING MD:  Dr. Gloris Manchester, CHIEF COMPLAINT:  Dyspnea  History of Present Illness:  Gloria Gallagher is a 72 year old female with PMHx of ESRD on HD (MWF), HTN, aortic insufficiency (mod-severe), afib, anemia, gout, and GERD who presented to the ED with SOB after desatting to 80% undergoing HD. Presented to ED 6 days prior with similar complaints of worsening DOE, orthopnea, and left-sided chest pain.   On ED arrival, was tachycardiac and tachypneic but normotensive on 4L [place]. Progressed to BiPAP with continued use of accessory muscles and work of breathing. CT chest on 5/14 was negative for PE, but had marked ectasia of main pulm artery suggesting pHTN with reflux into hepatic veins. 5/20 ED eval pertinent for CXR with left basilar volume loss, BNP 473, Na 134, Hgb 11.7, and stable trop. Presentation concerning for pericardial effusion and admitted to ICU for evaluation and management of acute hypoxic respiratory failure.  Pertinent  Medical History  ESRD with HD (MWF) Afib Aortic insufficiency (mod to severe) HTN HLD Anemia Gout GERD CAD Hyperparathyroidism Neuropathy Seasonal allergies  Significant Hospital Events: Including procedures, antibiotic start and stop dates in addition to other pertinent events   5/20 presented to ED w/ cc SOB in spite of full iHD treatment. Hypotensive overnight (MAP 53), requiring 2 pressor shock during emergent dialysis. Started on vanc/cefepime. Unable to wean off BiPAP 5/21 worsening respiratory status, echocardiogram with cardiac tamponade. Emergent intubation and pericardiocentesis with drain placement. Extubated same day with respiratory improvement.  5/23 Drain removal, no fluid output in 24 hours.   Interim History / Subjective:  HD went well yesterday. Weaned to Room Air this AM, sating well. Patient feels improved,  working with PT. Requests to stay in hospital due to not feeling at total baseline. Drain removed this AM, no output yesterday. ROS negative.   Objective   Blood pressure (!) 96/50, pulse 84, temperature 97.6 F (36.4 C), temperature source Oral, resp. rate (!) 26, weight 62.9 kg, SpO2 96 %.     Intake/Output Summary (Last 24 hours) at 10/04/2022 0748 Last data filed at 10/03/2022 2345 Gross per 24 hour  Intake 538.04 ml  Output 3200 ml  Net -2661.96 ml    Filed Weights   10/03/22 0300  Weight: 62.9 kg    Examination: General: Sitting upright in chair, conversant and comfortable, NAD HENT: NCAT, missing bottom teeth Neck: no JVD Lungs: CTAB, diminished bases, no wheezes, rhonchi, or rales, normal WOB Cardiovascular: RRR, no murmur or rub appreciated Abdomen: Soft, non-tender Extremities: Warm, dry, no edema, AV fistula in left arm Neuro: No focal deficits appreciated, CN II-XII grossly intact   Assessment & Plan:  Gloria Gallagher is a 72 year old female with PMHx of ESRD on HD (MWF), HTN, aortic insufficiency (mod-severe), afib, anemia, gout, and GERD who is admitted for management of acute hypoxic respiratory failure 2/2 cardiac tamponade  Obstructive shock 2/2 Cardiac tamponade, resolved S/p emergent pericardiocentesis. Total ~1L of serosanguinous fluid removed with drain, none in the last 24 hours. Removed by cardiology this AM. Stable MAP and continued resolution of symptoms. Etiology of pericardial effusion unknown, but seems unlikely to be uremic or infectious.  Fluid studies sent out with pathology. Culture NGTD, gram stain without organisms, and prior TBT 07/2022 negative in setting of no recent infection. ESR/CRP/Ferritin elevated but nonspecific. Autoimmune labs sent to rule out  involvement, but reassuring so far. Given CT imaging (below) and history, concern for malignancy - Cardiology and nephrology following - Continue following pericardial fluid pathology and  autoimmune labs - Will need repeat echocardiogram in following weeks to assess reaccumulation and needs to have good follow up - PT/OT  Multiple hepatic lesions and renal cysts CTA on arrival with multiple low-density lesions in liver with calcifications and numerous cysts of bilateral kidneys. Per chart review, referred for CT and colon cancer screening in 2023 due to known hepatic lesion. In discussion with patient/family, never received screening (refusal) and has no knowledge of prior liver/kidney disease including polycystic kidney disease.   Acute hypoxic respiratory failure, improved Respiratory failure likely secondary to cardiac tamponade. No oxygen requirement at home - Currently on RA with spo2 >92% - Incentive spirometry  ESRD on HD  Hyponatremia - HD on MWF schedule - Monitor/replete electrolytes as needed  Afib  - home Eliqius 200 qd  Aortic insufficiency  Echo with moderate AV regurg and aortic dilation - CTM; cardiology following  HTN  HLD - home statin  Anemia Hgb drop to 9.9 this AM, likely secondary to blood/fluid removal. - home ferric citrate.  - CTM  GERD  - prns if needed  R eye dryness - eye drops prn  Constipation No BM since 5/19. Advanced bowel regimen to miralax, senna, and docusate per pharmacy   Best Practice (right click and "Reselect all SmartList Selections" daily)   Diet/type: renal diet DVT prophylaxis: home Eliquis GI prophylaxis: none Lines: No longer needed.  Order written to d/c  Foley:  N/A Code Status:  full code  Labs   CBC: Recent Labs  Lab 10/01/22 1333 10/01/22 1338 10/02/22 0302 10/02/22 0315 10/03/22 0305 10/04/22 0043  WBC 8.7  --  9.3  --  11.2* 7.6  NEUTROABS 6.6  --   --   --   --   --   HGB 11.7* 12.9  12.9 10.5* 11.6* 10.1* 9.9*  HCT 37.4 38.0  38.0 33.1* 34.0* 31.7* 30.0*  MCV 96.9  --  95.9  --  94.3 91.7  PLT 223  --  236  --  298 263     Basic Metabolic Panel: Recent Labs  Lab  10/01/22 1333 10/01/22 1338 10/02/22 0302 10/02/22 0315 10/03/22 0305 10/03/22 0957 10/04/22 0043  NA 137 134*  134* 134* 132* 133*  --  136  K 3.7 3.6  3.6 3.8 3.8 4.6  --  3.3*  CL 90* 91* 92*  --  91*  --  96*  CO2 30  --  23  --  23  --  26  GLUCOSE 121* 123* 180*  --  134*  --  132*  BUN 19 25* 26*  --  47*  --  25*  CREATININE 4.62* 4.70* 4.81*  --  7.41*  --  4.06*  CALCIUM 8.4*  --  8.6*  --  8.5*  --  8.9  MG 2.0  --  1.8  --  2.1  --   --   PHOS  --   --  4.1  --   --  6.3* 2.7    GFR: Estimated Creatinine Clearance: 11.1 mL/min (A) (by C-G formula based on SCr of 4.06 mg/dL (H)). Recent Labs  Lab 10/01/22 1333 10/02/22 0302 10/03/22 0305 10/04/22 0043  WBC 8.7 9.3 11.2* 7.6     Liver Function Tests: Recent Labs  Lab 10/01/22 1333 10/02/22 0302 10/04/22 0043  AST 20  --   --  ALT 15  --   --   ALKPHOS 110  --   --   BILITOT 0.6  --   --   PROT 7.7  --   --   ALBUMIN 3.2* 2.8* 3.2*    No results for input(s): "LIPASE", "AMYLASE" in the last 168 hours. No results for input(s): "AMMONIA" in the last 168 hours.  ABG    Component Value Date/Time   PHART 7.413 10/02/2022 0315   PCO2ART 40.1 10/02/2022 0315   PO2ART 463 (H) 10/02/2022 0315   HCO3 25.6 10/02/2022 0315   TCO2 27 10/02/2022 0315   O2SAT 100 10/02/2022 0315     Coagulation Profile: No results for input(s): "INR", "PROTIME" in the last 168 hours.  Cardiac Enzymes: Recent Labs  Lab 10/03/22 0957  CKTOTAL 47    HbA1C: Hgb A1c MFr Bld  Date/Time Value Ref Range Status  01/18/2022 02:26 AM 5.3 4.8 - 5.6 % Final    Comment:    (NOTE) Pre diabetes:          5.7%-6.4%  Diabetes:              >6.4%  Glycemic control for   <7.0% adults with diabetes     CBG: No results for input(s): "GLUCAP" in the last 168 hours.   Past Medical History:  She,  has a past medical history of Anemia, Aortic atherosclerosis (HCC), Arthritis, Atrial fibrillation (HCC), CAD (coronary artery  disease), COVID (2021), Dialysis patient (HCC), ESRD on hemodialysis (HCC), GERD (gastroesophageal reflux disease), Gout, Heart murmur, Hyperparathyroidism (HCC), Hypertension, Neuromuscular disorder (HCC), Pneumonia, and Seasonal allergies.   Surgical History:   Past Surgical History:  Procedure Laterality Date   CARDIOVERSION N/A 05/16/2021   Procedure: CARDIOVERSION;  Surgeon: Nahser, Deloris Ping, MD;  Location: [facility] ENDOSCOPY;  Service: Cardiovascular;  Laterality: N/A;   CARDIOVERSION N/A 08/01/2021   Procedure: CARDIOVERSION;  Surgeon: Thurmon Fair, MD;  Location: [facility] ENDOSCOPY;  Service: Cardiovascular;  Laterality: N/A;   CARDIOVERSION N/A 12/05/2021   Procedure: CARDIOVERSION;  Surgeon: Thomasene Ripple, DO;  Location: [facility] ENDOSCOPY;  Service: Cardiovascular;  Laterality: N/A;   DIALYSIS FISTULA CREATION Left    PERICARDIOCENTESIS N/A 10/02/2022   Procedure: PERICARDIOCENTESIS;  Surgeon: Kathleene Hazel, MD;  Location: [facility] INVASIVE CV LAB;  Service: Cardiovascular;  Laterality: N/A;   REVISON OF ARTERIOVENOUS FISTULA Left 02/16/2022   Procedure: LEFT ARM ARTERIOVENOUS FISTULA REVISION;  Surgeon: Leonie Douglas, MD;  Location: [facility] OR;  Service: Vascular;  Laterality: Left;  PERIPHERAL NERVE BLOCK   TUBAL LIGATION       Social History:   reports that she has quit smoking. Her smoking use included cigarettes. She has never been exposed to tobacco smoke. She has never used smokeless tobacco. She reports current alcohol use of about 1.0 standard drink of alcohol per week. She reports that she does not use drugs.   Family History:  Her family history includes Diabetes in her sister. There is no history of Stomach cancer or Colon cancer.   Allergies Allergies  Allergen Reactions   Penicillin G Hives and Itching   Shrimp (Diagnostic) Hives     Home Medications  Prior to Admission medications   Medication Sig Start Date End Date Taking? Authorizing Provider  acetaminophen (TYLENOL)  500 MG tablet Take 2 tablets (1,000 mg total) by mouth every 6 (six) hours as needed for mild pain or moderate pain. 01/19/22   Alfredo Martinez, MD  amiodarone (PACERONE) 200 MG tablet Take 1 tablet (200  mg total) by mouth daily. 10/19/21   Fenton, Clint R, PA  apixaban (ELIQUIS) 5 MG TABS tablet TAKE 1 TABLET BY MOUTH TWICE A DAY 06/22/22   Nahser, Deloris Ping, MD  atorvastatin (LIPITOR) 20 MG tablet Take 20 mg by mouth daily. 12/05/21   [provider]  diclofenac Sodium (VOLTAREN) 1 % GEL Apply 4 g topically 4 (four) times daily. 01/14/22   Melene Plan, DO  Doxercalciferol (HECTOROL IV) Inject 1 Dose into the vein every Monday, Wednesday, and Friday with hemodialysis. During dialysis, 3X week 11/08/21 11/07/22  [provider]  ferric citrate (AURYXIA) 1 GM 210 MG(Fe) tablet Take 1 tablet (210 mg total) by mouth 3 (three) times daily with meals. 01/19/22   Alfredo Martinez, MD  oxyCODONE-acetaminophen (PERCOCET/ROXICET) 5-325 MG tablet Take 1 tablet by mouth every 6 (six) hours as needed. 02/16/22   Lars Mage, PA-C  Urea (UREA NAIL) 45 % GEL Apply to affected nail daily 08/08/22   Louann Sjogren, DPM    Adolph Pollack, MS4

## 2022-10-04 NOTE — Progress Notes (Signed)
Patient ID: Gloria Gallagher, female   DOB: 18-Sep-1950, 72 y.o.   MRN: [id number] [facility] Progress Note   Assessment/ Plan:   1.  Acute hypoxic respiratory failure: Appears to be from multiple factors including pulmonary edema but largely from pericardial effusion/tamponade with significant improvement seen status post pericardiocentesis.  Volume status appears to be well-controlled based on physical exam/intradialytic hypotension with aggressive UF noted yesterday.  Serologies so far have been negative for autoimmune associated pericardial effusion. 2.  End-stage renal disease: Continue hemodialysis Monday/Wednesday/Friday schedule with next hemodialysis treatment ordered for tomorrow.  Yesterday attempted to aggressively lower her dry weight with intradialytic hypotension seen with need to revise goal. 3.  Hypokalemia: Secondary to dialysis/associated losses and reduced intake.  Will replace via oral route. 4.  Anemia: Hemoglobin and hematocrit borderline low but acceptable, continue to monitor trend to decide on need to restart ESA. 5.  Secondary hyperparathyroidism: Continue renal diet and will follow phosphorus trend to decide on need to resume binders.  Will reconcile medications for vitamin D receptor analog/calcimimetic. 6.  Atrial fibrillation: Apixaban transiently held status post pericardiocentesis, restarted yesterday.  She remains rate controlled and currently hemodynamic stable after earlier suffering obstructive shock secondary to pericardial effusion.  Subjective:   Reports to be feeling better, just returned from walking a few laps within the ICU with assistance from PT.   Objective:   BP 105/65   Pulse 87   Temp 98.1 F (36.7 C) (Oral)   Resp (!) 27   Wt 62.9 kg   SpO2 98%   BMI 23.08 kg/m   Physical Exam: Gen: Sitting comfortably in recliner, nurse at bedside CVS: Pulse regular rhythm, normal rate, S1 and S2 normal.  Pericardial drain in situ Resp:  Clear to auscultation bilaterally, no distinct rales or rhonchi.  Not on oxygen supplementation Abd: Soft, flat, nontender, bowel sounds normal. Ext: No lower extremity edema, aneurysmal left upper arm AV fistula  Labs: BMET Recent Labs  Lab 10/01/22 1333 10/01/22 1338 10/02/22 0302 10/02/22 0315 10/03/22 0305 10/03/22 0957 10/04/22 0043  NA 137 134*  134* 134* 132* 133*  --  136  K 3.7 3.6  3.6 3.8 3.8 4.6  --  3.3*  CL 90* 91* 92*  --  91*  --  96*  CO2 30  --  23  --  23  --  26  GLUCOSE 121* 123* 180*  --  134*  --  132*  BUN 19 25* 26*  --  47*  --  25*  CREATININE 4.62* 4.70* 4.81*  --  7.41*  --  4.06*  CALCIUM 8.4*  --  8.6*  --  8.5*  --  8.9  PHOS  --   --  4.1  --   --  6.3* 2.7   CBC Recent Labs  Lab 10/01/22 1333 10/01/22 1338 10/02/22 0302 10/02/22 0315 10/03/22 0305 10/04/22 0043  WBC 8.7  --  9.3  --  11.2* 7.6  NEUTROABS 6.6  --   --   --   --   --   HGB 11.7*   < > 10.5* 11.6* 10.1* 9.9*  HCT 37.4   < > 33.1* 34.0* 31.7* 30.0*  MCV 96.9  --  95.9  --  94.3 91.7  PLT 223  --  236  --  298 263   < > = values in this interval not displayed.      Medications:     amiodarone  200 mg  Oral Daily   apixaban  5 mg Oral BID   atorvastatin  20 mg Oral Daily   Chlorhexidine Gluconate Cloth  6 each Topical Q0600   docusate sodium  100 mg Oral BID   ferric citrate  210 mg Oral TID WC   multivitamin  1 tablet Oral QHS   polyethylene glycol  17 g Per Tube Daily   sodium chloride flush  3 mL Intravenous Q12H   Zetta Bills, MD 10/04/2022, 7:45 AM

## 2022-10-04 NOTE — Evaluation (Signed)
Physical Therapy Evaluation Patient Details Name: Gloria Gallagher MRN: [id number] DOB: 1950/06/23 Today's Date: 10/04/2022  History of Present Illness  72 yo female adm 5/20 with SOB, acute hypoxic respiratory failure. 5/21 cardiac tamponade with emergent intubation and pericardiocentesis with drain, extubated same day. PMHx: ESRD on HD (MWF), HTN, aortic insufficiency (mod-severe), Afib, anemia, gout, and GERD  Clinical Impression  Pt pleasant, lives with sister and reports fall downstairs 2 months ago along with decreased activity tolerance recently. Pt with reliance on RW and able to maintain SPO2 on RA. Pt with decreased strength, function and gait who will benefit from acute therapy to maximize mobility, independence and safety to decrease burden of care.   SpO2 94-98% on RA HR 88       Recommendations for follow up therapy are one component of a multi-disciplinary discharge planning process, led by the attending physician.  Recommendations may be updated based on patient status, additional functional criteria and insurance authorization.  Follow Up Recommendations       Assistance Recommended at Discharge PRN  Patient can return home with the following  Assistance with cooking/housework;Direct supervision/assist for financial management;Assist for transportation;Help with stairs or ramp for entrance    Equipment Recommendations None recommended by PT  Recommendations for Other Services       Functional Status Assessment Patient has had a recent decline in their functional status and/or demonstrates limited ability to make significant improvements in function in a reasonable and predictable amount of time     Precautions / Restrictions Precautions Precautions: Fall;Other (comment) Precaution Comments: hemovac pericardial drain      Mobility  Bed Mobility               General bed mobility comments: in chair on arrival and end of session    Transfers Overall  transfer level: Modified independent                      Ambulation/Gait Ambulation/Gait assistance: Supervision Gait Distance (Feet): 170 Feet Assistive device: Rolling walker (2 wheels) Gait Pattern/deviations: Step-through pattern, Decreased stride length   Gait velocity interpretation: <1.8 ft/sec, indicate of risk for recurrent falls   General Gait Details: pt initially walking 10' without AD with pt reaching out for environmental support, use of RW with pt with one right posterior partial LOB able to recover without assist with increased reliance on RW remainder of gait. Pt with slow steady gait limited by SOB with SPO2 >92% on RA throughout  Stairs            Wheelchair Mobility    Modified Rankin (Stroke Patients Only)       Balance Overall balance assessment: Mild deficits observed, not formally tested, History of Falls (pt reports falling down stairs 2 months ago and since then moved bedroom downstairs)                                           Pertinent Vitals/Pain Pain Assessment Pain Assessment: No/denies pain    Home Living Family/patient expects to be discharged to:: Private residence Living Arrangements: Other relatives Available Help at Discharge: Family;Available PRN/intermittently Type of Home: House Home Access: Level entry       Home Layout: Two level;Able to live on main level with bedroom/bathroom;1/2 bath on main level Home Equipment: Cane - single Information systems manager (2 wheels)  Prior Function Prior Level of Function : Independent/Modified Independent             Mobility Comments: cane intermittently in house mostly in community ADLs Comments: ADL independently, sponge bathes, sister and pt share IADLs     Hand Dominance        Extremity/Trunk Assessment   Upper Extremity Assessment Upper Extremity Assessment: Overall WFL for tasks assessed    Lower Extremity Assessment Lower  Extremity Assessment: Overall WFL for tasks assessed    Cervical / Trunk Assessment Cervical / Trunk Assessment: Normal  Communication   Communication: No difficulties  Cognition Arousal/Alertness: Awake/alert Behavior During Therapy: WFL for tasks assessed/performed Overall Cognitive Status: Within Functional Limits for tasks assessed                                          General Comments      Exercises     Assessment/Plan    PT Assessment Patient needs continued PT services  PT Problem List Decreased activity tolerance;Decreased balance;Decreased knowledge of use of DME       PT Treatment Interventions DME instruction;Gait training;Stair training;Functional mobility training;Therapeutic activities;Therapeutic exercise;Balance training;Patient/family education    PT Goals (Current goals can be found in the Care Plan section)  Acute Rehab PT Goals Patient Stated Goal: return home, watch tv PT Goal Formulation: With patient Time For Goal Achievement: 10/11/22 Potential to Achieve Goals: Good    Frequency Min 1X/week     Co-evaluation               AM-PAC PT "6 Clicks" Mobility  Outcome Measure Help needed turning from your back to your side while in a flat bed without using bedrails?: None Help needed moving from lying on your back to sitting on the side of a flat bed without using bedrails?: None Help needed moving to and from a bed to a chair (including a wheelchair)?: None Help needed standing up from a chair using your arms (e.g., wheelchair or bedside chair)?: A Little Help needed to walk in hospital room?: A Little Help needed climbing 3-5 steps with a railing? : A Little 6 Click Score: 21    End of Session   Activity Tolerance: Patient tolerated treatment well Patient left: in chair;with call bell/phone within reach Nurse Communication: Mobility status PT Visit Diagnosis: Other abnormalities of gait and mobility (R26.89)     Time: 7829-5621 PT Time Calculation (min) (ACUTE ONLY): 15 min   Charges:   PT Evaluation $PT Eval Moderate Complexity: 1 Mod          Keeghan Mcintire P, PT Acute Rehabilitation Services Office: [phone]   Cristine Polio 10/04/2022, 7:51 AM

## 2022-10-04 NOTE — Progress Notes (Signed)
   NAME:  Gloria Gallagher, MRN:  [id number], DOB:  1951-02-03, LOS: 3 ADMISSION DATE:  10/01/2022, CONSULTATION DATE:  5/20 REFERRING MD:  Durwin Nora CHIEF COMPLAINT:  dyspnea    History of Present Illness:  72 year old female w/ Hx as listed below. Brought back to ER for second presentation in less than one week time w/ cc: shortness of breath. Receiving her scheduled iHD as ordered. On her last visit (5/14)  she also complained of chest pain. She had CT chest that was negative for PE, but she did have marked ectasia of main pulm artery suggesting pulmonary hypertension, w/ reflux into hepatic veins suggesting tricuspid incompetence.  Was sent home w/ Lidoderm patch. Presents again on 5/20 w/ cc: on-going/worsening shortness of breath, orthopnea and LE edema even after scheduled HD. CXR showed left basilar volume loss c/w prior film but not worse., covid neg, BNP 473 high sens trop I 43  PCCM asked to eval for on-going dyspnea   Pertinent  Medical History  ESRD (MWF), AFib, CAD, HTN, aortic atherosclerosis, COVID, GERD, Gout, hyperparathyroidism, neuropathy, seasonal allergies  Last ECHO 2023 EF 55-60% no WMA, RV systolic fxn reduced, elevated RVP, RA mildly dilated, mod to severe aortic vavle regurg,  Significant Hospital Events: Including procedures, antibiotic start and stop dates in addition to other pertinent events   5/20 presents to ER w/ cc Shortness of breath in spite of full iHD treatment  5/21 emergent pericardial drain placement for tamponade  Interim History / Subjective:  Continues to improve. HD yesterday. Ambulated 150 ft.  Objective   Blood pressure 105/65, pulse 86, temperature 97.6 F (36.4 C), temperature source Oral, resp. rate (!) 24, weight 62.9 kg, SpO2 96 %.    Vent Mode: Stand-by   Intake/Output Summary (Last 24 hours) at 10/04/2022 0843 Last data filed at 10/03/2022 2345 Gross per 24 hour  Intake 538.04 ml  Output 3200 ml  Net -2661.96 ml    Filed Weights    10/03/22 0300  Weight: 62.9 kg    Examination: No distress sitting in chair Minimal output from mediastinal drain Abd soft Ext warm LUE fistula good thrill Aox3 On RA  Resolved Hospital Problem list     Assessment & Plan:  Obstructive shock secondary to cardiac tamponade: resolved with drain Acute hypoxemic resp failure- due to low flow state, nauseous on BIPAP: improved ESRD on HD- due today Hx Afib on AC- to resume today 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  Path pending R eye irritation- will try some visine  - Amio, eliquis - f/u pericardial fluid path - f/u autoimmune labs, ferritin markedly elevated but not really pointing toward anything - PT/OT, progressive mobility, IS - may be able to leave unit later today - Will need close OP f/u and repeat echo in a week  Best Practice (right click and "Reselect all SmartList Selections" daily)   Diet/type: renal diet DVT prophylaxis: eliquis GI prophylaxis: PPI Lines: N/A Foley:  N/A Code Status:  full code Last date of multidisciplinary goals of care discussion [updated patient at bedside]  Keep in ICU while has pericardial drain  Myrla Halsted MD PCCM

## 2022-10-05 ENCOUNTER — Other Ambulatory Visit (HOSPITAL_COMMUNITY): Payer: Self-pay

## 2022-10-05 DIAGNOSIS — I3139 Other pericardial effusion (noninflammatory): Secondary | ICD-10-CM | POA: Diagnosis not present

## 2022-10-05 DIAGNOSIS — I314 Cardiac tamponade: Secondary | ICD-10-CM | POA: Diagnosis not present

## 2022-10-05 DIAGNOSIS — J9601 Acute respiratory failure with hypoxia: Secondary | ICD-10-CM | POA: Diagnosis not present

## 2022-10-05 LAB — ANCA PROFILE
Anti-MPO Antibodies: <0.2 units (ref 0.0–0.9)
Anti-PR3 Antibodies: <0.2 units (ref 0.0–0.9)
Atypical P-ANCA titer: <1:20 {titer}
C-ANCA: <1:20 {titer}
P-ANCA: <1:20 {titer}

## 2022-10-05 LAB — CBC
HCT: 30.8 % — ABNORMAL LOW (ref 36.0–46.0)
Hemoglobin: 10.1 g/dL — ABNORMAL LOW (ref 12.0–15.0)
MCH: 30.7 pg (ref 26.0–34.0)
MCHC: 32.8 g/dL (ref 30.0–36.0)
MCV: 93.6 fL (ref 80.0–100.0)
Platelets: 220 10*3/uL (ref 150–400)
RBC: 3.29 MIL/uL — ABNORMAL LOW (ref 3.87–5.11)
RDW: 14.8 % (ref 11.5–15.5)
WBC: 5.6 10*3/uL (ref 4.0–10.5)
nRBC: 0 % (ref 0.0–0.2)

## 2022-10-05 LAB — RENAL FUNCTION PANEL
Albumin: 2.8 g/dL — ABNORMAL LOW (ref 3.5–5.0)
Anion gap: 14 (ref 5–15)
BUN: 49 mg/dL — ABNORMAL HIGH (ref 8–23)
CO2: 26 mmol/L (ref 22–32)
Calcium: 8.5 mg/dL — ABNORMAL LOW (ref 8.9–10.3)
Chloride: 94 mmol/L — ABNORMAL LOW (ref 98–111)
Creatinine, Ser: 7.19 mg/dL — ABNORMAL HIGH (ref 0.44–1.00)
GFR, Estimated: 6 mL/min — ABNORMAL LOW (ref >60)
Glucose, Bld: 107 mg/dL — ABNORMAL HIGH (ref 70–99)
Phosphorus: 3.6 mg/dL (ref 2.5–4.6)
Potassium: 3.8 mmol/L (ref 3.5–5.1)
Sodium: 134 mmol/L — ABNORMAL LOW (ref 135–145)

## 2022-10-05 LAB — CANCER ANTIGEN 19-9: CA 19-9: 38 U/mL — ABNORMAL HIGH (ref 0–35)

## 2022-10-05 LAB — CA 125: Cancer Antigen (CA) 125: 50.5 U/mL — ABNORMAL HIGH (ref 0.0–38.1)

## 2022-10-05 LAB — CEA: CEA: 3.8 ng/mL (ref 0.0–4.7)

## 2022-10-05 MED ORDER — DOXERCALCIFEROL 4 MCG/2ML IV SOLN
5.0000 ug | INTRAVENOUS | Status: DC
  Administered 2022-10-05: 5 ug via INTRAVENOUS

## 2022-10-05 MED ORDER — RENA-VITE PO TABS
1.0000 | ORAL_TABLET | Freq: Every day | ORAL | 0 refills | Status: AC
  Filled 2022-10-05: qty 100, 100d supply, fill #0

## 2022-10-05 MED ORDER — HEPARIN SODIUM (PORCINE) 1000 UNIT/ML DIALYSIS: 40.0000 [IU]/kg | INTRAMUSCULAR | Status: DC | PRN

## 2022-10-05 NOTE — Progress Notes (Signed)
     Daily Progress Note Intern Pager: [phone]  Patient name: Gloria Gallagher Medical record number: [id number] Date of birth: 14-Dec-1950 Age: 72 y.o. Gender: female  Primary Care Provider: Hillery Aldo, NP Consultants: Nephro, Cards (signed off),  Code Status: Full  Pt Overview and Major Events to Date:  5/20 Admitted for SOB 5/21 Pericardiocentesis  Assessment and Plan: SW is a 71yo F w/ hx of ESRD on HD (MWF), HTN, aortic insuffieciency, afib, anemia, gout, GERD who is admitted for AHRF and pericardial effusion s/p pericardiocentesis 5/21.   * Pericardial effusion S/p pericardiocentesis and respiratory status gradually improving. Currently on 3L (baseline RA). Lung exam clear and good airmovement. Workup thus far most c/f malignancy (lung nodule, liver lesions, kidney lesions, elevated CA 125 and 19-9). Less likely autoimmune or infectious. With regard to malignancy, liver is top of differential given calcified appearance of liver lesions. Denies excessive alcohol use and prior HepatitisB and C workup negative. - Obtain quant-gold to workup potential TB - f/u TSH - Will need outpt workup including:  - Colonoscopy  - CT, PET CT, or lung biopsy in 3 months  Persistent atrial fibrillation (HCC) Rate controlled. - Cont home amiodarone and eliquis  Acute hypoxic respiratory failure (HCC) Now on 3L [place], lung exam clear.  Does not use oxygen at baseline. - Wean O2 as tolerated - Ambulate w/ pulse ox  Hepatic lesion - f/u RUQ [place]   FEN/GI: Salt restricted PPx: home eliquis Dispo:Home with home health tomorrow. Barriers include need for dialysis today.   Subjective:  Reports feeling a lot better since admission.  Confirms that she had a history of cervical cancer, s/p hysterectomy. Denies hx of hepatitis or excessive alcohol use. Reports smoking, but stopped >20years ago.   Objective: Temp:  [97 F (36.1 C)-98.2 F (36.8 C)] 98.2 F (36.8 C) (05/24 1211) Pulse Rate:   [52-195] 79 (05/24 1211) Resp:  [17-35] 18 (05/24 1211) BP: (80-123)/(43-71) 94/57 (05/24 1211) SpO2:  [66 %-100 %] 66 % (05/24 1128) Weight:  [63.6 kg-66.4 kg] 65 kg (05/24 1128) Physical Exam: General: Alert, pleasant older woman laying in dialysis bed. NAD. Cardiovascular: RRR Respiratory: CTAB, no wheezing or crackles. 3L [place].  Speaking comfortably in complete sentences. Abdomen: Soft, nontender, nondistended.  BS present. Skin: warm, well perfused  Laboratory: Most recent CBC Lab Results  Component Value Date   WBC 5.6 10/05/2022   HGB 10.1 (L) 10/05/2022   HCT 30.8 (L) 10/05/2022   MCV 93.6 10/05/2022   PLT 220 10/05/2022   Most recent BMP    Latest Ref Rng & Units 10/05/2022    3:40 AM  BMP  Glucose 70 - 99 mg/dL 147   BUN 8 - 23 mg/dL 49   Creatinine 8.29 - 1.00 mg/dL 5.62   Sodium 130 - 865 mmol/L 134   Potassium 3.5 - 5.1 mmol/L 3.8   Chloride 98 - 111 mmol/L 94   CO2 22 - 32 mmol/L 26   Calcium 8.9 - 10.3 mg/dL 8.5     Lincoln Brigham, MD 10/05/2022, 12:31 PM  PGY-1, [facility] FPTS Intern pager: [phone], text pages welcome Secure chat group [facility] Teaching Service

## 2022-10-05 NOTE — Hospital Course (Addendum)
Gloria Gallagher is a 72 y.o.female with a history of ESRD on HD, atrial fibrillation, HTN, GERD, gout, hyperparathyroidism, CAD who was admitted to the [facility] at [facility] for acute hypoxic respiratory failure. Her hospital course is detailed below:  Acute Hypoxic Respiratory Failure Presented with shortness of breath despite full HD treatment. Initially requried BiPAP in ED (baseline on RA). CTA from 6 days prior showed pulmonary hypertension. In ED, CXR with no acute pathology, CTA negative for PE but again showed evidence of pulmonary hypertension was also concerning for large pericardial effusion.  EKG negative for MI but showed A-fib.  Labs showed elevated BNP, flat troponin, hyponatremia, hypoalbuminemia, mild anemia.     Patient was admitted to ICU with urgent dialysis, continued on BiPAP, required IV amiodarone for A-fib, and was started on pressors due to MAP in 50s. Pt underwent 5/21 pericardiocentesis w/ Cardiology, drew off ~1L of serosanguinous fluid.     She was extubated on 5/21, tolerated well. On *** L [place] at time of discharge.   Cardiac Tamponade Obstructive shock (2/2 to tamponade; resolved with drain) Required emergent pericardiocentesis w/ 1L drained. ***  ESRD Nephrology followed through hospitalization. Required urgent HD on day of admission. Thereafter, followed regular MWF schedule.  Atrial fibrillation Resumed home Eliquis on 5/22.   Other chronic conditions were medically managed with home medications and formulary alternatives as necessary (***)  PCP Follow-up Recommendations:  Repeat echo in 1 weeks to monitor pericardial effusion and Cardiology follow up Needs colon cancer screening (colonoscopy) given possible malignant cause of pericarditis Recommended monitoring pulmonary nodule w/ CT, PET CT, or lung biopsy in 3 months

## 2022-10-05 NOTE — Assessment & Plan Note (Addendum)
Now on 3L , lung exam clear.  Does not use oxygen at baseline. - Wean O2 as tolerated - Ambulate w/ pulse ox

## 2022-10-05 NOTE — Progress Notes (Signed)
Contacted [facility] to advise clinic that pt may d/c this weekend if stable. Clinic advised pt may resume on Monday.   Olivia Canter Renal Navigator [phone]

## 2022-10-05 NOTE — Progress Notes (Signed)
Pt walked in halls w/o O2 and O2 sat monitoring.  O2 sat went from 100% to 80% and pt had to stop to catch her breath but the O2 sat stayed at 80% until pt back in room and O2 reapplied at 3 liters [place].

## 2022-10-05 NOTE — Plan of Care (Signed)

## 2022-10-05 NOTE — Progress Notes (Signed)
Discussed w/ pt to make appointment with PCP and Cardiologist within 1 week after discharge, pt in agreement.   Pt eating food with [place] off. Speaking in full sentences. Resp status improving closer to baseline.

## 2022-10-05 NOTE — Progress Notes (Signed)
Patient ID: Gloria Gallagher, female   DOB: 05/31/1950, 72 y.o.   MRN: [id number] [facility] Progress Note   Assessment/ Plan:   1.  Acute hypoxic respiratory failure: Appears to be from multiple factors including pulmonary edema but largely from pericardial effusion/tamponade with significant improvement seen status post pericardiocentesis.  Volume status being optimized with HD/UF- she had BP drop with HD 2 days ago limiting UF. 2.  End-stage renal disease: Continue hemodialysis Monday/Wednesday/Friday schedule with ongoing HD at this time 3.  Anemia: Hemoglobin and hematocrit borderline low but acceptable, continue to monitor trend to decide on need to restart ESA. 4.  Secondary hyperparathyroidism: Continue renal diet and will follow phosphorus trend to decide on need to resume binders. Resume hectorol for PTH control.  5.  Atrial fibrillation: On apixiban after transiently held for pericardial drain placement.  She remains rate controlled and currently hemodynamic stable after earlier suffering obstructive shock secondary to tamponade.  Subjective:   Ambulated yesterday with assistance, remains dependent on supplemental oxygen with exertional dyspnea. No drainage for pericardial catheter removal site.     Objective:   BP (!) 91/59   Pulse (!) 56   Temp (!) 97.4 F (36.3 C)   Resp (!) 25   Ht 5\' 5"  (1.651 m)   Wt 66.4 kg   SpO2 98%   BMI 24.36 kg/m   Physical Exam: Gen: Resting comfortably in dialysis, on oxygen via [place] CVS: Pulse regular rhythm, normal rate, S1 and S2 normal. Resp: Anteriorly clear to auscultation bilaterally, no distinct rales or rhonchi.  Appears dyspneic with conversation.  Abd: Soft, flat, nontender, bowel sounds normal. Ext: No lower extremity edema, aneurysmal left upper arm AV fistula  Labs: BMET Recent Labs  Lab 10/01/22 1333 10/01/22 1338 10/02/22 0302 10/02/22 0315 10/03/22 0305 10/03/22 0957 10/04/22 0043 10/05/22 0340  NA  137 134*  134* 134* 132* 133*  --  136 134*  K 3.7 3.6  3.6 3.8 3.8 4.6  --  3.3* 3.8  CL 90* 91* 92*  --  91*  --  96* 94*  CO2 30  --  23  --  23  --  26 26  GLUCOSE 121* 123* 180*  --  134*  --  132* 107*  BUN 19 25* 26*  --  47*  --  25* 49*  CREATININE 4.62* 4.70* 4.81*  --  7.41*  --  4.06* 7.19*  CALCIUM 8.4*  --  8.6*  --  8.5*  --  8.9 8.5*  PHOS  --   --  4.1  --   --  6.3* 2.7 3.6   CBC Recent Labs  Lab 10/01/22 1333 10/01/22 1338 10/02/22 0302 10/02/22 0315 10/03/22 0305 10/04/22 0043 10/05/22 0340  WBC 8.7  --  9.3  --  11.2* 7.6 5.6  NEUTROABS 6.6  --   --   --   --   --   --   HGB 11.7*   < > 10.5* 11.6* 10.1* 9.9* 10.1*  HCT 37.4   < > 33.1* 34.0* 31.7* 30.0* 30.8*  MCV 96.9  --  95.9  --  94.3 91.7 93.6  PLT 223  --  236  --  298 263 220   < > = values in this interval not displayed.      Medications:     amiodarone  200 mg Oral Daily   apixaban  5 mg Oral BID   atorvastatin  20 mg Oral Daily  Chlorhexidine Gluconate Cloth  6 each Topical Q0600   docusate sodium  100 mg Oral BID   ferric citrate  210 mg Oral TID WC   multivitamin  1 tablet Oral QHS   polyethylene glycol  17 g Oral BID   senna  2 tablet Oral QHS   sodium chloride flush  3 mL Intravenous Q12H   Zetta Bills, MD 10/05/2022, 9:52 AM

## 2022-10-05 NOTE — Progress Notes (Signed)
   10/05/22 1128  Vitals  Temp (!) 97 F (36.1 C)  Pulse Rate (!) 52  Resp 17  BP (!) 96/48  SpO2 (!) 66 %  O2 Device Nasal Cannula  Weight 65 kg  Type of Weight Post-Dialysis  Oxygen Therapy  O2 Flow Rate (L/min) 3 L/min  Patient Activity (if Appropriate) In bed  Pulse Oximetry Type Continuous  Oximetry Probe Site Changed No  Post Treatment  Dialyzer Clearance Lightly streaked  Duration of HD Treatment -hour(s) 3.5 hour(s)  Hemodialysis Intake (mL) 0 mL  Liters Processed 84  Fluid Removed (mL) 1400 mL  AVG/AVF Arterial Site Held (minutes) 10 minutes  AVG/AVF Venous Site Held (minutes) 10 minutes   Received patient in bed to unit.  Alert and oriented.  Informed consent signed and in chart.   TX duration:3.5  Patient tolerated well.  Transported back to the room  Alert, without acute distress.  Hand-off given to patient's nurse.   Access used: LUAF Access issues:no com[plications  Total UF removed: 1400 Medication(s) given: none   [facility]

## 2022-10-05 NOTE — Progress Notes (Signed)
Upon entry to room, pt has already gone to hemodialysis.

## 2022-10-05 NOTE — Assessment & Plan Note (Addendum)
S/p pericardiocentesis and respiratory status gradually improving. Currently on 3L (baseline RA). Lung exam clear and good airmovement. Workup thus far most c/f malignancy (lung nodule, liver lesions, kidney lesions, elevated CA 125 and 19-9). Less likely autoimmune or infectious. With regard to malignancy, liver is top of differential given calcified appearance of liver lesions. Denies excessive alcohol use and prior HepatitisB and C workup negative. - Obtain quant-gold to workup potential TB - f/u TSH - Will need outpt workup including:  - Colonoscopy  - CT, PET CT, or lung biopsy in 3 months

## 2022-10-05 NOTE — Progress Notes (Addendum)
2150: Patient received to unit via wheelchair, alert and oriented x4. Bed alarm activated and call bell placed within reach of patient .   0123 :HD nurse-Fabian requested to have patient dialyzed at 0200 am. I informed patient and she refused to have HD at 0200 am. She states " it too early in the morning, I would like to have dialysis later in the day."

## 2022-10-05 NOTE — Procedures (Signed)
Patient seen on Hemodialysis. BP (!) 91/59   Pulse (!) 56   Temp (!) 97.4 F (36.3 C)   Resp (!) 25   Ht 5\' 5"  (1.651 m)   Wt 66.4 kg   SpO2 98%   BMI 24.36 kg/m   QB 400, UF goal 1.5L Tolerating treatment without complaints at this time.   Zetta Bills MD [facility]. Office # [phone] Pager # [phone] 10:01 AM

## 2022-10-05 NOTE — Assessment & Plan Note (Signed)
-   f/u RUQ [place]

## 2022-10-06 ENCOUNTER — Inpatient Hospital Stay (HOSPITAL_COMMUNITY): Payer: 59

## 2022-10-06 DIAGNOSIS — I3139 Other pericardial effusion (noninflammatory): Secondary | ICD-10-CM | POA: Diagnosis not present

## 2022-10-06 LAB — RENAL FUNCTION PANEL
Albumin: 2.8 g/dL — ABNORMAL LOW (ref 3.5–5.0)
Anion gap: 12 (ref 5–15)
BUN: 34 mg/dL — ABNORMAL HIGH (ref 8–23)
CO2: 28 mmol/L (ref 22–32)
Calcium: 9.2 mg/dL (ref 8.9–10.3)
Chloride: 93 mmol/L — ABNORMAL LOW (ref 98–111)
Creatinine, Ser: 5.52 mg/dL — ABNORMAL HIGH (ref 0.44–1.00)
GFR, Estimated: 8 mL/min — ABNORMAL LOW (ref >60)
Glucose, Bld: 101 mg/dL — ABNORMAL HIGH (ref 70–99)
Phosphorus: 2.7 mg/dL (ref 2.5–4.6)
Potassium: 3.9 mmol/L (ref 3.5–5.1)
Sodium: 133 mmol/L — ABNORMAL LOW (ref 135–145)

## 2022-10-06 LAB — CBC
HCT: 31.3 % — ABNORMAL LOW (ref 36.0–46.0)
Hemoglobin: 10.1 g/dL — ABNORMAL LOW (ref 12.0–15.0)
MCH: 29.6 pg (ref 26.0–34.0)
MCHC: 32.3 g/dL (ref 30.0–36.0)
MCV: 91.8 fL (ref 80.0–100.0)
Platelets: 228 10*3/uL (ref 150–400)
RBC: 3.41 MIL/uL — ABNORMAL LOW (ref 3.87–5.11)
RDW: 14.7 % (ref 11.5–15.5)
WBC: 5.9 10*3/uL (ref 4.0–10.5)
nRBC: 0 % (ref 0.0–0.2)

## 2022-10-06 LAB — ANTINUCLEAR ANTIBODIES, IFA: ANA Ab, IFA: NEGATIVE

## 2022-10-06 LAB — TSH: TSH: 1.909 u[IU]/mL (ref 0.350–4.500)

## 2022-10-06 NOTE — Plan of Care (Signed)
[facility] Patient Discharge Orders- [facility] CLINIC: [place]  Patient's name: Gloria Gallagher Admit/DC Dates: 10/01/2022 - 10/06/22  Discharge Diagnoses: Acute hypoxic respiratory failure secondary to #2  Pericardial effusion/cardiac tamponade/shock -s/p pericardiocentesis   Aranesp: Given: No   Date and amount of last dose: --  Last Hgb: 10.1 PRBC's Given: No Date/# of units: -- ESA dose for discharge: Follow protocol  IV Iron dose at discharge: --  Heparin change: No change  EDW Change: Yes New EDW: 65.5 kg   [place] Change: No   Access intervention/Change: No change Details:  Hectorol/Calcitriol change: No change  Discharge Labs: Calcium 9.2 Phosphorus 2.7 Albumin 2.8 K+ 3.9  IV Antibiotics: N/A Details:  On Coumadin?: No  Last INR: Next INR: Managed By:   OTHER/APPTS/LAB ORDERS: Needs continued outpatient evaluation for malignancy work up   D/C Meds to be reconciled by nurse after every discharge.  Completed By: Tomasa Blase PA-C [facility] 10/06/2022,2:04 PM   Reviewed by: MD:______ RN_______

## 2022-10-06 NOTE — Progress Notes (Signed)
I evaluated the patient this morning. Much improved and feeling better.  No acute findings on her physical exam. Likely home today.  NB: I called her room number 5M14 to discuss f/u plan with cardiology and her PCP. However, she did not pick up initially. I then called her listed cellphone number - [phone] which was answered. I confirmed it was the patient I was speaking with. I asked about her PCP and cardiologist and if she is able to make a follow-up appointment with them next Tuesday, and this individual said yes. No other health information was disclosed. At the end of the conversation, the individual I spoke with said she was not Khyia but her sister. However, she stated that she handles all of Kripa's health information. I then called Rhayne's room, which she picked up this time. She affirms that it is okay to discuss her health information with her sister, who cares for her. She also confirms that they plan to schedule PCP and cardiology appointments next Tuesday.

## 2022-10-06 NOTE — Progress Notes (Signed)
Patient ID: Gloria Gallagher, female   DOB: 03-20-1951, 71 y.o.   MRN: [id number] [facility] Progress Note   Assessment/ Plan:   1.  Acute hypoxic respiratory failure: Appears to be from multiple factors including pulmonary edema but largely from pericardial effusion/tamponade with significant improvement seen status post pericardiocentesis.  Volume status being optimized with HD/UF- she had BP drop with HD 2 days ago limiting UF. 2.  End-stage renal disease: Continue hemodialysis Monday/Wednesday/Friday schedule with ongoing HD at this time. Post HD wt on 5/24 65kg --will lower outpatient dry weight (currently 68 kg) 3.  Anemia: Hemoglobin and hematocrit borderline low but acceptable, continue to monitor trend to decide on need to restart ESA. Hb 10.1  4.  Secondary hyperparathyroidism: Continue renal diet and will follow phosphorus trend to decide on need to resume binders. Resume hectorol for PTH control.  5.  Atrial fibrillation: On apixiban after transiently held for pericardial drain placement.  She remains rate controlled and currently hemodynamic stable after earlier suffering obstructive shock secondary to tamponade.  Subjective:   Seen in room. Completed dialysis yesterday with 1.5L removed. On room air this am. Breathing much improved. She's hungry and wants to go home today.    Objective:   BP (!) 96/57 (BP Location: Right Arm)   Pulse 68   Temp 98.4 F (36.9 C)   Resp 15   Ht 5\' 5"  (1.651 m)   Wt 65 kg   SpO2 94%   BMI 23.85 kg/m   Physical Exam: Gen: Resting comfortably in dialysis, on room air  CVS: Pulse regular rhythm, normal rate, S1 and S2 normal. Resp: Anteriorly clear to auscultation bilaterally, no distinct rales or rhonchi.  Appears dyspneic with conversation.  Abd: Soft, flat, nontender, bowel sounds normal. Ext: No lower extremity edema, aneurysmal left upper arm AV fistula  Labs: BMET Recent Labs  Lab 10/01/22 1333 10/01/22 1338  10/02/22 0302 10/02/22 0315 10/03/22 0305 10/03/22 0957 10/04/22 0043 10/05/22 0340 10/06/22 0308  NA 137 134*  134* 134* 132* 133*  --  136 134* 133*  K 3.7 3.6  3.6 3.8 3.8 4.6  --  3.3* 3.8 3.9  CL 90* 91* 92*  --  91*  --  96* 94* 93*  CO2 30  --  23  --  23  --  26 26 28   GLUCOSE 121* 123* 180*  --  134*  --  132* 107* 101*  BUN 19 25* 26*  --  47*  --  25* 49* 34*  CREATININE 4.62* 4.70* 4.81*  --  7.41*  --  4.06* 7.19* 5.52*  CALCIUM 8.4*  --  8.6*  --  8.5*  --  8.9 8.5* 9.2  PHOS  --   --  4.1  --   --  6.3* 2.7 3.6 2.7    CBC Recent Labs  Lab 10/01/22 1333 10/01/22 1338 10/03/22 0305 10/04/22 0043 10/05/22 0340 10/06/22 0308  WBC 8.7   < > 11.2* 7.6 5.6 5.9  NEUTROABS 6.6  --   --   --   --   --   HGB 11.7*   < > 10.1* 9.9* 10.1* 10.1*  HCT 37.4   < > 31.7* 30.0* 30.8* 31.3*  MCV 96.9   < > 94.3 91.7 93.6 91.8  PLT 223   < > 298 263 220 228   < > = values in this interval not displayed.       Medications:  amiodarone  200 mg Oral Daily   apixaban  5 mg Oral BID   atorvastatin  20 mg Oral Daily   Chlorhexidine Gluconate Cloth  6 each Topical Q0600   docusate sodium  100 mg Oral BID   doxercalciferol  5 mcg Intravenous Q M,W,F-HD   ferric citrate  210 mg Oral TID WC   multivitamin  1 tablet Oral QHS   polyethylene glycol  17 g Oral BID   senna  2 tablet Oral QHS   sodium chloride flush  3 mL Intravenous Q12H   Tomasa Blase PA-C [facility] 10/06/2022,9:18 AM

## 2022-10-06 NOTE — Discharge Instructions (Signed)
Thank you for letting us be a part of your care! You were admitted to the hospital for a cardiac effusion (fluid around the heart). You had to go to the ICU (intensive care unit) and the Cardiologist performed a pericardiocentesis (draining the fluid around your heart), and you got better. You will need to see your Cardiologist and PCP within a week of discharging from the hospital.  - Make sure you see your Cardiologist. They can repeat an echocardiogram to see if more fluid is around your heart.  - Make sure you see your Primary Care Physician. They can perform tests to check for the reason why you started to have fluid around your heart  - You will need a colonoscopy (to look for colon cancer) and another CT scan of your chest in 3 months (to look for lung cancer).  - You are still needing some oxygen, especially with activity and moving around. We will send you home with oxygen to use.  - You can use 3liter on your oxygen machine or tank when you are active. It is possible that you can stop using oxygen as your heart improves. If you feel comfortable without oxygen, but put it back on if you feel short of breath or working hard to breathe.

## 2022-10-06 NOTE — Plan of Care (Signed)
°  Problem: Clinical Measurements: °Goal: Ability to maintain clinical measurements within normal limits will improve °Outcome: Progressing °Goal: Will remain free from infection °Outcome: Progressing °Goal: Diagnostic test results will improve °Outcome: Progressing °Goal: Respiratory complications will improve °Outcome: Progressing °Goal: Cardiovascular complication will be avoided °Outcome: Progressing °  °Problem: Activity: °Goal: Risk for activity intolerance will decrease °Outcome: Progressing °  °Problem: Nutrition: °Goal: Adequate nutrition will be maintained °Outcome: Progressing °  °Problem: Coping: °Goal: Level of anxiety will decrease °Outcome: Progressing °  °Problem: Elimination: °Goal: Will not experience complications related to bowel motility °Outcome: Progressing °Goal: Will not experience complications related to urinary retention °Outcome: Progressing °  °Problem: Pain Managment: °Goal: General experience of comfort will improve °Outcome: Progressing °  °Problem: Safety: °Goal: Ability to remain free from injury will improve °Outcome: Progressing °  °

## 2022-10-06 NOTE — Discharge Summary (Signed)
Family Medicine Teaching [facility] Discharge Summary  Patient name: Gloria Gallagher Medical record number: [id number] Date of birth: August 08, 1950 Age: 72 y.o. Gender: female Date of Admission: 10/01/2022  Date of Discharge: 10/06/22 Admitting Physician: Charlott Holler, MD  Primary Care Provider: Hillery Aldo, NP Consultants: Cardiology, Critical Care, Nephrology  Indication for Hospitalization: Cardiac Tamponade, Acute Hypoxic Respiratory Failure  Brief Hospital Course:  Gloria Gallagher is a 72 y.o.female with a history of ESRD on HD, atrial fibrillation, HTN, GERD, gout, hyperparathyroidism, CAD who was admitted to the [facility] for acute hypoxic respiratory failure. Her hospital course is detailed below:  Acute Hypoxic Respiratory Failure  Cardiac Tamponade leading to Obstructive Shock Presented with shortness of breath despite full HD treatment. Initially requried BiPAP in ED (baseline on RA). CTA from 6 days prior showed pulmonary hypertension. In ED, CXR with no acute pathology, CTA negative for PE but again showed evidence of pulmonary hypertension was also concerning for large pericardial effusion.  EKG negative for MI but showed A-fib.  Labs showed elevated BNP, flat troponin, hyponatremia, hypoalbuminemia, mild anemia.   Patient was admitted to ICU with urgent dialysis, continued on BiPAP, required IV amiodarone for A-fib, and was started on pressors due to MAP in 50s. Pt underwent 5/21 pericardiocentesis and drain placement w/ Cardiology, drew off ~1L of serosanguinous fluid. Workup for cardiac effusion discussed below. Pt respiratory status improved, weaned to nasal cannula, and cardiac drain was removed.   Pt was transferred from ICU to FMTS on 10/05/22. Ambulatory walk test showed pt needed 3L [place] w/ ambulation, but pt was able to be on RA at rest. Home oxygen ordered and instructed to use 3L O2 as needed.   Cardiac Effusion Workup  Cystic  Hepatic Lesions  Polycystic Kidneys  Lung Nodule Workup for cardiac effusion notable for elevated CA 125 and CA 19-9, which is most c/f malignant etiology. Autoimmune and infectious workup unremarkable. TB is also considered, Quant-gold pending at time of discharge. Potential malignancy sources noted on CTA include 1cm R lower lobe lung nodule, calcified hepatic lesion and multiple hepatic cysts, multiple renal cysts, and colon cancer. Pt will need outpatient colonoscopy, repeat lung imaging in 3 months, and further workup of hepatic lesions and renal lesions.   ESRD on HD Nephrology followed through hospitalization. Required urgent HD on day of admission. Thereafter, followed regular MWF schedule.  Atrial fibrillation Initially on IV amiodarone in ICU, then resumed home PO amiodarone and eliquis once stabilized. Stable and rate controlled at time of discharge.   Other chronic conditions were medically managed with home medications and formulary alternatives as necessary.  PCP Follow-up Recommendations:  Repeat echo in 1 weeks to monitor pericardial effusion and Cardiology follow up Needs colon cancer screening (colonoscopy) given possible malignant cause of pericarditis Recommended monitoring pulmonary nodule w/ CT, PET CT, or lung biopsy in 3 months  Recommend workup of calcified liver lesion. Consider hepatology referral.  Follow-up Quant-gold result. Pending at time of discharge.    Discharge Diagnoses/Problem List:  Pericardial effusion, cardiac tamponade Acute hypoxic respiratory failure ESRD on dialysis Cystic hepatic lesions Cystic renal lesions  Disposition: Home w/ HH  Discharge Condition: improved and stable  Discharge Exam:  Gen: Alert, older woman resting comfortably in bed.  NAD. HEENT: NCAT.  MMM. Respiratory: CTAB.  Normal work of breathing on room air while at rest. CV: Regular rate, irregular rhythm. No murmurs Abm: Soft, nontender, nondistended. Normal BS.   Skin: Warm,  well perfused  Significant Procedures:  Pericardiocentesis 5/21  Significant Labs and Imaging:  Recent Labs  Lab 10/05/22 0340 10/06/22 0308  WBC 5.6 5.9  HGB 10.1* 10.1*  HCT 30.8* 31.3*  PLT 220 228   Recent Labs  Lab 10/05/22 0340 10/06/22 0308  NA 134* 133*  K 3.8 3.9  CL 94* 93*  CO2 26 28  GLUCOSE 107* 101*  BUN 49* 34*  CREATININE 7.19* 5.52*  CALCIUM 8.5* 9.2  PHOS 3.6 2.7  ALBUMIN 2.8* 2.8*   Results/Tests Pending at Time of Discharge:  - Quant-gold for TB  Discharge Medications:  Allergies as of 10/06/2022       Reactions   Penicillin G Hives, Itching   Shrimp (diagnostic) Hives        Medication List     STOP taking these medications    oxyCODONE-acetaminophen 5-325 MG tablet Commonly known as: PERCOCET/ROXICET   Urea Nail 45 % Gel Generic drug: Urea       TAKE these medications    acetaminophen 500 MG tablet Commonly known as: TYLENOL Take 2 tablets (1,000 mg total) by mouth every 6 (six) hours as needed for mild pain or moderate pain.   amiodarone 200 MG tablet Commonly known as: PACERONE Take 1 tablet (200 mg total) by mouth daily.   atorvastatin 20 MG tablet Commonly known as: LIPITOR Take 20 mg by mouth daily.   diclofenac Sodium 1 % Gel Commonly known as: VOLTAREN Apply 4 g topically 4 (four) times daily. What changed:  when to take this reasons to take this   Eliquis 5 MG Tabs tablet Generic drug: apixaban TAKE 1 TABLET BY MOUTH TWICE A DAY   ferric citrate 1 GM 210 MG(Fe) tablet Commonly known as: AURYXIA Take 1 tablet (210 mg total) by mouth 3 (three) times daily with meals.   HECTOROL IV Inject 1 Dose into the vein every Monday, Wednesday, and Friday with hemodialysis. During dialysis, 3X week   multivitamin Tabs tablet Take 1 tablet by mouth at bedtime.   Sensipar 30 MG tablet Generic drug: cinacalcet Take 30 mg by mouth daily.               Durable Medical Equipment  (From  admission, onward)           Start     Ordered   10/06/22 0742  For home use only DME oxygen  Once       Question Answer Comment  Length of Need Lifetime   Frequency Continuous (stationary and portable oxygen unit needed)   Oxygen delivery system Gas      10/06/22 0742            Discharge Instructions: Please refer to Patient Instructions section of EMR for full details.  Patient was counseled important signs and symptoms that should prompt return to medical care, changes in medications, dietary instructions, activity restrictions, and follow up appointments.   Follow-Up Appointments:  Follow-up Information     Swinyer, Zachary George, NP Follow up on 10/15/2022.   Specialty: Cardiology Why: at 10:55am for your follow up appt with cardiology Contact information: [address] [place] [address] [phone]         [facility] Follow up.   Specialty: Home Health Services Why: Someone will call you to schedule first home visit. If you have not received a call after two days of discharging home, call their number listed. If no one comes to assess, call Case  Manager at [phone]. Contact information: [address] [place] [address] [phone]         [employer] Follow up.   Why: Someone from [employer] will deliver oxygen to your room. If you have any questions please call them at number listed above. Contact information: [address] [phone]                Lincoln Brigham, MD 10/06/2022, 1:00 PM PGY-1, [facility] Family Medicine

## 2022-10-06 NOTE — TOC Progression Note (Signed)
Transition of Care [facility]) - Progression Note    Patient Details  Name: Gloria Gallagher MRN: [id number] Date of Birth: 1950-06-02  Transition of Care [facility] CM/SW Contact  Huston Foley Jacklynn Ganong, RN Phone Number: 10/06/2022, 12:50 PM  Clinical Narrative:     Case manager contacted [facility] Liaison and confirmed that patient is setup with them for HHPT/OT. CM has arranged for oxygen for patient, to be delivered to room.       Barriers to Discharge: No Barriers Identified  Expected Discharge Plan and Services   Discharge Planning Services: CM Consult Post Acute Care Choice: Home Health Living arrangements for the past 2 months: Single Family Home                 DME Arranged: Oxygen DME Agency: [employer] Date DME Agency Contacted: 10/06/22 Time DME Agency Contacted: 1100 Representative spoke with at DME Agency: Lelon Mast HH Arranged: PT, OT HH Agency: [facility] Date [facility] Agency Contacted: 10/06/22 Time HH Agency Contacted: [phone] Representative spoke with at [facility] Agency: Confirmed HH with [facility] Liaison   Social Determinants of Health (SDOH) Interventions SDOH Screenings   Food Insecurity: No Food Insecurity (01/18/2022)  Housing: Low Risk  (01/18/2022)  Transportation Needs: No Transportation Needs (01/18/2022)  Utilities: Not At Risk (01/18/2022)  Tobacco Use: Medium Risk (10/02/2022)    Readmission Risk Interventions     No data to display

## 2022-10-07 ENCOUNTER — Telehealth: Payer: Self-pay | Admitting: Nephrology

## 2022-10-07 LAB — CULTURE, BODY FLUID W GRAM STAIN -BOTTLE: Culture: NO GROWTH

## 2022-10-07 NOTE — Telephone Encounter (Signed)
Transition of care contact from inpatient facility  Date of Discharge: 10/06/22 Date of Contact: 10/07/22 Method of contact: Phone  Attempted to contact patient to discuss transition of care from inpatient admission. Patient did not answer the phone. Message was left on the patient's voicemail. Will try to reach at outpatient dialysis.

## 2022-10-10 LAB — QUANTIFERON-TB GOLD PLUS (RQFGPL)
QuantiFERON Mitogen Value: 0.69 IU/mL
QuantiFERON Nil Value: 0 IU/mL
QuantiFERON TB1 Ag Value: 0 IU/mL
QuantiFERON TB2 Ag Value: 0 IU/mL

## 2022-10-10 LAB — QUANTIFERON-TB GOLD PLUS: QuantiFERON-TB Gold Plus: NEGATIVE

## 2022-10-15 ENCOUNTER — Ambulatory Visit: Payer: 59 | Admitting: Nurse Practitioner

## 2022-10-15 NOTE — Progress Notes (Signed)
Cardiology Office Note:    Date:  10/16/2022   ID:  Gloria Gallagher, DOB 29-May-1950, MRN [id number]  PCP:  Hillery Aldo, NP   [facility] Providers Cardiologist:  Kristeen Miss, MD     Referring MD: Hillery Aldo, NP   Chief Complaint: Hospital follow-up pericardial effusion  History of Present Illness:    Gloria Gallagher is a pleasant 72 y.o. female with a hx of ESRD on HD M/W/F, carotid artery disease, persistent atrial fibrillation, aortic sclerosis, HTN, and pericardial effusion.   She has been on hemodialysis > 10 years. Follows with Dr. 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  New heart murmur was noted with concern for referral as well as possible bruit in carotid artery.  Sent to ED for CTA of neck which was unremarkable for dissection.  She was diagnosed with acute otitis media, unaware of her arrhythmia.  Started on Eliquis for CHA2DS2-VASc score of 3.  She underwent cardioversions 05/16/21, 08/01/21, and 12/05/21 with return of A-fib.  Followed by [facility] an maintained on amiodarone.  Cardiac MR 08/2021 revealed trileaflet aortic valve with dilated sinus and root, mild to moderate AR 2 concomitant MR, severely fusiform dilatation of the ascending thoracic aorta 4.6 cm, severe aneurysm dilatation of the main pulmonary artery 5.4 cm, mild LVE with LVEF 64%, moderate biatrial enlargement, mild TR, concerned that patient may have connective tissue disease given severe aneurysmal dilatation of the ascending thoracic aorta and main pulmonary artery.  Suspicious liver mass seen on MR, referred to GI.  Last cardiology clinic visit was 03/20/22 with Dr. Elease Hashimoto.  She has a wide pulse pressure because of her aortic insufficiency which is moderate to severe.  She is felt to be a poor candidate for AVR per Dr.  Elease Hashimoto.  Admission 5/20-5/25/24 with shortness of breath after HD. Recent ED visits for similar symptoms felt to be in acute pulmonary edema, intubated and admitted to ICU.  Required pressor support.  Emergent pericardiocentesis for tamponade performed by Dr. Clifton James  on 10/02/22 with removal of approximately 1 L of fluid.  Follow-up limited echocardiogram revealed small residual effusion and resolution of RV and RA diastolic collapse and reduction in heart rate.  Small residual pericardial effusion anterior lateral to the left ventricle.  Fibrinous strands seen in that area and the effusion may be partially loculated with the majority successfully drained.  Workup notable for elevated CA125 and CA 19-19 most consistent with malignant etiology.  Immune and infectious workup unremarkable.  Commendation to undergo outpatient colonoscopy lung imaging, and workup of hepatic and renal lesions.  Today, she is here with her family.  Reports she is feeling better since hospital discharge. Prior to admission, she was feeling more short of breath and had a cramping in her chest. Went to ED on one occasion and was told it was MSK chest pain. On 10/01/22, says she was unable to get her breath after dialysis and went back to ED as outlined above.  Since returning home, she has had no significant shortness of breath, chest pain, PND, edema, orthopnea. Has resumed dialysis with no concerns. Does not have any discharge instructions. Saw PCP a few days ago. No one has reported that the pericardial fluid was concerning for malignancy.   Past Medical History:  Diagnosis Date  Anemia    Aortic atherosclerosis (HCC)    Arthritis    Atrial fibrillation (HCC)    CAD (coronary artery disease)    COVID 2021   mild   Dialysis patient (HCC)    ESRD on hemodialysis (HCC)    MWF at [facility]   GERD (gastroesophageal reflux disease)    Gout    Heart murmur    Hyperparathyroidism (HCC)    Hypertension    in the past, no  longer takes medications   Neuromuscular disorder (HCC)    neuropathy   Pneumonia    Seasonal allergies     Past Surgical History:  Procedure Laterality Date   CARDIOVERSION N/A 05/16/2021   Procedure: CARDIOVERSION;  Surgeon: Elease Hashimoto Deloris Ping, MD;  Location: [facility] ENDOSCOPY;  Service: Cardiovascular;  Laterality: N/A;   CARDIOVERSION N/A 08/01/2021   Procedure: CARDIOVERSION;  Surgeon: Thurmon Fair, MD;  Location: [facility] ENDOSCOPY;  Service: Cardiovascular;  Laterality: N/A;   CARDIOVERSION N/A 12/05/2021   Procedure: CARDIOVERSION;  Surgeon: Thomasene Ripple, DO;  Location: [facility] ENDOSCOPY;  Service: Cardiovascular;  Laterality: N/A;   DIALYSIS FISTULA CREATION Left    PERICARDIOCENTESIS N/A 10/02/2022   Procedure: PERICARDIOCENTESIS;  Surgeon: Kathleene Hazel, MD;  Location: [facility] INVASIVE CV LAB;  Service: Cardiovascular;  Laterality: N/A;   REVISON OF ARTERIOVENOUS FISTULA Left 02/16/2022   Procedure: LEFT ARM ARTERIOVENOUS FISTULA REVISION;  Surgeon: Leonie Douglas, MD;  Location: [facility] OR;  Service: Vascular;  Laterality: Left;  PERIPHERAL NERVE BLOCK   TUBAL LIGATION      Current Medications: Current Meds  Medication Sig   acetaminophen (TYLENOL) 500 MG tablet Take 2 tablets (1,000 mg total) by mouth every 6 (six) hours as needed for mild pain or moderate pain.   amiodarone (PACERONE) 200 MG tablet Take 1 tablet (200 mg total) by mouth daily.   apixaban (ELIQUIS) 5 MG TABS tablet TAKE 1 TABLET BY MOUTH TWICE A DAY   atorvastatin (LIPITOR) 20 MG tablet Take 20 mg by mouth daily.   diclofenac Sodium (VOLTAREN) 1 % GEL Apply 4 g topically 4 (four) times daily. (Patient taking differently: Apply 4 g topically daily as needed (For pain).)   Doxercalciferol (HECTOROL IV) Inject 1 Dose into the vein every Monday, Wednesday, and Friday with hemodialysis. During dialysis, 3X week   ferric citrate (AURYXIA) 1 GM 210 MG(Fe) tablet Take 1 tablet (210 mg total) by mouth 3 (three) times daily with  meals.   multivitamin (RENA-VIT) TABS tablet Take 1 tablet by mouth at bedtime.   SENSIPAR 30 MG tablet Take 30 mg by mouth daily.     Allergies:   Penicillin g and Shrimp (diagnostic)   Social History   Socioeconomic History   Marital status: Single    Spouse name: Not on file   Number of children: 1   Years of education: Not on file   Highest education level: Not on file  Occupational History   Occupation: retired  Tobacco Use   Smoking status: Former    Types: Cigarettes    Passive exposure: Never   Smokeless tobacco: Never   Tobacco comments:    Former smoker 04/25/2021  Vaping Use   Vaping Use: Never used  Substance and Sexual Activity   Alcohol use: Yes    Alcohol/week: 1.0 standard drink of alcohol    Types: 1 Cans of beer per week    Comment: 1 beer every other week 06/20/21   Drug use: Never   Sexual activity: Not Currently  Other Topics Concern   Not on file  Social History Narrative   Not on file   Social Determinants of Health   Financial Resource Strain: Not on file  Food Insecurity: No Food Insecurity (01/18/2022)   Hunger Vital Sign    Worried About Running Out of Food in the Last Year: Never true    Ran Out of Food in the Last Year: Never true  Transportation Needs: No Transportation Needs (01/18/2022)   PRAPARE - Administrator, Civil Service (Medical): No    Lack of Transportation (Non-Medical): No  Physical Activity: Not on file  Stress: Not on file  Social Connections: Not on file     Family History: The patient's family history includes Diabetes in her sister. There is no history of Stomach cancer or Colon cancer.  ROS:   Please see the history of present illness.   All other systems reviewed and are negative.  Labs/Other Studies Reviewed:    The following studies were reviewed today:  Limited Echo 10/02/22 Conclusion(s)/Recommendation(s): Periprocedural echo for  pericardiocentesis. The postprocedural images show a small  residual  effusion and resolution of RV and RA diastolic collapse and reduction in  heart rate. There is a small residual pericardial  effusion anterolateral to the left ventricle. Fibrinous strands are seen  in that area and the effusion may be partially loculated. The vast  majority of the effusion is successfully drained.  Echo 10/02/22 1. Left ventricular ejection fraction, by estimation, is 60 to 65%. The  left ventricle has normal function. The left ventricle has no regional  wall motion abnormalities. There is mild concentric left ventricular  hypertrophy. Indeterminate diastolic  filling due to E-A fusion.   2. Right ventricular systolic function is moderately reduced. The right  ventricular size is mildly enlarged. Mildly increased right ventricular  wall thickness. There is normal pulmonary artery systolic pressure. The  estimated right ventricular systolic  pressure is 35.6 mmHg.   3. Left atrial size was moderately dilated.   4. Right atrial size was moderately dilated.   5. There is marked cystic change of the left lobe of the liver and there  is evidence of severe polycystic changes of the neighboring kidney tissue.  Large pericardial effusion. Findings are consistent with cardiac  tamponade.   6. The mitral valve is normal in structure. Trivial mitral valve  regurgitation. No evidence of mitral stenosis.   7. The aortic valve is tricuspid. There is mild calcification of the  aortic valve. Aortic valve regurgitation is moderate. Aortic valve  sclerosis is present, with no evidence of aortic valve stenosis.   8. Pulmonic valve regurgitation is moderate.   9. Aortic dilatation noted. There is mild dilatation of the aortic root,  measuring 41 mm. There is moderate dilatation of the ascending aorta,  measuring 47 mm.  10. The inferior vena cava is dilated in size with <50% respiratory  variability, suggesting right atrial pressure of 15 mmHg.   Comparison(s): Prior images  reviewed side by side. Changes from prior  study are noted. There is a new large pericardial effusion with echo  evidence of tamponade. Otherwise no change from the 07/04/2021 study.   Cardiac MR 08/22/21 IMPRESSION: 1 Tri- leaflet AV with dilated sinus and root leading to central AR. By quantification and vena contracta only mild to moderate AR RF/RV may be spurious due To concomitant mitral regurgitation   2. Severely fusiform dilatation of the ascending thoracic aorta 4.6 cm   3.  Severe aneurysm dilatation of the main pulmonary artery 5.4 cm   4.  Mild LVE with LVEF 64%   5.  Normal RV size and function RVEF 56%   6.  Moderate bi atrial enlargement   7.  No delayed hyper enhancement on gadolinium images   8.  Mild appearing MR   9.  Moderate PR   10.  Mild TR   Concern that patient may have connective tissue disease given severe aneurysmal dilatation of the ascending thoracic aorta and main pulmonary artery Suggest   Possible genetic testing for Loeys-Dietz syndrome or other  Recent Labs: 10/01/2022: ALT 15 10/02/2022: B Natriuretic Peptide 546.5 10/03/2022: Magnesium 2.1 10/06/2022: BUN 34; Creatinine, Ser 5.52; Hemoglobin 10.1; Platelets 228; Potassium 3.9; Sodium 133; TSH 1.909  Recent Lipid Panel No results found for: "CHOL", "TRIG", "HDL", "CHOLHDL", "VLDL", "LDLCALC", "LDLDIRECT"   Risk Assessment/Calculations:    CHA2DS2-VASc Score = 4   This indicates a 4.8% annual risk of stroke. The patient's score is based upon: CHF History: 0 HTN History: 1 Diabetes History: 0 Stroke History: 0 Vascular Disease History: 1 Age Score: 1 Gender Score: 1          Physical Exam:    VS:  BP 112/60   Pulse 82   Ht 5\' 5"  (1.651 m)   Wt 146 lb 6.4 oz (66.4 kg)   BMI 24.36 kg/m     Wt Readings from Last 3 Encounters:  10/16/22 146 lb 6.4 oz (66.4 kg)  10/05/22 143 lb 4.8 oz (65 kg)  09/25/22 156 lb 8.4 oz (71 kg)     GEN:  Well nourished, well developed in  no acute distress HEENT: Normal NECK: No JVD; No carotid bruits CARDIAC: RRR, no murmurs, rubs, gallops RESPIRATORY:  Clear to auscultation without rales, wheezing or rhonchi  ABDOMEN: Soft, non-tender, non-distended MUSCULOSKELETAL:  No edema; No deformity. 2+ pedal pulses, equal bilaterally SKIN: Warm and dry NEUROLOGIC:  Alert and oriented x 3 PSYCHIATRIC:  Normal affect   EKG:  EKG is not ordered today.         Diagnoses:    1. Pericardial effusion   2. Persistent atrial fibrillation (HCC)   3. Secondary hypercoagulable state (HCC)   4. Aortic aneurysm without rupture, unspecified portion of aorta (HCC)   5. Primary hypertension   6. Nonrheumatic aortic valve insufficiency    Assessment and Plan:     Pericardial effusion: Hospital admission for severe dyspnea  10/01/22. Large pericardial effusion with evidence of tamponade on echo 10/02/2022 s/p pericardiocentesis, removal of 950 cc bloody fluid. Has felt well since hospital d/c. No further symptoms of chest pain, shortness of breath, dyspnea or other concerns. Patient and family state no one has talked to them about the potential cause of the effusion. Reviewed notes from family medicine resident on 5/25 that reveal suspicion for malignant etiology. Will refer to oncology for evaluation.   Persistent atrial fibrillation on chronic anticoagulation: HR is well-controlled at 82 bpm. Clinically appears to be in sinus rhythm today. Is overall asymptomatic. No bleeding concerns.  Continue Eliquis 5 mg twice daily which is appropriate dose for stroke prevention for CHA2DS2-VASc score of 4.  Continue amiodarone for anti-arrhythmic agent. Stable TSH, liver function on recent labs. Lung function is stable.  Aortic valve insufficiency: Aortic valve insufficiency noted to be severe on echo 06/2021 with follow-up MR that showed only mild-moderate by volume assessment.  Aortic valve regurgitation noted to be moderate on echo 10/02/2022. She is  currently asymptomatic. Will continue to monitor clinically for now.   Aortic dilatation: Mild dilatation of the aortic root measuring 41 mm, moderate dilatation of the ascending aorta measuring 47 mm on echo 10/02/2022. Not felt to be surgical candidate by primary cardiologist. She is asymptomatic. We will continue to follow with annual imaging.      Disposition: 3-4 months with Dr. Elease Hashimoto  Medication Adjustments/Labs and Tests Ordered: Current medicines are reviewed at length with the patient today.  Concerns regarding medicines are outlined above.  Orders Placed This Encounter  Procedures   Ambulatory referral to Thoracic Oncology   No orders of the defined types were placed in this encounter.   Patient Instructions  Medication Instructions:   Your physician recommends that you continue on your current medications as directed. Please refer to the Current Medication list given to you today.   *If you need a refill on your cardiac medications before your next appointment, please call your pharmacy*   Lab Work:  None ordered.  If you have labs (blood work) drawn today and your tests are completely normal, you will receive your results only by: MyChart Message (if you have MyChart) OR A paper copy in the mail If you have any lab test that is abnormal or we need to change your treatment, we will call you to review the results.   Testing/Procedures:  None ordered.   Follow-Up: At [facility], you and your health needs are our priority.  As part of our continuing mission to provide you with exceptional heart care, we have created designated Provider Care Teams.  These Care Teams include your primary Cardiologist (physician) and Advanced Practice Providers (APPs -  Physician Assistants and Nurse Practitioners) who all work together to provide you with the care you need, when you need it.  We recommend signing up for the patient portal called "MyChart".  Sign up  information is provided on this After Visit Summary.  MyChart is used to connect with patients for Virtual Visits (Telemedicine).  Patients are able to view lab/test results, encounter notes, upcoming appointments, etc.  Non-urgent messages can be sent to your provider as well.   To learn more about what you can do with MyChart, go to [email or web address].    Your next appointment:   4 month(s)  Provider:   Kristeen Miss, MD     Other Instructions  You have been referred to oncology for pericardial effusion. The office will call you to schedule appointment.      Signed, Levi Aland, NP  10/16/2022 4:46 PM    [facility]

## 2022-10-16 ENCOUNTER — Ambulatory Visit: Payer: 59 | Attending: Nurse Practitioner | Admitting: Nurse Practitioner

## 2022-10-16 ENCOUNTER — Encounter: Payer: Self-pay | Admitting: Nurse Practitioner

## 2022-10-16 VITALS — BP 112/60 | HR 82 | Ht 65.0 in | Wt 146.4 lb

## 2022-10-16 DIAGNOSIS — D6869 Other thrombophilia: Secondary | ICD-10-CM | POA: Diagnosis not present

## 2022-10-16 DIAGNOSIS — I351 Nonrheumatic aortic (valve) insufficiency: Secondary | ICD-10-CM

## 2022-10-16 DIAGNOSIS — I3139 Other pericardial effusion (noninflammatory): Secondary | ICD-10-CM

## 2022-10-16 DIAGNOSIS — I4819 Other persistent atrial fibrillation: Secondary | ICD-10-CM | POA: Diagnosis not present

## 2022-10-16 DIAGNOSIS — I10 Essential (primary) hypertension: Secondary | ICD-10-CM

## 2022-10-16 DIAGNOSIS — I719 Aortic aneurysm of unspecified site, without rupture: Secondary | ICD-10-CM | POA: Diagnosis not present

## 2022-10-16 NOTE — Patient Instructions (Signed)
Medication Instructions:   Your physician recommends that you continue on your current medications as directed. Please refer to the Current Medication list given to you today.   *If you need a refill on your cardiac medications before your next appointment, please call your pharmacy*   Lab Work:  None ordered.  If you have labs (blood work) drawn today and your tests are completely normal, you will receive your results only by: MyChart Message (if you have MyChart) OR A paper copy in the mail If you have any lab test that is abnormal or we need to change your treatment, we will call you to review the results.   Testing/Procedures:  None ordered.   Follow-Up: At [facility], you and your health needs are our priority.  As part of our continuing mission to provide you with exceptional heart care, we have created designated Provider Care Teams.  These Care Teams include your primary Cardiologist (physician) and Advanced Practice Providers (APPs -  Physician Assistants and Nurse Practitioners) who all work together to provide you with the care you need, when you need it.  We recommend signing up for the patient portal called "MyChart".  Sign up information is provided on this After Visit Summary.  MyChart is used to connect with patients for Virtual Visits (Telemedicine).  Patients are able to view lab/test results, encounter notes, upcoming appointments, etc.  Non-urgent messages can be sent to your provider as well.   To learn more about what you can do with MyChart, go to [email or web address].    Your next appointment:   4 month(s)  Provider:   Kristeen Miss, MD     Other Instructions  You have been referred to oncology for pericardial effusion. The office will call you to schedule appointment.

## 2022-10-18 ENCOUNTER — Telehealth: Payer: Self-pay | Admitting: Physician Assistant

## 2022-10-18 NOTE — Telephone Encounter (Signed)
scheduled per referral, pt has been called and confirmed date and time. Pt is aware of location and to arrive early for check in   

## 2022-10-24 NOTE — Progress Notes (Signed)
[facility] [phone]   [phone]  INITIAL CONSULTATION:  Patient Care Team: Hillery Aldo, NP as PCP - General Nahser, Deloris Ping, MD as PCP - Cardiology (Cardiology) [facility], [facility]  CHIEF COMPLAINTS/PURPOSE OF CONSULTATION:  "Elevated tumor markers "  HISTORY OF PRESENTING ILLNESS:  Gloria Gallagher 72 y.o. female with medical history significant for persistent A-fib, hypertension, pericardial effusion with cardiac tamponade, acute respiratory failure, ESRD on dialysis, anemia and chronic kidney disease, carcinoma in situ of cervix.  On review of the previous records patient established care with [facility] in 09/2021 for evaluation of a liver lesion found on CTA. CT AP revealed multiple liver cysts. It was recommended patient have Cologuard screening and repeat CT AP which patient did not complete. AFP was collected at that time and elevated at 6.9.  Patient had recent 5 days hospitalization for acute hypoxic respiratory failure with cardiac effusion. Work up during admission showed elevated CA 125 and CA 19.9, unremarkable autoimmune and infectious labs. Pathology from pericardiocentesis with drain placement revealed nonmalignant cells. Abdominal US on 05/25 showed diffuse polycystic disease of liver, polycystic disease of bilateral kidneys, mild amount biliary sludge. Potential malignancy sources were thought to include 1 cm right lower lobe lung nodule seen on CTA, calcified hepatic lesion and multiple hepatic cysts and multiple renal cysts. Patient was encouraged to have outpatient colonoscopy.  On exam today patient denies any complaints.  She is accompanied by her good friend Gloria Gallagher.  Patient reports since her hospital discharge on 10/06/2022 she has mostly returned back to her normal self.  She admits to compliance with her dialysis sessions, goes MWF at [facility] in [place].  She admits  to bowel movements daily, denies constipation or melena.  Also denies any early satiety, back pain, hemoptysis, abdominal pain, vaginal bleeding.  Patient states she has never had a colonoscopy or mammogram.  She had Pap smears in the past that were normal, does not member when her last one was.  She has history of 1/2 ppd times at least 25 years.  She stopped smoking 20 years ago.  She admits to social drinking in the past, has not drank in over 1 year.  She lives here in [place] with her sister and has 1 daughter.  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  She denies any family history of cancer in her parents or 9 siblings.  MEDICAL HISTORY:  Past Medical History:  Diagnosis Date   Anemia    Aortic atherosclerosis (HCC)    Arthritis    Atrial fibrillation (HCC)    CAD (coronary artery disease)    COVID 2021   mild   Dialysis patient (HCC)    ESRD on hemodialysis (HCC)    MWF at [facility]   GERD (gastroesophageal reflux disease)    Gout    Heart murmur    Hyperparathyroidism (HCC)    Hypertension    in the past, no longer takes medications   Neuromuscular disorder (HCC)    neuropathy   Pneumonia    Seasonal allergies     SURGICAL HISTORY: Past Surgical History:  Procedure Laterality Date   CARDIOVERSION N/A 05/16/2021   Procedure: CARDIOVERSION;  Surgeon: Elease Hashimoto Deloris Ping, MD;  Location[facility] ENDOSCOPY;  Service: Cardiovascular;  Laterality: N/A;   CARDIOVERSION N/A 08/01/2021   Procedure: CARDIOVERSION;  Surgeon: Thurmon Fair, MD;  Location: [facility] ENDOSCOPY;  Service: Cardiovascular;  Laterality: N/A;  CARDIOVERSION N/A 12/05/2021   Procedure: CARDIOVERSION;  Surgeon: Thomasene Ripple, DO;  Location: [facility] ENDOSCOPY;  Service: Cardiovascular;  Laterality: N/A;   DIALYSIS FISTULA CREATION Left    PERICARDIOCENTESIS N/A 10/02/2022   Procedure: PERICARDIOCENTESIS;  Surgeon: Kathleene Hazel, MD;  Location: [facility] INVASIVE CV LAB;  Service: Cardiovascular;   Laterality: N/A;   REVISON OF ARTERIOVENOUS FISTULA Left 02/16/2022   Procedure: LEFT ARM ARTERIOVENOUS FISTULA REVISION;  Surgeon: Leonie Douglas, MD;  Location: [facility] OR;  Service: Vascular;  Laterality: Left;  PERIPHERAL NERVE BLOCK   TUBAL LIGATION      SOCIAL HISTORY: Social History   Socioeconomic History   Marital status: Single    Spouse name: Not on file   Number of children: 1   Years of education: Not on file   Highest education level: Not on file  Occupational History   Occupation: retired  Tobacco Use   Smoking status: Former    Types: Cigarettes    Passive exposure: Never   Smokeless tobacco: Never   Tobacco comments:    Former smoker 04/25/2021  Vaping Use   Vaping Use: Never used  Substance and Sexual Activity   Alcohol use: Yes    Alcohol/week: 1.0 standard drink of alcohol    Types: 1 Cans of beer per week    Comment: 1 beer every other week 06/20/21   Drug use: Never   Sexual activity: Not Currently  Other Topics Concern   Not on file  Social History Narrative   Not on file   Social Determinants of Health   Financial Resource Strain: Not on file  Food Insecurity: No Food Insecurity (01/18/2022)   Hunger Vital Sign    Worried About Running Out of Food in the Last Year: Never true    Ran Out of Food in the Last Year: Never true  Transportation Needs: No Transportation Needs (01/18/2022)   PRAPARE - Administrator, Civil Service (Medical): No    Lack of Transportation (Non-Medical): No  Physical Activity: Not on file  Stress: Not on file  Social Connections: Not on file  Intimate Partner Violence: Not At Risk (01/18/2022)   Humiliation, Afraid, Rape, and Kick questionnaire    Fear of Current or Ex-Partner: No    Emotionally Abused: No    Physically Abused: No    Sexually Abused: No    FAMILY HISTORY: Family History  Problem Relation Age of Onset   Diabetes Sister    Stomach cancer Neg Hx    Colon cancer Neg Hx     ALLERGIES:  is  allergic to penicillin g and shrimp (diagnostic).  MEDICATIONS:  Current Outpatient Medications  Medication Sig Dispense Refill   acetaminophen (TYLENOL) 500 MG tablet Take 2 tablets (1,000 mg total) by mouth every 6 (six) hours as needed for mild pain or moderate pain. 30 tablet 0   amiodarone (PACERONE) 200 MG tablet Take 1 tablet (200 mg total) by mouth daily. 90 tablet 2   apixaban (ELIQUIS) 5 MG TABS tablet TAKE 1 TABLET BY MOUTH TWICE A DAY 60 tablet 5   atorvastatin (LIPITOR) 20 MG tablet Take 20 mg by mouth daily.     diclofenac Sodium (VOLTAREN) 1 % GEL Apply 4 g topically 4 (four) times daily. (Patient taking differently: Apply 4 g topically daily as needed (For pain).) 100 g 0   Doxercalciferol (HECTOROL IV) Inject 1 Dose into the vein every Monday, Wednesday, and Friday with hemodialysis. During dialysis, 3X week  ferric citrate (AURYXIA) 1 GM 210 MG(Fe) tablet Take 1 tablet (210 mg total) by mouth 3 (three) times daily with meals. 270 tablet 1   multivitamin (RENA-VIT) TABS tablet Take 1 tablet by mouth at bedtime. 100 tablet 0   SENSIPAR 30 MG tablet Take 30 mg by mouth daily.     No current facility-administered medications for this visit.    REVIEW OF SYSTEMS:   All other systems are reviewed and are negative for acute change except as noted in the HPI.  PHYSICAL EXAMINATION: ECOG PERFORMANCE STATUS: 1 - Symptomatic but completely ambulatory  Vitals:   10/25/22 1423  BP: 132/65  Pulse: 71  Resp: 18  Temp: 97.8 F (36.6 C)  SpO2: 99%   Filed Weights   10/25/22 1423  Weight: 145 lb 9.6 oz (66 kg)    Physical Exam Vitals reviewed.  Constitutional:      General: She is not in acute distress.    Appearance: She is not ill-appearing.  HENT:     Head: Normocephalic.     Right Ear: External ear normal.     Left Ear: External ear normal.     Nose: Nose normal.     Mouth/Throat:     Mouth: Mucous membranes are moist.  Eyes:     General: No scleral  icterus. Cardiovascular:     Rate and Rhythm: Normal rate. Rhythm irregular.     Pulses: Normal pulses.  Pulmonary:     Effort: Pulmonary effort is normal.     Breath sounds: Normal breath sounds.  Abdominal:     General: There is distension (Baseline per patient).     Palpations: Abdomen is soft.     Tenderness: There is no abdominal tenderness. There is no guarding or rebound.  Musculoskeletal:        General: Normal range of motion.     Cervical back: Normal range of motion.     Right lower leg: No edema.     Left lower leg: No edema.  Skin:    General: Skin is warm.  Neurological:     Mental Status: She is alert.       LABORATORY DATA:  I have reviewed the data as listed    Latest Ref Rng & Units 10/25/2022    3:11 PM 10/06/2022    3:08 AM 10/05/2022    3:40 AM  CBC  WBC 4.0 - 10.5 K/uL 4.1  5.9  5.6   Hemoglobin 12.0 - 15.0 g/dL 29.5  62.1  30.8   Hematocrit 36.0 - 46.0 % 37.4  31.3  30.8   Platelets 150 - 400 K/uL 165  228  220        Latest Ref Rng & Units 10/25/2022    3:11 PM 10/06/2022    3:08 AM 10/05/2022    3:40 AM  CMP  Glucose 70 - 99 mg/dL 657  846  962   BUN 8 - 23 mg/dL 43  34  49   Creatinine 0.44 - 1.00 mg/dL 9.52  8.41  3.24   Sodium 135 - 145 mmol/L 138  133  134   Potassium 3.5 - 5.1 mmol/L 4.1  3.9  3.8   Chloride 98 - 111 mmol/L 93  93  94   CO2 22 - 32 mmol/L 33  28  26   Calcium 8.9 - 10.3 mg/dL 9.9  9.2  8.5   Total Protein 6.5 - 8.1 g/dL 7.2     Total Bilirubin 0.3 -  1.2 mg/dL 0.5     Alkaline Phos 38 - 126 U/L 170     AST 15 - 41 U/L 11     ALT 0 - 44 U/L 8        RADIOGRAPHIC STUDIES: I have personally reviewed the radiological images as listed and agreed with the findings in the report. US Abdomen Complete  Result Date: 10/06/2022 CLINICAL DATA:  Liver lesion. History of polycystic kidney disease and end-stage renal disease. EXAM: ABDOMEN ULTRASOUND COMPLETE COMPARISON:  CT of the chest on 10/01/2022 FINDINGS: Gallbladder:  Mild amount of dependent biliary sludge in the gallbladder. No gallstones, sonographic Murphy's sign or wall thickening. Common bile duct: Diameter: 6 mm Liver: Diffuse polycystic disease throughout the liver parenchyma. The largest measures cyst is on the order 3.5 cm. All of the cysts appear simple or minimally septated by ultrasound with no evidence of complicated or grossly hemorrhagic cysts. No solid masses are identified. The rim calcified area seen by CT is not characterized by ultrasound. No intrahepatic biliary ductal dilatation. Portal vein is patent on color Doppler imaging with normal direction of blood flow towards the liver. IVC: No abnormality visualized. Pancreas: Visualized portion unremarkable. Spleen: Size and appearance within normal limits. Right Kidney: Massively enlarged and polycystic. No definable normal parenchyma. No obvious complex cyst or solid mass. Left Kidney: Massively enlarged and polycystic. No definable normal parenchyma. No obvious complex cyst or solid mass. Abdominal aorta: Incompletely visualized abdominal aorta is normal in caliber. Other findings: No ascites visualized. IMPRESSION: 1. Diffuse polycystic disease of the liver. No solid masses are identified. The rim calcified area seen by CT is not characterized by ultrasound. 2. Polycystic disease of both kidneys with massive bilateral renal enlargement due to diffuse polycystic disease with no definable normal renal parenchyma bilaterally. 3. Mild amount of biliary sludge in the gallbladder. Electronically Signed   By: Irish Lack M.D.   On: 10/06/2022 09:53   ECHOCARDIOGRAM LIMITED  Result Date: 10/02/2022    ECHOCARDIOGRAM LIMITED REPORT   Patient Name:   Gloria Gallagher Date of Exam: 10/02/2022 Medical Rec #:  [id number]         Height:       65.0 in Accession #:    [id number]        Weight:       156.5 lb Date of Birth:  [birth year]        BSA:          1.782 m Patient Age:    71 years          BP:            60/40 mmHg Patient Gender: F                 HR:           73 bpm. Exam Location:  Inpatient Procedure: Limited Echo, Color Doppler, Cardiac Doppler and Echo            Guidance/Pericardial Tap Indications:    I31.3 Pericardial effusion  History:        Patient has prior history of Echocardiogram examinations, most                 recent 10/02/2022. Arrythmias:Atrial Fibrillation; Risk                 Factors:Hypertension.  Sonographer:    Irving Burton Senior RDCS Referring Phys: Verne Carrow, D  Sonographer Comments: Pericardiocentesis  Conclusion(s)/Recommendation(s): Periprocedural echo for pericardiocentesis. The  postprocedural images show a small residual effusion and resolution of RV and RA diastolic collapse and reduction in heart rate. There is a small residual pericardial effusion anterolateral to the left ventricle. Fibrinous strands are seen in that area and the effusion may be partially loculated. The vast majority of the effusion is successfully drained. FINDINGS  Aortic Valve: Aortic regurgitation PHT measures 503 msec. Additional Comments: Spectral Doppler performed. Color Doppler performed.  AORTIC VALVE AI PHT:      503 msec Thurmon Fair MD Electronically signed by Thurmon Fair MD Signature Date/Time: 10/02/2022/10:20:26 AM    Final    CARDIAC CATHETERIZATION  Result Date: 10/02/2022 Pericardial tamponade Successful pericardiocentesis with removal of 950 cc of bloody fluid Recommendations: Will leave pericardial drain in place for next 24-48 hours. Fluid sent to lab for analysis.   ECHOCARDIOGRAM COMPLETE  Result Date: 10/02/2022    ECHOCARDIOGRAM REPORT   Patient Name:   Gloria Gallagher Date of Exam: 10/02/2022 Medical Rec #:  [id number]         Height:       65.0 in Accession #:    [id number]        Weight:       156.5 lb Date of Birth:  1950/12/24        BSA:          1.782 m Patient Age:    71 years          BP:           81/64 mmHg Patient Gender: F                 HR:            106 bpm. Exam Location:  Inpatient Procedure: 2D Echo, Color Doppler and Cardiac Doppler Indications:    pericardial effusion  History:        Patient has prior history of Echocardiogram examinations.  Sonographer:    Irving Burton Senior Referring Phys: Simonne Martinet IMPRESSIONS  1. Left ventricular ejection fraction, by estimation, is 60 to 65%. The left ventricle has normal function. The left ventricle has no regional wall motion abnormalities. There is mild concentric left ventricular hypertrophy. Indeterminate diastolic filling due to E-A fusion.  2. Right ventricular systolic function is moderately reduced. The right ventricular size is mildly enlarged. Mildly increased right ventricular wall thickness. There is normal pulmonary artery systolic pressure. The estimated right ventricular systolic pressure is 35.6 mmHg.  3. Left atrial size was moderately dilated.  4. Right atrial size was moderately dilated.  5. There is marked cystic change of the left lobe of the liver and there is evidence of severe polycystic changes of the neighboring kidney tissue. Large pericardial effusion. Findings are consistent with cardiac tamponade.  6. The mitral valve is normal in structure. Trivial mitral valve regurgitation. No evidence of mitral stenosis.  7. The aortic valve is tricuspid. There is mild calcification of the aortic valve. Aortic valve regurgitation is moderate. Aortic valve sclerosis is present, with no evidence of aortic valve stenosis.  8. Pulmonic valve regurgitation is moderate.  9. Aortic dilatation noted. There is mild dilatation of the aortic root, measuring 41 mm. There is moderate dilatation of the ascending aorta, measuring 47 mm. 10. The inferior vena cava is dilated in size with <50% respiratory variability, suggesting right atrial pressure of 15 mmHg. Comparison(s): Prior images reviewed side by side. Changes from prior study are noted. There is a new large pericardial effusion with echo  evidence of  tamponade. Otherwise no change from the 07/04/2021 study. FINDINGS  Left Ventricle: Left ventricular ejection fraction, by estimation, is 60 to 65%. The left ventricle has normal function. The left ventricle has no regional wall motion abnormalities. The left ventricular internal cavity size was normal in size. There is  mild concentric left ventricular hypertrophy. Indeterminate diastolic filling due to E-A fusion. Right Ventricle: Suspect the RV systolic pressure does not reflect the severity of pulmonary artery hypertension due to the presence of pericardial tamponade. The right ventricular size is mildly enlarged. Mildly increased right ventricular wall thickness. Right ventricular systolic function is moderately reduced. There is normal pulmonary artery systolic pressure. The tricuspid regurgitant velocity is 2.27 m/s, and with an assumed right atrial pressure of 15 mmHg, the estimated right ventricular systolic pressure is 35.6 mmHg. Left Atrium: Left atrial size was moderately dilated. Right Atrium: Right atrial size was moderately dilated. Pericardium: There is marked cystic change of the left lobe of the liver and there is evidence of severe polycystic changes of the neighboring kidney tissue. A large pericardial effusion is present. There is diastolic collapse of the right ventricular free wall and diastolic collapse of the right atrial wall. There is evidence of cardiac tamponade. Mitral Valve: The mitral valve is normal in structure. Mild to moderate mitral annular calcification. Trivial mitral valve regurgitation. No evidence of mitral valve stenosis. Tricuspid Valve: The tricuspid valve is normal in structure. Tricuspid valve regurgitation is mild. Aortic Valve: The aortic valve is tricuspid. There is mild calcification of the aortic valve. Aortic valve regurgitation is moderate. Aortic valve sclerosis is present, with no evidence of aortic valve stenosis. Pulmonic Valve: The pulmonic valve was grossly  normal. Pulmonic valve regurgitation is moderate. Aorta: Aortic dilatation noted. There is mild dilatation of the aortic root, measuring 41 mm. There is moderate dilatation of the ascending aorta, measuring 47 mm. Venous: The inferior vena cava is dilated in size with less than 50% respiratory variability, suggesting right atrial pressure of 15 mmHg. IAS/Shunts: No atrial level shunt detected by color flow Doppler.  LEFT VENTRICLE PLAX 2D LVIDd:         4.00 cm   Diastology LVIDs:         2.90 cm   LV e' medial:    8.70 cm/s LV PW:         1.30 cm   LV E/e' medial:  7.4 LV IVS:        1.20 cm   LV e' lateral:   9.57 cm/s LVOT diam:     2.20 cm   LV E/e' lateral: 6.7 LV SV:         62 LV SV Index:   35 LVOT Area:     3.80 cm  RIGHT VENTRICLE RV S prime:     6.64 cm/s TAPSE (M-mode): 1.6 cm LEFT ATRIUM              Index        RIGHT ATRIUM           Index LA diam:        4.20 cm  2.36 cm/m   RA Area:     26.50 cm LA Vol (A2C):   126.0 ml 70.69 ml/m  RA Volume:   79.40 ml  44.55 ml/m LA Vol (A4C):   81.2 ml  45.58 ml/m LA Biplane Vol: 122.0 ml 68.45 ml/m  AORTIC VALVE LVOT Vmax:   115.00 cm/s LVOT Vmean:  86.300 cm/s  LVOT VTI:    0.164 m  AORTA Ao Root diam: 4.10 cm Ao Asc diam:  4.70 cm MITRAL VALVE               TRICUSPID VALVE MV Area (PHT): 2.90 cm    TR Peak grad:   20.6 mmHg MV Decel Time: 262 msec    TR Vmax:        227.00 cm/s MV E velocity: 64.00 cm/s MV A velocity: 56.30 cm/s  SHUNTS MV E/A ratio:  1.14        Systemic VTI:  0.16 m                            Systemic Diam: 2.20 cm Rachelle Hora Croitoru MD Electronically signed by Thurmon Fair MD Signature Date/Time: 10/02/2022/9:10:57 AM    Final    DG Chest Port 1 View  Result Date: 10/02/2022 CLINICAL DATA:  Acute respiratory failure. EXAM: PORTABLE CHEST 1 VIEW COMPARISON:  10/01/2022 FINDINGS: Marked enlargement of the cardiopericardial silhouette, stable. Interstitial markings are diffusely coarsened with chronic features. Similar retrocardiac  left base collapse/consolidation with small effusion. Bones are demineralized. Telemetry leads overlie the chest. IMPRESSION: 1. No substantial interval change. 2. Marked enlargement of the cardiopericardial silhouette, compatible with pericardial effusion is seen on CT scan yesterday. 3. Persistent retrocardiac left base collapse/consolidation with small effusion. Electronically Signed   By: Kennith Center M.D.   On: 10/02/2022 06:54   CT Angio Chest PE W and/or Wo Contrast  Result Date: 10/01/2022 CLINICAL DATA:  Concern for pulmonary embolism. EXAM: CT ANGIOGRAPHY CHEST WITH CONTRAST TECHNIQUE: Multidetector CT imaging of the chest was performed using the standard protocol during bolus administration of intravenous contrast. Multiplanar CT image reconstructions and MIPs were obtained to evaluate the vascular anatomy. RADIATION DOSE REDUCTION: This exam was performed according to the departmental dose-optimization program which includes automated exposure control, adjustment of the mA and/or kV according to patient size and/or use of iterative reconstruction technique. CONTRAST:  75mL OMNIPAQUE IOHEXOL 350 MG/ML SOLN COMPARISON:  Chest radiograph dated 10/01/2022 and CT dated 09/25/2022. FINDINGS: Evaluation of this exam is limited due to respiratory motion artifact. Cardiovascular: There is mild cardiomegaly. There has been interval development of a large pericardial effusion measuring up to 2.2 cm in thickness. Correlation with clinical exam recommended to evaluate for possibility of cardiac tamponade. Three vessel coronary vascular calcification. There is retrograde flow of contrast from the right atrium into the IVC suggestive of right heart dysfunction. There is moderate atherosclerotic calcification of the thoracic aorta. No aneurysmal dilatation. Dilatation of the main pulmonary trunk suggestive of pulmonary hypertension. Evaluation of the pulmonary arteries is limited due to respiratory motion and  suboptimal visualization of the peripheral branches. No central pulmonary artery embolus identified. Mediastinum/Nodes: No hilar or mediastinal adenopathy. The esophagus is grossly unremarkable. No mediastinal fluid collection. Lungs/Pleura: Background of emphysema. There is a 1 cm right lower lobe nodule (88/6). There are bibasilar atelectasis. Small left pleural effusion and left lung base atelectasis. Pneumonia is not excluded. Clinical correlation is recommended. There is no pneumothorax. The central airways are patent. Upper Abdomen: Polycystic renal and liver morphology. Similar appearance of calcification in the right lobe of the liver. Musculoskeletal: Renal osteodystrophy.  No acute osseous pathology. Review of the MIP images confirms the above findings. IMPRESSION: 1. No CT evidence of central pulmonary artery embolus. 2. Interval development of a large pericardial effusion. Correlation with clinical exam recommended to evaluate  for possibility of cardiac tamponade. 3. Small left pleural effusion and left lung base atelectasis. Pneumonia is not excluded. 4. A 1 cm right lower lobe nodule. Consider one of the following in 3 months for both low-risk and high-risk individuals: (a) repeat chest CT, (b) follow-up PET-CT, or (c) tissue sampling. This recommendation follows the consensus statement: Guidelines for Management of Incidental Pulmonary Nodules Detected on CT Images: From the Fleischner Society 2017; Radiology 2017; 284:228-243. 5.  Aortic Atherosclerosis (ICD10-I70.0). These results will be called to the ordering clinician or representative by the Radiologist Assistant, and communication documented in the PACS or [employer]. Electronically Signed   By: Elgie Collard M.D.   On: 10/01/2022 20:39   DG Chest Port 1 View  Result Date: 10/01/2022 CLINICAL DATA:  dyspnea EXAM: PORTABLE CHEST - 1 VIEW COMPARISON:  09/25/2022 FINDINGS: Interstitial prominence with pulmonary opacities improved at  the left lung base, slightly increased laterally in the right mid lung. Stable cardiomegaly. No effusion. Chronic calcifications inferior to the distal left scapula. IMPRESSION: Improving left lower lobe airspace disease. Electronically Signed   By: Corlis Leak M.D.   On: 10/01/2022 14:03    ASSESSMENT & PLAN Gloria Gallagher is a 72 y.o. female [facility] consultation regarding evaded tumor markers. We have reviewed etiologies including abdominal vs gynecological malignancy. Patient will proceed with laboratory workup today   #Elevated CA 125 and CA 19.9 -Plan to check basic labs today as well as the tumor markers that were found to be elevated including CA125 and 99 as well as AFP.  This was elevated when checked by GI back in 2023.  Will also get noncontrasted CT abdomen pelvis evaluating for possible malignancy. -Will also order mammogram and colonoscopy to get patient up-to-date on her cancer screenings.  Referral sent to [facility] as she is an established patient there. -If workup is negative, ?false elevations related to her ESRD. -Patient will RTC when work up is complete.  Patient expressed understanding of the recommended workup and is agreeable to move forward.   All questions were answered. The patient knows to call the clinic with any problems, questions or concerns.  Shared visit with Dr. Leonides Schanz  Orders Placed This Encounter  Procedures   CT Abdomen Pelvis Wo Contrast    Standing Status:   Future    Standing Expiration Date:   10/25/2023    Order Specific Question:   Preferred imaging location?    Answer:   [facility]    Order Specific Question:   If indicated for the ordered procedure, I authorize the administration of oral contrast media per Radiology protocol    Answer:   Yes    Order Specific Question:   Does the patient have a contrast media/X-ray dye allergy?    Answer:   No   MM DIAG BREAST TOMO BILATERAL    Standing  Status:   Future    Standing Expiration Date:   10/25/2023    Order Specific Question:   Reason for Exam (SYMPTOM  OR DIAGNOSIS REQUIRED)    Answer:   needs mammogram, malignancy work up    Order Specific Question:   Preferred imaging location?    Answer:   [facility]   CBC with Differential (Cancer Center Only)    Standing Status:   Future    Number of Occurrences:   1    Standing Expiration Date:   10/25/2023   CMP (Cancer Center only)  Standing Status:   Future    Number of Occurrences:   1    Standing Expiration Date:   10/25/2023   CA 125    Standing Status:   Future    Number of Occurrences:   1    Standing Expiration Date:   10/25/2023   CA 19.9    Standing Status:   Future    Number of Occurrences:   1    Standing Expiration Date:   10/25/2023   AFP tumor marker    Standing Status:   Future    Number of Occurrences:   1    Standing Expiration Date:   10/25/2023   Ambulatory referral to Gastroenterology    Referral Priority:   Urgent    Referral Type:   Consultation    Referral Reason:   Specialty Services Required    Number of Visits Requested:   1     I have spent a total of 60 minutes minutes of face-to-face and non-face-to-face time, preparing to see the patient, obtaining and/or reviewing separately obtained history, performing a medically appropriate examination, counseling and educating the patient, ordering medications/tests/procedures, referring and communicating with other health care professionals, documenting clinical information in the electronic health record, independently interpreting results and communicating results to the patient, and care coordination.   Namon Cirri PA-C Department of Hematology/Oncology [facility] at [facility] Phone: [phone]  I have read the above note and personally examined the patient. I agree with the assessment and plan as noted above.  Briefly Mrs. Gloria Gallagher is a 72 year old  female who presents for evaluation of elevated tumor markers.  During her recent hospitalization she had tumor markers ordered on 10/04/2022.  She was found to have a CA125 of 50.5 (elevated), CEA of 3.8 (normal), CA 19-9 of 38 (normal less than 35).  Due to concern for these findings he was referred to oncology for further evaluation and management.  We do not recommend the routine use of tumor markers and screening for cancer.  Unfortunately due to having positive results of these we will need to order CT imaging of the abdomen in order to rule out the presence of any malignancy.  Also of note the patient has kidney disease which may increase the concentration of these tumor markers in the blood above the normal range.  At this time we have a low suspicion that she has malignancy, however we will need imaging in order to confirm.  In the event there is no clear evidence of malignancy on the CT scan would recommend no further testing of these tumor markers.  The patient voiced understanding of our findings and the plan moving forward.   Ulysees Barns, MD Department of Hematology/Oncology [facility] at [facility] Phone: [phone] Pager: [phone] Email: [email or web address]

## 2022-10-25 ENCOUNTER — Inpatient Hospital Stay: Payer: 59

## 2022-10-25 ENCOUNTER — Inpatient Hospital Stay: Payer: 59 | Attending: Physician Assistant | Admitting: Physician Assistant

## 2022-10-25 ENCOUNTER — Other Ambulatory Visit: Payer: Self-pay

## 2022-10-25 ENCOUNTER — Ambulatory Visit: Payer: 59 | Admitting: Podiatry

## 2022-10-25 VITALS — BP 132/65 | HR 71 | Temp 97.8°F | Resp 18 | Wt 145.6 lb

## 2022-10-25 DIAGNOSIS — Z833 Family history of diabetes mellitus: Secondary | ICD-10-CM | POA: Diagnosis not present

## 2022-10-25 DIAGNOSIS — Z880 Allergy status to penicillin: Secondary | ICD-10-CM | POA: Diagnosis not present

## 2022-10-25 DIAGNOSIS — I4891 Unspecified atrial fibrillation: Secondary | ICD-10-CM | POA: Insufficient documentation

## 2022-10-25 DIAGNOSIS — Z7901 Long term (current) use of anticoagulants: Secondary | ICD-10-CM | POA: Insufficient documentation

## 2022-10-25 DIAGNOSIS — Z992 Dependence on renal dialysis: Secondary | ICD-10-CM | POA: Diagnosis not present

## 2022-10-25 DIAGNOSIS — N186 End stage renal disease: Secondary | ICD-10-CM | POA: Insufficient documentation

## 2022-10-25 DIAGNOSIS — I120 Hypertensive chronic kidney disease with stage 5 chronic kidney disease or end stage renal disease: Secondary | ICD-10-CM | POA: Diagnosis not present

## 2022-10-25 DIAGNOSIS — I2510 Atherosclerotic heart disease of native coronary artery without angina pectoris: Secondary | ICD-10-CM | POA: Insufficient documentation

## 2022-10-25 DIAGNOSIS — Z79899 Other long term (current) drug therapy: Secondary | ICD-10-CM | POA: Insufficient documentation

## 2022-10-25 DIAGNOSIS — Z8616 Personal history of COVID-19: Secondary | ICD-10-CM | POA: Diagnosis not present

## 2022-10-25 DIAGNOSIS — D631 Anemia in chronic kidney disease: Secondary | ICD-10-CM | POA: Diagnosis not present

## 2022-10-25 DIAGNOSIS — Z87891 Personal history of nicotine dependence: Secondary | ICD-10-CM | POA: Insufficient documentation

## 2022-10-25 DIAGNOSIS — R978 Other abnormal tumor markers: Secondary | ICD-10-CM | POA: Insufficient documentation

## 2022-10-25 LAB — CBC WITH DIFFERENTIAL (CANCER CENTER ONLY)
Abs Immature Granulocytes: 0.01 10*3/uL (ref 0.00–0.07)
Basophils Absolute: 0.1 10*3/uL (ref 0.0–0.1)
Basophils Relative: 2 %
Eosinophils Absolute: 0 10*3/uL (ref 0.0–0.5)
Eosinophils Relative: 1 %
HCT: 37.4 % (ref 36.0–46.0)
Hemoglobin: 11.6 g/dL — ABNORMAL LOW (ref 12.0–15.0)
Immature Granulocytes: 0 %
Lymphocytes Relative: 20 %
Lymphs Abs: 0.8 10*3/uL (ref 0.7–4.0)
MCH: 30.9 pg (ref 26.0–34.0)
MCHC: 31 g/dL (ref 30.0–36.0)
MCV: 99.5 fL (ref 80.0–100.0)
Monocytes Absolute: 0.5 10*3/uL (ref 0.1–1.0)
Monocytes Relative: 11 %
Neutro Abs: 2.7 10*3/uL (ref 1.7–7.7)
Neutrophils Relative %: 66 %
Platelet Count: 165 10*3/uL (ref 150–400)
RBC: 3.76 MIL/uL — ABNORMAL LOW (ref 3.87–5.11)
RDW: 15.9 % — ABNORMAL HIGH (ref 11.5–15.5)
WBC Count: 4.1 10*3/uL (ref 4.0–10.5)
nRBC: 0 % (ref 0.0–0.2)

## 2022-10-25 LAB — CMP (CANCER CENTER ONLY)
ALT: 8 U/L (ref 0–44)
AST: 11 U/L — ABNORMAL LOW (ref 15–41)
Albumin: 3.8 g/dL (ref 3.5–5.0)
Alkaline Phosphatase: 170 U/L — ABNORMAL HIGH (ref 38–126)
Anion gap: 12 (ref 5–15)
BUN: 43 mg/dL — ABNORMAL HIGH (ref 8–23)
CO2: 33 mmol/L — ABNORMAL HIGH (ref 22–32)
Calcium: 9.9 mg/dL (ref 8.9–10.3)
Chloride: 93 mmol/L — ABNORMAL LOW (ref 98–111)
Creatinine: 6.42 mg/dL — ABNORMAL HIGH (ref 0.44–1.00)
GFR, Estimated: 6 mL/min — ABNORMAL LOW (ref >60)
Glucose, Bld: 102 mg/dL — ABNORMAL HIGH (ref 70–99)
Potassium: 4.1 mmol/L (ref 3.5–5.1)
Sodium: 138 mmol/L (ref 135–145)
Total Bilirubin: 0.5 mg/dL (ref 0.3–1.2)
Total Protein: 7.2 g/dL (ref 6.5–8.1)

## 2022-10-26 ENCOUNTER — Other Ambulatory Visit: Payer: Self-pay | Admitting: *Deleted

## 2022-10-26 DIAGNOSIS — I739 Peripheral vascular disease, unspecified: Secondary | ICD-10-CM

## 2022-10-26 LAB — AFP TUMOR MARKER: AFP, Serum, Tumor Marker: 8.8 ng/mL (ref 0.0–9.2)

## 2022-10-26 LAB — CA 125: Cancer Antigen (CA) 125: 22.2 U/mL (ref 0.0–38.1)

## 2022-10-26 LAB — CANCER ANTIGEN 19-9: CA 19-9: 54 U/mL — ABNORMAL HIGH (ref 0–35)

## 2022-10-29 ENCOUNTER — Telehealth: Payer: Self-pay

## 2022-10-29 NOTE — Telephone Encounter (Signed)
This RN called and spoke with patient's contact person, Bonita Quin, regarding scheduled MRI appointment. RN then called patient and left a voicemail detailing date, time and location of appointment. Patient was given the central scheduling phone number should she need to reschedule appointment. This RN attempted to call patient again to touch base if this appointment will work for her, no answer.

## 2022-10-30 ENCOUNTER — Ambulatory Visit: Payer: 59

## 2022-10-30 ENCOUNTER — Ambulatory Visit (HOSPITAL_COMMUNITY): Payer: 59

## 2022-10-30 ENCOUNTER — Ambulatory Visit (HOSPITAL_BASED_OUTPATIENT_CLINIC_OR_DEPARTMENT_OTHER): Admission: RE | Admit: 2022-10-30 | Payer: 59 | Source: Ambulatory Visit

## 2022-11-01 ENCOUNTER — Ambulatory Visit (HOSPITAL_BASED_OUTPATIENT_CLINIC_OR_DEPARTMENT_OTHER): Payer: 59

## 2022-11-13 ENCOUNTER — Ambulatory Visit: Payer: 59 | Admitting: Podiatry

## 2022-11-16 ENCOUNTER — Telehealth: Payer: Self-pay

## 2022-11-16 NOTE — Telephone Encounter (Signed)
Patient cancelled her mammogram please advise her to reschedule as it was recommended by Dr. Leonides Schanz during her consultation  IT   spoke with pt's sister and pt said she is NOT getting a mammogram

## 2022-11-20 ENCOUNTER — Ambulatory Visit (HOSPITAL_COMMUNITY)
Admission: RE | Admit: 2022-11-20 | Discharge: 2022-11-20 | Disposition: A | Discharge status: Home / Self Care | Payer: 59 | Source: Ambulatory Visit | Attending: Physician Assistant | Admitting: Physician Assistant

## 2022-11-20 DIAGNOSIS — R978 Other abnormal tumor markers: Secondary | ICD-10-CM | POA: Insufficient documentation

## 2022-11-21 ENCOUNTER — Other Ambulatory Visit (HOSPITAL_COMMUNITY): Payer: Self-pay | Admitting: Physician Assistant

## 2022-11-22 ENCOUNTER — Ambulatory Visit (HOSPITAL_COMMUNITY)
Admission: RE | Admit: 2022-11-22 | Discharge: 2022-11-22 | Disposition: A | Discharge status: Home / Self Care | Payer: 59 | Source: Ambulatory Visit | Attending: Physician Assistant | Admitting: Physician Assistant

## 2022-11-22 ENCOUNTER — Ambulatory Visit (INDEPENDENT_AMBULATORY_CARE_PROVIDER_SITE_OTHER): Payer: 59 | Admitting: Physician Assistant

## 2022-11-22 VITALS — BP 137/71 | HR 78 | Temp 98.1°F | Resp 20 | Ht 65.0 in | Wt 144.9 lb

## 2022-11-22 DIAGNOSIS — I739 Peripheral vascular disease, unspecified: Secondary | ICD-10-CM | POA: Diagnosis not present

## 2022-11-22 LAB — VAS US ABI WITH/WO TBI
Left ABI: 0.73
Right ABI: 0.5

## 2022-11-22 NOTE — Progress Notes (Signed)
[facility] [place] HISTORY AND PHYSICAL   History of Present Illness:  Patient is a 72 y.o. year old female who presents for evaluation of claudication.  She has been seen in the past for ESRD.  She comes in today with report of right hip pain and numbness in the right LE.  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   She states her walking is limited by right hip pain and once she rests the pain subsides.  She denies calf claudication, but does report progressive numbness in the right foot.  She denies rest pain at night or non healing wounds.      She is on Eliquis for Afib and daily Stain.         Past Medical History:  Diagnosis Date   Anemia    Aortic atherosclerosis (HCC)    Arthritis    Atrial fibrillation (HCC)    CAD (coronary artery disease)    COVID 2021   mild   Dialysis patient (HCC)    ESRD on hemodialysis (HCC)    MWF at [facility]   GERD (gastroesophageal reflux disease)    Gout    Heart murmur    Hyperparathyroidism (HCC)    Hypertension    in the past, no longer takes medications   Neuromuscular disorder (HCC)    neuropathy   Pneumonia    Seasonal allergies     Past Surgical History:  Procedure Laterality Date   CARDIOVERSION N/A 05/16/2021   Procedure: CARDIOVERSION;  Surgeon: Elease Hashimoto Deloris Ping, MD;  Location: [facility] ENDOSCOPY;  Service: Cardiovascular;  Laterality: N/A;   CARDIOVERSION N/A 08/01/2021   Procedure: CARDIOVERSION;  Surgeon: Thurmon Fair, MD;  Location: [facility] ENDOSCOPY;  Service: Cardiovascular;  Laterality: N/A;   CARDIOVERSION N/A 12/05/2021   Procedure: CARDIOVERSION;  Surgeon: Thomasene Ripple, DO;  Location: [facility] ENDOSCOPY;  Service: Cardiovascular;  Laterality: N/A;   DIALYSIS FISTULA CREATION Left    PERICARDIOCENTESIS N/A 10/02/2022   Procedure: PERICARDIOCENTESIS;  Surgeon: Kathleene Hazel, MD;   Location: [facility] INVASIVE CV LAB;  Service: Cardiovascular;  Laterality: N/A;   REVISON OF ARTERIOVENOUS FISTULA Left 02/16/2022   Procedure: LEFT ARM ARTERIOVENOUS FISTULA REVISION;  Surgeon: Leonie Douglas, MD;  Location: [facility] OR;  Service: Vascular;  Laterality: Left;  PERIPHERAL NERVE BLOCK   TUBAL LIGATION      ROS:   General:  No weight loss, Fever, chills  HEENT: No recent headaches, no nasal bleeding, no visual changes, no sore throat  Neurologic: No dizziness, blackouts, seizures. No recent symptoms of stroke or mini- stroke. No recent episodes of slurred speech, or temporary blindness.  Cardiac: No recent episodes of chest pain/pressure, no shortness of breath at rest.  No shortness of breath with exertion.  Denies history of atrial fibrillation or irregular heartbeat  Vascular: No history of rest pain in feet.  No history of claudication.  No history of non-healing ulcer, No history of DVT   Pulmonary: No home oxygen, no productive cough, no hemoptysis,  No asthma or wheezing  Musculoskeletal:  [ ]  Arthritis, [ ]  Low back pain,  [ ]  Joint pain  Hematologic:No history of hypercoagulable state.  No history of easy bleeding.  No history of anemia  Gastrointestinal: No hematochezia or melena,  No gastroesophageal reflux, no trouble swallowing  Urinary: [ ]  chronic Kidney disease, [ ]  on HD - [ ]   MWF or [ ]  TTHS, [ ]  Burning with urination, [ ]  Frequent urination, [ ]  Difficulty urinating;   Skin: No rashes  Psychological: No history of anxiety,  No history of depression  Social History Social History   Tobacco Use   Smoking status: Former    Types: Cigarettes    Start date: 2004    Passive exposure: Never   Smokeless tobacco: Never   Tobacco comments:    Former smoker 04/25/2021  Vaping Use   Vaping status: Never Used  Substance Use Topics   Alcohol use: Yes    Alcohol/week: 1.0 standard drink of alcohol    Types: 1 Cans of beer per week    Comment: 1 beer every  other week 06/20/21   Drug use: Never    Family History Family History  Problem Relation Age of Onset   Diabetes Sister    Stomach cancer Neg Hx    Colon cancer Neg Hx     Allergies  Allergies  Allergen Reactions   Penicillin G Hives and Itching   Shrimp (Diagnostic) Hives     Current Outpatient Medications  Medication Sig Dispense Refill   acetaminophen (TYLENOL) 500 MG tablet Take 2 tablets (1,000 mg total) by mouth every 6 (six) hours as needed for mild pain or moderate pain. 30 tablet 0   apixaban (ELIQUIS) 5 MG TABS tablet TAKE 1 TABLET BY MOUTH TWICE A DAY 60 tablet 5   atorvastatin (LIPITOR) 20 MG tablet Take 20 mg by mouth daily.     diclofenac Sodium (VOLTAREN) 1 % GEL Apply 4 g topically 4 (four) times daily. (Patient taking differently: Apply 4 g topically daily as needed (For pain).) 100 g 0   ferric citrate (AURYXIA) 1 GM 210 MG(Fe) tablet Take 1 tablet (210 mg total) by mouth 3 (three) times daily with meals. 270 tablet 1   multivitamin (RENA-VIT) TABS tablet Take 1 tablet by mouth at bedtime. 100 tablet 0   SENSIPAR 30 MG tablet Take 30 mg by mouth daily.     amiodarone (PACERONE) 200 MG tablet TAKE 1 TABLET BY MOUTH DAILY 100 tablet 3   No current facility-administered medications for this visit.    Physical Examination  Vitals:   11/22/22 0819  BP: 137/71  Pulse: 78  Resp: 20  Temp: 98.1 F (36.7 C)  SpO2: 95%  Weight: 144 lb 14.4 oz (65.7 kg)  Height: 5\' 5"  (1.651 m)    Body mass index is 24.11 kg/m.  General:  Alert and oriented, no acute distress HEENT: Normal Neck: No bruit or JVD Pulmonary: Clear to auscultation bilaterally Cardiac: Regular Rate and Rhythm without murmur Abdomen: Soft, non-tender, non-distended, no mass, no scars Skin: No rash Extremity Pulses:  2+ radial,  left femoral palpable, right femoral non palpable Musculoskeletal: No deformity or edema  Neurologic: Upper and lower extremity motor grossly intact and  symmetric  DATA:  ABI Findings:  +---------+------------------+-----+-------------------+--------+  Right   Rt Pressure (mmHg)IndexWaveform           Comment   +---------+------------------+-----+-------------------+--------+  Brachial 141                                                 +---------+------------------+-----+-------------------+--------+  PTA     66                0.47 dampened  monophasic          +---------+------------------+-----+-------------------+--------+  DP      70                0.50 dampened monophasic          +---------+------------------+-----+-------------------+--------+  Great Toe                       Absent                       +---------+------------------+-----+-------------------+--------+   +---------+------------------+-----+----------+-------+  Left    Lt Pressure (mmHg)IndexWaveform  Comment  +---------+------------------+-----+----------+-------+  PTA     87                0.62 monophasic         +---------+------------------+-----+----------+-------+  DP      103               0.73 monophasic         +---------+------------------+-----+----------+-------+  Great Toe51                0.36 Abnormal           +---------+------------------+-----+----------+-------+   +-------+-----------+-----------+------------+------------+  ABI/TBIToday's ABIToday's TBIPrevious ABIPrevious TBI  +-------+-----------+-----------+------------+------------+  Right 0.50       0.00       0.39        0.11          +-------+-----------+-----------+------------+------------+  Left  0.73       0.36       0.42        0.24          +-------+-----------+-----------+------------+------------+      Summary:  Right: Resting right ankle-brachial index indicates moderate right lower  extremity arterial disease. The right toe-brachial index is abnormal.   Left: Resting left ankle-brachial index  indicates moderate left lower  extremity arterial disease. The left toe-brachial index is abnormal.   CT of Abdomin without contrast was done 11/20/22.   ASSESSMENT/PLAN:  Right hip claudication in the setting of know arthrosclerotic disease Her walking distance is shorter < 200 feet before hip pain stops her mobility.  She denies rest pain or non healing wounds.   Her claudication is interfering with her life.  The distance of ambulation is getting shorter.    I will schedule her for Aortogram with right iliac stenosis intervention if possible to relieve her right hip claudication.  She has non palpable right femoral pulse, but he does have a palpable left femoral pulse.  There is a CT without contrast that demonstrates calcification.       Acute hypoxemic and hypercapnic respiratory failure Acute pulmonary edema ESRD on HD MWF Moderate aortic insufficiency CAD Afib managed with Eliquis       Mosetta Pigeon PA-C [facility] [place] Office: [phone]  MD in clinic [place]

## 2022-11-27 ENCOUNTER — Telehealth: Payer: Self-pay | Admitting: Physician Assistant

## 2022-11-27 NOTE — Telephone Encounter (Signed)
I notified CARLOTTA TELFAIR by phone regarding CT AP results. Findings did not show evidence of intra abdominal or gyn malignancy.  Radiologist does comment on  11 mm right lower lobe pulmonary nodule that was seen on CTA chest on 10/01/22 as well as gallstones and sludge. I discussed these finding with patient and advised her to have PCP continue to monitor pulmonary nodule per Guidelines for Management of Incidental Pulmonary Nodules Detected on CT Images. She denies any recent history of or current abdominal pain. Patient aware to seek medical care if she were to develop abdominal pain. Discussed with patient as CT scan is reassuring, AFP and CA 125 were WNL, and CA 19-9 with minimal elevation which could be explained by ESRD vs gallstones she does not require additional work up with oncology at this time. All of patient's questions were answered and she expressed understanding of the plan provided.

## 2022-11-29 ENCOUNTER — Other Ambulatory Visit: Payer: Self-pay

## 2022-11-29 DIAGNOSIS — I771 Stricture of artery: Secondary | ICD-10-CM

## 2022-11-29 DIAGNOSIS — I739 Peripheral vascular disease, unspecified: Secondary | ICD-10-CM

## 2022-11-29 MED ORDER — SODIUM CHLORIDE 0.9% FLUSH: 3.0000 mL | Freq: Two times a day (BID) | INTRAVENOUS | Status: DC

## 2022-11-29 MED ORDER — SODIUM CHLORIDE 0.9 % IV SOLN: 250.0000 mL | INTRAVENOUS | Status: DC | PRN

## 2022-12-06 ENCOUNTER — Encounter (HOSPITAL_COMMUNITY): Admission: RE | Payer: Self-pay | Source: Home / Self Care

## 2022-12-06 ENCOUNTER — Ambulatory Visit (HOSPITAL_COMMUNITY): Admission: RE | Admit: 2022-12-06 | Payer: 59 | Source: Home / Self Care | Admitting: Vascular Surgery

## 2022-12-06 ENCOUNTER — Ambulatory Visit: Payer: 59

## 2022-12-06 SURGERY — ABDOMINAL AORTOGRAM W/LOWER EXTREMITY
Anesthesia: LOCAL

## 2022-12-07 ENCOUNTER — Other Ambulatory Visit: Payer: Self-pay

## 2022-12-07 ENCOUNTER — Telehealth: Payer: Self-pay

## 2022-12-07 NOTE — Telephone Encounter (Signed)
Pt called to r/s angiogram. Pt took Eliquis and procedure was canceled on 7/25.  Called pt, got verbal from pt to speak to her friend, Ronna Polio. Pt r/s for 12/13/22 @ 1000.

## 2022-12-10 ENCOUNTER — Ambulatory Visit: Payer: 59 | Admitting: Podiatry

## 2022-12-13 ENCOUNTER — Ambulatory Visit: Payer: 59 | Admitting: Podiatry

## 2022-12-13 ENCOUNTER — Encounter (HOSPITAL_COMMUNITY)
Admission: RE | Disposition: A | Discharge status: Home / Self Care | Payer: Self-pay | Source: Home / Self Care | Attending: Vascular Surgery

## 2022-12-13 ENCOUNTER — Other Ambulatory Visit: Payer: Self-pay

## 2022-12-13 ENCOUNTER — Ambulatory Visit (HOSPITAL_COMMUNITY)
Admission: RE | Admit: 2022-12-13 | Discharge: 2022-12-13 | Disposition: A | Discharge status: Home / Self Care | Payer: 59 | Attending: Vascular Surgery | Admitting: Vascular Surgery

## 2022-12-13 DIAGNOSIS — I2510 Atherosclerotic heart disease of native coronary artery without angina pectoris: Secondary | ICD-10-CM | POA: Insufficient documentation

## 2022-12-13 DIAGNOSIS — I351 Nonrheumatic aortic (valve) insufficiency: Secondary | ICD-10-CM | POA: Insufficient documentation

## 2022-12-13 DIAGNOSIS — Z992 Dependence on renal dialysis: Secondary | ICD-10-CM | POA: Diagnosis not present

## 2022-12-13 DIAGNOSIS — N186 End stage renal disease: Secondary | ICD-10-CM | POA: Insufficient documentation

## 2022-12-13 DIAGNOSIS — I70211 Atherosclerosis of native arteries of extremities with intermittent claudication, right leg: Secondary | ICD-10-CM | POA: Diagnosis not present

## 2022-12-13 DIAGNOSIS — I70213 Atherosclerosis of native arteries of extremities with intermittent claudication, bilateral legs: Secondary | ICD-10-CM | POA: Diagnosis present

## 2022-12-13 DIAGNOSIS — J9602 Acute respiratory failure with hypercapnia: Secondary | ICD-10-CM | POA: Insufficient documentation

## 2022-12-13 DIAGNOSIS — Z87891 Personal history of nicotine dependence: Secondary | ICD-10-CM | POA: Insufficient documentation

## 2022-12-13 DIAGNOSIS — Z7901 Long term (current) use of anticoagulants: Secondary | ICD-10-CM | POA: Diagnosis not present

## 2022-12-13 DIAGNOSIS — I739 Peripheral vascular disease, unspecified: Secondary | ICD-10-CM

## 2022-12-13 DIAGNOSIS — J810 Acute pulmonary edema: Secondary | ICD-10-CM | POA: Diagnosis not present

## 2022-12-13 DIAGNOSIS — I771 Stricture of artery: Secondary | ICD-10-CM

## 2022-12-13 DIAGNOSIS — I708 Atherosclerosis of other arteries: Secondary | ICD-10-CM | POA: Diagnosis not present

## 2022-12-13 DIAGNOSIS — I4891 Unspecified atrial fibrillation: Secondary | ICD-10-CM | POA: Insufficient documentation

## 2022-12-13 HISTORY — PX: ABDOMINAL AORTOGRAM W/LOWER EXTREMITY: CATH118223

## 2022-12-13 LAB — POCT I-STAT, CHEM 8
BUN: 43 mg/dL — ABNORMAL HIGH (ref 8–23)
Calcium, Ion: 1.09 mmol/L — ABNORMAL LOW (ref 1.15–1.40)
Chloride: 93 mmol/L — ABNORMAL LOW (ref 98–111)
Creatinine, Ser: 6.8 mg/dL — ABNORMAL HIGH (ref 0.44–1.00)
Glucose, Bld: 78 mg/dL (ref 70–99)
HCT: 43 % (ref 36.0–46.0)
Hemoglobin: 14.6 g/dL (ref 12.0–15.0)
Potassium: 4.2 mmol/L (ref 3.5–5.1)
Sodium: 136 mmol/L (ref 135–145)
TCO2: 36 mmol/L — ABNORMAL HIGH (ref 22–32)

## 2022-12-13 SURGERY — ABDOMINAL AORTOGRAM W/LOWER EXTREMITY
Anesthesia: LOCAL

## 2022-12-13 MED ORDER — LIDOCAINE HCL (PF) 1 % IJ SOLN
INTRAMUSCULAR | Status: DC | PRN
  Administered 2022-12-13: 10 mL

## 2022-12-13 MED ORDER — SODIUM CHLORIDE 0.9% FLUSH: 3.0000 mL | Freq: Two times a day (BID) | INTRAVENOUS | Status: DC

## 2022-12-13 MED ORDER — HYDRALAZINE HCL 20 MG/ML IJ SOLN
INTRAMUSCULAR | Status: DC | PRN
  Administered 2022-12-13: 10 mg via INTRAVENOUS

## 2022-12-13 MED ORDER — HEPARIN (PORCINE) IN NACL 1000-0.9 UT/500ML-% IV SOLN
INTRAVENOUS | Status: DC | PRN
  Administered 2022-12-13 (×2): 500 mL

## 2022-12-13 MED ORDER — SODIUM CHLORIDE 0.9% FLUSH: 3.0000 mL | INTRAVENOUS | Status: DC | PRN

## 2022-12-13 MED ORDER — LABETALOL HCL 5 MG/ML IV SOLN: 10.0000 mg | INTRAVENOUS | Status: DC | PRN

## 2022-12-13 MED ORDER — HYDRALAZINE HCL 20 MG/ML IJ SOLN
INTRAMUSCULAR | Status: AC
  Filled 2022-12-13: qty 1

## 2022-12-13 MED ORDER — MIDAZOLAM HCL 2 MG/2ML IJ SOLN
INTRAMUSCULAR | Status: DC | PRN
  Administered 2022-12-13: 1 mg via INTRAVENOUS

## 2022-12-13 MED ORDER — CILOSTAZOL 50 MG PO TABS: 50.0000 mg | ORAL_TABLET | Freq: Two times a day (BID) | ORAL | 11 refills | Status: DC

## 2022-12-13 MED ORDER — FENTANYL CITRATE (PF) 100 MCG/2ML IJ SOLN
INTRAMUSCULAR | Status: AC
  Filled 2022-12-13: qty 2

## 2022-12-13 MED ORDER — FENTANYL CITRATE (PF) 100 MCG/2ML IJ SOLN
INTRAMUSCULAR | Status: DC | PRN
  Administered 2022-12-13: 25 ug via INTRAVENOUS

## 2022-12-13 MED ORDER — HYDRALAZINE HCL 20 MG/ML IJ SOLN: 5.0000 mg | INTRAMUSCULAR | Status: DC | PRN

## 2022-12-13 MED ORDER — ONDANSETRON HCL 4 MG/2ML IJ SOLN: 4.0000 mg | Freq: Four times a day (QID) | INTRAMUSCULAR | Status: DC | PRN

## 2022-12-13 MED ORDER — IODIXANOL 320 MG/ML IV SOLN
INTRAVENOUS | Status: DC | PRN
  Administered 2022-12-13: 60 mL via INTRA_ARTERIAL

## 2022-12-13 MED ORDER — MIDAZOLAM HCL 2 MG/2ML IJ SOLN
INTRAMUSCULAR | Status: AC
  Filled 2022-12-13: qty 2

## 2022-12-13 MED ORDER — SODIUM CHLORIDE 0.9 % IV SOLN: 250.0000 mL | INTRAVENOUS | Status: DC | PRN

## 2022-12-13 MED ORDER — ACETAMINOPHEN 325 MG PO TABS: 650.0000 mg | ORAL_TABLET | ORAL | Status: DC | PRN

## 2022-12-13 MED ORDER — LIDOCAINE HCL (PF) 1 % IJ SOLN
INTRAMUSCULAR | Status: AC
  Filled 2022-12-13: qty 30

## 2022-12-13 SURGICAL SUPPLY — 9 items
CATH OMNI FLUSH 5F 65CM (CATHETERS) IMPLANT
COVER DOME SNAP 22 D (MISCELLANEOUS) IMPLANT
GUIDEWIRE ANGLED .035X150CM (WIRE) IMPLANT
KIT MICROPUNCTURE NIT STIFF (SHEATH) IMPLANT
SET ATX-X65L (MISCELLANEOUS) IMPLANT
SHEATH PINNACLE 5F 10CM (SHEATH) IMPLANT
SHEATH PROBE COVER 6X72 (BAG) IMPLANT
TRAY PV CATH (CUSTOM PROCEDURE TRAY) ×1 IMPLANT
WIRE STARTER BENTSON 035X150 (WIRE) IMPLANT

## 2022-12-13 NOTE — Discharge Instructions (Addendum)
May resume Eliquis on Friday assuming no issues with groin where the sheath was placed. Prescription sent to pharmacy for 50 mg Pletal twice daily to improve walking distances.  Will arrange follow-up in 2 to 3 months in the office.  Walk daily.

## 2022-12-13 NOTE — H&P (Signed)
History and Physical Interval Note:  12/13/2022 10:16 AM  Gloria Gallagher  has presented today for surgery, with the diagnosis of iilac steonsis w/ claudication.  The various methods of treatment have been discussed with the patient and family. After consideration of risks, benefits and other options for treatment, the patient has consented to  Procedure(s): ABDOMINAL AORTOGRAM W/LOWER EXTREMITY (N/A) as a surgical intervention.  The patient's history has been reviewed, patient examined, no change in status, stable for surgery.  I have reviewed the patient's chart and labs.  Questions were answered to the patient's satisfaction.    Right leg disabling claudication  [name]     [facility] [place] HISTORY AND PHYSICAL    History of Present Illness:  Patient is a 72 y.o. year old female who presents for evaluation of claudication.  She has been seen in the past for ESRD.  She comes in today with report of right hip pain and numbness in the right LE.  She has been seen by [facility] in the past and was last seen 12/19/21 with known atherosclerosis of native arteries of bilateral lower extremities causing claudication.              She states her walking is limited by right hip pain and once she rests the pain subsides.  She denies calf claudication, but does report progressive numbness in the right foot.  She denies rest pain at night or non healing wounds.                  She is on Eliquis for Afib and daily Stain.                            Past Medical History:  Diagnosis Date   Anemia     Aortic atherosclerosis (HCC)     Arthritis     Atrial fibrillation (HCC)     CAD (coronary artery disease)     COVID 2021    mild   Dialysis patient (HCC)     ESRD on hemodialysis (HCC)      MWF at [facility]   GERD (gastroesophageal reflux disease)     Gout     Heart murmur     Hyperparathyroidism (HCC)     Hypertension      in the past, no longer takes  medications   Neuromuscular disorder (HCC)      neuropathy   Pneumonia     Seasonal allergies                 Past Surgical History:  Procedure Laterality Date   CARDIOVERSION N/A 05/16/2021    Procedure: CARDIOVERSION;  Surgeon: Elease Hashimoto Deloris Ping, MD;  Location: [facility] ENDOSCOPY;  Service: Cardiovascular;  Laterality: N/A;   CARDIOVERSION N/A 08/01/2021    Procedure: CARDIOVERSION;  Surgeon: Thurmon Fair, MD;  Location: [facility] ENDOSCOPY;  Service: Cardiovascular;  Laterality: N/A;   CARDIOVERSION N/A 12/05/2021    Procedure: CARDIOVERSION;  Surgeon: Thomasene Ripple, DO;  Location: [facility] ENDOSCOPY;  Service: Cardiovascular;  Laterality: N/A;   DIALYSIS FISTULA CREATION Left     PERICARDIOCENTESIS N/A 10/02/2022    Procedure: PERICARDIOCENTESIS;  Surgeon: Kathleene Hazel, MD;  Location: [facility] INVASIVE CV LAB;  Service: Cardiovascular;  Laterality: N/A;   REVISON OF ARTERIOVENOUS FISTULA Left 02/16/2022    Procedure: LEFT ARM ARTERIOVENOUS FISTULA REVISION;  Surgeon: Leonie Douglas, MD;  Location: [facility] OR;  Service: Vascular;  Laterality: Left;  PERIPHERAL NERVE BLOCK   TUBAL LIGATION              ROS:    General:  No weight loss, Fever, chills   HEENT: No recent headaches, no nasal bleeding, no visual changes, no sore throat   Neurologic: No dizziness, blackouts, seizures. No recent symptoms of stroke or mini- stroke. No recent episodes of slurred speech, or temporary blindness.   Cardiac: No recent episodes of chest pain/pressure, no shortness of breath at rest.  No shortness of breath with exertion.  Denies history of atrial fibrillation or irregular heartbeat   Vascular: No history of rest pain in feet.  No history of claudication.  No history of non-healing ulcer, No history of DVT    Pulmonary: No home oxygen, no productive cough, no hemoptysis,  No asthma or wheezing   Musculoskeletal:  [ ]  Arthritis, [ ]  Low back pain,  [ ]  Joint pain   Hematologic:No history of  hypercoagulable state.  No history of easy bleeding.  No history of anemia   Gastrointestinal: No hematochezia or melena,  No gastroesophageal reflux, no trouble swallowing   Urinary: [ ]  chronic Kidney disease, [ ]  on HD - [ ]  MWF or [ ]  TTHS, [ ]  Burning with urination, [ ]  Frequent urination, [ ]  Difficulty urinating;    Skin: No rashes   Psychological: No history of anxiety,  No history of depression   Social History Social History  Social History         Tobacco Use   Smoking status: Former      Types: Cigarettes      Start date: 2004      Passive exposure: Never   Smokeless tobacco: Never   Tobacco comments:      Former smoker 04/25/2021  Vaping Use   Vaping status: Never Used  Substance Use Topics   Alcohol use: Yes      Alcohol/week: 1.0 standard drink of alcohol      Types: 1 Cans of beer per week      Comment: 1 beer every other week 06/20/21   Drug use: Never        Family History      Family History  Problem Relation Age of Onset   Diabetes Sister     Stomach cancer Neg Hx     Colon cancer Neg Hx            Allergies   Allergies      Allergies  Allergen Reactions   Penicillin G Hives and Itching   Shrimp (Diagnostic) Hives                Current Outpatient Medications  Medication Sig Dispense Refill   acetaminophen (TYLENOL) 500 MG tablet Take 2 tablets (1,000 mg total) by mouth every 6 (six) hours as needed for mild pain or moderate pain. 30 tablet 0   apixaban (ELIQUIS) 5 MG TABS tablet TAKE 1 TABLET BY MOUTH TWICE A DAY 60 tablet 5   atorvastatin (LIPITOR) 20 MG tablet Take 20 mg by mouth daily.       diclofenac Sodium (VOLTAREN) 1 % GEL Apply 4 g topically 4 (four) times daily. (Patient taking differently: Apply 4 g topically daily as needed (For pain).) 100 g 0   ferric citrate (AURYXIA) 1 GM 210 MG(Fe) tablet Take 1 tablet (210 mg total) by mouth 3 (three) times daily with meals. 270 tablet 1   multivitamin (  RENA-VIT) TABS tablet Take  1 tablet by mouth at bedtime. 100 tablet 0   SENSIPAR 30 MG tablet Take 30 mg by mouth daily.       amiodarone (PACERONE) 200 MG tablet TAKE 1 TABLET BY MOUTH DAILY 100 tablet 3      No current facility-administered medications for this visit.        Physical Examination      Vitals:    11/22/22 0819  BP: 137/71  Pulse: 78  Resp: 20  Temp: 98.1 F (36.7 C)  SpO2: 95%  Weight: 144 lb 14.4 oz (65.7 kg)  Height: 5\' 5"  (1.651 m)      Body mass index is 24.11 kg/m.   General:  Alert and oriented, no acute distress HEENT: Normal Neck: No bruit or JVD Pulmonary: Clear to auscultation bilaterally Cardiac: Regular Rate and Rhythm without murmur Abdomen: Soft, non-tender, non-distended, no mass, no scars Skin: No rash Extremity Pulses:  2+ radial,  left femoral palpable, right femoral non palpable Musculoskeletal: No deformity or edema      Neurologic: Upper and lower extremity motor grossly intact and symmetric   DATA:  ABI Findings:  +---------+------------------+-----+-------------------+--------+  Right   Rt Pressure (mmHg)IndexWaveform           Comment   +---------+------------------+-----+-------------------+--------+  Brachial 141                                                 +---------+------------------+-----+-------------------+--------+  PTA     66                0.47 dampened monophasic          +---------+------------------+-----+-------------------+--------+  DP      70                0.50 dampened monophasic          +---------+------------------+-----+-------------------+--------+  Great Toe                       Absent                       +---------+------------------+-----+-------------------+--------+   +---------+------------------+-----+----------+-------+  Left    Lt Pressure (mmHg)IndexWaveform  Comment  +---------+------------------+-----+----------+-------+  PTA     87                0.62 monophasic          +---------+------------------+-----+----------+-------+  DP      103               0.73 monophasic         +---------+------------------+-----+----------+-------+  Great Toe51                0.36 Abnormal           +---------+------------------+-----+----------+-------+   +-------+-----------+-----------+------------+------------+  ABI/TBIToday's ABIToday's TBIPrevious ABIPrevious TBI  +-------+-----------+-----------+------------+------------+  Right 0.50       0.00       0.39        0.11          +-------+-----------+-----------+------------+------------+  Left  0.73       0.36       0.42        0.24          +-------+-----------+-----------+------------+------------+      Summary:  Right: Resting right  ankle-brachial index indicates moderate right lower  extremity arterial disease. The right toe-brachial index is abnormal.   Left: Resting left ankle-brachial index indicates moderate left lower  extremity arterial disease. The left toe-brachial index is abnormal.   CT of Abdomin without contrast was done 11/20/22.     ASSESSMENT/PLAN:  Right hip claudication in the setting of know arthrosclerotic disease Her walking distance is shorter < 200 feet before hip pain stops her mobility.  She denies rest pain or non healing wounds.   Her claudication is interfering with her life.  The distance of ambulation is getting shorter.               I will schedule her for Aortogram with right iliac stenosis intervention if possible to relieve her right hip claudication.  She has non palpable right femoral pulse, but he does have a palpable left femoral pulse.  There is a CT without contrast that demonstrates calcification.                             Acute hypoxemic and hypercapnic respiratory failure Acute pulmonary edema ESRD on HD MWF Moderate aortic insufficiency CAD Afib managed with Eliquis             Mosetta Pigeon PA-C [facility] [place] Office: [phone]   MD in clinic [place]

## 2022-12-13 NOTE — Progress Notes (Signed)
63fr sheath aspirated and removed from left femoral artery. Manual pressure applied for 25 minutes. Site level 0 , no s+s of hematoma. Tegaderm dressing applied, bedrest instructions given.  Bilateral dp and pt pulses present with doppler, but right sided pulses much weaker/diminished.  Bedrest begins at 12:50:00

## 2022-12-13 NOTE — Op Note (Signed)
    Patient name: Gloria Gallagher MRN: [id number] DOB: 20-Sep-1950 Sex: female  12/13/2022 Pre-operative Diagnosis: Lifestyle-limiting claudication right leg Post-operative diagnosis:  Same Surgeon:  [name], MD Procedure Performed: 1.  US guided access left common femoral artery 2.  Aortogram with catheter selection of aorta 3.  Right lower extremity arteriogram with catheter selection of right external iliac artery 4.  19 minutes of monitored moderate conscious sedation time  Indications: Patient is a 72 year old female seen in the [facility] clinic with worsening claudication in the right leg felt to be lifestyle limiting.  She presents today for angiogram with a focus on the right leg after risks benefits discussed.  Findings:   Aortogram showed that her infrarenal aorta is patent.  Her bilateral common and external iliac arteries are patent but severely calcified.  On the right, I did not see any flow-limiting stenosis in the iliac artery.  The right distal external iliac/common femoral has about 50% stenosis with a patent profunda.  She has a palpable right femoral pulse. The left external iliac artery has a high grade calcified stenosis.   The right SFA is diminutive proximally with high-grade stenosis proximal to mid segment and then occludes with a long segment chronic total occlusion heavily calcified.  She reconstitutes an above-knee popliteal artery that has a high-grade stenosis.  The below knee popliteal artery is patent.  She only has single-vessel peroneal runoff that is small and diseased.   Procedure:  The patient was identified in the holding area and taken to room 8.  The patient was then placed supine on the table and prepped and draped in the usual sterile fashion.  A time out was called.  The patient received Versed and fentanyl for conscious moderate sedation.  Vital signs were monitored including heart rate, respiratory rate, oxygenation and blood pressure.  I was  present for all of moderate sedation.  Ultrasound was used to evaluate the left common femoral artery.  It was patent .  A digital ultrasound image was acquired.  A micropuncture needle was used to access the left common femoral artery under ultrasound guidance.  An 018 wire was advanced without resistance and a micropuncture sheath was placed.  The 018 wire was removed and a benson wire was placed.  The micropuncture sheath was exchanged for a 5 french sheath.  An omniflush catheter was advanced over the wire to the level of L-1.  An abdominal angiogram was obtained.  Next, using the omniflush catheter and a benson wire, the aortic bifurcation was crossed and the catheter was placed into theright external iliac artery and right runoff was obtained.  Pertinent findings are noted above.  She tells me that she can walk over half a block without symptoms.  I feel intervention is too high risk given single-vessel diminutive peroneal runoff.  Will pursue medical therapy.  Wires and catheters were removed.  Taken to holding to have the sheath removed from her femoral artery.  Plan: She tells me that she can walk over half a block without symptoms and has intermittent claudication.  I feel intervention is too high risk given single-vessel diminutive peroneal runoff.  Will pursue medical therapy and have prescribed pletal.  2-3 month follow-up in the office.    [name], MD [facility] [place] Office: [phone]

## 2022-12-14 ENCOUNTER — Encounter (HOSPITAL_COMMUNITY): Payer: Self-pay | Admitting: Vascular Surgery

## 2022-12-17 ENCOUNTER — Telehealth: Payer: Self-pay

## 2022-12-17 NOTE — Telephone Encounter (Signed)
Pt's friend, Bonita Quin, called stating that the pt had a reaction to the new medication prescribed by Dr. Chestine Spore.  Reviewed pt's chart, returned call for clarification, two identifiers used. Pt has had increased HR and anxiety. She first noticed it Saturday and that's when she stopped it. She has felt better since then. Informed her that Dr. Chestine Spore was out of the office until next week, but he would be informed and see what his advice is. Confirmed understanding.

## 2022-12-21 ENCOUNTER — Other Ambulatory Visit (HOSPITAL_COMMUNITY): Payer: Self-pay | Admitting: Physician Assistant

## 2022-12-21 DIAGNOSIS — I4819 Other persistent atrial fibrillation: Secondary | ICD-10-CM

## 2022-12-24 NOTE — Telephone Encounter (Signed)
Prescription refill request for Eliquis received. Indication:afib Last office visit:6/24 Scr:6.8 Age: 72 Weight:66.2  kg  Prescription refilled

## 2023-02-18 ENCOUNTER — Encounter: Payer: Self-pay | Admitting: Cardiovascular Disease

## 2023-02-18 NOTE — Progress Notes (Unsigned)
Cardiology Office Note:    Date:  02/19/2023   ID:  Gloria Gallagher, DOB Apr 18, 1951, MRN [id number]  PCP:  Hillery Aldo, NP   [facility] Providers Cardiologist:  Iveliz Garay  }    Referring MD: Hillery Aldo, NP   Chief Complaint  Patient presents with   Atrial Fibrillation    Feb. 15, 2023   Gloria Gallagher is a 72 y.o. female with a hx of ESRD, aortic sclerosis, carotid artery disease, atrial fib She is s/p  successful cardioversion  Doing well from a cardiac standpoint Feeling well after cardioversion  Some DOE climbing stairs  Has been on dialysis > 10 years .   ECG today shows she is back in afib   November 16, 2021 Gloria Gallagher is seen today for follow up of her atrial fib She had a successful cardioversion but went back into afib  We loaded her on amio  She has seen Merilynn Finland ( [facility])   Nov. 7, 2023  Gloria Gallagher is seen for follow up of her atrial fib Follows with Dr. Neysa Bonito for her dilated pulm artery  Tolerating the amio.   Had revision of her dialysis fistula   February 19, 2023: Gloria Gallagher is seen today for follow-up of her paroxysmal atrial fibrillation, dilated pulmonary artery, aortic insufficiency with moderate dilatation of her ascending aorta.  She was admitted on Oct 01, 2022  with severe shortness of breath.  She was found to have a large pericardial effusion.  This was associated with hypotension during dialysis sessions.  She had a pericardiocentesis and felt much better.  CA 125 levels were were elevated.   CA 125 = 50.5 on Oct 04, 2022 ( 0-38 normal )  Came down to 22.2 by June 13   Dialysis sessions are going well   Echo from Oct 02, 2022 reveals normal left ventricular systolic function.  She has moderate aortic insufficiency. She she had moderate dilatation of her ascending aorta measuring 47 mm.  A large pericardial effusion was found which was later tapped. Her ascending aortic dilatation is unchanged from her previous echo in 2023.   Past Medical  History:  Diagnosis Date   Anemia    Aortic atherosclerosis (HCC)    Arthritis    Atrial fibrillation (HCC)    CAD (coronary artery disease)    COVID 2021   mild   Dialysis patient (HCC)    ESRD on hemodialysis (HCC)    MWF at [facility]   GERD (gastroesophageal reflux disease)    Gout    Heart murmur    Hyperparathyroidism (HCC)    Hypertension    in the past, no longer takes medications   Neuromuscular disorder (HCC)    neuropathy   Pneumonia    Seasonal allergies     Past Surgical History:  Procedure Laterality Date   ABDOMINAL AORTOGRAM W/LOWER EXTREMITY N/A 12/13/2022   Procedure: ABDOMINAL AORTOGRAM W/LOWER EXTREMITY;  Surgeon: [name], MD;  Location: [facility] INVASIVE CV LAB;  Service: Cardiovascular;  Laterality: N/A;   CARDIOVERSION N/A 05/16/2021   Procedure: CARDIOVERSION;  Surgeon: Elease Hashimoto Deloris Ping, MD;  Location: [facility] ENDOSCOPY;  Service: Cardiovascular;  Laterality: N/A;   CARDIOVERSION N/A 08/01/2021   Procedure: CARDIOVERSION;  Surgeon: Thurmon Fair, MD;  Location: [facility] ENDOSCOPY;  Service: Cardiovascular;  Laterality: N/A;   CARDIOVERSION N/A 12/05/2021   Procedure: CARDIOVERSION;  Surgeon: Thomasene Ripple, DO;  Location: [facility] ENDOSCOPY;  Service: Cardiovascular;  Laterality: N/A;   DIALYSIS FISTULA CREATION Left  PERICARDIOCENTESIS N/A 10/02/2022   Procedure: PERICARDIOCENTESIS;  Surgeon: Kathleene Hazel, MD;  Location[facility] INVASIVE CV LAB;  Service: Cardiovascular;  Laterality: N/A;   REVISON OF ARTERIOVENOUS FISTULA Left 02/16/2022   Procedure: LEFT ARM ARTERIOVENOUS FISTULA REVISION;  Surgeon: Leonie Douglas, MD;  Location: [facility] OR;  Service: Vascular;  Laterality: Left;  PERIPHERAL NERVE BLOCK   TUBAL LIGATION      Current Medications: Current Meds  Medication Sig   amiodarone (PACERONE) 200 MG tablet TAKE 1 TABLET BY MOUTH DAILY   atorvastatin (LIPITOR) 20 MG tablet Take 20 mg by mouth at bedtime.   cilostazol (PLETAL) 50 MG tablet Take 1  tablet (50 mg total) by mouth 2 (two) times daily.   ELIQUIS 5 MG TABS tablet TAKE 1 TABLET BY MOUTH TWICE  DAILY   ferric citrate (AURYXIA) 1 GM 210 MG(Fe) tablet Take 1 tablet (210 mg total) by mouth 3 (three) times daily with meals.   multivitamin (RENA-VIT) TABS tablet Take 1 tablet by mouth at bedtime.   Current Facility-Administered Medications for the 02/19/23 encounter (Office Visit) with Katana Berthold, Deloris Ping, MD  Medication   0.9 %  sodium chloride infusion   sodium chloride flush (NS) 0.9 % injection 3 mL     Allergies:   Penicillin g and Shrimp (diagnostic)   Social History   Socioeconomic History   Marital status: Single    Spouse name: Not on file   Number of children: 1   Years of education: Not on file   Highest education level: Not on file  Occupational History   Occupation: retired  Tobacco Use   Smoking status: Former    Types: Cigarettes    Start date: 2004    Passive exposure: Never   Smokeless tobacco: Never   Tobacco comments:    Former smoker 04/25/2021  Vaping Use   Vaping status: Never Used  Substance and Sexual Activity   Alcohol use: Yes    Alcohol/week: 1.0 standard drink of alcohol    Types: 1 Cans of beer per week    Comment: 1 beer every other week 06/20/21   Drug use: Never   Sexual activity: Not Currently  Other Topics Concern   Not on file  Social History Narrative   Not on file   Social Determinants of Health   Financial Resource Strain: Not on file  Food Insecurity: No Food Insecurity (01/18/2022)   Hunger Vital Sign    Worried About Running Out of Food in the Last Year: Never true    Ran Out of Food in the Last Year: Never true  Transportation Needs: No Transportation Needs (01/18/2022)   PRAPARE - Administrator, Civil Service (Medical): No    Lack of Transportation (Non-Medical): No  Physical Activity: Not on file  Stress: Not on file  Social Connections: Not on file     Family History: The patient's family history  includes Diabetes in her sister. There is no history of Stomach cancer or Colon cancer.  ROS:   Please see the history of present illness.     All other systems reviewed and are negative.  EKGs/Labs/Other Studies Reviewed:    The following studies were reviewed today:   EKG:          Recent Labs: 10/02/2022: B Natriuretic Peptide 546.5 10/03/2022: Magnesium 2.1 10/06/2022: TSH 1.909 10/25/2022: ALT 8; Platelet Count 165 12/13/2022: BUN 43; Creatinine, Ser 6.80; Hemoglobin 14.6; Potassium 4.2; Sodium 136  Recent Lipid Panel No  results found for: "CHOL", "TRIG", "HDL", "CHOLHDL", "VLDL", "LDLCALC", "LDLDIRECT"   Risk Assessment/Calculations:    CHA2DS2-VASc Score = 4   This indicates a 4.8% annual risk of stroke. The patient's score is based upon: CHF History: 0 HTN History: 1 Diabetes History: 0 Stroke History: 0 Vascular Disease History: 1 Age Score: 1 Gender Score: 1       Physical Exam:     Physical Exam: Blood pressure (!) 112/58, pulse 65, height 5\' 5"  (1.651 m), weight 143 lb (64.9 kg), SpO2 91%.       GEN:  somewhat frail, elderly female  in no acute distress HEENT: Normal NECK: No JVD; soft carotid bruits  LYMPHATICS: No lymphadenopathy CARDIAC: RRR,  irreg. Irreg.,  2/6 diastolic murmur  RESPIRATORY:  Clear to auscultation without rales, wheezing or rhonchi  ABDOMEN: Soft, non-tender, non-distended MUSCULOSKELETAL:  No edema; bulging dialysis fistula in her left arm  SKIN: Warm and dry NEUROLOGIC: generally very weak      ASSESSMENT:    1. Cardiac tamponade   2. Primary hypertension   3. Persistent atrial fibrillation (HCC)   4. Nonrheumatic aortic valve insufficiency   5. ESRD (end stage renal disease) on dialysis [facility])     PLAN:       Atrial fib:    persistent ,  cont amio. Cont eliquis    Hypertension:   BP is stable    3.  Aortic insufficiency: .   Has moderate AI.    Unchanged .  5.  Dilatation of asc. 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   47 mm.  Unchanged from her previous echo . She is fairly frail  .   She is not a good candidate for  surgical repair.  If the dilatation worsens, we may need to have further discussion .   4.  Pulmonary artery aneurysm:  :   stable.  She is a poor candidate for repair   5.  ESRD :                  Medication Adjustments/Labs and Tests Ordered: Current medicines are reviewed at length with the patient today.  Concerns regarding medicines are outlined above.  No orders of the defined types were placed in this encounter.  No orders of the defined types were placed in this encounter.   Patient Instructions  Medication Instructions:  Your physician recommends that you continue on your current medications as directed. Please refer to the Current Medication list given to you today.  *If you need a refill on your cardiac medications before your next appointment, please call your pharmacy*   Lab Work: NONE If you have labs (blood work) drawn today and your tests are completely normal, you will receive your results only by: MyChart Message (if you have MyChart) OR A paper copy in the mail If you have any lab test that is abnormal or we need to change your treatment, we will call you to review the results.  Testing/Procedures: NONE  Follow-Up: At [facility], you and your health needs are our priority.  As part of our continuing mission to provide you with exceptional heart care, we have created designated Provider Care Teams.  These Care Teams include your primary Cardiologist (physician) and Advanced Practice Providers (APPs -  Physician Assistants and Nurse Practitioners) who all work together to provide you with the care you need, when you need it.  Your next appointment:   1 year(s)  Provider:   Charlton Haws, MD       Signed, Kristeen Miss, MD  02/19/2023 9:43 AM    [facility]

## 2023-02-19 ENCOUNTER — Encounter: Payer: Self-pay | Admitting: Cardiovascular Disease

## 2023-02-19 ENCOUNTER — Ambulatory Visit: Payer: 59 | Attending: Cardiovascular Disease | Admitting: Cardiovascular Disease

## 2023-02-19 VITALS — BP 112/58 | HR 65 | Ht 65.0 in | Wt 143.0 lb

## 2023-02-19 DIAGNOSIS — I10 Essential (primary) hypertension: Secondary | ICD-10-CM | POA: Diagnosis not present

## 2023-02-19 DIAGNOSIS — I314 Cardiac tamponade: Secondary | ICD-10-CM | POA: Diagnosis not present

## 2023-02-19 DIAGNOSIS — I351 Nonrheumatic aortic (valve) insufficiency: Secondary | ICD-10-CM | POA: Diagnosis not present

## 2023-02-19 DIAGNOSIS — Z992 Dependence on renal dialysis: Secondary | ICD-10-CM

## 2023-02-19 DIAGNOSIS — I4819 Other persistent atrial fibrillation: Secondary | ICD-10-CM

## 2023-02-19 DIAGNOSIS — N186 End stage renal disease: Secondary | ICD-10-CM

## 2023-02-19 NOTE — Patient Instructions (Signed)
Medication Instructions:  Your physician recommends that you continue on your current medications as directed. Please refer to the Current Medication list given to you today.  *If you need a refill on your cardiac medications before your next appointment, please call your pharmacy*   Lab Work: NONE If you have labs (blood work) drawn today and your tests are completely normal, you will receive your results only by: [place] (if you have MyChart) OR A paper copy in the mail If you have any lab test that is abnormal or we need to change your treatment, we will call you to review the results.   Testing/Procedures: NONE   Follow-Up: At [facility], you and your health needs are our priority.  As part of our continuing mission to provide you with exceptional heart care, we have created designated Provider Care Teams.  These Care Teams include your primary Cardiologist (physician) and Advanced Practice Providers (APPs -  Physician Assistants and Nurse Practitioners) who all work together to provide you with the care you need, when you need it.   Your next appointment:   1 year(s)  Provider:   Jenkins Rouge, MD

## 2023-03-11 ENCOUNTER — Other Ambulatory Visit: Payer: Self-pay | Admitting: *Deleted

## 2023-03-11 DIAGNOSIS — I739 Peripheral vascular disease, unspecified: Secondary | ICD-10-CM

## 2023-03-19 ENCOUNTER — Ambulatory Visit: Payer: 59

## 2023-03-19 ENCOUNTER — Ambulatory Visit (HOSPITAL_COMMUNITY): Payer: 59

## 2023-05-21 ENCOUNTER — Encounter (HOSPITAL_COMMUNITY): Payer: 59

## 2023-05-21 ENCOUNTER — Ambulatory Visit: Payer: 59

## 2023-07-02 ENCOUNTER — Ambulatory Visit (HOSPITAL_COMMUNITY)
Admission: RE | Admit: 2023-07-02 | Discharge: 2023-07-02 | Disposition: A | Discharge status: Home / Self Care | Payer: 59 | Source: Ambulatory Visit | Attending: Physician Assistant | Admitting: Physician Assistant

## 2023-07-02 ENCOUNTER — Ambulatory Visit (INDEPENDENT_AMBULATORY_CARE_PROVIDER_SITE_OTHER): Payer: 59 | Admitting: Physician Assistant

## 2023-07-02 VITALS — BP 144/76 | HR 74 | Temp 97.8°F | Resp 18 | Ht 65.0 in | Wt 149.4 lb

## 2023-07-02 DIAGNOSIS — I739 Peripheral vascular disease, unspecified: Secondary | ICD-10-CM

## 2023-07-02 LAB — VAS US ABI WITH/WO TBI
Left ABI: 0.41
Right ABI: 0.49

## 2023-07-02 MED ORDER — CILOSTAZOL 50 MG PO TABS
50.0000 mg | ORAL_TABLET | Freq: Two times a day (BID) | ORAL | 11 refills | Status: AC
Start: 2023-07-02 — End: ?

## 2023-07-02 NOTE — Progress Notes (Signed)
 [facility] [place] HISTORY AND PHYSICAL   History of Present Illness:  Patient is a 73 y.o. year old female who presents for evaluation of PAD.  She has history of claudication that is lifestyle liming on the right LE.  She has a diagnostic angiogram.  Dr. Chestine Spore had findings of   The right SFA is diminutive proximally with high-grade stenosis proximal to mid segment and then occludes with a long segment chronic total occlusion heavily calcified.  She reconstitutes an above-knee popliteal artery that has a high-grade stenosis.  The below knee popliteal artery is patent.  She only has single-vessel peroneal runoff that is small and diseased.   He suggested a walking program with medical management of Pletal, Eliquis for Afib,  and Statin daily.  She is here today for follow up.  She has co morbidities of ESRD on HD.    She did not pick up the Pletal from her pharmacy and she has not started an exercise program yet.      Past Medical History:  Diagnosis Date   Anemia    Aortic atherosclerosis (HCC)    Arthritis    Atrial fibrillation (HCC)    CAD (coronary artery disease)    COVID 2021   mild   Dialysis patient (HCC)    ESRD on hemodialysis (HCC)    MWF at [facility]   GERD (gastroesophageal reflux disease)    Gout    Heart murmur    Hyperparathyroidism (HCC)    Hypertension    in the past, no longer takes medications   Neuromuscular disorder (HCC)    neuropathy   Pneumonia    Seasonal allergies     Past Surgical History:  Procedure Laterality Date   ABDOMINAL AORTOGRAM W/LOWER EXTREMITY N/A 12/13/2022   Procedure: ABDOMINAL AORTOGRAM W/LOWER EXTREMITY;  Surgeon: [name], MD;  Location: [facility] INVASIVE CV LAB;  Service: Cardiovascular;  Laterality: N/A;   CARDIOVERSION N/A 05/16/2021   Procedure: CARDIOVERSION;  Surgeon: Elease Hashimoto Deloris Ping, MD;  Location: [facility] ENDOSCOPY;  Service: Cardiovascular;  Laterality: N/A;   CARDIOVERSION N/A 08/01/2021    Procedure: CARDIOVERSION;  Surgeon: Thurmon Fair, MD;  Location: [facility] ENDOSCOPY;  Service: Cardiovascular;  Laterality: N/A;   CARDIOVERSION N/A 12/05/2021   Procedure: CARDIOVERSION;  Surgeon: Thomasene Ripple, DO;  Location: [facility] ENDOSCOPY;  Service: Cardiovascular;  Laterality: N/A;   DIALYSIS FISTULA CREATION Left    PERICARDIOCENTESIS N/A 10/02/2022   Procedure: PERICARDIOCENTESIS;  Surgeon: Kathleene Hazel, MD;  Location: [facility] INVASIVE CV LAB;  Service: Cardiovascular;  Laterality: N/A;   REVISON OF ARTERIOVENOUS FISTULA Left 02/16/2022   Procedure: LEFT ARM ARTERIOVENOUS FISTULA REVISION;  Surgeon: Leonie Douglas, MD;  Location: [facility] OR;  Service: Vascular;  Laterality: Left;  PERIPHERAL NERVE BLOCK   TUBAL LIGATION      ROS:   General:  No weight loss, Fever, chills  HEENT: No recent headaches, no nasal bleeding, no visual changes, no sore throat  Neurologic: No dizziness, blackouts, seizures. No recent symptoms of stroke or mini- stroke. No recent episodes of slurred speech, or temporary blindness.  Cardiac: No recent episodes of chest pain/pressure, no shortness of breath at rest.  No shortness of breath with exertion.  Denies history of atrial fibrillation or irregular heartbeat  Vascular: No history of rest pain in feet.  No history of claudication.  No history of non-healing ulcer, No history of DVT   Pulmonary: No home oxygen, no productive cough, no hemoptysis,  No asthma  or wheezing  Musculoskeletal:  [ ]  Arthritis, [ ]  Low back pain,  [ ]  Joint pain  Hematologic:No history of hypercoagulable state.  No history of easy bleeding.  No history of anemia  Gastrointestinal: No hematochezia or melena,  No gastroesophageal reflux, no trouble swallowing  Urinary: [ ]  chronic Kidney disease, [ ]  on HD - [ ]  MWF or [ ]  TTHS, [ ]  Burning with urination, [ ]  Frequent urination, [ ]  Difficulty urinating;   Skin: No rashes  Psychological: No history of anxiety,  No history of  depression  Social History Social History   Tobacco Use   Smoking status: Former    Types: Cigarettes    Start date: 2004    Passive exposure: Never   Smokeless tobacco: Never   Tobacco comments:    Former smoker 04/25/2021  Vaping Use   Vaping status: Never Used  Substance Use Topics   Alcohol use: Yes    Alcohol/week: 1.0 standard drink of alcohol    Types: 1 Cans of beer per week    Comment: 1 beer every other week 06/20/21   Drug use: Never    Family History Family History  Problem Relation Age of Onset   Diabetes Sister    Stomach cancer Neg Hx    Colon cancer Neg Hx     Allergies  Allergies  Allergen Reactions   Penicillin G Hives and Itching   Shrimp (Diagnostic) Hives     Current Outpatient Medications  Medication Sig Dispense Refill   amiodarone (PACERONE) 200 MG tablet TAKE 1 TABLET BY MOUTH DAILY 100 tablet 3   atorvastatin (LIPITOR) 20 MG tablet Take 20 mg by mouth at bedtime.     cilostazol (PLETAL) 50 MG tablet Take 1 tablet (50 mg total) by mouth 2 (two) times daily. 60 tablet 11   diclofenac Sodium (VOLTAREN) 1 % GEL Apply 4 g topically 4 (four) times daily. (Patient not taking: Reported on 12/03/2022) 100 g 0   ELIQUIS 5 MG TABS tablet TAKE 1 TABLET BY MOUTH TWICE  DAILY 200 tablet 2   ferric citrate (AURYXIA) 1 GM 210 MG(Fe) tablet Take 1 tablet (210 mg total) by mouth 3 (three) times daily with meals. 270 tablet 1   multivitamin (RENA-VIT) TABS tablet Take 1 tablet by mouth at bedtime. 100 tablet 0   Current Facility-Administered Medications  Medication Dose Route Frequency Provider Last Rate Last Admin   0.9 %  sodium chloride infusion  250 mL Intravenous PRN [name], MD       sodium chloride flush (NS) 0.9 % injection 3 mL  3 mL Intravenous Q12H [name], MD        Physical Examination  There were no vitals filed for this visit.  There is no height or weight on file to calculate BMI.  General:  Alert and  oriented, no acute distress HEENT: Normal Neck: No bruit or JVD Pulmonary: Clear to auscultation bilaterally Cardiac: Regular Rate and Rhythm without murmur Abdomen: Soft, non-tender, non-distended, no mass, no scars Skin: No rash Extremity Pulses:  radial,  femoral pulses bilaterally Musculoskeletal: No deformity or edema  Neurologic: Upper and lower extremity motor 5/5 and symmetric  DATA:      ABI Findings:  +---------+------------------+-----+----------+--------+  Right   Rt Pressure (mmHg)IndexWaveform  Comment   +---------+------------------+-----+----------+--------+  Brachial 160                                        +---------+------------------+-----+----------+--------+  PTA     64                0.40 monophasic          +---------+------------------+-----+----------+--------+  DP      78                0.49 monophasic          +---------+------------------+-----+----------+--------+  Great Toe0                 0.00 Absent              +---------+------------------+-----+----------+--------+   +---------+------------------+-----+----------+-------+  Left    Lt Pressure (mmHg)IndexWaveform  Comment  +---------+------------------+-----+----------+-------+  PTA     66                0.41 monophasic         +---------+------------------+-----+----------+-------+  DP                             absent             +---------+------------------+-----+----------+-------+  Great Toe0                 0.00 Absent             +---------+------------------+-----+----------+-------+   +-------+-----------+-----------+------------+------------+  ABI/TBIToday's ABIToday's TBIPrevious ABIPrevious TBI  +-------+-----------+-----------+------------+------------+  Right 0.49       Absent     0.50        Absent        +-------+-----------+-----------+------------+------------+  Left  0.41       Absent     0.73         0.36          +-------+-----------+-----------+------------+------------+      Summary:  Right: Resting right ankle-brachial index indicates severe right lower  extremity arterial disease. The right toe-brachial index is  abnormal/absent.   Left: Resting left ankle-brachial index indicates severe left lower  extremity arterial disease. The left toe-brachial index is  abnormal/absent.   Right ABIs appear essentially unchanged compared to prior study on  11/22/2022.  Left ABIs and TBIs appear decreased compared to prior study on 11/22/2022.      ASSESSMENT/PLAN:  PAD with lifestyle limiting claudication. Her ABI's are decreased, she denies non healing wounds or rest pain.   She has a single-vessel diminutive peroneal runoff.    She states she will walk for exercise and pick up her Pletal prescription.  I sent a new prescription to a pharmacy that delivers.  We discussed good foot care and checking her feet daily for injuries.  If she develops wound or rest pain she should call our office.  Her blood flow is unchange on the right and has decreased on the left.  Her symptoms are unchanged.  Her daughter was with her today and will try to encourage her.  F/U in 6 months for repeat studies and exam. She is at high risk of limb loss if she develops non healing wounds or rest pain.       Mosetta Pigeon PA-C [facility] [place] Office: [phone]  MD in clinic [place]

## 2023-07-15 ENCOUNTER — Other Ambulatory Visit: Payer: Self-pay

## 2023-07-15 DIAGNOSIS — I739 Peripheral vascular disease, unspecified: Secondary | ICD-10-CM

## 2023-08-05 ENCOUNTER — Telehealth: Payer: Self-pay

## 2023-08-05 NOTE — Telephone Encounter (Signed)
 Triage:  -pt's sister, Bonita Quin called asking [place] to call her back -returned call to [facility] who stated the pt has a wound on her toe and she won't make it until August for her follow-up appt.  She was unable to tell me which leg, which toe, or if she has been taking her medicine.  Sister attempted to get pt on 3-way but no answer.  Will try back in the am and adjust appt as necessary.

## 2023-08-15 ENCOUNTER — Other Ambulatory Visit: Payer: Self-pay

## 2023-08-15 ENCOUNTER — Ambulatory Visit (INDEPENDENT_AMBULATORY_CARE_PROVIDER_SITE_OTHER): Admitting: Physician Assistant

## 2023-08-15 VITALS — BP 122/76 | HR 77 | Temp 97.9°F | Ht 63.0 in | Wt 151.8 lb

## 2023-08-15 DIAGNOSIS — Z992 Dependence on renal dialysis: Secondary | ICD-10-CM

## 2023-08-15 DIAGNOSIS — I70222 Atherosclerosis of native arteries of extremities with rest pain, left leg: Secondary | ICD-10-CM | POA: Diagnosis not present

## 2023-08-15 DIAGNOSIS — I739 Peripheral vascular disease, unspecified: Secondary | ICD-10-CM

## 2023-08-15 DIAGNOSIS — I998 Other disorder of circulatory system: Secondary | ICD-10-CM

## 2023-08-15 DIAGNOSIS — N186 End stage renal disease: Secondary | ICD-10-CM | POA: Diagnosis not present

## 2023-08-15 NOTE — Progress Notes (Signed)
 Office Note     CC:  follow up Requesting Provider:  Hillery Aldo, NP  HPI: Gloria Gallagher is a 73 y.o. ([birth year]) female who presents as an urgent triage visit to clinic for evaluation of left foot pain.  She was seen by her podiatrist 2 days ago and referred back to our clinic.  Foot pain began roughly a week ago.  It mainly hurts overnight.  Is also painful to walk on.  She denies any open wounds or drainage.  She has known PAD.  She has history of diagnostic angiogram of the right lower extremity due to lifestyle limiting claudication.  She was found to have an occluded right SFA and single-vessel runoff via the peroneal artery.  She however denies any rest pain or significant claudication of the right lower extremity currently.  She tried Pletal but discontinued this medication due to the side effect profile.  She takes a statin daily.  She is a former smoker.   Past Medical History:  Diagnosis Date   Anemia    Aortic atherosclerosis (HCC)    Arthritis    Atrial fibrillation (HCC)    CAD (coronary artery disease)    COVID 2021   mild   Dialysis patient (HCC)    ESRD on hemodialysis (HCC)    MWF at [facility]   GERD (gastroesophageal reflux disease)    Gout    Heart murmur    Hyperparathyroidism (HCC)    Hypertension    in the past, no longer takes medications   Neuromuscular disorder (HCC)    neuropathy   Pneumonia    Seasonal allergies     Past Surgical History:  Procedure Laterality Date   ABDOMINAL AORTOGRAM W/LOWER EXTREMITY N/A 12/13/2022   Procedure: ABDOMINAL AORTOGRAM W/LOWER EXTREMITY;  Surgeon: [name], MD;  Location: [facility] INVASIVE CV LAB;  Service: Cardiovascular;  Laterality: N/A;   CARDIOVERSION N/A 05/16/2021   Procedure: CARDIOVERSION;  Surgeon: Elease Hashimoto Deloris Ping, MD;  Location: [facility] ENDOSCOPY;  Service: Cardiovascular;  Laterality: N/A;   CARDIOVERSION N/A 08/01/2021   Procedure: CARDIOVERSION;  Surgeon: Thurmon Fair, MD;  Location: [facility]  ENDOSCOPY;  Service: Cardiovascular;  Laterality: N/A;   CARDIOVERSION N/A 12/05/2021   Procedure: CARDIOVERSION;  Surgeon: Thomasene Ripple, DO;  Location: [facility] ENDOSCOPY;  Service: Cardiovascular;  Laterality: N/A;   DIALYSIS FISTULA CREATION Left    PERICARDIOCENTESIS N/A 10/02/2022   Procedure: PERICARDIOCENTESIS;  Surgeon: Kathleene Hazel, MD;  Location: [facility] INVASIVE CV LAB;  Service: Cardiovascular;  Laterality: N/A;   REVISON OF ARTERIOVENOUS FISTULA Left 02/16/2022   Procedure: LEFT ARM ARTERIOVENOUS FISTULA REVISION;  Surgeon: Leonie Douglas, MD;  Location: [facility] OR;  Service: Vascular;  Laterality: Left;  PERIPHERAL NERVE BLOCK   TUBAL LIGATION      Social History   Socioeconomic History   Marital status: Single    Spouse name: Not on file   Number of children: 1   Years of education: Not on file   Highest education level: Not on file  Occupational History   Occupation: retired  Tobacco Use   Smoking status: Former    Types: Cigarettes    Start date: 2004    Passive exposure: Never   Smokeless tobacco: Never   Tobacco comments:    Former smoker 04/25/2021  Vaping Use   Vaping status: Never Used  Substance and Sexual Activity   Alcohol use: Yes    Alcohol/week: 1.0 standard drink of alcohol    Types: 1 Cans of beer  per week    Comment: 1 beer every other week 06/20/21   Drug use: Never   Sexual activity: Not Currently  Other Topics Concern   Not on file  Social History Narrative   Not on file   Social Drivers of Health   Financial Resource Strain: Not on file  Food Insecurity: No Food Insecurity (01/18/2022)   Hunger Vital Sign    Worried About Running Out of Food in the Last Year: Never true    Ran Out of Food in the Last Year: Never true  Transportation Needs: No Transportation Needs (01/18/2022)   PRAPARE - Administrator, Civil Service (Medical): No    Lack of Transportation (Non-Medical): No  Physical Activity: Not on file  Stress: Not on file   Social Connections: Not on file  Intimate Partner Violence: Not At Risk (01/18/2022)   Humiliation, Afraid, Rape, and Kick questionnaire    Fear of Current or Ex-Partner: No    Emotionally Abused: No    Physically Abused: No    Sexually Abused: No    Family History  Problem Relation Age of Onset   Diabetes Sister    Stomach cancer Neg Hx    Colon cancer Neg Hx     Current Outpatient Medications  Medication Sig Dispense Refill   acetaminophen (ACETAMINOPHEN 8 HOUR) 650 MG CR tablet Take by mouth.     atorvastatin (LIPITOR) 20 MG tablet Take 20 mg by mouth at bedtime.     cilostazol (PLETAL) 50 MG tablet Take 1 tablet (50 mg total) by mouth 2 (two) times daily. 60 tablet 11   diclofenac Sodium (VOLTAREN) 1 % GEL Apply 4 g topically 4 (four) times daily. 100 g 0   ELIQUIS 5 MG TABS tablet TAKE 1 TABLET BY MOUTH TWICE  DAILY 200 tablet 2   ferric citrate (AURYXIA) 1 GM 210 MG(Fe) tablet Take 1 tablet (210 mg total) by mouth 3 (three) times daily with meals. 270 tablet 1   multivitamin (RENA-VIT) TABS tablet Take 1 tablet by mouth at bedtime. 100 tablet 0   amiodarone (PACERONE) 200 MG tablet TAKE 1 TABLET BY MOUTH DAILY (Patient not taking: Reported on 08/15/2023) 100 tablet 3   Current Facility-Administered Medications  Medication Dose Route Frequency Provider Last Rate Last Admin   0.9 %  sodium chloride infusion  250 mL Intravenous PRN [name], MD       sodium chloride flush (NS) 0.9 % injection 3 mL  3 mL Intravenous Q12H [name], MD        Allergies  Allergen Reactions   Penicillin G Hives and Itching   Shrimp (Diagnostic) Hives     REVIEW OF SYSTEMS:   [X]  denotes positive finding, [ ]  denotes negative finding Cardiac  Comments:  Chest pain or chest pressure:    Shortness of breath upon exertion:    Short of breath when lying flat:    Irregular heart rhythm:        Vascular    Pain in calf, thigh, or hip brought on by ambulation:    Pain in  feet at night that wakes you up from your sleep:     Blood clot in your veins:    Leg swelling:         Pulmonary    Oxygen at home:    Productive cough:     Wheezing:         Neurologic    Sudden weakness in  arms or legs:     Sudden numbness in arms or legs:     Sudden onset of difficulty speaking or slurred speech:    Temporary loss of vision in one eye:     Problems with dizziness:         Gastrointestinal    Blood in stool:     Vomited blood:         Genitourinary    Burning when urinating:     Blood in urine:        Psychiatric    Major depression:         Hematologic    Bleeding problems:    Problems with blood clotting too easily:        Skin    Rashes or ulcers:        Constitutional    Fever or chills:      PHYSICAL EXAMINATION:  Vitals:   08/15/23 1259  BP: 122/76  Pulse: 77  Temp: 97.9 F (36.6 C)  TempSrc: Temporal  SpO2: 92%  Weight: 151 lb 12.8 oz (68.9 kg)  Height: 5\' 3"  (1.6 m)    General:  WDWN in NAD; vital signs documented above Gait: Not observed HENT: WNL, normocephalic Pulmonary: normal non-labored breathing , without Rales, rhonchi,  wheezing Cardiac: regular HR Abdomen: soft, NT, no masses Skin: without rashes Vascular Exam/Pulses: She has a palpable right femoral pulse she has absent pedal pulses Extremities: She has a brisk left PT by Doppler; motor and sensation is intact both feet; left foot erythema Musculoskeletal: no muscle wasting or atrophy  Neurologic: A&O X 3 Psychiatric:  The pt has Normal affect.   Non-Invasive Vascular Imaging:    ABI/TBIToday's ABIToday's TBIPrevious ABIPrevious TBI  +-------+-----------+-----------+------------+------------+  Right 0.49       Absent     0.50        Absent        +-------+-----------+-----------+------------+------------+  Left  0.41       Absent     0.73        0.36             ASSESSMENT/PLAN:: 73 y.o. female who returns to clinic as an urgent triage  patient due to left foot rest pain over the past week  Gloria Gallagher is a 73 year old female with known PAD.  She had a diagnostic angiogram of the right lower extremity in August of last year demonstrating right SFA occlusion and single-vessel runoff via the peroneal artery.  She is now experiencing rest pain in the left leg which has not been studied by angiogram.  On exam her left foot is also erythematous.  She has a brisk left PT signal by Doppler and motor and sensation is intact.  She is on a daily statin.  Plan will be to proceed with aortogram with focus on the left lower extremity with possible femoral-popliteal angioplasty and stenting, and tibial angioplasty.  This was performed by Dr. Chestine Spore next week.  This was discussed in detail with the patient and her daughter and they are agreeable to proceed.  She was scheduled by Caprice Beaver in the office today.  She will hold her Eliquis at least 48 hours prior to her procedure.   Emilie Rutter, PA-C [facility] [phone]  Clinic MD:   Karin Lieu

## 2023-08-22 ENCOUNTER — Other Ambulatory Visit: Payer: Self-pay

## 2023-08-22 ENCOUNTER — Encounter (HOSPITAL_COMMUNITY)
Admission: RE | Disposition: A | Discharge status: Home / Self Care | Payer: Self-pay | Source: Home / Self Care | Attending: Vascular Surgery

## 2023-08-22 ENCOUNTER — Ambulatory Visit (HOSPITAL_COMMUNITY)
Admission: RE | Admit: 2023-08-22 | Discharge: 2023-08-22 | Disposition: A | Discharge status: Home / Self Care | Attending: Vascular Surgery | Admitting: Vascular Surgery

## 2023-08-22 DIAGNOSIS — I70222 Atherosclerosis of native arteries of extremities with rest pain, left leg: Secondary | ICD-10-CM | POA: Diagnosis present

## 2023-08-22 DIAGNOSIS — Z79899 Other long term (current) drug therapy: Secondary | ICD-10-CM | POA: Diagnosis not present

## 2023-08-22 DIAGNOSIS — I120 Hypertensive chronic kidney disease with stage 5 chronic kidney disease or end stage renal disease: Secondary | ICD-10-CM | POA: Insufficient documentation

## 2023-08-22 DIAGNOSIS — I70212 Atherosclerosis of native arteries of extremities with intermittent claudication, left leg: Secondary | ICD-10-CM

## 2023-08-22 DIAGNOSIS — N186 End stage renal disease: Secondary | ICD-10-CM | POA: Insufficient documentation

## 2023-08-22 DIAGNOSIS — I998 Other disorder of circulatory system: Secondary | ICD-10-CM

## 2023-08-22 DIAGNOSIS — Z992 Dependence on renal dialysis: Secondary | ICD-10-CM | POA: Diagnosis not present

## 2023-08-22 DIAGNOSIS — Z87891 Personal history of nicotine dependence: Secondary | ICD-10-CM | POA: Diagnosis not present

## 2023-08-22 DIAGNOSIS — Z7901 Long term (current) use of anticoagulants: Secondary | ICD-10-CM | POA: Diagnosis not present

## 2023-08-22 LAB — GLUCOSE, CAPILLARY: Glucose-Capillary: 94 mg/dL (ref 70–99)

## 2023-08-22 LAB — POCT ACTIVATED CLOTTING TIME
Activated Clotting Time: 187 s
Activated Clotting Time: 176 s

## 2023-08-22 LAB — POCT I-STAT, CHEM 8
BUN: 65 mg/dL — ABNORMAL HIGH (ref 8–23)
Calcium, Ion: 1.21 mmol/L (ref 1.15–1.40)
Chloride: 94 mmol/L — ABNORMAL LOW (ref 98–111)
Creatinine, Ser: 7.9 mg/dL — ABNORMAL HIGH (ref 0.44–1.00)
Glucose, Bld: 88 mg/dL (ref 70–99)
HCT: 40 % (ref 36.0–46.0)
Hemoglobin: 13.6 g/dL (ref 12.0–15.0)
Potassium: 5.6 mmol/L — ABNORMAL HIGH (ref 3.5–5.1)
Sodium: 136 mmol/L (ref 135–145)
TCO2: 37 mmol/L — ABNORMAL HIGH (ref 22–32)

## 2023-08-22 SURGERY — ABDOMINAL AORTOGRAM
Anesthesia: LOCAL

## 2023-08-22 MED ORDER — ONDANSETRON HCL 4 MG/2ML IJ SOLN: 4.0000 mg | Freq: Four times a day (QID) | INTRAMUSCULAR | Status: DC | PRN

## 2023-08-22 MED ORDER — IODIXANOL 320 MG/ML IV SOLN
INTRAVENOUS | Status: DC | PRN
  Administered 2023-08-22: 125 mL via INTRA_ARTERIAL

## 2023-08-22 MED ORDER — ACETAMINOPHEN 325 MG PO TABS: 650.0000 mg | ORAL_TABLET | ORAL | Status: DC | PRN

## 2023-08-22 MED ORDER — FENTANYL CITRATE (PF) 100 MCG/2ML IJ SOLN
INTRAMUSCULAR | Status: AC
  Filled 2023-08-22: qty 2

## 2023-08-22 MED ORDER — HEPARIN SODIUM (PORCINE) 1000 UNIT/ML IJ SOLN
INTRAMUSCULAR | Status: AC
  Filled 2023-08-22: qty 10

## 2023-08-22 MED ORDER — CLOPIDOGREL BISULFATE 75 MG PO TABS: 75.0000 mg | ORAL_TABLET | Freq: Every day | ORAL | Status: DC

## 2023-08-22 MED ORDER — SODIUM CHLORIDE 0.9 % IV SOLN: INTRAVENOUS | Status: DC

## 2023-08-22 MED ORDER — CLOPIDOGREL BISULFATE 75 MG PO TABS: 75.0000 mg | ORAL_TABLET | Freq: Every day | ORAL | 11 refills | Status: DC

## 2023-08-22 MED ORDER — SODIUM CHLORIDE 0.9% FLUSH: 3.0000 mL | INTRAVENOUS | Status: DC | PRN

## 2023-08-22 MED ORDER — SODIUM CHLORIDE 0.9% FLUSH: 3.0000 mL | Freq: Two times a day (BID) | INTRAVENOUS | Status: DC

## 2023-08-22 MED ORDER — HYDRALAZINE HCL 20 MG/ML IJ SOLN: 5.0000 mg | INTRAMUSCULAR | Status: DC | PRN

## 2023-08-22 MED ORDER — CLOPIDOGREL BISULFATE 75 MG PO TABS: 300.0000 mg | ORAL_TABLET | Freq: Once | ORAL | Status: DC

## 2023-08-22 MED ORDER — MIDAZOLAM HCL 2 MG/2ML IJ SOLN
INTRAMUSCULAR | Status: AC
  Filled 2023-08-22: qty 2

## 2023-08-22 MED ORDER — LABETALOL HCL 5 MG/ML IV SOLN: 10.0000 mg | INTRAVENOUS | Status: DC | PRN

## 2023-08-22 MED ORDER — CLOPIDOGREL BISULFATE 300 MG PO TABS
ORAL_TABLET | ORAL | Status: AC
  Filled 2023-08-22: qty 1

## 2023-08-22 MED ORDER — LIDOCAINE HCL (PF) 1 % IJ SOLN
INTRAMUSCULAR | Status: AC
  Filled 2023-08-22: qty 30

## 2023-08-22 MED ORDER — OXYCODONE HCL 5 MG PO TABS: 5.0000 mg | ORAL_TABLET | ORAL | Status: DC | PRN

## 2023-08-22 MED ORDER — FENTANYL CITRATE (PF) 100 MCG/2ML IJ SOLN
INTRAMUSCULAR | Status: DC | PRN
  Administered 2023-08-22: 25 ug via INTRAVENOUS

## 2023-08-22 MED ORDER — SODIUM CHLORIDE 0.9 % IV SOLN: 250.0000 mL | INTRAVENOUS | Status: DC | PRN

## 2023-08-22 MED ORDER — MIDAZOLAM HCL 2 MG/2ML IJ SOLN
INTRAMUSCULAR | Status: DC | PRN
  Administered 2023-08-22: 1 mg via INTRAVENOUS

## 2023-08-22 MED ORDER — HEPARIN (PORCINE) IN NACL 1000-0.9 UT/500ML-% IV SOLN
INTRAVENOUS | Status: DC | PRN
  Administered 2023-08-22: 1000 mL via SURGICAL_CAVITY

## 2023-08-22 MED ORDER — CLOPIDOGREL BISULFATE 300 MG PO TABS
ORAL_TABLET | ORAL | Status: DC | PRN
  Administered 2023-08-22: 300 mg via ORAL

## 2023-08-22 MED ORDER — LIDOCAINE HCL (PF) 1 % IJ SOLN
INTRAMUSCULAR | Status: DC | PRN
  Administered 2023-08-22: 10 mL

## 2023-08-22 MED ORDER — HEPARIN SODIUM (PORCINE) 1000 UNIT/ML IJ SOLN
INTRAMUSCULAR | Status: DC | PRN
  Administered 2023-08-22: 6000 [IU] via INTRAVENOUS

## 2023-08-22 SURGICAL SUPPLY — 18 items
CATH ANGIO 5F BER2 65CM (CATHETERS) IMPLANT
CATH OMNI FLUSH 5F 65CM (CATHETERS) IMPLANT
CATH SHOCKWAVE M5 4.5X60 (CATHETERS) IMPLANT
COVER DOME SNAP 22 D (MISCELLANEOUS) IMPLANT
GLIDEWIRE ADV .035X180CM (WIRE) IMPLANT
GUIDEWIRE ANGLED .035X150CM (WIRE) IMPLANT
KIT ENCORE 26 ADVANTAGE (KITS) IMPLANT
KIT MICROPUNCTURE NIT STIFF (SHEATH) IMPLANT
SET ATX-X65L (MISCELLANEOUS) IMPLANT
SHEATH CATAPULT 6F 45 MP (SHEATH) IMPLANT
SHEATH PINNACLE 5F 10CM (SHEATH) IMPLANT
SHEATH PINNACLE 6F 10CM (SHEATH) IMPLANT
SHEATH PROBE COVER 6X72 (BAG) IMPLANT
TRAY PV CATH (CUSTOM PROCEDURE TRAY) ×2 IMPLANT
WIRE BENTSON .035X145CM (WIRE) IMPLANT
WIRE ROSEN-J .035X180CM (WIRE) IMPLANT
WIRE SPARTACORE .014X300CM (WIRE) IMPLANT
WIRE TORQFLEX AUST .018X40CM (WIRE) IMPLANT

## 2023-08-22 NOTE — Op Note (Signed)
 Patient name: Gloria Gallagher MRN: [id number] DOB: Apr 17, 1951 Sex: female  08/22/2023 Pre-operative Diagnosis: Critical limb ischemia of the left lower extremity with rest pain Post-operative diagnosis:  Same Surgeon:  [name], MD Procedure Performed: 1.  Ultrasound-guided access right common femoral artery 2.  Aortogram with catheter selection of aorta 3.  Left lower extremity arteriogram with catheter selection of the left common femoral artery 4.  Shockwave lithotripsy of the left external iliac artery (4.5 mm x 60 mm shockwave lithotripsy balloon x 300 pulses) 5.  64 minutes of monitored moderate conscious sedation time  Indications: Patient is a 73 year old female seen last week for a triage visit with left foot rest pain.  ABIs on the left are 0.4.  She presents for lower extreme arteriogram with possible invention with a focus in the left leg after risks and benefits discussed.  Findings:   Ultrasound-guided access of right common femoral artery.  Aortogram showed patent infrarenal aorta.  Both renal arteries were atretic given end-stage renal disease.  Both iliacs were tortuous and calcified.  On the left her common iliac was patent without flow-limiting stenosis.  The distal left external iliac artery had a high-grade 75% focal calcified stenosis.  Distally she had a patent common femoral and profunda.  There was an atretic proximal SFA and then a long segment chronic total occlusion and reconstitution of the above-knee pop.  The distal popliteal artery is small measuring only about 2 to 3 mm.  Two-vessel runoff in the peroneal and posterior tibial.  I did not think she had any endovascular option for her left SFA occlusion as this artery was very small measuring less than 3 mm distally where it reconstituted.  Ultimately elected to treat her left external iliac artery high grade stenosis with shockwave lithotripsy using a 4.5 mm x 60 mm shockwave balloon x 300 pulses.   Less than 30% residual stenosis.  More brisk flow down the leg with preserved runoff through profunda collaterals.   Procedure:  The patient was identified in the holding area and taken to room 8.  The patient was then placed supine on the table and prepped and draped in the usual sterile fashion.  A time out was called.  The patient received Versed and fentanyl for conscious moderate sedation.  Vital signs were monitored including heart rate, respiratory rate, oxygenation and blood pressure.  I was present for all of moderate sedation.  Ultrasound was used to evaluate the right common femoral artery.  It was patent .  A digital ultrasound image was acquired.  A micropuncture needle was used to access the right common femoral artery under ultrasound guidance.  An 018 wire was advanced without resistance and a micropuncture sheath was placed.  The 018 wire was removed and a benson wire was placed.  The micropuncture sheath was exchanged for a 5 french sheath.  An omniflush catheter was advanced over the wire to the level of L-1.  An abdominal angiogram was obtained.  He had a very steep aortic bifurcation and had to use a BER 2 catheter with a glidewire to get a catheter down through the left iliac on the left side into the left external iliac.  Left leg arteriogram was then obtained with runoff.  Pertinent findings are noted above.  Ultimately I did not think she had an endovascular option for her left SFA occlusion given this was a very small vessel distally less than 3 mm when it reconstituted.  Ultimately I  elected to treat her left external iliac high-grade stenosis with shockwave lithotripsy.  I then used a Glidewire advantage and upsized to a 6 [place] catapult sheath in the right groin over the aortic bifurcation into the left common iliac.  I exchanged for a [place] core wire placed down the left profunda.  The patient was given 100 units/kg IV heparin.  I then treated the left external iliac focal stenosis  just proximal to the common femoral with a 4.5 mm shockwave balloon inflated to 4 atm x 300 pulses.  Much better flow with no significant residual stenosis.  Wires and catheters were removed.  A short 6 French sheath was placed in the right groin.  Not a candidate for closure.  Taken to holding in stable condition.  Plan: Excellent results after shockwave lithotripsy of high-grade left external iliac stenosis.  If the rest pain does not improve would need left common femoral to below-knee popliteal bypass.  Does not have a good endovascular option for inguinal disease on the left.  [name], MD [facility] Office: [phone]

## 2023-08-22 NOTE — H&P (Signed)
 History and Physical Interval Note:  08/22/2023 2:12 PM  Gloria Gallagher  has presented today for surgery, with the diagnosis of ischemia.  The various methods of treatment have been discussed with the patient and family. After consideration of risks, benefits and other options for treatment, the patient has consented to  Procedure(s): ABDOMINAL AORTOGRAM (N/A) Lower Extremity Angiography (N/A) LOWER EXTREMITY INTERVENTION (N/A) as a surgical intervention.  The patient's history has been reviewed, patient examined, no change in status, stable for surgery.  I have reviewed the patient's chart and labs.  Questions were answered to the patient's satisfaction.     [name]   Office Note        CC:  follow up Requesting Provider:  Hillery Aldo, NP   HPI: Gloria Gallagher is a 73 y.o. (Oct 10, 1950) female who presents as an urgent triage visit to clinic for evaluation of left foot pain.  She was seen by her podiatrist 2 days ago and referred back to our clinic.  Foot pain began roughly a week ago.  It mainly hurts overnight.  Is also painful to walk on.  She denies any open wounds or drainage.  She has known PAD.  She has history of diagnostic angiogram of the right lower extremity due to lifestyle limiting claudication.  She was found to have an occluded right SFA and single-vessel runoff via the peroneal artery.  She however denies any rest pain or significant claudication of the right lower extremity currently.  She tried Pletal but discontinued this medication due to the side effect profile.  She takes a statin daily.  She is a former smoker.         Past Medical History:  Diagnosis Date   Anemia     Aortic atherosclerosis (HCC)     Arthritis     Atrial fibrillation (HCC)     CAD (coronary artery disease)     COVID 2021    mild   Dialysis patient (HCC)     ESRD on hemodialysis (HCC)      MWF at [facility]   GERD (gastroesophageal reflux disease)     Gout     Heart  murmur     Hyperparathyroidism (HCC)     Hypertension      in the past, no longer takes medications   Neuromuscular disorder (HCC)      neuropathy   Pneumonia     Seasonal allergies                 Past Surgical History:  Procedure Laterality Date   ABDOMINAL AORTOGRAM W/LOWER EXTREMITY N/A 12/13/2022    Procedure: ABDOMINAL AORTOGRAM W/LOWER EXTREMITY;  Surgeon: [name], MD;  Location: [facility] INVASIVE CV LAB;  Service: Cardiovascular;  Laterality: N/A;   CARDIOVERSION N/A 05/16/2021    Procedure: CARDIOVERSION;  Surgeon: Elease Hashimoto Deloris Ping, MD;  Location: [facility] ENDOSCOPY;  Service: Cardiovascular;  Laterality: N/A;   CARDIOVERSION N/A 08/01/2021    Procedure: CARDIOVERSION;  Surgeon: Thurmon Fair, MD;  Location: [facility] ENDOSCOPY;  Service: Cardiovascular;  Laterality: N/A;   CARDIOVERSION N/A 12/05/2021    Procedure: CARDIOVERSION;  Surgeon: Thomasene Ripple, DO;  Location: [facility] ENDOSCOPY;  Service: Cardiovascular;  Laterality: N/A;   DIALYSIS FISTULA CREATION Left     PERICARDIOCENTESIS N/A 10/02/2022    Procedure: PERICARDIOCENTESIS;  Surgeon: Kathleene Hazel, MD;  Location: [facility] INVASIVE CV LAB;  Service: Cardiovascular;  Laterality: N/A;   REVISON OF ARTERIOVENOUS FISTULA Left 02/16/2022  Procedure: LEFT ARM ARTERIOVENOUS FISTULA REVISION;  Surgeon: Leonie Douglas, MD;  Location: [facility] OR;  Service: Vascular;  Laterality: Left;  PERIPHERAL NERVE BLOCK   TUBAL LIGATION              Social History         Socioeconomic History   Marital status: Single      Spouse name: Not on file   Number of children: 1   Years of education: Not on file   Highest education level: Not on file  Occupational History   Occupation: retired  Tobacco Use   Smoking status: Former      Types: Cigarettes      Start date: 2004      Passive exposure: Never   Smokeless tobacco: Never   Tobacco comments:      Former smoker 04/25/2021  Vaping Use   Vaping status: Never Used  Substance and  Sexual Activity   Alcohol use: Yes      Alcohol/week: 1.0 standard drink of alcohol      Types: 1 Cans of beer per week      Comment: 1 beer every other week 06/20/21   Drug use: Never   Sexual activity: Not Currently  Other Topics Concern   Not on file  Social History Narrative   Not on file    Social Drivers of Health        Financial Resource Strain: Not on file  Food Insecurity: No Food Insecurity (01/18/2022)    Hunger Vital Sign     Worried About Running Out of Food in the Last Year: Never true     Ran Out of Food in the Last Year: Never true  Transportation Needs: No Transportation Needs (01/18/2022)    PRAPARE - Therapist, art (Medical): No     Lack of Transportation (Non-Medical): No  Physical Activity: Not on file  Stress: Not on file  Social Connections: Not on file  Intimate Partner Violence: Not At Risk (01/18/2022)    Humiliation, Afraid, Rape, and Kick questionnaire     Fear of Current or Ex-Partner: No     Emotionally Abused: No     Physically Abused: No     Sexually Abused: No           Family History  Problem Relation Age of Onset   Diabetes Sister     Stomach cancer Neg Hx     Colon cancer Neg Hx                  Current Outpatient Medications  Medication Sig Dispense Refill   acetaminophen (ACETAMINOPHEN 8 HOUR) 650 MG CR tablet Take by mouth.       atorvastatin (LIPITOR) 20 MG tablet Take 20 mg by mouth at bedtime.       cilostazol (PLETAL) 50 MG tablet Take 1 tablet (50 mg total) by mouth 2 (two) times daily. 60 tablet 11   diclofenac Sodium (VOLTAREN) 1 % GEL Apply 4 g topically 4 (four) times daily. 100 g 0   ELIQUIS 5 MG TABS tablet TAKE 1 TABLET BY MOUTH TWICE  DAILY 200 tablet 2   ferric citrate (AURYXIA) 1 GM 210 MG(Fe) tablet Take 1 tablet (210 mg total) by mouth 3 (three) times daily with meals. 270 tablet 1   multivitamin (RENA-VIT) TABS tablet Take 1 tablet by mouth at bedtime. 100 tablet 0   amiodarone  (PACERONE) 200 MG tablet TAKE 1  TABLET BY MOUTH DAILY (Patient not taking: Reported on 08/15/2023) 100 tablet 3               Current Facility-Administered Medications  Medication Dose Route Frequency Provider Last Rate Last Admin   0.9 %  sodium chloride infusion  250 mL Intravenous PRN [name], MD       sodium chloride flush (NS) 0.9 % injection 3 mL  3 mL Intravenous Q12H [name], MD            Allergies      Allergies  Allergen Reactions   Penicillin G Hives and Itching   Shrimp (Diagnostic) Hives          REVIEW OF SYSTEMS:    [X]  denotes positive finding, [ ]  denotes negative finding Cardiac   Comments:  Chest pain or chest pressure:      Shortness of breath upon exertion:      Short of breath when lying flat:      Irregular heart rhythm:             Vascular      Pain in calf, thigh, or hip brought on by ambulation:      Pain in feet at night that wakes you up from your sleep:       Blood clot in your veins:      Leg swelling:              Pulmonary      Oxygen at home:      Productive cough:       Wheezing:              Neurologic      Sudden weakness in arms or legs:       Sudden numbness in arms or legs:       Sudden onset of difficulty speaking or slurred speech:      Temporary loss of vision in one eye:       Problems with dizziness:              Gastrointestinal      Blood in stool:       Vomited blood:              Genitourinary      Burning when urinating:       Blood in urine:             Psychiatric      Major depression:              Hematologic      Bleeding problems:      Problems with blood clotting too easily:             Skin      Rashes or ulcers:             Constitutional      Fever or chills:          PHYSICAL EXAMINATION:      Vitals:    08/15/23 1259  BP: 122/76  Pulse: 77  Temp: 97.9 F (36.6 C)  TempSrc: Temporal  SpO2: 92%  Weight: 151 lb 12.8 oz (68.9 kg)  Height: 5\' 3"  (1.6 m)       General:  WDWN in NAD; vital signs documented above Gait: Not observed HENT: WNL, normocephalic Pulmonary: normal non-labored breathing , without Rales, rhonchi,  wheezing Cardiac: regular HR Abdomen: soft, NT, no masses Skin: without rashes Vascular  Exam/Pulses: She has a palpable right femoral pulse she has absent pedal pulses Extremities: She has a brisk left PT by Doppler; motor and sensation is intact both feet; left foot erythema Musculoskeletal: no muscle wasting or atrophy       Neurologic: A&O X 3 Psychiatric:  The pt has Normal affect.     Non-Invasive Vascular Imaging:     ABI/TBIToday's ABIToday's TBIPrevious ABIPrevious TBI  +-------+-----------+-----------+------------+------------+  Right 0.49       Absent     0.50        Absent        +-------+-----------+-----------+------------+------------+  Left  0.41       Absent     0.73        0.36                ASSESSMENT/PLAN:: 73 y.o. female who returns to clinic as an urgent triage patient due to left foot rest pain over the past week   Gloria Gallagher is a 73 year old female with known PAD.  She had a diagnostic angiogram of the right lower extremity in August of last year demonstrating right SFA occlusion and single-vessel runoff via the peroneal artery.  She is now experiencing rest pain in the left leg which has not been studied by angiogram.  On exam her left foot is also erythematous.  She has a brisk left PT signal by Doppler and motor and sensation is intact.  She is on a daily statin.  Plan will be to proceed with aortogram with focus on the left lower extremity with possible femoral-popliteal angioplasty and stenting, and tibial angioplasty.  This was performed by Dr. Chestine Spore next week.  This was discussed in detail with the patient and her daughter and they are agreeable to proceed.  She was scheduled by Caprice Beaver in the office today.  She will hold her Eliquis at least 48 hours prior to her  procedure.     Emilie Rutter, PA-C [facility] [phone]   Clinic MD:   Karin Lieu

## 2023-08-22 NOTE — Progress Notes (Signed)
 Dr. Chestine Spore ok with bedrest ending at 2100, see note regarding sheath removal, Dr. Chestine Spore states pt has had swelling to feet/ankles, and abdominal breathing, pt is HD pt, safety maintained, See flowsheets for VS

## 2023-08-22 NOTE — Progress Notes (Addendum)
 SITE AREA: right groin/femoral  SITE PRIOR TO REMOVAL:  LEVEL 0  PRESSURE APPLIED FOR: approximately 35 minutes  MANUAL: yes  PATIENT STATUS DURING PULL: stable  POST PULL SITE:  LEVEL 0  POST PULL INSTRUCTIONS GIVEN: yes, drsg x 24 hours, may shower tomorrow, no sitting in water, no hot tubs, bath tubs, or pools x 1 week, take it easy on stairs and climbing into car, if bleeding at home occurs, call 911  POST PULL PULSES PRESENT: bilateral pedal pulses dopplerable  DRESSING APPLIED: gauze with tegaderm   BEDREST BEGINS @ 1725  COMMENTS: Dr Chestine Spore to bedside at about 1710, RN informed Dr. Chestine Spore of small, soft, protrusion to right lower abdomen, mild tenderness noted on deep palpitation, pt states it has been there, Dr. Chestine Spore examined area, no new orders obtained, asked nurse to hold pressure for another 10 minutes, ending hold time at approximately 1725, see next note

## 2023-08-22 NOTE — Discharge Instructions (Signed)
 Femoral Site Care This sheet gives you information about how to care for yourself after your procedure. Your health care provider may also give you more specific instructions. If you have problems or questions, contact your health care provider. What can I expect after the procedure?  After the procedure, it is common to have: Bruising that usually fades within 1-2 weeks. Tenderness at the site. Follow these instructions at home: Wound care Follow instructions from your health care provider about how to take care of your insertion site. Make sure you: Wash your hands with soap and water before you change your bandage (dressing). If soap and water are not available, use hand sanitizer. Remove your dressing as told by your health care provider. In 24 hours Do not take baths, swim, or use a hot tub until your health care provider approves. For 5 days You may shower 24 hours after the procedure or as told by your health care provider. Gently wash the site with plain soap and water. Pat the area dry with a clean towel. Do not rub the site. This may cause bleeding. Do not apply powder or lotion to the site. Keep the site clean and dry. Check your femoral site every day for signs of infection. Check for: Redness, swelling, or pain. Fluid or blood. Warmth. Pus or a bad smell. Activity For the first 2-3 days after your procedure, or as long as directed: Avoid climbing stairs as much as possible. Do not squat. Do not lift anything that is heavier than 10 lb (4.5 kg), or the limit that you are told, until your health care provider says that it is safe. For 5 days Rest as directed. Avoid sitting for a long time without moving. Get up to take short walks every 1-2 hours. Do not drive for 24 hours if you were given a medicine to help you relax (sedative). General instructions Take over-the-counter and prescription medicines only as told by your health care provider. Keep all follow-up visits as  told by your health care provider. This is important. Contact a health care provider if you have: A fever or chills. You have redness, swelling, or pain around your insertion site. Get help right away if: The catheter insertion area swells very fast. You pass out. You suddenly start to sweat or your skin gets clammy. The catheter insertion area is bleeding, and the bleeding does not stop when you hold steady pressure on the area. The area near or just beyond the catheter insertion site becomes pale, cool, tingly, or numb. These symptoms may represent a serious problem that is an emergency. Do not wait to see if the symptoms will go away. Get medical help right away. Call your local emergency services (911 in the [place] Do not drive yourself to the hospital. Summary After the procedure, it is common to have bruising that usually fades within 1-2 weeks. Check your femoral site every day for signs of infection. Do not lift anything that is heavier than 10 lb (4.5 kg), or the limit that you are told, until your health care provider says that it is safe. This information is not intended to replace advice given to you by your health care provider. Make sure you discuss any questions you have with your health care provider. Document Revised: 05/13/2017 Document Reviewed: 05/13/2017 Elsevier Patient Education  2020 [employer].

## 2023-08-23 ENCOUNTER — Encounter (HOSPITAL_COMMUNITY): Payer: Self-pay | Admitting: Vascular Surgery

## 2023-09-04 ENCOUNTER — Other Ambulatory Visit (HOSPITAL_COMMUNITY): Payer: Self-pay | Admitting: Cardiovascular Disease

## 2023-09-04 DIAGNOSIS — I4819 Other persistent atrial fibrillation: Secondary | ICD-10-CM

## 2023-09-05 NOTE — Telephone Encounter (Addendum)
 Prescription refill request for Eliquis  received.  Indication: Afib  Last office visit: N[place], 02/19/2023 Scr: 6.42, 10/25/2022 Age: 73 yo  Weight: 65.8 kg    Refill sent.

## 2023-09-11 ENCOUNTER — Telehealth: Payer: Self-pay

## 2023-09-11 NOTE — Telephone Encounter (Signed)
 Advice Only: -pt's sister Stana Ear called stating pt is having a lot of pain in her foot/toes, that the doctor needs to fix that and wants something to help the pain.  She also states she is seeing the [facility] doctor and she is losing toe nails.   -pt admits her R leg/foot is better and her L is worse, that she is worried about the toe nails and continued pain. She denies any swelling and her foot is warm.  She requested we call the foot doctor and tell them.   -called [facility] and they updated her chart to reflect recent vascular procedure and current tx plan.  -pt had expected her pain to resolve and discussed even though she had the vascular procedure  -pt recommended to visit PCP to discuss tx options chronic lower limb pain.

## 2023-09-19 ENCOUNTER — Encounter (HOSPITAL_COMMUNITY): Payer: Self-pay

## 2023-09-19 ENCOUNTER — Other Ambulatory Visit: Payer: Self-pay

## 2023-09-19 ENCOUNTER — Emergency Department (HOSPITAL_COMMUNITY)

## 2023-09-19 ENCOUNTER — Encounter (HOSPITAL_COMMUNITY)

## 2023-09-19 ENCOUNTER — Emergency Department (HOSPITAL_COMMUNITY)
Admission: EM | Admit: 2023-09-19 | Discharge: 2023-09-20 | Disposition: A | Discharge status: Home / Self Care | Attending: Emergency Medicine | Admitting: Emergency Medicine

## 2023-09-19 DIAGNOSIS — Z7901 Long term (current) use of anticoagulants: Secondary | ICD-10-CM

## 2023-09-19 DIAGNOSIS — Q612 Polycystic kidney, adult type: Secondary | ICD-10-CM

## 2023-09-19 DIAGNOSIS — D631 Anemia in chronic kidney disease: Secondary | ICD-10-CM | POA: Diagnosis present

## 2023-09-19 DIAGNOSIS — L97529 Non-pressure chronic ulcer of other part of left foot with unspecified severity: Secondary | ICD-10-CM | POA: Insufficient documentation

## 2023-09-19 DIAGNOSIS — Z87891 Personal history of nicotine dependence: Secondary | ICD-10-CM | POA: Diagnosis not present

## 2023-09-19 DIAGNOSIS — L97519 Non-pressure chronic ulcer of other part of right foot with unspecified severity: Secondary | ICD-10-CM | POA: Insufficient documentation

## 2023-09-19 DIAGNOSIS — I4819 Other persistent atrial fibrillation: Secondary | ICD-10-CM | POA: Diagnosis present

## 2023-09-19 DIAGNOSIS — N189 Chronic kidney disease, unspecified: Secondary | ICD-10-CM | POA: Diagnosis present

## 2023-09-19 DIAGNOSIS — M109 Gout, unspecified: Secondary | ICD-10-CM | POA: Diagnosis present

## 2023-09-19 DIAGNOSIS — I70222 Atherosclerosis of native arteries of extremities with rest pain, left leg: Secondary | ICD-10-CM | POA: Diagnosis present

## 2023-09-19 DIAGNOSIS — N186 End stage renal disease: Secondary | ICD-10-CM

## 2023-09-19 DIAGNOSIS — Z9889 Other specified postprocedural states: Secondary | ICD-10-CM | POA: Diagnosis not present

## 2023-09-19 DIAGNOSIS — Z992 Dependence on renal dialysis: Secondary | ICD-10-CM

## 2023-09-19 DIAGNOSIS — I10 Essential (primary) hypertension: Secondary | ICD-10-CM | POA: Diagnosis present

## 2023-09-19 DIAGNOSIS — Z79899 Other long term (current) drug therapy: Secondary | ICD-10-CM

## 2023-09-19 DIAGNOSIS — I70245 Atherosclerosis of native arteries of left leg with ulceration of other part of foot: Secondary | ICD-10-CM | POA: Diagnosis not present

## 2023-09-19 DIAGNOSIS — M62262 Nontraumatic ischemic infarction of muscle, left lower leg: Secondary | ICD-10-CM | POA: Diagnosis not present

## 2023-09-19 LAB — CBC WITH DIFFERENTIAL/PLATELET
Abs Immature Granulocytes: 0.02 10*3/uL (ref 0.00–0.07)
Basophils Absolute: 0 10*3/uL (ref 0.0–0.1)
Basophils Relative: 1 %
Eosinophils Absolute: 0 10*3/uL (ref 0.0–0.5)
Eosinophils Relative: 1 %
HCT: 41.8 % (ref 36.0–46.0)
Hemoglobin: 13 g/dL (ref 12.0–15.0)
Immature Granulocytes: 1 %
Lymphocytes Relative: 26 %
Lymphs Abs: 1 10*3/uL (ref 0.7–4.0)
MCH: 29.7 pg (ref 26.0–34.0)
MCHC: 31.1 g/dL (ref 30.0–36.0)
MCV: 95.4 fL (ref 80.0–100.0)
Monocytes Absolute: 0.4 10*3/uL (ref 0.1–1.0)
Monocytes Relative: 11 %
Neutro Abs: 2.3 10*3/uL (ref 1.7–7.7)
Neutrophils Relative %: 60 %
Platelets: 166 10*3/uL (ref 150–400)
RBC: 4.38 MIL/uL (ref 3.87–5.11)
RDW: 15.7 % — ABNORMAL HIGH (ref 11.5–15.5)
WBC: 3.9 10*3/uL — ABNORMAL LOW (ref 4.0–10.5)
nRBC: 0 % (ref 0.0–0.2)

## 2023-09-19 LAB — BASIC METABOLIC PANEL WITH GFR
Anion gap: 15 (ref 5–15)
BUN: 38 mg/dL — ABNORMAL HIGH (ref 8–23)
CO2: 28 mmol/L (ref 22–32)
Calcium: 9.7 mg/dL (ref 8.9–10.3)
Chloride: 90 mmol/L — ABNORMAL LOW (ref 98–111)
Creatinine, Ser: 7.56 mg/dL — ABNORMAL HIGH (ref 0.44–1.00)
GFR, Estimated: 5 mL/min — ABNORMAL LOW (ref >60)
Glucose, Bld: 116 mg/dL — ABNORMAL HIGH (ref 70–99)
Potassium: 3.9 mmol/L (ref 3.5–5.1)
Sodium: 133 mmol/L — ABNORMAL LOW (ref 135–145)

## 2023-09-19 LAB — I-STAT CG4 LACTIC ACID, ED
Lactic Acid, Venous: 1.8 mmol/L (ref 0.5–1.9)
Lactic Acid, Venous: 0.9 mmol/L (ref 0.5–1.9)

## 2023-09-19 MED ORDER — OXYCODONE-ACETAMINOPHEN 5-325 MG PO TABS: 1.0000 | ORAL_TABLET | Freq: Once | ORAL | Status: DC

## 2023-09-19 MED ORDER — ONDANSETRON 4 MG PO TBDP
4.0000 mg | ORAL_TABLET | Freq: Once | ORAL | Status: AC
  Administered 2023-09-19: 4 mg via ORAL
  Filled 2023-09-19: qty 1

## 2023-09-19 MED ORDER — MORPHINE SULFATE (PF) 4 MG/ML IV SOLN
4.0000 mg | Freq: Once | INTRAVENOUS | Status: AC
  Administered 2023-09-19: 4 mg via INTRAVENOUS
  Filled 2023-09-19: qty 1

## 2023-09-19 MED ORDER — HYDROCODONE-ACETAMINOPHEN 5-325 MG PO TABS
1.0000 | ORAL_TABLET | Freq: Once | ORAL | Status: AC
  Administered 2023-09-19: 1 via ORAL
  Filled 2023-09-19: qty 1

## 2023-09-19 NOTE — ED Triage Notes (Signed)
 Pt bib ems from home; dialysis pt ([place] restricted), last treatment yesterday; reports vascular issues, blood clots; 4 weeks ago pt reports worsening circulation to L toes, increased pain; reports big toe, middle toe, pinkie toe "rotting"; also having similar pain to when she had blood clot; pt on eliquis ; pms intact, pt ambulatory with ems; P 78, sats 98%; pt also reports she has low blood pressure when taken in upper arm, usually has BP taken on forearm,; denies fevers

## 2023-09-19 NOTE — TOC CM/SW Note (Signed)
 SW spoke with patient's daughter Lauretta Ponto [phone] she stated she will be able to pick up the patient in the morning after 8 am/dropping her son off to school. Patient was scheduled for discharge earlier today with a ride from her daughter but was not picked up, patient stated she didn't know what happened why she wasn't d/c when she had a ride. Patient expressed feeling scared to ride in a cab home unsure if her sister is home and not being able to get in late. SW communicated with Charge nurse [place] and made her aware the patient will be picked up in the am. She understood.   .Rasmus Preusser, MSW, LCSWA Transition of Care  Clinical Social Worker (ED 3-11 Mon-Fri)  [phone]

## 2023-09-19 NOTE — ED Provider Triage Note (Signed)
 Emergency Medicine Provider Triage Evaluation Note  Gloria Gallagher , a 73 y.o. female  was evaluated in triage.  Pt complains of toe pain to the left foot.  States ongoing for 2 weeks since she had her vascular procedure.  Was seen at foot and ankle today and referred to the emergency department for concern of limb ischemia.  Review of Systems  Positive: As above Negative: As above  Physical Exam  BP (!) 99/55   Pulse 70   Temp 97.8 F (36.6 C) (Oral)   Resp 18   SpO2 98%  Gen:   Awake, no distress   Resp:  Normal effort  MSK:   Moves extremities without difficulty  Other:  Dopplerable DP pulses present bilaterally  Medical Decision Making  Medically screening exam initiated at 2:55 PM.  Appropriate orders placed.  Gloria Gallagher was informed that the remainder of the evaluation will be completed by another provider, this initial triage assessment does not replace that evaluation, and the importance of remaining in the ED until their evaluation is complete.  Arterial ultrasound ordered.    Gloria Sabal, PA-C 09/19/23 1456

## 2023-09-19 NOTE — Discharge Instructions (Signed)
 Please follow-up with your vascular specialist in regards to recent symptoms and ER visit.  Today appears her symptoms are not from infection however it appears they are due to poor flow to your toes and so you will need to follow-up with your vascular specialist closely within the next few days for definitive management.  Please take your pain meds that have been prescribed to you on the first and if symptoms change or worsen please return to the ER.

## 2023-09-19 NOTE — H&P (View-Only) (Signed)
 [facility] [place]  ASSESSMENT / PLAN: Gloria Gallagher is a 73 y.o. female with atherosclerosis of native arteries of left lower extremity causing ulceration.  Recommend:  Abstinence from all tobacco products. Blood glucose control with goal A1c < 7%. Blood pressure control with goal blood pressure < 140/90 mmHg. Lipid reduction therapy with goal LDL-C <100 mg/dL  Aspirin  81mg  PO QD.  Clopidogrel  75mg  PO QD. Atorvastatin  40-80mg  PO QD (or other "high intensity" statin therapy).  Patient needs left common femoral - below knee popliteal artery bypass. Will order vein mapping. This can be done as outpatient once ER workup complete.  CHIEF COMPLAINT: left foot pain  HISTORY OF PRESENT ILLNESS: Gloria Gallagher is a 73 y.o. female well-known to the vascular service, having recently undergone endovascular intervention for ischemic rest pain of the left lower extremity by Dr. Fulton Job on 08/22/2023.  She reports progressive pain and a ulcerating left third toe over the past several weeks.  We reviewed the patient's angiogram together, and we counseled the patient that she was likely to need bypass surgery.   Past Medical History:  Diagnosis Date   Acute hypoxic respiratory failure (HCC) 10/01/2022   Acute respiratory failure (HCC) 10/01/2022   Anemia    Aortic atherosclerosis (HCC)    Arthritis    Atrial fibrillation (HCC)    CAD (coronary artery disease)    COVID 2021   mild   Dialysis patient (HCC)    ESRD on hemodialysis (HCC)    MWF at [facility]   GERD (gastroesophageal reflux disease)    Gout    Heart murmur    Hyperparathyroidism (HCC)    Hypertension    in the past, no longer takes medications   Neuromuscular disorder (HCC)    neuropathy   Pneumonia    Seasonal allergies     Past Surgical History:  Procedure Laterality Date   ABDOMINAL AORTOGRAM N/A 08/22/2023   Procedure: ABDOMINAL AORTOGRAM;  Surgeon: Young Hensen, MD;  Location: [facility]  INVASIVE CV LAB;  Service: Cardiovascular;  Laterality: N/A;   ABDOMINAL AORTOGRAM W/LOWER EXTREMITY N/A 12/13/2022   Procedure: ABDOMINAL AORTOGRAM W/LOWER EXTREMITY;  Surgeon: Young Hensen, MD;  Location: [facility] INVASIVE CV LAB;  Service: Cardiovascular;  Laterality: N/A;   CARDIOVERSION N/A 05/16/2021   Procedure: CARDIOVERSION;  Surgeon: Alroy Aspen Lela Purple, MD;  Location: [facility] ENDOSCOPY;  Service: Cardiovascular;  Laterality: N/A;   CARDIOVERSION N/A 08/01/2021   Procedure: CARDIOVERSION;  Surgeon: Luana Rumple, MD;  Location: [facility] ENDOSCOPY;  Service: Cardiovascular;  Laterality: N/A;   CARDIOVERSION N/A 12/05/2021   Procedure: CARDIOVERSION;  Surgeon: Jerryl Morin, DO;  Location: [facility] ENDOSCOPY;  Service: Cardiovascular;  Laterality: N/A;   DIALYSIS FISTULA CREATION Left    LOWER EXTREMITY ANGIOGRAPHY N/A 08/22/2023   Procedure: Lower Extremity Angiography;  Surgeon: Young Hensen, MD;  Location: [facility] INVASIVE CV LAB;  Service: Cardiovascular;  Laterality: N/A;   LOWER EXTREMITY INTERVENTION N/A 08/22/2023   Procedure: LOWER EXTREMITY INTERVENTION;  Surgeon: Young Hensen, MD;  Location: [facility] INVASIVE CV LAB;  Service: Cardiovascular;  Laterality: N/A;   PERICARDIOCENTESIS N/A 10/02/2022   Procedure: PERICARDIOCENTESIS;  Surgeon: Odie Benne, MD;  Location: [facility] INVASIVE CV LAB;  Service: Cardiovascular;  Laterality: N/A;   PERIPHERAL INTRAVASCULAR LITHOTRIPSY  08/22/2023   Procedure: PERIPHERAL INTRAVASCULAR LITHOTRIPSY;  Surgeon: Young Hensen, MD;  Location: [facility] INVASIVE CV LAB;  Service: Cardiovascular;;   REVISON OF ARTERIOVENOUS FISTULA Left 02/16/2022   Procedure: LEFT ARM ARTERIOVENOUS  FISTULA REVISION;  Surgeon: Carlene Che, MD;  Location: [facility] OR;  Service: Vascular;  Laterality: Left;  PERIPHERAL NERVE BLOCK   TUBAL LIGATION      Family History  Problem Relation Age of Onset   Diabetes Sister    Stomach cancer Neg Hx    Colon cancer Neg Hx      Social History   Socioeconomic History   Marital status: Single    Spouse name: Not on file   Number of children: 1   Years of education: Not on file   Highest education level: Not on file  Occupational History   Occupation: retired  Tobacco Use   Smoking status: Former    Types: Cigarettes    Start date: 2004    Passive exposure: Never   Smokeless tobacco: Never   Tobacco comments:    Former smoker 04/25/2021  Vaping Use   Vaping status: Never Used  Substance and Sexual Activity   Alcohol use: Yes    Alcohol/week: 1.0 standard drink of alcohol    Types: 1 Cans of beer per week    Comment: 1 beer every other week 06/20/21   Drug use: Never   Sexual activity: Not Currently  Other Topics Concern   Not on file  Social History Narrative   Not on file   Social Drivers of Health   Financial Resource Strain: Not on file  Food Insecurity: No Food Insecurity (01/18/2022)   Hunger Vital Sign    Worried About Running Out of Food in the Last Year: Never true    Ran Out of Food in the Last Year: Never true  Transportation Needs: No Transportation Needs (01/18/2022)   PRAPARE - Administrator, Civil Service (Medical): No    Lack of Transportation (Non-Medical): No  Physical Activity: Not on file  Stress: Not on file  Social Connections: Not on file  Intimate Partner Violence: Not At Risk (01/18/2022)   Humiliation, Afraid, Rape, and Kick questionnaire    Fear of Current or Ex-Partner: No    Emotionally Abused: No    Physically Abused: No    Sexually Abused: No    Allergies  Allergen Reactions   Penicillin G Hives and Itching   Shrimp (Diagnostic) Hives    Current Facility-Administered Medications  Medication Dose Route Frequency Provider Last Rate Last Admin   0.9 %  sodium chloride  infusion  250 mL Intravenous PRN Young Hensen, MD       sodium chloride  flush (NS) 0.9 % injection 3 mL  3 mL Intravenous Q12H Young Hensen, MD       Current  Outpatient Medications  Medication Sig Dispense Refill   acetaminophen -codeine (TYLENOL  #2) 300-15 MG tablet Take 1 tablet by mouth every 4 (four) hours as needed for moderate pain (pain score 4-6).     amiodarone  (PACERONE ) 200 MG tablet TAKE 1 TABLET BY MOUTH DAILY (Patient taking differently: Take 200 mg by mouth every evening.) 100 tablet 3   apixaban  (ELIQUIS ) 5 MG TABS tablet TAKE 1 TABLET BY MOUTH TWICE  DAILY 200 tablet 0   atorvastatin  (LIPITOR) 20 MG tablet Take 20 mg by mouth every evening.     cilostazol  (PLETAL ) 50 MG tablet Take 1 tablet (50 mg total) by mouth 2 (two) times daily. 60 tablet 11   cinacalcet (SENSIPAR) 60 MG tablet Take 60 mg by mouth at bedtime.     clopidogrel  (PLAVIX ) 75 MG tablet Take 1 tablet (75 mg total) by  mouth daily. (Patient taking differently: Take 75 mg by mouth every evening.) 30 tablet 11   ferric citrate  (AURYXIA ) 1 GM 210 MG(Fe) tablet Take 1 tablet (210 mg total) by mouth 3 (three) times daily with meals. (Patient taking differently: Take 630 mg by mouth 3 (three) times daily with meals.) 270 tablet 1   HYDROcodone-acetaminophen  (NORCO/VICODIN) 5-325 MG tablet Take 1 tablet by mouth every 6 (six) hours as needed for moderate pain (pain score 4-6).     multivitamin (RENA-VIT) TABS tablet Take 1 tablet by mouth at bedtime. 100 tablet 0   doxycycline  (VIBRAMYCIN ) 50 MG capsule Take 100 mg by mouth 2 (two) times daily. (Patient not taking: Reported on 09/19/2023)      PHYSICAL EXAM Vitals:   09/19/23 1447 09/19/23 1449 09/19/23 1604 09/19/23 1744  BP: (!) 99/55   125/65  Pulse: 70  61 63  Resp: 18   16  Temp:  97.8 F (36.6 C)  97.6 F (36.4 C)  TempSrc:  Oral  Oral  SpO2: 98%  100% 99%   Chronically ill elderly woman in no distress Regular rate and rhythm Unlabored breathing No palpable pedal pulses Ischemic ulceration to toes of left foot     PERTINENT LABORATORY AND RADIOLOGIC DATA  Most recent CBC    Latest Ref Rng & Units 09/19/2023     2:55 PM 08/22/2023    1:05 PM 12/13/2022    8:47 AM  CBC  WBC 4.0 - 10.5 K/uL 3.9     Hemoglobin 12.0 - 15.0 g/dL 62.9  52.8  41.3   Hematocrit 36.0 - 46.0 % 41.8  40.0  43.0   Platelets 150 - 400 K/uL 166        Most recent CMP    Latest Ref Rng & Units 09/19/2023    2:55 PM 08/22/2023    1:05 PM 12/13/2022    8:47 AM  CMP  Glucose 70 - 99 mg/dL 244  88  78   BUN 8 - 23 mg/dL 38  65  43   Creatinine 0.44 - 1.00 mg/dL 0.10  2.72  5.36   Sodium 135 - 145 mmol/L 133  136  136   Potassium 3.5 - 5.1 mmol/L 3.9  5.6  4.2   Chloride 98 - 111 mmol/L 90  94  93   CO2 22 - 32 mmol/L 28     Calcium  8.9 - 10.3 mg/dL 9.7      Hgb U4Q MFr Bld (%)  Date Value  01/18/2022 5.3   Recent angiogram by Dr. Fulton Job 08/22/2023, personally reviewed.  Good technical result from left external iliac angioplasty; occlusion of the superficial femoral artery and above-knee popliteal artery on the left.  There is a appropriate below-knee popliteal target for bypass.  Heber Little. Edgardo Goodwill, MD FACS [facility] Phone Number: [phone] 09/19/2023 7:04 PM   Total time spent on preparing this encounter including chart review, data review, collecting history, examining the patient, and coordinating care:  inpatient consult - high complexity - 80 minutes (CPT 99255)  Portions of this report may have been transcribed using voice recognition software.  Every effort has been made to ensure accuracy; however, inadvertent computerized transcription errors may still be present.

## 2023-09-19 NOTE — ED Provider Notes (Signed)
 [place] EMERGENCY DEPARTMENT AT [facility] Provider Note   CSN: [id number] Arrival date & time: 09/19/23  1439     History  No chief complaint on file.   Gloria Gallagher is a 73 y.o. female history of A-fib on Eliquis  and amiodarone , ESRD on dialysis, critical limb ischemia status post recatheterization of the left leg for 1025 presented with left toes and left foot pain.  Pain is constant and does not at rest.  Patient states she has been compliant with dialysis and denies any fevers.  Patient states that her middle toe is starting to rot but that she can still feel her toes.  Home Medications Prior to Admission medications   Medication Sig Start Date End Date Taking? Authorizing Provider  acetaminophen -codeine (TYLENOL  #2) 300-15 MG tablet Take 1 tablet by mouth every 4 (four) hours as needed for moderate pain (pain score 4-6).   Yes [provider]  amiodarone  (PACERONE ) 200 MG tablet TAKE 1 TABLET BY MOUTH DAILY Patient taking differently: Take 200 mg by mouth every evening. 11/22/22  Yes Nahser, Lela Purple, MD  apixaban  (ELIQUIS ) 5 MG TABS tablet TAKE 1 TABLET BY MOUTH TWICE  DAILY 09/05/23  Yes Nahser, Lela Purple, MD  atorvastatin  (LIPITOR) 20 MG tablet Take 20 mg by mouth every evening. 12/05/21  Yes [provider]  cilostazol  (PLETAL ) 50 MG tablet Take 1 tablet (50 mg total) by mouth 2 (two) times daily. 07/02/23  Yes Butch Cashing, PA-C  cinacalcet (SENSIPAR) 60 MG tablet Take 60 mg by mouth at bedtime.   Yes [provider]  clopidogrel  (PLAVIX ) 75 MG tablet Take 1 tablet (75 mg total) by mouth daily. Patient taking differently: Take 75 mg by mouth every evening. 08/22/23 08/21/24 Yes Young Hensen, MD  ferric citrate  (AURYXIA ) 1 GM 210 MG(Fe) tablet Take 1 tablet (210 mg total) by mouth 3 (three) times daily with meals. Patient taking differently: Take 630 mg by mouth 3 (three) times daily with meals. 01/19/22  Yes Ernestina Headland, MD   HYDROcodone -acetaminophen  (NORCO/VICODIN) 5-325 MG tablet Take 1 tablet by mouth every 6 (six) hours as needed for moderate pain (pain score 4-6).   Yes [provider]  multivitamin (RENA-VIT) TABS tablet Take 1 tablet by mouth at bedtime. 10/05/22  Yes Ernestina Headland, MD  doxycycline  (VIBRAMYCIN ) 50 MG capsule Take 100 mg by mouth 2 (two) times daily. Patient not taking: Reported on 09/19/2023    [provider]      Allergies    Penicillin g and Shrimp (diagnostic)    Review of Systems   Review of Systems  Physical Exam Updated Vital Signs BP 125/65 (BP Location: Right Arm)   Pulse 63   Temp 97.6 F (36.4 C) (Oral)   Resp 16   SpO2 99%  Physical Exam Constitutional:      General: She is not in acute distress. Cardiovascular:     Comments: Unable to palpate pulses and so we will get Doppler Musculoskeletal:     Comments: 1+ bilateral pitting edema No calf tenderness  Skin:    Capillary Refill: Capillary refill takes more than 3 seconds.     Comments: Discoloration in bilateral feet where the toes are Left middle toe has ulcer however no signs of purulent drainage  Neurological:     Mental Status: She is alert.     Comments: Sensation equal bilaterally     ED Results / Procedures / Treatments   Labs (all labs ordered  are listed, but only abnormal results are displayed) Labs Reviewed  CBC WITH DIFFERENTIAL/PLATELET - Abnormal; Notable for the following components:      Result Value   WBC 3.9 (*)    RDW 15.7 (*)    All other components within normal limits  BASIC METABOLIC PANEL WITH GFR - Abnormal; Notable for the following components:   Sodium 133 (*)    Chloride 90 (*)    Glucose, Bld 116 (*)    BUN 38 (*)    Creatinine, Ser 7.56 (*)    GFR, Estimated 5 (*)    All other components within normal limits  I-STAT CG4 LACTIC ACID, ED  I-STAT CG4 LACTIC ACID, ED    EKG None  Radiology No results found.  Procedures Procedures     Medications Ordered in ED Medications  HYDROcodone -acetaminophen  (NORCO/VICODIN) 5-325 MG per tablet 1 tablet (1 tablet Oral Given 09/19/23 1503)  ondansetron  (ZOFRAN -ODT) disintegrating tablet 4 mg (4 mg Oral Given 09/19/23 1503)  morphine  (PF) 4 MG/ML injection 4 mg (4 mg Intravenous Given 09/19/23 1746)    ED Course/ Medical Decision Making/ A&P                                 Medical Decision Making Risk Prescription drug management. Decision regarding hospitalization.   Gloria Gallagher 73 y.o. presented today for toe infection. Working DDx that I considered at this time includes, but not limited to, critical limb ischemia, infected ulcer, cellulitis, osteomyelitis, sepsis.  R/o DDx:  infected ulcer, cellulitis, osteomyelitis, sepsis: These are considered less likely due to history of present illness, physical exam, labs/imaging findings  Review of prior external notes: 09/05/2023 office visit  Unique Tests and My Independent Interpretation:  CBC: Unremarkable BMP: At baseline Lactic acid: Unremarkable  Social Determinants of Health: none  Discussion with Independent Historian: Daughter  Discussion of Management of Tests: Adriana Hopping, Vascular; Rufina Cough, MD Hospitalist; Edgardo Goodwill, MD Vascular  Risk: High: hospitalization or escalation of hospital-level care  Risk Stratification Score: None  Plan: On exam patient was no acute distress but noticed to be mildly hypotensive at 99/55.  On exam patient does have ulcer in her left middle toe and does have discoloration all of her toes.  Patient is not endorsing any infectious symptoms and does not have white count and so I do not believe she has an infected ulcer however given the fact she just had revascularization done on that limb we will consult vascular for recommendations.  ABIs been ordered from triage.  Patient's cap refill is greater than 3 seconds and I am unable to palpate pulses so we will get Doppler.  I spoke to vascular  and the vascular surgeon is currently in the OR but will call back.  After discussion with the attending we will admit to medicine as patient has failed outpatient pain medications and she will need vascular to examine her foot.  I spoke to the hospitalist and he states he will wait for vascular's recommendations but will go by to see the patient.  I spoke to the vascular specialist and he states that patient can follow-up in the outpatient setting to be evaluated as he does not feel that patient needs to be admitted for this.  Patient was made aware of this plan and verbalized agreement.  I do recommend that patient continues her Norco that was prescribed to her on 09/12/2023 and to follow-up closely with her  vascular specialist for definitive management.  Patient given return precautions.  Patient verbalized understanding and agreement with this plan.  Patient stable to be discharged.  This chart was dictated using voice recognition software.  Despite best efforts to proofread,  errors can occur which can change the documentation meaning.         Final Clinical Impression(s) / ED Diagnoses Final diagnoses:  Critical limb ischemia of left lower extremity [facility])    Rx / DC Orders ED Discharge Orders     None         Elex Grimmer 09/19/23 1931    Guadalupe Lee, MD 09/20/23 [phone]

## 2023-09-19 NOTE — Consult Note (Signed)
 [facility] [place]  ASSESSMENT / PLAN: Gloria Gallagher is a 73 y.o. female with atherosclerosis of native arteries of left lower extremity causing ulceration.  Recommend:  Abstinence from all tobacco products. Blood glucose control with goal A1c < 7%. Blood pressure control with goal blood pressure < 140/90 mmHg. Lipid reduction therapy with goal LDL-C <100 mg/dL  Aspirin  81mg  PO QD.  Clopidogrel  75mg  PO QD. Atorvastatin  40-80mg  PO QD (or other "high intensity" statin therapy).  Patient needs left common femoral - below knee popliteal artery bypass. Will order vein mapping. This can be done as outpatient once ER workup complete.  CHIEF COMPLAINT: left foot pain  HISTORY OF PRESENT ILLNESS: Gloria Gallagher is a 73 y.o. female well-known to the vascular service, having recently undergone endovascular intervention for ischemic rest pain of the left lower extremity by Dr. Fulton Job on 08/22/2023.  She reports progressive pain and a ulcerating left third toe over the past several weeks.  We reviewed the patient's angiogram together, and we counseled the patient that she was likely to need bypass surgery.   Past Medical History:  Diagnosis Date   Acute hypoxic respiratory failure (HCC) 10/01/2022   Acute respiratory failure (HCC) 10/01/2022   Anemia    Aortic atherosclerosis (HCC)    Arthritis    Atrial fibrillation (HCC)    CAD (coronary artery disease)    COVID 2021   mild   Dialysis patient (HCC)    ESRD on hemodialysis (HCC)    MWF at [facility]   GERD (gastroesophageal reflux disease)    Gout    Heart murmur    Hyperparathyroidism (HCC)    Hypertension    in the past, no longer takes medications   Neuromuscular disorder (HCC)    neuropathy   Pneumonia    Seasonal allergies     Past Surgical History:  Procedure Laterality Date   ABDOMINAL AORTOGRAM N/A 08/22/2023   Procedure: ABDOMINAL AORTOGRAM;  Surgeon: Young Hensen, MD;  Location: [facility]  INVASIVE CV LAB;  Service: Cardiovascular;  Laterality: N/A;   ABDOMINAL AORTOGRAM W/LOWER EXTREMITY N/A 12/13/2022   Procedure: ABDOMINAL AORTOGRAM W/LOWER EXTREMITY;  Surgeon: Young Hensen, MD;  Location: [facility] INVASIVE CV LAB;  Service: Cardiovascular;  Laterality: N/A;   CARDIOVERSION N/A 05/16/2021   Procedure: CARDIOVERSION;  Surgeon: Alroy Aspen Lela Purple, MD;  Location: [facility] ENDOSCOPY;  Service: Cardiovascular;  Laterality: N/A;   CARDIOVERSION N/A 08/01/2021   Procedure: CARDIOVERSION;  Surgeon: Luana Rumple, MD;  Location: [facility] ENDOSCOPY;  Service: Cardiovascular;  Laterality: N/A;   CARDIOVERSION N/A 12/05/2021   Procedure: CARDIOVERSION;  Surgeon: Jerryl Morin, DO;  Location: [facility] ENDOSCOPY;  Service: Cardiovascular;  Laterality: N/A;   DIALYSIS FISTULA CREATION Left    LOWER EXTREMITY ANGIOGRAPHY N/A 08/22/2023   Procedure: Lower Extremity Angiography;  Surgeon: Young Hensen, MD;  Location: [facility] INVASIVE CV LAB;  Service: Cardiovascular;  Laterality: N/A;   LOWER EXTREMITY INTERVENTION N/A 08/22/2023   Procedure: LOWER EXTREMITY INTERVENTION;  Surgeon: Young Hensen, MD;  Location: [facility] INVASIVE CV LAB;  Service: Cardiovascular;  Laterality: N/A;   PERICARDIOCENTESIS N/A 10/02/2022   Procedure: PERICARDIOCENTESIS;  Surgeon: Odie Benne, MD;  Location: [facility] INVASIVE CV LAB;  Service: Cardiovascular;  Laterality: N/A;   PERIPHERAL INTRAVASCULAR LITHOTRIPSY  08/22/2023   Procedure: PERIPHERAL INTRAVASCULAR LITHOTRIPSY;  Surgeon: Young Hensen, MD;  Location: [facility] INVASIVE CV LAB;  Service: Cardiovascular;;   REVISON OF ARTERIOVENOUS FISTULA Left 02/16/2022   Procedure: LEFT ARM ARTERIOVENOUS  FISTULA REVISION;  Surgeon: Carlene Che, MD;  Location: [facility] OR;  Service: Vascular;  Laterality: Left;  PERIPHERAL NERVE BLOCK   TUBAL LIGATION      Family History  Problem Relation Age of Onset   Diabetes Sister    Stomach cancer Neg Hx    Colon cancer Neg Hx      Social History   Socioeconomic History   Marital status: Single    Spouse name: Not on file   Number of children: 1   Years of education: Not on file   Highest education level: Not on file  Occupational History   Occupation: retired  Tobacco Use   Smoking status: Former    Types: Cigarettes    Start date: 2004    Passive exposure: Never   Smokeless tobacco: Never   Tobacco comments:    Former smoker 04/25/2021  Vaping Use   Vaping status: Never Used  Substance and Sexual Activity   Alcohol use: Yes    Alcohol/week: 1.0 standard drink of alcohol    Types: 1 Cans of beer per week    Comment: 1 beer every other week 06/20/21   Drug use: Never   Sexual activity: Not Currently  Other Topics Concern   Not on file  Social History Narrative   Not on file   Social Drivers of Health   Financial Resource Strain: Not on file  Food Insecurity: No Food Insecurity (01/18/2022)   Hunger Vital Sign    Worried About Running Out of Food in the Last Year: Never true    Ran Out of Food in the Last Year: Never true  Transportation Needs: No Transportation Needs (01/18/2022)   PRAPARE - Administrator, Civil Service (Medical): No    Lack of Transportation (Non-Medical): No  Physical Activity: Not on file  Stress: Not on file  Social Connections: Not on file  Intimate Partner Violence: Not At Risk (01/18/2022)   Humiliation, Afraid, Rape, and Kick questionnaire    Fear of Current or Ex-Partner: No    Emotionally Abused: No    Physically Abused: No    Sexually Abused: No    Allergies  Allergen Reactions   Penicillin G Hives and Itching   Shrimp (Diagnostic) Hives    Current Facility-Administered Medications  Medication Dose Route Frequency Provider Last Rate Last Admin   0.9 %  sodium chloride  infusion  250 mL Intravenous PRN Young Hensen, MD       sodium chloride  flush (NS) 0.9 % injection 3 mL  3 mL Intravenous Q12H Young Hensen, MD       Current  Outpatient Medications  Medication Sig Dispense Refill   acetaminophen -codeine (TYLENOL  #2) 300-15 MG tablet Take 1 tablet by mouth every 4 (four) hours as needed for moderate pain (pain score 4-6).     amiodarone  (PACERONE ) 200 MG tablet TAKE 1 TABLET BY MOUTH DAILY (Patient taking differently: Take 200 mg by mouth every evening.) 100 tablet 3   apixaban  (ELIQUIS ) 5 MG TABS tablet TAKE 1 TABLET BY MOUTH TWICE  DAILY 200 tablet 0   atorvastatin  (LIPITOR) 20 MG tablet Take 20 mg by mouth every evening.     cilostazol  (PLETAL ) 50 MG tablet Take 1 tablet (50 mg total) by mouth 2 (two) times daily. 60 tablet 11   cinacalcet (SENSIPAR) 60 MG tablet Take 60 mg by mouth at bedtime.     clopidogrel  (PLAVIX ) 75 MG tablet Take 1 tablet (75 mg total) by  mouth daily. (Patient taking differently: Take 75 mg by mouth every evening.) 30 tablet 11   ferric citrate  (AURYXIA ) 1 GM 210 MG(Fe) tablet Take 1 tablet (210 mg total) by mouth 3 (three) times daily with meals. (Patient taking differently: Take 630 mg by mouth 3 (three) times daily with meals.) 270 tablet 1   HYDROcodone-acetaminophen  (NORCO/VICODIN) 5-325 MG tablet Take 1 tablet by mouth every 6 (six) hours as needed for moderate pain (pain score 4-6).     multivitamin (RENA-VIT) TABS tablet Take 1 tablet by mouth at bedtime. 100 tablet 0   doxycycline  (VIBRAMYCIN ) 50 MG capsule Take 100 mg by mouth 2 (two) times daily. (Patient not taking: Reported on 09/19/2023)      PHYSICAL EXAM Vitals:   09/19/23 1447 09/19/23 1449 09/19/23 1604 09/19/23 1744  BP: (!) 99/55   125/65  Pulse: 70  61 63  Resp: 18   16  Temp:  97.8 F (36.6 C)  97.6 F (36.4 C)  TempSrc:  Oral  Oral  SpO2: 98%  100% 99%   Chronically ill elderly woman in no distress Regular rate and rhythm Unlabored breathing No palpable pedal pulses Ischemic ulceration to toes of left foot     PERTINENT LABORATORY AND RADIOLOGIC DATA  Most recent CBC    Latest Ref Rng & Units 09/19/2023     2:55 PM 08/22/2023    1:05 PM 12/13/2022    8:47 AM  CBC  WBC 4.0 - 10.5 K/uL 3.9     Hemoglobin 12.0 - 15.0 g/dL 62.9  52.8  41.3   Hematocrit 36.0 - 46.0 % 41.8  40.0  43.0   Platelets 150 - 400 K/uL 166        Most recent CMP    Latest Ref Rng & Units 09/19/2023    2:55 PM 08/22/2023    1:05 PM 12/13/2022    8:47 AM  CMP  Glucose 70 - 99 mg/dL 244  88  78   BUN 8 - 23 mg/dL 38  65  43   Creatinine 0.44 - 1.00 mg/dL 0.10  2.72  5.36   Sodium 135 - 145 mmol/L 133  136  136   Potassium 3.5 - 5.1 mmol/L 3.9  5.6  4.2   Chloride 98 - 111 mmol/L 90  94  93   CO2 22 - 32 mmol/L 28     Calcium  8.9 - 10.3 mg/dL 9.7      Hgb U4Q MFr Bld (%)  Date Value  01/18/2022 5.3   Recent angiogram by Dr. Fulton Job 08/22/2023, personally reviewed.  Good technical result from left external iliac angioplasty; occlusion of the superficial femoral artery and above-knee popliteal artery on the left.  There is a appropriate below-knee popliteal target for bypass.  Heber Little. Edgardo Goodwill, MD FACS [facility] Phone Number: [phone] 09/19/2023 7:04 PM   Total time spent on preparing this encounter including chart review, data review, collecting history, examining the patient, and coordinating care:  inpatient consult - high complexity - 80 minutes (CPT 99255)  Portions of this report may have been transcribed using voice recognition software.  Every effort has been made to ensure accuracy; however, inadvertent computerized transcription errors may still be present.

## 2023-09-19 NOTE — Progress Notes (Signed)
 VASCULAR LAB    Sent for patient via transport at 1815, job dispatched at [facility], patient had not arrived at 51.  Called RN, transport had not been to pick up patient, but patient to be admitted.  I messaged Ron Ethelle Herb, transporter, and asked him to cancel the job and asked the RN to make him aware that I was not getting the patient if he arrived to pick up patient.     Lennette Fader, RVT 09/19/2023, 6:48 PM

## 2023-09-20 ENCOUNTER — Other Ambulatory Visit: Payer: Self-pay

## 2023-09-20 ENCOUNTER — Telehealth: Payer: Self-pay

## 2023-09-20 ENCOUNTER — Emergency Department (HOSPITAL_COMMUNITY)

## 2023-09-20 DIAGNOSIS — I70222 Atherosclerosis of native arteries of extremities with rest pain, left leg: Secondary | ICD-10-CM

## 2023-09-20 DIAGNOSIS — M62262 Nontraumatic ischemic infarction of muscle, left lower leg: Secondary | ICD-10-CM

## 2023-09-20 NOTE — Progress Notes (Signed)
 Patient was supposed to be discharged yesterday during the day shift. There seemed to be some confusion regarding her ride . She told me her friend was to pick her up and when I called her friend listed in the contact information , she told me "the patient's  daughter had already picked he up from the hospital". I explained that that was not the case and she told me to call the daughter which I did do.  The daughter couldn't leave bc she had a young son sleeping and couldn't leave him alone .  But the biggest obstacle was that the patient's sister who is not listed as a contact , is who the patient says she resides with. The patient stated that her sister is not home and she doesn't feel comfortable staying alone at this time with her toe infection and decreased mobility.  I made [place] aware and Chief Technology Officer as well .  Devin came to speak to the patient and her daughter (on the telephone ). And it was decided for the safety of the patient , it was best to let her stay overnight .  The daughter has agreed to come pick her mother up in the morning .

## 2023-09-20 NOTE — Telephone Encounter (Signed)
 Attempted to call for surgery scheduling. LVM

## 2023-09-21 LAB — VAS US ABI WITH/WO TBI
Left ABI: 0.43
Right ABI: 0.4

## 2023-09-23 ENCOUNTER — Telehealth: Payer: Self-pay

## 2023-09-23 NOTE — Telephone Encounter (Signed)
 Spoke to patient and friend, Gloria Gallagher, regarding surgery scheduling.  Patient stated that she didn't receive the VM that this nurse had left on Friday, 5/9.  Patient had friend call due to the pain she is experiencing.   This nurse explained the reasoning for scheduling patient for surgery - Dr. Edgardo Goodwill was specific about which day her surgery should be scheduled (5/14).  This nurse placed her on the schedule so that she would have a time/date.   Patient stated that she knew nothing about a bypass surgery but if it will help her leg, she was willing to go forward with scheduled Wednesday surgery.  Patient and friend will also call and speak to the daughter, who was present during recent hospital stay.

## 2023-09-24 ENCOUNTER — Encounter (HOSPITAL_COMMUNITY): Payer: Self-pay | Admitting: Vascular Surgery

## 2023-09-24 ENCOUNTER — Other Ambulatory Visit: Payer: Self-pay

## 2023-09-24 NOTE — Progress Notes (Signed)
 Anesthesia Chart Review: Gloria Gallagher  Case: [id number] Date/Time: 09/25/23 0815   Procedure: BYPASS GRAFT FEMORAL-POPLITEAL ARTERY (Left)   Anesthesia type: General   Diagnosis: Atherosclerosis of native artery of left lower extremity with rest pain (HCC) [I70.222]   Pre-op diagnosis: CLI LLE   [facility] ROOM 11 / [facility]   Surgeons: Carlene Che, MD       DISCUSSION: Patient is a 73 year old female scheduled for the above procedure. ED visit 09/19/23 for progressive rest pain and ulceration of left 3rd toe despite recent recent lithotripsy balloon of left EIA and with known occlusion left SFA. Vascular surgery consulted and the above procedure planned.   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  She is a poor candidate for repair ").  She was admitted 10/01/22-10/06/22 for acute hypoxic respiratory failure despite HD treatment. CXR showed no acute pathology. CTA chest negative for PE but showed evidence of pulmonary hypertension and a large pericardial effusion and a 1 cm RLL lung nodule. US  showed polycystic kidneys and liver. EKG showed afib (known history). She required BiPAP initially, urgent HD, IV amiodarone , and started on pressors for MAP in the 50's.  She underwent pericardiocentesis and drain placement by cardiology on 10/02/22 with removal of  950 mL of serosanguinous fluid. Fluid did not grow any organisms and cytology did not identify any malignant cells. CA-125 was elevated at 50.05, and CA 19-9 elevated at 38. Other lab results included QuantiFERON  TB Gold was negative on 10/05/22. CEA 3.8. Anti-scleroderma Ab < 0.2, Aldolase 7.6, C4 Complement 22, C3 Complement 100, CRP 14.4 (H), Sed Rate 50 (H), CK 47, CCP Ab IgG/IgA 17, RF < 10.0, ANA Ab IFA negative, ACNC Profile (Anti-MPO Ab & Anti-PR3 Ab < 0.2; C-ANCA & P-ANCA < 1:20).  Out-patient colonoscopy, and chest CT recommended, as well as cardiology follow-up for pericardial effusion and future echo. She was also referred to [facility].   She had oncology follow-up on 10/25/22 with Walisiewicz, Kaitlyn, PA-C at the [facility] for consultation regarding elevated tumor markers. Repeat CA 125 was normal at 22.2 (down from 50.05), AFP tumor marker was 8.8, CA 19-9 remained elevated at 54 (previously 38).  CT abd/pelvis ordered which showed chronic changes of polycystic kidney disease with massively enlarged multi-cystic kidneys and innumerable hepatic cysts, high attenuation in the gallbladder likely a combination of sludge and gallstone, and a lobulated 11 mm RLL pulmonary nodule (previously recommendation on 10/01/22 for consideration of 3 month repeat CT versus PET CT versus tissues sampling). According to 11/27/22 telephone encounter by Walisiewicz, Kaitlyn, PA-C, "Discussed with patient as CT scan is reassuring, AFP and CA 125 were WNL, and CA 19-9 with minimal elevation which could be explained by ESRD vs gallstones she does not require additional work up with oncology at this time." She did however, advise patient to follow-up with PCP for on-going monitoring recommendations for pulmonary nodule and for gallbladder disease.   Last cardiology visit with Dr. Alroy Aspen was on 02/19/23 for follow-up PAF, dilated pulmonary artery, AI with moderate dilatation of ascending aorta, and pericardial effusion. He noted her moderate AI and  ascending aorta dilation (47 mm) were stable. He felt given she is "fairly frail", she was not a great candidate for surgical repair but if worsening  would need further discussion. One year follow-up planned. Follow-up is not yet scheduled.   I do not see any additional chest imaging since May 2024, and reported she denied follow-up chest imaging. I do not currently have any primary care records available. She denied SOB at rest or with walking flat surfaces. She does have chronic DOE with walking up stairs.   She does hemodialysis on MWF. She last had HD on 09/23/23. Her understanding is that she would have in-patient HD after surgery. . Per [facility], advised to hold Eliquis  starting 09/21/23. She is also on Plavix  and Pletal . She had not received instructions on holding Plavix , so has not held. Dr. Edgardo Goodwill is awarer and is okay to proceed.    She is a same day work-up with progressive rest pain and toe ulceration.  Discussed available information with anesthesiologist Jonne Netters, MD. Anesthesia team to evaluate on the day of surgery, and would defer consideration of bedside or limited echo based on their exam findings.     VS: Ht 5\' 5"  (1.651 m)   Wt 65.8 kg   BMI 24.13 kg/m  BP Readings from Last 3 Encounters:  09/20/23 134/73  08/22/23 (!) 104/57  08/15/23 122/76   Pulse Readings from Last 3 Encounters:  09/20/23 69  08/22/23 77  08/15/23 77     PROVIDERS: Maryellen Snare, NP is PCP [facility]) Alroy Aspen, Anda Bamberg, MD is cardiologist   LABS: For day of procedure as indicated.  Most recent results in [facility] include: Lab Results  Component Value Date   WBC 3.9 (L) 09/19/2023   HGB 13.0 09/19/2023   HCT 41.8 09/19/2023   PLT 166 09/19/2023   GLUCOSE 116 (H) 09/19/2023   ALT 8 10/25/2022   AST 11 (L) 10/25/2022   NA 133 (L) 09/19/2023   K 3.9 09/19/2023   CL 90 (L) 09/19/2023   CREATININE 7.56 (H) 09/19/2023   BUN 38 (H) 09/19/2023   CO2 28 09/19/2023   TSH 1.909 10/06/2022   HGBA1C 5.3 01/18/2022     IMAGES: CT Abd/pelvis 11/20/22: IMPRESSION: 1. Chronic changes of autosomal dominant polycystic kidney disease with  massively enlarged multi cystic kidneys with scattered calcifications. No worrisome renal lesions are identified. 2. Innumerable hepatic cysts consistent with polycystic kidney disease. There is a large rim calcified lesion in the right hepatic lobe in segment 7 which is an indeterminate finding but could be a benign process such as complex hemorrhagic cyst, prior abscess or echinococcal disease. 3. High attenuation material in the gallbladder likely a combination of sludge and gallstones. 4. Lobulated right lower lobe pulmonary nodule measures 11 mm and was also noted on recent chest CT from 10/01/2022. Please see follow-up recommendations from that study.   CTA Chest 10/01/22: - Cardiovascular: There is mild cardiomegaly. There has been interval development of a large pericardial effusion measuring up to 2.2 cm in thickness. Correlation with clinical exam recommended to evaluate for possibility of cardiac tamponade. Three vessel coronary vascular calcification. There is retrograde flow of contrast from the right atrium into the IVC suggestive of right heart dysfunction. There is moderate atherosclerotic calcification of the thoracic aorta. No aneurysmal dilatation. Dilatation of the main pulmonary trunk suggestive of pulmonary hypertension. Evaluation of the pulmonary arteries is limited due to respiratory motion and suboptimal visualization of the peripheral branches. No central pulmonary  artery embolus identified. IMPRESSION: 1. No CT evidence of central pulmonary artery embolus. 2. Interval development of a large pericardial effusion. Correlation with clinical exam recommended to evaluate for possibility of cardiac tamponade. 3. Small left pleural effusion and left lung base atelectasis. Pneumonia is not excluded. 4. A 1 cm right lower lobe nodule. Consider one of the following in 3 months for both low-risk and high-risk individuals: (a) repeat chest CT, (b) follow-up PET-CT,  or (c) tissue sampling. This recommendation follows the consensus statement: Guidelines for Management of Incidental Pulmonary Nodules Detected on CT Images: From the Fleischner Society 2017; Radiology 2017; 284:228-243. 5.  Aortic Atherosclerosis (ICD10-I70.0).  CT Head & CTA Head/Neck 04/18/21: IMPRESSION: CT head: 1. Large right middle ear/mastoid effusion. 2. No evidence of acute intracranial abnormality. 3. Moderate chronic small vessel ischemic changes within the cerebral white matter. 4. Mild generalized cerebral and cerebellar atrophy.   CTA neck: 1. The common carotid and internal carotid arteries are patent within the neck. Atherosclerotic plaque within both carotid systems, as described. Estimated 60% atherosclerotic stenosis within the proximal/mid right ICA. No hemodynamically significant stenosis within the left CCA or cervical ICA. 2. Vertebral arteries patent within the neck. Calcified plaque results in apparent severe stenosis at the origin of the non-dominant right vertebral artery. Mild atherosclerotic narrowing at the origin of the left vertebral artery. 3. Aortic Atherosclerosis (ICD10-I70.0) and Emphysema (ICD10-J43.9).   CTA head: 1. Intracranial atherosclerotic disease with multifocal stenoses, most notably as follows. 2. Moderate/severe stenosis of the cavernous right ICA. 3. Moderate/severe stenosis of the supraclinoid left ICA. 4. 1 mm aneurysm versus focus of eccentric plaque along the anteromedial aspect of the V4 left vertebral artery.    EKG: EKG 10/02/22 (during admission for pericardial effusion, documented afib): Undetermined rhythm Minimal voltage criteria for LVH, may be normal variant ( Cornell product ) Inferior infarct , age undetermined Abnormal ECG When compared with ECG of 25-Sep-2022 03:00, No significant change was found Confirmed by Marlane Silver [phone]) on 10/02/2022 8:57:41 PM - EKG appears to show afib at 93  bpm.   CV: Echo (Limited Post-Pericardiocentesis) 10/02/22: Conclusion(s)/Recommendation(s): Periprocedural echo for  pericardiocentesis. The postprocedural images show a small residual  effusion and resolution of RV and RA diastolic collapse and reduction in  heart rate. There is a small residual pericardial  effusion anterolateral to the left ventricle. Fibrinous strands are seen  in that area and the effusion may be partially loculated. The vast  majority of the effusion is successfully drained.   FINDINGS   Aortic Valve: Aortic regurgitation PHT measures 503 msec.    TTE (Pre-Pericardiocentesis) 10/02/22: IMPRESSIONS   1. Left ventricular ejection fraction, by estimation, is 60 to 65%. The  left ventricle has normal function. The left ventricle has no regional  wall motion abnormalities. There is mild concentric left ventricular  hypertrophy. Indeterminate diastolic filling due to E-A fusion.   2. Right ventricular systolic function is moderately reduced. The right ventricular size is mildly enlarged. Mildly increased right ventricular wall thickness. There is normal pulmonary artery systolic pressure. The estimated right ventricular systolic  pressure is 35.6 mmHg.   3. Left atrial size was moderately dilated.   4. Right atrial size was moderately dilated.   5. There is marked cystic change of the left lobe of the liver and there is evidence of severe polycystic changes of the neighboring kidney tissue. Large pericardial effusion. Findings are consistent with cardiac tamponade.   6. The mitral valve is normal in structure. Trivial  mitral valve  regurgitation. No evidence of mitral stenosis.   7. The aortic valve is tricuspid. There is mild calcification of the  aortic valve. Aortic valve regurgitation is moderate. Aortic valve  sclerosis is present, with no evidence of aortic valve stenosis.   8. Pulmonic valve regurgitation is moderate.   9. Aortic dilatation noted. There is  mild dilatation of the aortic root,  measuring 41 mm. There is moderate dilatation of the ascending aorta, measuring 47 mm.  10. The inferior vena cava is dilated in size with <50% respiratory  variability, suggesting right atrial pressure of 15 mmHg.  - Comparison(s): Prior images reviewed side by side. Changes from prior study are noted. There is a new large pericardial effusion with echo evidence of tamponade. Otherwise no change from the 07/04/2021 study.    MRI Cardiac 08/22/21: IMPRESSION: 1 Tri- leaflet AV with dilated sinus and root leading to central AR. By quantification and vena contracta only mild to moderate AR RF/RV may be spurious due to concomitant mitral regurgitation 2. Severely fusiform dilatation of the ascending thoracic aorta 4.6 cm 3.  Severe aneurysm dilatation of the main pulmonary artery 5.4 cm 4.  Mild LVE with LVEF 64% 5.  Normal RV size and function RVEF 56% 6.  Moderate bi atrial enlargement 7.  No delayed hyper enhancement on gadolinium images 8.  Mild appearing MR 9.  Moderate PR 10.  Mild TR   Concern that patient may have connective tissue disease given severe aneurysmal dilatation of the ascending thoracic aorta and main pulmonary artery Suggest Possible genetic testing for Loeys-Dietz syndrome or other   Past Medical History:  Diagnosis Date   Acute hypoxic respiratory failure (HCC) 10/01/2022   Acute respiratory failure (HCC) 10/01/2022   Anemia    Aortic atherosclerosis (HCC)    Arthritis    Atrial fibrillation (HCC)    CAD (coronary artery disease)    COVID 2021   mild   Dialysis patient (HCC)    ESRD on hemodialysis (HCC)    MWF at [facility]   GERD (gastroesophageal reflux disease)    Gout    Heart murmur    Hyperparathyroidism (HCC)    Hypertension    in the past, no longer takes medications   Neuromuscular disorder (HCC)    neuropathy   Pneumonia    Seasonal allergies     Past Surgical History:  Procedure Laterality Date    ABDOMINAL AORTOGRAM N/A 08/22/2023   Procedure: ABDOMINAL AORTOGRAM;  Surgeon: Young Hensen, MD;  Location: [facility] INVASIVE CV LAB;  Service: Cardiovascular;  Laterality: N/A;   ABDOMINAL AORTOGRAM W/LOWER EXTREMITY N/A 12/13/2022   Procedure: ABDOMINAL AORTOGRAM W/LOWER EXTREMITY;  Surgeon: Young Hensen, MD;  Location: [facility] LAB;  Service: Cardiovascular;  Laterality: N/A;   CARDIOVERSION N/A 05/16/2021   Procedure: CARDIOVERSION;  Surgeon: Alroy Aspen Lela Purple, MD;  Location: [facility] ENDOSCOPY;  Service: Cardiovascular;  Laterality: N/A;   CARDIOVERSION N/A 08/01/2021   Procedure: CARDIOVERSION;  Surgeon: Luana Rumple, MD;  Location: [facility] ENDOSCOPY;  Service: Cardiovascular;  Laterality: N/A;   CARDIOVERSION N/A 12/05/2021   Procedure: CARDIOVERSION;  Surgeon: Jerryl Morin, DO;  Location: [facility] ENDOSCOPY;  Service: Cardiovascular;  Laterality: N/A;   DIALYSIS FISTULA CREATION Left    LOWER EXTREMITY ANGIOGRAPHY N/A 08/22/2023   Procedure: Lower Extremity Angiography;  Surgeon: Young Hensen, MD;  Location: [facility] INVASIVE CV LAB;  Service: Cardiovascular;  Laterality: N/A;   LOWER EXTREMITY INTERVENTION N/A 08/22/2023   Procedure: LOWER EXTREMITY  INTERVENTION;  Surgeon: Young Hensen, MD;  Location: [facility] INVASIVE CV LAB;  Service: Cardiovascular;  Laterality: N/A;   PERICARDIOCENTESIS N/A 10/02/2022   Procedure: PERICARDIOCENTESIS;  Surgeon: Odie Benne, MD;  [facility];  Service: Cardiovascular;  Laterality: N/A;   PERIPHERAL INTRAVASCULAR LITHOTRIPSY  08/22/2023   Procedure: PERIPHERAL INTRAVASCULAR LITHOTRIPSY;  Surgeon: Young Hensen, MD;  [facility];  Service: Cardiovascular;;   REVISON OF ARTERIOVENOUS FISTULA Left 02/16/2022   Procedure: LEFT ARM ARTERIOVENOUS FISTULA REVISION;  Surgeon: Carlene Che, MD;  Location: [facility] OR;  Service: Vascular;  Laterality: Left;  PERIPHERAL NERVE BLOCK   TUBAL LIGATION       MEDICATIONS:  0.9 %  sodium chloride  infusion   sodium chloride  flush (NS) 0.9 % injection 3 mL    acetaminophen -codeine (TYLENOL  #2) 300-15 MG tablet   amiodarone  (PACERONE ) 200 MG tablet   apixaban  (ELIQUIS ) 5 MG TABS tablet   atorvastatin  (LIPITOR) 20 MG tablet   cilostazol  (PLETAL ) 50 MG tablet   cinacalcet (SENSIPAR) 60 MG tablet   clopidogrel  (PLAVIX ) 75 MG tablet   doxycycline  (VIBRAMYCIN ) 50 MG capsule   ferric citrate  (AURYXIA ) 1 GM 210 MG(Fe) tablet   HYDROcodone -acetaminophen  (NORCO/VICODIN) 5-325 MG tablet   multivitamin (RENA-VIT) TABS tablet   She is not currently taking doxycycline .    Ella Gun, PA-C Surgical Short Stay/Anesthesiology [facility] Phone [phone] [facility] Phone [phone] 09/24/2023 2:45 PM

## 2023-09-24 NOTE — Anesthesia Preprocedure Evaluation (Signed)
 Anesthesia Evaluation  Patient identified by MRN, date of birth, ID band Patient awake    Reviewed: Allergy & Precautions, NPO status , Patient's Chart, lab work & pertinent test results  Airway Mallampati: III  TM Distance: >3 FB Neck ROM: Full    Dental  (+) Loose,    Pulmonary former smoker   Pulmonary exam normal        Cardiovascular hypertension, + CAD  + dysrhythmias Atrial Fibrillation + Valvular Problems/Murmurs  Rhythm:Regular Rate:Normal  Echo:  1. Left ventricular ejection fraction, by estimation, is 60 to 65%. The  left ventricle has normal function. The left ventricle has no regional  wall motion abnormalities. There is mild concentric left ventricular  hypertrophy. Indeterminate diastolic  filling due to E-A fusion.   2. Right ventricular systolic function is moderately reduced. The right  ventricular size is mildly enlarged. Mildly increased right ventricular  wall thickness. There is normal pulmonary artery systolic pressure. The  estimated right ventricular systolic  pressure is 35.6 mmHg.   3. Left atrial size was moderately dilated.   4. Right atrial size was moderately dilated.   5. There is marked cystic change of the left lobe of the liver and there  is evidence of severe polycystic changes of the neighboring kidney tissue.  Large pericardial effusion. Findings are consistent with cardiac  tamponade.   6. The mitral valve is normal in structure. Trivial mitral valve  regurgitation. No evidence of mitral stenosis.   7. The aortic valve is tricuspid. There is mild calcification of the  aortic valve. Aortic valve regurgitation is moderate. Aortic valve  sclerosis is present, with no evidence of aortic valve stenosis.   8. Pulmonic valve regurgitation is moderate.   9. Aortic dilatation noted. There is mild dilatation of the aortic root,  measuring 41 mm. There is moderate dilatation of the ascending  aorta,  measuring 47 mm.  10. The inferior vena cava is dilated in size with <50% respiratory  variability, suggesting right atrial pressure of 15 mmHg.     Neuro/Psych  Neuromuscular disease  negative psych ROS   GI/Hepatic Neg liver ROS,GERD  ,,  Endo/Other  negative endocrine ROS    Renal/GU Dialysis and ESRFRenal disease     Musculoskeletal  (+) Arthritis ,    Abdominal   Peds  Hematology  (+) Blood dyscrasia, anemia   Anesthesia Other Findings   Reproductive/Obstetrics                             Anesthesia Physical Anesthesia Plan  ASA: 3  Anesthesia Plan: General   Post-op Pain Management: Ofirmev  IV (intra-op)*   Induction: Intravenous  PONV Risk Score and Plan: 4 or greater and Ondansetron , Dexamethasone and Treatment may vary due to age or medical condition  Airway Management Planned: Oral ETT  Additional Equipment: Arterial line  Intra-op Plan:   Post-operative Plan: Extubation in OR  Informed Consent: I have reviewed the patients History and Physical, chart, labs and discussed the procedure including the risks, benefits and alternatives for the proposed anesthesia with the patient or authorized representative who has indicated his/her understanding and acceptance.     Dental advisory given  Plan Discussed with: CRNA  Anesthesia Plan Comments: (PAT note written 09/24/2023 by Amayia Ciano, PA-C. Same day work-up. Hemodialysis 09/23/23. 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 Hospitalized 09/2022 for acute respiratory failure  due to large pericardia effusion, s/p pericardiocentesis. Last lung nodule imaging 09/2022. PCP is with [facility]. Cardiologist is Dr. Alroy Aspen.   Echo (Limited Post-Pericardiocentesis) 10/02/22: Conclusion(s)/Recommendation(s): Periprocedural echo for  pericardiocentesis. The postprocedural images show a small residual  effusion and  resolution of RV and RA diastolic collapse and reduction in  heart rate. There is a small residual pericardial  effusion anterolateral to the left ventricle. Fibrinous strands are seen  in that area and the effusion may be partially loculated. The vast  majority of the effusion is successfully drained.   FINDINGS  Aortic Valve: Aortic regurgitation PHT measures 503 msec.    TTE (Pre-Pericardiocentesis) 10/02/22: IMPRESSIONS  1. Left ventricular ejection fraction, by estimation, is 60 to 65%. The  left ventricle has normal function. The left ventricle has no regional  wall motion abnormalities. There is mild concentric left ventricular  hypertrophy. Indeterminate diastolic filling due to E-A fusion.  2. Right ventricular systolic function is moderately reduced. The right ventricular size is mildly enlarged. Mildly increased right ventricular wall thickness. There is normal pulmonary artery systolic pressure. The estimated right ventricular systolic  pressure is 35.6 mmHg.  3. Left atrial size was moderately dilated.  4. Right atrial size was moderately dilated.  5. There is marked cystic change of the left lobe of the liver and there is evidence of severe polycystic changes of the neighboring kidney tissue. Large pericardial effusion. Findings are consistent with cardiac tamponade.  6. The mitral valve is normal in structure. Trivial mitral valve  regurgitation. No evidence of mitral stenosis.  7. The aortic valve is tricuspid. There is mild calcification of the  aortic valve. Aortic valve regurgitation is moderate. Aortic valve  sclerosis is present, with no evidence of aortic valve stenosis.  8. Pulmonic valve regurgitation is moderate.  9. Aortic dilatation noted. There is mild dilatation of the aortic root,  measuring 41 mm. There is moderate dilatation of the ascending aorta, measuring 47 mm.  10. The inferior vena cava is dilated in size with <50% respiratory  variability,  suggesting right atrial pressure of 15 mmHg.  - Comparison(s): Prior images reviewed side by side. Changes from prior study are noted. There is a new large pericardial effusion with echo evidence of tamponade. Otherwise no change from the 07/04/2021 study.    MRI Cardiac 08/22/21: IMPRESSION: 1 Tri- leaflet AV with dilated sinus and root leading to central AR. By quantification and vena contracta only mild to moderate AR RF/RV may be spurious due to concomitant mitral regurgitation 2. Severely fusiform dilatation of the ascending thoracic aorta 4.6 cm 3.  Severe aneurysm dilatation of the main pulmonary artery 5.4 cm 4.  Mild LVE with LVEF 64% 5.  Normal RV size and function RVEF 56% 6.  Moderate bi atrial enlargement 7.  No delayed hyper enhancement on gadolinium images 8.  Mild appearing MR 9.  Moderate PR 10.  Mild TR  Concern that patient may have connective tissue disease given severe aneurysmal dilatation of the ascending thoracic aorta and main pulmonary artery Suggest Possible genetic testing for Loeys-Dietz syndrome or other   )       Anesthesia Quick Evaluation

## 2023-09-24 NOTE — Progress Notes (Addendum)
 SDW call  Patient was given pre-op instructions over the phone. Patient verbalized understanding of instructions provided.  Patient goes to dialysis M, W, F     PCP - Maryellen Snare, NP Cardiologist - Dr. Ahmad Alert Pulmonary:    PPM/ICD - denies Device Orders - na Rep Notified - na   Chest x-ray - 10/02/2022 EKG -  09/13/2022 Stress Test -10/02/2022 ECHO -  Cardiac Cath -   Sleep Study/sleep apnea/CPAP: denies  Non-diabetic  Blood Thinner Instructions: Eliquis , instructed by Dr. Roland Cleveland office to stop 07/22/2023. Hold Plavix  per Dany Dyke, RN at Dr. Lovina Ruddle office, patients last dose was yesterday 09/23/2023 Aspirin  Instructions:denies   ERAS Protcol - NPO   Anesthesia review: Yes.  A-fib, CAD, HTN, ESRD with dialysis   Patient denies shortness of breath, fever, cough and chest pain over the phone call  Your procedure is scheduled on Wednesday Sep 25, 2023  Report to [facility] Main Entrance "A" at  0630  A.M., then check in with the Admitting office.  Call this number if you have problems the morning of surgery:  [phone]   If you have any questions prior to your surgery date call [phone]: Open Monday-Friday 8am-4pm If you experience any cold or flu symptoms such as cough, fever, chills, shortness of breath, etc. between now and your scheduled surgery, please notify us  at the above number    Remember:  Do not eat or drink  after midnight the night before your surgery  Take these medicines the morning of surgery with A SIP OF WATER:  Pletal , plavix   As needed: Tylenol , norco  As of today, STOP taking any Aspirin  (unless otherwise instructed by your surgeon) Aleve, Naproxen, Ibuprofen, Motrin, Advil, Goody's, BC's, all herbal medications, fish oil, and all vitamins.

## 2023-09-25 ENCOUNTER — Other Ambulatory Visit: Payer: Self-pay

## 2023-09-25 ENCOUNTER — Encounter (HOSPITAL_COMMUNITY)
Admission: RE | Disposition: A | Discharge status: Home Health | Payer: Self-pay | Source: Home / Self Care | Attending: Vascular Surgery

## 2023-09-25 ENCOUNTER — Inpatient Hospital Stay (HOSPITAL_COMMUNITY): Admitting: Vascular Surgery

## 2023-09-25 ENCOUNTER — Encounter (HOSPITAL_COMMUNITY): Payer: Self-pay | Admitting: Vascular Surgery

## 2023-09-25 ENCOUNTER — Inpatient Hospital Stay (HOSPITAL_COMMUNITY)
Admission: RE | Admit: 2023-09-25 | Discharge: 2023-09-30 | DRG: 252 | Disposition: A | Discharge status: Home Health | Attending: Vascular Surgery | Admitting: Vascular Surgery

## 2023-09-25 DIAGNOSIS — I70222 Atherosclerosis of native arteries of extremities with rest pain, left leg: Secondary | ICD-10-CM

## 2023-09-25 DIAGNOSIS — N186 End stage renal disease: Secondary | ICD-10-CM | POA: Diagnosis present

## 2023-09-25 DIAGNOSIS — I7121 Aneurysm of the ascending aorta, without rupture: Secondary | ICD-10-CM | POA: Diagnosis present

## 2023-09-25 DIAGNOSIS — L97529 Non-pressure chronic ulcer of other part of left foot with unspecified severity: Secondary | ICD-10-CM | POA: Diagnosis not present

## 2023-09-25 DIAGNOSIS — M109 Gout, unspecified: Secondary | ICD-10-CM | POA: Diagnosis present

## 2023-09-25 DIAGNOSIS — I10 Essential (primary) hypertension: Secondary | ICD-10-CM | POA: Diagnosis not present

## 2023-09-25 DIAGNOSIS — K219 Gastro-esophageal reflux disease without esophagitis: Secondary | ICD-10-CM | POA: Diagnosis present

## 2023-09-25 DIAGNOSIS — N2581 Secondary hyperparathyroidism of renal origin: Secondary | ICD-10-CM | POA: Diagnosis present

## 2023-09-25 DIAGNOSIS — I4891 Unspecified atrial fibrillation: Secondary | ICD-10-CM

## 2023-09-25 DIAGNOSIS — Z992 Dependence on renal dialysis: Secondary | ICD-10-CM

## 2023-09-25 DIAGNOSIS — Z716 Tobacco abuse counseling: Secondary | ICD-10-CM

## 2023-09-25 DIAGNOSIS — M7989 Other specified soft tissue disorders: Secondary | ICD-10-CM | POA: Diagnosis present

## 2023-09-25 DIAGNOSIS — I700 Atherosclerosis of aorta: Secondary | ICD-10-CM | POA: Diagnosis present

## 2023-09-25 DIAGNOSIS — I3139 Other pericardial effusion (noninflammatory): Secondary | ICD-10-CM | POA: Diagnosis present

## 2023-09-25 DIAGNOSIS — I480 Paroxysmal atrial fibrillation: Secondary | ICD-10-CM | POA: Diagnosis present

## 2023-09-25 DIAGNOSIS — L97521 Non-pressure chronic ulcer of other part of left foot limited to breakdown of skin: Secondary | ICD-10-CM | POA: Diagnosis present

## 2023-09-25 DIAGNOSIS — Q613 Polycystic kidney, unspecified: Secondary | ICD-10-CM

## 2023-09-25 DIAGNOSIS — J90 Pleural effusion, not elsewhere classified: Secondary | ICD-10-CM | POA: Diagnosis present

## 2023-09-25 DIAGNOSIS — Z880 Allergy status to penicillin: Secondary | ICD-10-CM

## 2023-09-25 DIAGNOSIS — I2510 Atherosclerotic heart disease of native coronary artery without angina pectoris: Secondary | ICD-10-CM | POA: Diagnosis present

## 2023-09-25 DIAGNOSIS — I70245 Atherosclerosis of native arteries of left leg with ulceration of other part of foot: Secondary | ICD-10-CM | POA: Diagnosis not present

## 2023-09-25 DIAGNOSIS — Z95828 Presence of other vascular implants and grafts: Principal | ICD-10-CM

## 2023-09-25 DIAGNOSIS — D631 Anemia in chronic kidney disease: Secondary | ICD-10-CM | POA: Diagnosis present

## 2023-09-25 DIAGNOSIS — Z7901 Long term (current) use of anticoagulants: Secondary | ICD-10-CM | POA: Diagnosis not present

## 2023-09-25 DIAGNOSIS — J9811 Atelectasis: Secondary | ICD-10-CM | POA: Diagnosis present

## 2023-09-25 DIAGNOSIS — Z87891 Personal history of nicotine dependence: Secondary | ICD-10-CM | POA: Diagnosis not present

## 2023-09-25 DIAGNOSIS — Z8616 Personal history of COVID-19: Secondary | ICD-10-CM | POA: Diagnosis not present

## 2023-09-25 DIAGNOSIS — Z833 Family history of diabetes mellitus: Secondary | ICD-10-CM

## 2023-09-25 DIAGNOSIS — R911 Solitary pulmonary nodule: Secondary | ICD-10-CM | POA: Diagnosis present

## 2023-09-25 DIAGNOSIS — Z91013 Allergy to seafood: Secondary | ICD-10-CM

## 2023-09-25 DIAGNOSIS — K8020 Calculus of gallbladder without cholecystitis without obstruction: Secondary | ICD-10-CM | POA: Diagnosis present

## 2023-09-25 LAB — COMPREHENSIVE METABOLIC PANEL WITH GFR
ALT: 12 U/L (ref 0–44)
AST: 19 U/L (ref 15–41)
Albumin: 3.8 g/dL (ref 3.5–5.0)
Alkaline Phosphatase: 141 U/L — ABNORMAL HIGH (ref 38–126)
Anion gap: 17 — ABNORMAL HIGH (ref 5–15)
BUN: 42 mg/dL — ABNORMAL HIGH (ref 8–23)
CO2: 28 mmol/L (ref 22–32)
Calcium: 10.3 mg/dL (ref 8.9–10.3)
Chloride: 93 mmol/L — ABNORMAL LOW (ref 98–111)
Creatinine, Ser: 8.39 mg/dL — ABNORMAL HIGH (ref 0.44–1.00)
GFR, Estimated: 5 mL/min — ABNORMAL LOW (ref >60)
Glucose, Bld: 80 mg/dL (ref 70–99)
Potassium: 4 mmol/L (ref 3.5–5.1)
Sodium: 138 mmol/L (ref 135–145)
Total Bilirubin: 0.7 mg/dL (ref 0.0–1.2)
Total Protein: 7.5 g/dL (ref 6.5–8.1)

## 2023-09-25 LAB — CBC
HCT: 41.7 % (ref 36.0–46.0)
Hemoglobin: 12.9 g/dL (ref 12.0–15.0)
MCH: 29.7 pg (ref 26.0–34.0)
MCHC: 30.9 g/dL (ref 30.0–36.0)
MCV: 96.1 fL (ref 80.0–100.0)
Platelets: 164 10*3/uL (ref 150–400)
RBC: 4.34 MIL/uL (ref 3.87–5.11)
RDW: 15.7 % — ABNORMAL HIGH (ref 11.5–15.5)
WBC: 4.4 10*3/uL (ref 4.0–10.5)
nRBC: 0 % (ref 0.0–0.2)

## 2023-09-25 LAB — POCT I-STAT, CHEM 8
BUN: 48 mg/dL — ABNORMAL HIGH (ref 8–23)
Calcium, Ion: 1.08 mmol/L — ABNORMAL LOW (ref 1.15–1.40)
Chloride: 95 mmol/L — ABNORMAL LOW (ref 98–111)
Creatinine, Ser: 8.3 mg/dL — ABNORMAL HIGH (ref 0.44–1.00)
Glucose, Bld: 75 mg/dL (ref 70–99)
HCT: 44 % (ref 36.0–46.0)
Hemoglobin: 15 g/dL (ref 12.0–15.0)
Potassium: 4 mmol/L (ref 3.5–5.1)
Sodium: 135 mmol/L (ref 135–145)
TCO2: 31 mmol/L (ref 22–32)

## 2023-09-25 LAB — PROTIME-INR
INR: 1.2 (ref 0.8–1.2)
Prothrombin Time: 15.6 s — ABNORMAL HIGH (ref 11.4–15.2)

## 2023-09-25 LAB — APTT: aPTT: 36 s (ref 24–36)

## 2023-09-25 LAB — SURGICAL PCR SCREEN
MRSA, PCR: NEGATIVE
Staphylococcus aureus: NEGATIVE

## 2023-09-25 LAB — ABO/RH: ABO/RH(D): B POS

## 2023-09-25 LAB — TYPE AND SCREEN
ABO/RH(D): B POS
Antibody Screen: NEGATIVE

## 2023-09-25 SURGERY — BYPASS GRAFT FEMORAL-POPLITEAL ARTERY
Anesthesia: General | Laterality: Left

## 2023-09-25 MED ORDER — HYDROMORPHONE HCL 1 MG/ML IJ SOLN
0.2500 mg | INTRAMUSCULAR | Status: DC | PRN
  Administered 2023-09-25: 0.25 mg via INTRAVENOUS

## 2023-09-25 MED ORDER — OXYCODONE HCL 5 MG PO TABS
5.0000 mg | ORAL_TABLET | ORAL | Status: DC | PRN
  Administered 2023-09-26 – 2023-09-29 (×3): 5 mg via ORAL
  Filled 2023-09-25 (×3): qty 1

## 2023-09-25 MED ORDER — MUPIROCIN 2 % EX OINT: 1.0000 | TOPICAL_OINTMENT | Freq: Once | CUTANEOUS | Status: AC

## 2023-09-25 MED ORDER — ACETAMINOPHEN 10 MG/ML IV SOLN: 1000.0000 mg | Freq: Once | INTRAVENOUS | Status: DC | PRN

## 2023-09-25 MED ORDER — ACETAMINOPHEN 325 MG PO TABS
325.0000 mg | ORAL_TABLET | ORAL | Status: DC | PRN
  Administered 2023-09-26: 650 mg via ORAL
  Administered 2023-09-26: 325 mg via ORAL
  Administered 2023-09-27: 650 mg via ORAL
  Filled 2023-09-25 (×2): qty 2

## 2023-09-25 MED ORDER — ONDANSETRON HCL 4 MG/2ML IJ SOLN
INTRAMUSCULAR | Status: DC | PRN
  Administered 2023-09-25: 4 mg via INTRAVENOUS

## 2023-09-25 MED ORDER — EPHEDRINE SULFATE-NACL 50-0.9 MG/10ML-% IV SOSY
PREFILLED_SYRINGE | INTRAVENOUS | Status: DC | PRN
  Administered 2023-09-25: 10 mg via INTRAVENOUS

## 2023-09-25 MED ORDER — FENTANYL CITRATE (PF) 250 MCG/5ML IJ SOLN
INTRAMUSCULAR | Status: DC | PRN
  Administered 2023-09-25 (×4): 50 ug via INTRAVENOUS

## 2023-09-25 MED ORDER — ACETAMINOPHEN 10 MG/ML IV SOLN
INTRAVENOUS | Status: DC | PRN
  Administered 2023-09-25: 1000 mg via INTRAVENOUS

## 2023-09-25 MED ORDER — HYDROMORPHONE HCL 1 MG/ML IJ SOLN
INTRAMUSCULAR | Status: AC
Start: 2023-09-25 — End: ?
  Filled 2023-09-25: qty 1

## 2023-09-25 MED ORDER — PROPOFOL 10 MG/ML IV BOLUS
INTRAVENOUS | Status: AC
  Filled 2023-09-25: qty 20

## 2023-09-25 MED ORDER — PANTOPRAZOLE SODIUM 40 MG PO TBEC
40.0000 mg | DELAYED_RELEASE_TABLET | Freq: Every day | ORAL | Status: DC
Start: 2023-09-25 — End: 2023-09-30
  Administered 2023-09-27 – 2023-09-30 (×4): 40 mg via ORAL
  Filled 2023-09-25 (×5): qty 1

## 2023-09-25 MED ORDER — FERRIC CITRATE 1 GM 210 MG(FE) PO TABS
210.0000 mg | ORAL_TABLET | Freq: Three times a day (TID) | ORAL | Status: DC
  Administered 2023-09-26 – 2023-09-30 (×8): 210 mg via ORAL
  Filled 2023-09-25 (×17): qty 1

## 2023-09-25 MED ORDER — MORPHINE SULFATE (PF) 2 MG/ML IV SOLN: 1.0000 mg | INTRAVENOUS | Status: DC | PRN

## 2023-09-25 MED ORDER — HEPARIN SODIUM (PORCINE) 5000 UNIT/ML IJ SOLN
5000.0000 [IU] | Freq: Three times a day (TID) | INTRAMUSCULAR | Status: DC
  Administered 2023-09-26: 5000 [IU] via SUBCUTANEOUS
  Filled 2023-09-25 (×2): qty 1

## 2023-09-25 MED ORDER — PROTAMINE SULFATE 10 MG/ML IV SOLN
INTRAVENOUS | Status: AC
  Filled 2023-09-25: qty 25

## 2023-09-25 MED ORDER — ACETAMINOPHEN 325 MG RE SUPP: 325.0000 mg | RECTAL | Status: DC | PRN

## 2023-09-25 MED ORDER — ACETAMINOPHEN 10 MG/ML IV SOLN
INTRAVENOUS | Status: AC
  Filled 2023-09-25: qty 100

## 2023-09-25 MED ORDER — SODIUM CHLORIDE 0.9 % IV SOLN: INTRAVENOUS | Status: DC

## 2023-09-25 MED ORDER — GUAIFENESIN-DM 100-10 MG/5ML PO SYRP: 15.0000 mL | ORAL_SOLUTION | ORAL | Status: DC | PRN

## 2023-09-25 MED ORDER — SODIUM CHLORIDE 0.9 % IV SOLN
500.0000 mL | Freq: Once | INTRAVENOUS | Status: AC | PRN
  Administered 2023-09-25: 500 mL via INTRAVENOUS

## 2023-09-25 MED ORDER — LACTATED RINGERS IV SOLN: INTRAVENOUS | Status: DC

## 2023-09-25 MED ORDER — CHLORHEXIDINE GLUCONATE CLOTH 2 % EX PADS
6.0000 | MEDICATED_PAD | Freq: Every day | CUTANEOUS | Status: DC
  Administered 2023-09-26: 6 via TOPICAL

## 2023-09-25 MED ORDER — CEFAZOLIN SODIUM-DEXTROSE 2-4 GM/100ML-% IV SOLN: 2.0000 g | Freq: Three times a day (TID) | INTRAVENOUS | Status: DC

## 2023-09-25 MED ORDER — LABETALOL HCL 5 MG/ML IV SOLN: 10.0000 mg | INTRAVENOUS | Status: DC | PRN

## 2023-09-25 MED ORDER — GLYCOPYRROLATE PF 0.2 MG/ML IJ SOSY
PREFILLED_SYRINGE | INTRAMUSCULAR | Status: AC
  Filled 2023-09-25: qty 2

## 2023-09-25 MED ORDER — ROCURONIUM BROMIDE 10 MG/ML (PF) SYRINGE
PREFILLED_SYRINGE | INTRAVENOUS | Status: DC | PRN
  Administered 2023-09-25: 50 mg via INTRAVENOUS
  Administered 2023-09-25 (×2): 10 mg via INTRAVENOUS

## 2023-09-25 MED ORDER — BISACODYL 5 MG PO TBEC: 5.0000 mg | DELAYED_RELEASE_TABLET | Freq: Every day | ORAL | Status: DC | PRN

## 2023-09-25 MED ORDER — GLYCOPYRROLATE 0.2 MG/ML IJ SOLN
INTRAMUSCULAR | Status: DC | PRN
  Administered 2023-09-25: .4 mg via INTRAVENOUS

## 2023-09-25 MED ORDER — PHENYLEPHRINE 80 MCG/ML (10ML) SYRINGE FOR IV PUSH (FOR BLOOD PRESSURE SUPPORT)
PREFILLED_SYRINGE | INTRAVENOUS | Status: DC | PRN
Start: 2023-09-25 — End: 2023-09-25
  Administered 2023-09-25 (×2): 160 ug via INTRAVENOUS

## 2023-09-25 MED ORDER — ONDANSETRON HCL 4 MG/2ML IJ SOLN
4.0000 mg | Freq: Four times a day (QID) | INTRAMUSCULAR | Status: DC | PRN
  Filled 2023-09-25: qty 2

## 2023-09-25 MED ORDER — HEPARIN SODIUM (PORCINE) 1000 UNIT/ML IJ SOLN
INTRAMUSCULAR | Status: DC | PRN
  Administered 2023-09-25: 7000 [IU] via INTRAVENOUS

## 2023-09-25 MED ORDER — LIDOCAINE 2% (20 MG/ML) 5 ML SYRINGE
INTRAMUSCULAR | Status: DC | PRN
  Administered 2023-09-25: 40 mg via INTRAVENOUS

## 2023-09-25 MED ORDER — LIDOCAINE 2% (20 MG/ML) 5 ML SYRINGE
INTRAMUSCULAR | Status: AC
  Filled 2023-09-25: qty 5

## 2023-09-25 MED ORDER — RENA-VITE PO TABS
1.0000 | ORAL_TABLET | Freq: Every day | ORAL | Status: DC
  Filled 2023-09-25 (×4): qty 1

## 2023-09-25 MED ORDER — ASPIRIN 81 MG PO TBEC
81.0000 mg | DELAYED_RELEASE_TABLET | Freq: Every day | ORAL | Status: DC
  Administered 2023-09-26 – 2023-09-30 (×5): 81 mg via ORAL
  Filled 2023-09-25 (×5): qty 1

## 2023-09-25 MED ORDER — HYDRALAZINE HCL 20 MG/ML IJ SOLN: 5.0000 mg | INTRAMUSCULAR | Status: DC | PRN

## 2023-09-25 MED ORDER — POLYETHYLENE GLYCOL 3350 17 G PO PACK: 17.0000 g | PACK | Freq: Every day | ORAL | Status: DC | PRN

## 2023-09-25 MED ORDER — DOCUSATE SODIUM 100 MG PO CAPS
100.0000 mg | ORAL_CAPSULE | Freq: Every day | ORAL | Status: DC
Start: 2023-09-26 — End: 2023-09-30
  Administered 2023-09-26 – 2023-09-30 (×5): 100 mg via ORAL
  Filled 2023-09-25 (×5): qty 1

## 2023-09-25 MED ORDER — HEPARIN SODIUM (PORCINE) 1000 UNIT/ML IJ SOLN
INTRAMUSCULAR | Status: AC
  Filled 2023-09-25: qty 10

## 2023-09-25 MED ORDER — FENTANYL CITRATE (PF) 250 MCG/5ML IJ SOLN
INTRAMUSCULAR | Status: AC
  Filled 2023-09-25: qty 5

## 2023-09-25 MED ORDER — HEPARIN 6000 UNIT IRRIGATION SOLUTION
Status: DC | PRN
  Administered 2023-09-25: 1

## 2023-09-25 MED ORDER — ORAL CARE MOUTH RINSE: 15.0000 mL | Freq: Once | OROMUCOSAL | Status: AC

## 2023-09-25 MED ORDER — CLOPIDOGREL BISULFATE 75 MG PO TABS
75.0000 mg | ORAL_TABLET | Freq: Every evening | ORAL | Status: DC
  Administered 2023-09-26: 75 mg via ORAL
  Filled 2023-09-25: qty 1

## 2023-09-25 MED ORDER — MAGNESIUM SULFATE 2 GM/50ML IV SOLN: 2.0000 g | Freq: Every day | INTRAVENOUS | Status: DC | PRN

## 2023-09-25 MED ORDER — HEPARIN 6000 UNIT IRRIGATION SOLUTION
Status: AC
  Filled 2023-09-25: qty 500

## 2023-09-25 MED ORDER — OXYCODONE HCL 5 MG PO TABS
5.0000 mg | ORAL_TABLET | Freq: Once | ORAL | Status: AC | PRN
  Administered 2023-09-25: 5 mg via ORAL

## 2023-09-25 MED ORDER — POTASSIUM CHLORIDE CRYS ER 20 MEQ PO TBCR: 20.0000 meq | EXTENDED_RELEASE_TABLET | Freq: Every day | ORAL | Status: DC | PRN

## 2023-09-25 MED ORDER — CHLORHEXIDINE GLUCONATE CLOTH 2 % EX PADS: 6.0000 | MEDICATED_PAD | Freq: Once | CUTANEOUS | Status: DC

## 2023-09-25 MED ORDER — MUPIROCIN 2 % EX OINT
TOPICAL_OINTMENT | CUTANEOUS | Status: AC
Start: 2023-09-25 — End: 2023-09-25
  Administered 2023-09-25: 1 via TOPICAL
  Filled 2023-09-25: qty 22

## 2023-09-25 MED ORDER — 0.9 % SODIUM CHLORIDE (POUR BTL) OPTIME
TOPICAL | Status: DC | PRN
  Administered 2023-09-25: 2000 mL

## 2023-09-25 MED ORDER — ATORVASTATIN CALCIUM 10 MG PO TABS
20.0000 mg | ORAL_TABLET | Freq: Every evening | ORAL | Status: DC
  Administered 2023-09-25: 20 mg via ORAL
  Filled 2023-09-25: qty 2

## 2023-09-25 MED ORDER — AMIODARONE HCL 200 MG PO TABS
200.0000 mg | ORAL_TABLET | Freq: Every evening | ORAL | Status: DC
  Administered 2023-09-25 – 2023-09-26 (×2): 200 mg via ORAL
  Filled 2023-09-25 (×2): qty 1

## 2023-09-25 MED ORDER — SODIUM CHLORIDE 0.9 % IV SOLN: INTRAVENOUS | Status: AC

## 2023-09-25 MED ORDER — NEOSTIGMINE METHYLSULFATE 3 MG/3ML IV SOSY
PREFILLED_SYRINGE | INTRAVENOUS | Status: AC
  Filled 2023-09-25: qty 3

## 2023-09-25 MED ORDER — OXYCODONE HCL 5 MG PO TABS
ORAL_TABLET | ORAL | Status: AC
  Filled 2023-09-25: qty 1

## 2023-09-25 MED ORDER — DEXAMETHASONE SODIUM PHOSPHATE 10 MG/ML IJ SOLN
INTRAMUSCULAR | Status: DC | PRN
  Administered 2023-09-25: 5 mg via INTRAVENOUS

## 2023-09-25 MED ORDER — VANCOMYCIN HCL IN DEXTROSE 1-5 GM/200ML-% IV SOLN
1000.0000 mg | INTRAVENOUS | Status: AC
  Administered 2023-09-25: 1000 mg via INTRAVENOUS
  Filled 2023-09-25: qty 200

## 2023-09-25 MED ORDER — NEOSTIGMINE METHYLSULFATE 10 MG/10ML IV SOLN
INTRAVENOUS | Status: DC | PRN
  Administered 2023-09-25: 2 mg via INTRAVENOUS
  Administered 2023-09-25: 1 mg via INTRAVENOUS

## 2023-09-25 MED ORDER — CINACALCET HCL 30 MG PO TABS
60.0000 mg | ORAL_TABLET | Freq: Every day | ORAL | Status: DC
  Administered 2023-09-25 – 2023-09-29 (×5): 60 mg via ORAL
  Filled 2023-09-25 (×6): qty 2

## 2023-09-25 MED ORDER — DROPERIDOL 2.5 MG/ML IJ SOLN: 0.6250 mg | Freq: Once | INTRAMUSCULAR | Status: DC | PRN

## 2023-09-25 MED ORDER — PHENOL 1.4 % MT LIQD: 1.0000 | OROMUCOSAL | Status: DC | PRN

## 2023-09-25 MED ORDER — ROCURONIUM BROMIDE 10 MG/ML (PF) SYRINGE
PREFILLED_SYRINGE | INTRAVENOUS | Status: AC
  Filled 2023-09-25: qty 10

## 2023-09-25 MED ORDER — HEMOSTATIC AGENTS (NO CHARGE) OPTIME
TOPICAL | Status: DC | PRN
  Administered 2023-09-25: 3 via TOPICAL

## 2023-09-25 MED ORDER — METOPROLOL TARTRATE 5 MG/5ML IV SOLN: 2.0000 mg | INTRAVENOUS | Status: DC | PRN

## 2023-09-25 MED ORDER — CHLORHEXIDINE GLUCONATE 0.12 % MT SOLN
15.0000 mL | Freq: Once | OROMUCOSAL | Status: AC
  Administered 2023-09-25: 15 mL via OROMUCOSAL
  Filled 2023-09-25: qty 15

## 2023-09-25 MED ORDER — ALUM & MAG HYDROXIDE-SIMETH 200-200-20 MG/5ML PO SUSP: 15.0000 mL | ORAL | Status: DC | PRN

## 2023-09-25 MED ORDER — PHENYLEPHRINE HCL-NACL 20-0.9 MG/250ML-% IV SOLN
INTRAVENOUS | Status: DC | PRN
  Administered 2023-09-25: 25 ug/min via INTRAVENOUS

## 2023-09-25 MED ORDER — OXYCODONE HCL 5 MG/5ML PO SOLN: 5.0000 mg | Freq: Once | ORAL | Status: AC | PRN

## 2023-09-25 MED ORDER — SUGAMMADEX SODIUM 200 MG/2ML IV SOLN
INTRAVENOUS | Status: DC | PRN
  Administered 2023-09-25: 100 mg via INTRAVENOUS
  Administered 2023-09-25: 50 mg via INTRAVENOUS

## 2023-09-25 MED ORDER — DEXAMETHASONE SODIUM PHOSPHATE 10 MG/ML IJ SOLN
INTRAMUSCULAR | Status: AC
  Filled 2023-09-25: qty 1

## 2023-09-25 MED ORDER — PROPOFOL 10 MG/ML IV BOLUS
INTRAVENOUS | Status: DC | PRN
  Administered 2023-09-25: 120 mg via INTRAVENOUS

## 2023-09-25 MED ORDER — PROTAMINE SULFATE 10 MG/ML IV SOLN
INTRAVENOUS | Status: DC | PRN
Start: 2023-09-25 — End: 2023-09-25
  Administered 2023-09-25: 50 mg via INTRAVENOUS

## 2023-09-25 SURGICAL SUPPLY — 55 items
BAG COUNTER SPONGE SURGICOUNT (BAG) ×1 IMPLANT
BANDAGE ESMARK 6X9 LF (GAUZE/BANDAGES/DRESSINGS) IMPLANT
BENZOIN TINCTURE PRP APPL 2/3 (GAUZE/BANDAGES/DRESSINGS) IMPLANT
BNDG ELASTIC 6X10 VLCR STRL LF (GAUZE/BANDAGES/DRESSINGS) IMPLANT
CANISTER SUCTION 3000ML PPV (SUCTIONS) ×1 IMPLANT
CANNULA VESSEL 3MM 2 BLNT TIP (CANNULA) ×2 IMPLANT
CHLORAPREP W/TINT 26 (MISCELLANEOUS) ×2 IMPLANT
CUFF TOURN SGL QUICK 42 (TOURNIQUET CUFF) IMPLANT
CUFF TRNQT CYL 24X4X16.5-23 (TOURNIQUET CUFF) IMPLANT
CUFF TRNQT CYL 34X4.125X (TOURNIQUET CUFF) IMPLANT
DRAIN PENROSE 12X.25 LTX STRL (MISCELLANEOUS) IMPLANT
DRAPE C-ARM 42X72 X-RAY (DRAPES) IMPLANT
DRAPE HALF SHEET 40X57 (DRAPES) IMPLANT
DRAPE INCISE 23X17 STRL (DRAPES) IMPLANT
DRAPE INCISE IOBAN 23X17 STRL (DRAPES) IMPLANT
DRAPE INCISE IOBAN 66X45 STRL (DRAPES) IMPLANT
DRAPE X-RAY CASS 24X20 (DRAPES) IMPLANT
DRSG TEGADERM 2-3/8X2-3/4 SM (GAUZE/BANDAGES/DRESSINGS) IMPLANT
DRSG TEGADERM 4X4.75 (GAUZE/BANDAGES/DRESSINGS) IMPLANT
ELECTRODE REM PT RTRN 9FT ADLT (ELECTROSURGICAL) ×1 IMPLANT
EVACUATOR SILICONE 100CC (DRAIN) IMPLANT
GAUZE SPONGE 4X4 12PLY STRL (GAUZE/BANDAGES/DRESSINGS) ×1 IMPLANT
GLOVE BIO SURGEON STRL SZ8 (GLOVE) ×1 IMPLANT
GLOVE BIOGEL PI IND STRL 6.5 (GLOVE) IMPLANT
GLOVE BIOGEL PI IND STRL 9 (GLOVE) IMPLANT
GOWN STRL REUS W/ TWL LRG LVL3 (GOWN DISPOSABLE) ×2 IMPLANT
GOWN STRL REUS W/ TWL XL LVL3 (GOWN DISPOSABLE) ×1 IMPLANT
GRAFT PROPATEN W/RING 6X80X60 (Vascular Products) IMPLANT
HEAD CUTTING VALVULOTOME LEMTR (VASCULAR PRODUCTS) IMPLANT
INSERT FOGARTY SM (MISCELLANEOUS) IMPLANT
KIT BASIN OR (CUSTOM PROCEDURE TRAY) ×1 IMPLANT
KIT TURNOVER KIT B (KITS) ×1 IMPLANT
MARKER GRAFT CORONARY BYPASS (MISCELLANEOUS) IMPLANT
NS IRRIG 1000ML POUR BTL (IV SOLUTION) ×2 IMPLANT
PACK PERIPHERAL VASCULAR (CUSTOM PROCEDURE TRAY) ×1 IMPLANT
PAD ARMBOARD POSITIONER FOAM (MISCELLANEOUS) ×2 IMPLANT
SET COLLECT BLD 21X3/4 12 (NEEDLE) IMPLANT
SET WALTER ACTIVATION W/DRAPE (SET/KITS/TRAYS/PACK) IMPLANT
SPONGE T-LAP 18X18 ~~LOC~~+RFID (SPONGE) IMPLANT
STOPCOCK 4 WAY LG BORE MALE ST (IV SETS) IMPLANT
STRIP CLOSURE SKIN 1/2X4 (GAUZE/BANDAGES/DRESSINGS) IMPLANT
SUT ETHILON 3 0 PS 1 (SUTURE) IMPLANT
SUT MNCRL AB 4-0 PS2 18 (SUTURE) ×2 IMPLANT
SUT PROLENE 5 0 C 1 24 (SUTURE) ×1 IMPLANT
SUT PROLENE 6 0 BV (SUTURE) ×1 IMPLANT
SUT SILK 2 0 SH (SUTURE) ×1 IMPLANT
SUT SILK 3-0 18XBRD TIE 12 (SUTURE) IMPLANT
SUT VIC AB 2-0 CT1 TAPERPNT 27 (SUTURE) ×2 IMPLANT
SUT VIC AB 3-0 SH 27X BRD (SUTURE) ×2 IMPLANT
TAPE UMBILICAL 1/8X30 (MISCELLANEOUS) IMPLANT
TOWEL GREEN STERILE (TOWEL DISPOSABLE) ×1 IMPLANT
TRAY FOLEY MTR SLVR 16FR STAT (SET/KITS/TRAYS/PACK) ×1 IMPLANT
UNDERPAD 30X36 HEAVY ABSORB (UNDERPADS AND DIAPERS) ×1 IMPLANT
VALVULOTOME HEAD CUTTING LEMTR (VASCULAR PRODUCTS) IMPLANT
WATER STERILE IRR 1000ML POUR (IV SOLUTION) ×1 IMPLANT

## 2023-09-25 NOTE — Progress Notes (Signed)
 PHARMACY NOTE:  ANTIMICROBIAL RENAL DOSAGE ADJUSTMENT  Current antimicrobial regimen includes a mismatch between antimicrobial dosage and estimated renal function.  As per policy approved by the Pharmacy & Therapeutics and Medical Executive Committees, the antimicrobial dosage will be adjusted accordingly.  Current antimicrobial dosage:  cefaz 2g IV q8 x2  Indication: Post surgical prophylaxis  Renal Function:  Estimated Creatinine Clearance: 5.4 mL/min (A) (by C-G formula based on SCr of 8.3 mg/dL (H)). [x]      On intermittent HD, scheduled: []      On CRRT    Antimicrobial dosage has been changed to:  none  Additional comments: Pre-op vanc will cover >24 hrs

## 2023-09-25 NOTE — Consult Note (Signed)
 Renal Service Consult Note [facility]  Gloria Gallagher 09/25/2023 [name], MD Requesting Physician: Dr. Edgardo Goodwill  Reason for Consult: ESRD patient status post surgical left lower extremity revascularization surgery HPI: The patient is a 73 y.o. year-old w/ PMH as below who presented for elective surgery due to distal ischemia of the left leg.  Dr. Zoila Hines performed a left femoral endarterectomy and common femoral to below the knee popliteal artery which was completed this morning, 09/25/2023.  Patient is to be admitted.  We are asked to see for dialysis   Pt seen in PACU.  She has no complaints at this time.  She does dialysis on Monday Wednesday Friday on [facility].  Left upper arm AV fistula working well.  No recent issues with dialysis or access.  Not on home oxygen .   ROS - denies CP, no joint pain, no HA, no blurry vision, no rash, no diarrhea, no nausea/ vomiting  PMH: Acute hypoxic respiratory failure Anemia Atrial fibrillation CAD ESRD on hemodialysis Gout GERD HTN Peripheral neuropathy  Past Surgical History  Past Surgical History:  Procedure Laterality Date   ABDOMINAL AORTOGRAM N/A 08/22/2023   Procedure: ABDOMINAL AORTOGRAM;  Surgeon: Young Hensen, MD;  Location: [facility] INVASIVE CV LAB;  Service: Cardiovascular;  Laterality: N/A;   ABDOMINAL AORTOGRAM W/LOWER EXTREMITY N/A 12/13/2022   Procedure: ABDOMINAL AORTOGRAM W/LOWER EXTREMITY;  Surgeon: Young Hensen, MD;  Location: [facility] INVASIVE CV LAB;  Service: Cardiovascular;  Laterality: N/A;   CARDIOVERSION N/A 05/16/2021   Procedure: CARDIOVERSION;  Surgeon: Alroy Aspen Lela Purple, MD;  Location: [facility] ENDOSCOPY;  Service: Cardiovascular;  Laterality: N/A;   CARDIOVERSION N/A 08/01/2021   Procedure: CARDIOVERSION;  Surgeon: Luana Rumple, MD;  Location: [facility] ENDOSCOPY;  Service: Cardiovascular;  Laterality: N/A;   CARDIOVERSION N/A 12/05/2021   Procedure: CARDIOVERSION;  Surgeon: Jerryl Morin, DO;  Location: [facility] ENDOSCOPY;  Service: Cardiovascular;  Laterality: N/A;   DIALYSIS FISTULA CREATION Left    LOWER EXTREMITY ANGIOGRAPHY N/A 08/22/2023   Procedure: Lower Extremity Angiography;  Surgeon: Young Hensen, MD;  Location: [facility] INVASIVE CV LAB;  Service: Cardiovascular;  Laterality: N/A;   LOWER EXTREMITY INTERVENTION N/A 08/22/2023   Procedure: LOWER EXTREMITY INTERVENTION;  Surgeon: Young Hensen, MD;  Location: [facility] INVASIVE CV LAB;  Service: Cardiovascular;  Laterality: N/A;   PERICARDIOCENTESIS N/A 10/02/2022   Procedure: PERICARDIOCENTESIS;  Surgeon: Odie Benne, MD;  Location: [facility] INVASIVE CV LAB;  Service: Cardiovascular;  Laterality: N/A;   PERIPHERAL INTRAVASCULAR LITHOTRIPSY  08/22/2023   Procedure: PERIPHERAL INTRAVASCULAR LITHOTRIPSY;  Surgeon: Young Hensen, MD;  Location: [facility] INVASIVE CV LAB;  Service: Cardiovascular;;   REVISON OF ARTERIOVENOUS FISTULA Left 02/16/2022   Procedure: LEFT ARM ARTERIOVENOUS FISTULA REVISION;  Surgeon: Carlene Che, MD;  Location: [facility] OR;  Service: Vascular;  Laterality: Left;  PERIPHERAL NERVE BLOCK   TUBAL LIGATION     Family History  Family History  Problem Relation Age of Onset   Diabetes Sister    Stomach cancer Neg Hx    Colon cancer Neg Hx    Social History  reports that she has quit smoking. Her smoking use included cigarettes. She started smoking about 21 years ago. She has never been exposed to tobacco smoke. She has never used smokeless tobacco. She reports that she does not currently use alcohol after a past usage of about 1.0 standard drink of alcohol per week. She reports that she does not use drugs. Allergies  Allergies  Allergen Reactions  Penicillin G Hives and Itching   Shrimp (Diagnostic) Hives   Home medications Prior to Admission medications   Medication Sig Start Date End Date Taking? Authorizing Provider  acetaminophen -codeine (TYLENOL  #2) 300-15 MG tablet Take 1 tablet by  mouth every 4 (four) hours as needed for moderate pain (pain score 4-6).   Yes [provider]  amiodarone  (PACERONE ) 200 MG tablet TAKE 1 TABLET BY MOUTH DAILY Patient taking differently: Take 200 mg by mouth every evening. 11/22/22  Yes Nahser, Lela Purple, MD  atorvastatin  (LIPITOR) 20 MG tablet Take 20 mg by mouth every evening. 12/05/21  Yes [provider]  cilostazol  (PLETAL ) 50 MG tablet Take 1 tablet (50 mg total) by mouth 2 (two) times daily. 07/02/23  Yes Butch Cashing, PA-C  cinacalcet (SENSIPAR) 60 MG tablet Take 60 mg by mouth at bedtime.   Yes [provider]  clopidogrel  (PLAVIX ) 75 MG tablet Take 1 tablet (75 mg total) by mouth daily. Patient taking differently: Take 75 mg by mouth every evening. 08/22/23 08/21/24 Yes Young Hensen, MD  ferric citrate  (AURYXIA ) 1 GM 210 MG(Fe) tablet Take 1 tablet (210 mg total) by mouth 3 (three) times daily with meals. Patient taking differently: Take 630 mg by mouth 3 (three) times daily with meals. 01/19/22  Yes Ernestina Headland, MD  HYDROcodone -acetaminophen  (NORCO/VICODIN) 5-325 MG tablet Take 1 tablet by mouth every 6 (six) hours as needed for moderate pain (pain score 4-6).   Yes [provider]  multivitamin (RENA-VIT) TABS tablet Take 1 tablet by mouth at bedtime. 10/05/22  Yes Maxwell, Allee, MD  apixaban  (ELIQUIS ) 5 MG TABS tablet TAKE 1 TABLET BY MOUTH TWICE  DAILY 09/05/23   Nahser, Lela Purple, MD  doxycycline  (VIBRAMYCIN ) 50 MG capsule Take 100 mg by mouth 2 (two) times daily. Patient not taking: Reported on 09/19/2023    [provider]     Vitals:   09/25/23 1615 09/25/23 1630 09/25/23 1645 09/25/23 1715  BP: 110/62 118/69 105/70 125/73  Pulse: 68 75 64 94  Resp: 15 17 (!) 22 17  Temp:      TempSrc:      SpO2: 99% 97% 93% 99%  Weight:      Height:       Exam Gen alert, no distress, postop in PACU, on nasal cannula O2 No rash, cyanosis or gangrene Sclera anicteric, throat clear  No  jvd or bruits Chest clear bilat to bases, no rales/ wheezing RRR no MRG Abd soft ntnd no mass or ascites +bs GU  MS no joint effusions or deformity Ext 1+ left lower extremity edema, no right lower extremity edema Neuro is alert, Ox 3 , nf    LUA aVF + bruit       Renal-related home meds: Sensipar 60 mg at bedtime Auryxia  3 AC 3 times daily Rena-Vite 1 daily Others: Norco as needed, Plavix , Lipitor, Eliquis , amiodarone     OP HD: [facility] M[facility] 3h  B400   66.5kg   L AVF   Heparin  3000    Assessment/ Plan: Ischemic left lower extremity: S/P surgical revascularization by VVS on 09/25/23. ESRD: on HD MWF.  Patient is stable, no lab or volume issues, will plan next dialysis off schedule tomorrow morning. BP: does not have HTN, BP's here low-normal.  Volume: mild vol excess w/ L > R LE edema. Plan 2.5 L UF w/ next HD.  Anemia of esrd: Hb 12-13, follow  Secondary hyperparathyroidism: CCa in range, add on phos,  cont binders ac.     Larry Poag  MD [facility] 09/25/2023, 5:45 PM  Recent Labs  Lab 09/19/23 1455 09/25/23 0717 09/25/23 0743  HGB 13.0 12.9 15.0  ALBUMIN   --  3.8  --   CALCIUM  9.7 10.3  --   CREATININE 7.56* 8.39* 8.30*  K 3.9 4.0 4.0   Inpatient medications:  amiodarone   200 mg Oral QPM   [START ON 09/26/2023] aspirin  EC  81 mg Oral Daily   atorvastatin   20 mg Oral QPM   cinacalcet  60 mg Oral QHS   [START ON 09/26/2023] clopidogrel   75 mg Oral QPM   [START ON 09/26/2023] docusate sodium   100 mg Oral Daily   ferric citrate   210 mg Oral TID WC   [START ON 09/26/2023] heparin   5,000 Units Subcutaneous Q8H   multivitamin  1 tablet Oral QHS   pantoprazole   40 mg Oral Daily    sodium chloride       ceFAZolin (ANCEF) IV     magnesium sulfate bolus IVPB     acetaminophen  **OR** acetaminophen , alum & mag hydroxide-simeth, bisacodyl, guaiFENesin-dextromethorphan, hydrALAZINE , labetalol , magnesium sulfate bolus IVPB, metoprolol tartrate, morphine  injection, ondansetron ,  oxyCODONE , phenol, polyethylene glycol, potassium chloride 

## 2023-09-25 NOTE — Anesthesia Procedure Notes (Signed)
 Procedure Name: Intubation Date/Time: 09/25/2023 8:43 AM  Performed by: Gabe Jock, CRNAPre-anesthesia Checklist: Patient identified, Emergency Drugs available, Suction available and Patient being monitored Patient Re-evaluated:Patient Re-evaluated prior to induction Oxygen  Delivery Method: Circle System Utilized Preoxygenation: Pre-oxygenation with 100% oxygen  Induction Type: IV induction Ventilation: Mask ventilation without difficulty Laryngoscope Size: Mac and 4 Grade View: Grade I Tube type: Oral Tube size: 7.0 mm Number of attempts: 1 Airway Equipment and Method: Stylet Placement Confirmation: ETT inserted through vocal cords under direct vision, positive ETCO2 and breath sounds checked- equal and bilateral Secured at: 22 cm Tube secured with: Tape Dental Injury: Teeth and Oropharynx as per pre-operative assessment

## 2023-09-25 NOTE — Interval H&P Note (Signed)
 History and Physical Interval Note:  09/25/2023 7:56 AM  Gloria Gallagher  has presented today for surgery, with the diagnosis of CLI LLE.  The various methods of treatment have been discussed with the patient and family. After consideration of risks, benefits and other options for treatment, the patient has consented to  Procedure(s): BYPASS GRAFT FEMORAL-POPLITEAL ARTERY (Left) as a surgical intervention.  The patient's history has been reviewed, patient examined, no change in status, stable for surgery.  I have reviewed the patient's chart and labs.  Questions were answered to the patient's satisfaction.     Carlene Che

## 2023-09-25 NOTE — Op Note (Signed)
 DATE OF SERVICE: 09/25/2023  PATIENT:  Gloria Gallagher  73 y.o. 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 My assistant helped with exposure through counter tension, suctioning, ligation and retraction to better visualize the surgical field.  My assistant expedited sewing during the case by following my sutures. Wherever I use the term "we" in the report, my assistant actively helped me with that portion of the procedure.  ANESTHESIA:   general  EBL: 50mL  BLOOD ADMINISTERED:none  DRAINS: none   LOCAL MEDICATIONS USED:  NONE  SPECIMEN:  none  COUNTS: confirmed correct.  TOURNIQUET:  none  PATIENT DISPOSITION:  PACU - hemodynamically stable.   Delay start of Pharmacological VTE agent (>24hrs) due to surgical blood loss or risk of bleeding: no  INDICATION FOR PROCEDURE: Gloria Gallagher is a 73 y.o. female with left foot ischemic ulceration.  Noninvasive testing and angiography she has severe peripheral arterial disease, suitable for femoral-popliteal bypass grafting.. After careful discussion of risks, benefits, and alternatives the patient was offered femoral-popliteal bypass grafting. The patient understood and wished to proceed.  OPERATIVE FINDINGS: Severe plaque in the common femoral artery with a soft anterior artery.  Good result from limited endarterectomy with torrential inflow achieved and better flow in the profunda femoris artery.  Below-knee popliteal artery healthier than anticipated.  Good technical result from  bypass grafting; brisk Doppler flow in the posterior tibial artery which was graft dependent.  DESCRIPTION OF PROCEDURE: After identification of the patient in the pre-operative holding area, the patient was transferred to the operating room. The patient was positioned supine on the operating room table. Anesthesia was induced. The left leg was prepped and draped in standard fashion. A surgical pause was performed confirming correct patient, procedure, and operative location.  Intraoperative ultrasound was performed to evaluate the greater saphenous vein.  This was not adequate for use as a bypass conduit.  An oblique incision was made in the groin to expose the common femoral artery bifurcation.  The incision was carried down through subcutaneous tissue until the femoral sheath was encountered.  This was incised longitudinally.  The common femoral artery and its bifurcation were exposed carefully.  The superficial femoral artery, profunda femoris artery, and proximal common femoral artery were encircled with Silastic Vesseloops.  A longitudinal incision was made in the medial left calf to expose the below-knee popliteal artery.  The incision was carried down through the subcutaneous tissue until the superficial posterior fascia was encountered.  This was divided.  The semimembranosus and semitendinosus tendons were divided.  The popliteal vascular bundle was identified after sweeping the gastrocnemius posteriorly.  The below-knee popliteal artery was skeletonized and encircled proximally and distally with Silastic Vesseloops.  The artery was evaluated and found to be healthier than anticipated based on angiogram.  An anatomic tunnel was created connecting the 2 exposures using a Gore tunneling device.  A 6 mm externally supported PTFE vascular graft was then delivered through the tunnel.  The patient was systemically heparinized.  Activated clotting time measurements were used at the case to  confirm  adequate anticoagulation.  Clamps were applied to the common femoral artery, profunda femoris artery, and superficial femoral artery.  An anterior arteriotomy was made with an 11 blade and extended with Potts scissors at the distalmost common femoral artery.  Severe plaque was encountered in the base of the wound.  A limited endarterectomy was performed using eversion techniques proximally into the common femoral artery and distally into the superficial femoral artery.  Good result was achieved with both eversion.  Torrential inflow was achieved.  Clamps were reapplied.  The proximal end of the vascular graft was spatulated to allow end-to-side anastomosis to the arteriotomy.  The area anastomosis was then performed using continuous running suture of 5-0 Prolene.  After completion the anastomosis was flushed on the open end of the vascular graft.  A clamp was applied to the vascular graft.  Hemostasis was achieved in the groin.  Attention was turned to the below-knee popliteal artery.  Clamps were applied to the proximal and distal below-knee popliteal artery.  A anterior arteriotomy was made with 11 blade and extended with Potts scissors.  The distal end of the vascular graft was spatulated to allow end-to-side anastomosis to the below-knee popliteal arteriotomy.  This was then performed using continuous running suture of 6-0 Prolene.  Prior to completion the anastomosis was flushed and de-aired.  Clamps were released on the popliteal artery.  Clamp was released on the vascular graft.  Hemostasis was achieved in the surgical bed.  The revascularization was evaluated with Doppler machine.  Brisk Doppler flow was noted in the below-knee popliteal artery distal to the anastomosis.  This was graft dependent.  Brisk Doppler flow was noted in the posterior tibial artery at the ankle.  This was graft dependent.  Satisfied we ended the case here.  Heparin  was reversed with protamine.  Hemostasis was again confirmed in  the anastomoses in the surgical beds.  The wounds were closed in layers using 2-0 Vicryl, 3-0 Vicryl, 4-0 Monocryl.  Benzoin and Steri-Strips were applied.  Clean bandages were applied.  Upon completion of the case instrument and sharps counts were confirmed correct. The patient was transferred to the PACU in good condition. I was present for all portions of the procedure.  FOLLOW UP PLAN: Assuming a normal postoperative course, VVS PA will see the patient in 4 weeks with ABI and left lower extremity arterial duplex.  Patient can resume aspirin  and Plavix  today.  Will resume DOAC on postoperative day 2 if no concern for bleeding.  Heber Little. Edgardo Goodwill, MD [facility] Phone Number: [phone] 09/25/2023 11:02 AM

## 2023-09-25 NOTE — Anesthesia Postprocedure Evaluation (Signed)
 Anesthesia Post Note  Patient: Gloria Gallagher  Procedure(s) Performed: BYPASS GRAFT FEMORAL-POPLITEAL ARTERY (Left)     Patient location during evaluation: PACU Anesthesia Type: General Level of consciousness: awake and alert Pain management: pain level controlled Vital Signs Assessment: post-procedure vital signs reviewed and stable Respiratory status: spontaneous breathing, nonlabored ventilation, respiratory function stable and patient connected to nasal cannula oxygen  Cardiovascular status: blood pressure returned to baseline and stable Postop Assessment: no apparent nausea or vomiting Anesthetic complications: no  No notable events documented.  Last Vitals:  Vitals:   09/25/23 1500 09/25/23 1515  BP: (!) 99/59 (!) 111/56  Pulse: 65 67  Resp: 12 16  Temp:  36.4 C  SpO2: 100% 100%    Last Pain:  Vitals:   09/25/23 1430  TempSrc:   PainSc: Asleep                 Willian Harrow

## 2023-09-25 NOTE — Transfer of Care (Signed)
 Immediate Anesthesia Transfer of Care Note  Patient: Gloria Gallagher  Procedure(s) Performed: BYPASS GRAFT FEMORAL-POPLITEAL ARTERY (Left)  Patient Location: PACU  Anesthesia Type:General  Level of Consciousness: awake and drowsy  Airway & Oxygen  Therapy: Patient Spontanous Breathing and Patient connected to nasal cannula oxygen   Post-op Assessment: Report given to RN and Post -op Vital signs reviewed and stable  Post vital signs: Reviewed and stable  Last Vitals:  Vitals Value Taken Time  BP 116/70 09/25/23 1100  Temp 97.2   Pulse 86 09/25/23 1106  Resp 17 09/25/23 1106  SpO2 89 % 09/25/23 1106  Vitals shown include unfiled device data.  Last Pain:  Vitals:   09/25/23 0731  TempSrc:   PainSc: 10-Worst pain ever      Patients Stated Pain Goal: 4 (09/25/23 0731)  Complications: No notable events documented.

## 2023-09-25 NOTE — Anesthesia Procedure Notes (Signed)
 Arterial Line Insertion Start/End5/14/2025 7:59 AM, 09/25/2023 8:05 AM Performed by: Willian Harrow, MD, Gabe Jock, CRNA, CRNA  Patient location: Pre-op. Preanesthetic checklist: patient identified, IV checked, site marked, risks and benefits discussed, surgical consent, monitors and equipment checked, pre-op evaluation, timeout performed and anesthesia consent Lidocaine  1% used for infiltration and patient sedated Right, radial was placed Catheter size: 20 G Hand hygiene performed  and maximum sterile barriers used   Attempts: 1 Procedure performed without using ultrasound guided technique. Following insertion, dressing applied. Post procedure assessment: normal and unchanged  Patient tolerated the procedure well with no immediate complications.

## 2023-09-26 ENCOUNTER — Encounter (HOSPITAL_COMMUNITY): Payer: Self-pay | Admitting: Vascular Surgery

## 2023-09-26 LAB — BASIC METABOLIC PANEL WITH GFR
Anion gap: 15 (ref 5–15)
BUN: 52 mg/dL — ABNORMAL HIGH (ref 8–23)
CO2: 26 mmol/L (ref 22–32)
Calcium: 9 mg/dL (ref 8.9–10.3)
Chloride: 95 mmol/L — ABNORMAL LOW (ref 98–111)
Creatinine, Ser: 9.46 mg/dL — ABNORMAL HIGH (ref 0.44–1.00)
GFR, Estimated: 4 mL/min — ABNORMAL LOW (ref >60)
Glucose, Bld: 98 mg/dL (ref 70–99)
Potassium: 4.9 mmol/L (ref 3.5–5.1)
Sodium: 136 mmol/L (ref 135–145)

## 2023-09-26 LAB — CBC
HCT: 32.4 % — ABNORMAL LOW (ref 36.0–46.0)
Hemoglobin: 10.2 g/dL — ABNORMAL LOW (ref 12.0–15.0)
MCH: 29.6 pg (ref 26.0–34.0)
MCHC: 31.5 g/dL (ref 30.0–36.0)
MCV: 93.9 fL (ref 80.0–100.0)
Platelets: 165 10*3/uL (ref 150–400)
RBC: 3.45 MIL/uL — ABNORMAL LOW (ref 3.87–5.11)
RDW: 15.7 % — ABNORMAL HIGH (ref 11.5–15.5)
WBC: 5 10*3/uL (ref 4.0–10.5)
nRBC: 0 % (ref 0.0–0.2)

## 2023-09-26 LAB — CREATININE, SERUM
Creatinine, Ser: 9.5 mg/dL — ABNORMAL HIGH (ref 0.44–1.00)
GFR, Estimated: 4 mL/min — ABNORMAL LOW (ref >60)

## 2023-09-26 LAB — LIPID PANEL
Cholesterol: 130 mg/dL (ref 0–200)
HDL: 38 mg/dL — ABNORMAL LOW (ref >40)
LDL Cholesterol: 83 mg/dL (ref 0–99)
Total CHOL/HDL Ratio: 3.4 ratio
Triglycerides: 47 mg/dL (ref <150)
VLDL: 9 mg/dL (ref 0–40)

## 2023-09-26 LAB — POCT ACTIVATED CLOTTING TIME: Activated Clotting Time: 256 s

## 2023-09-26 LAB — PHOSPHORUS: Phosphorus: 5.5 mg/dL — ABNORMAL HIGH (ref 2.5–4.6)

## 2023-09-26 LAB — HEPATITIS B SURFACE ANTIGEN: Hepatitis B Surface Ag: NONREACTIVE

## 2023-09-26 MED ORDER — ATORVASTATIN CALCIUM 40 MG PO TABS
40.0000 mg | ORAL_TABLET | Freq: Every evening | ORAL | Status: DC
  Administered 2023-09-26: 40 mg via ORAL
  Filled 2023-09-26: qty 1

## 2023-09-26 MED ORDER — ALBUMIN HUMAN 25 % IV SOLN
25.0000 g | Freq: Once | INTRAVENOUS | Status: AC | PRN
  Administered 2023-09-26: 25 g via INTRAVENOUS

## 2023-09-26 MED ORDER — CHLORHEXIDINE GLUCONATE CLOTH 2 % EX PADS
6.0000 | MEDICATED_PAD | Freq: Every day | CUTANEOUS | Status: DC
  Administered 2023-09-27 – 2023-09-30 (×4): 6 via TOPICAL

## 2023-09-26 MED ORDER — HEPARIN SODIUM (PORCINE) 1000 UNIT/ML IJ SOLN
INTRAMUSCULAR | Status: AC
  Filled 2023-09-26: qty 4

## 2023-09-26 MED ORDER — ALBUMIN HUMAN 25 % IV SOLN
INTRAVENOUS | Status: AC
  Filled 2023-09-26: qty 100

## 2023-09-26 MED ORDER — ACETAMINOPHEN 325 MG PO TABS
ORAL_TABLET | ORAL | Status: AC
  Filled 2023-09-26: qty 1

## 2023-09-26 NOTE — Progress Notes (Signed)
 Received patient in bed to unit. Bay 6 Alert and oriented. x4 Informed consent signed and in chart. Yes  TX duration:3.25 hours  Patient tolerated well.  Transported back to the room Via bed Alert, without acute distress.  Hand-off given to patient's nurse. Ozie Bo RN  Access used: Left AFG Access issues: none  Total UF removed: 2500 Medication(s) given: Albumin   Post HD weight: 68.6kg Post HD VS: 98.7 oral temp, BVD 77.9, 99/63 BP, 99% on RA, 82 Pulse ,   Nayah Lukens [facility]

## 2023-09-26 NOTE — Progress Notes (Signed)
 PHARMACIST LIPID MONITORING   Gloria Gallagher is a 73 y.o. female admitted on 09/25/2023  s/p L femoral endarterectomy with femoral to BK popliteal with PTFE .  Pharmacy has been consulted to optimize lipid-lowering therapy with the indication of secondary prevention for clinical ASCVD.  Recent Labs:  Lipid Panel (last 6 months):   Lab Results  Component Value Date   CHOL 130 09/26/2023   TRIG 47 09/26/2023   HDL 38 (L) 09/26/2023   CHOLHDL 3.4 09/26/2023   VLDL 9 09/26/2023   LDLCALC 83 09/26/2023    Hepatic function panel (last 6 months):   Lab Results  Component Value Date   AST 19 09/25/2023   ALT 12 09/25/2023   ALKPHOS 141 (H) 09/25/2023   BILITOT 0.7 09/25/2023    SCr (since admission):   Serum creatinine: 9.46 mg/dL (H) 09/81/19 1478 Estimated creatinine clearance: 5.2 mL/min (A)  Current therapy and lipid therapy tolerance Current lipid-lowering therapy: Atorvastatin  20mg  daily Previous lipid-lowering therapies (if applicable): n/a Documented or reported allergies or intolerances to lipid-lowering therapies (if applicable): n/a  Assessment:   LDL 83. Will increase atorvastatin  to 40mg  daily.   Plan:    1.Statin intensity (high intensity recommended for all patients regardless of the LDL):  Add or increase statin to high intensity.  2.Add ezetimibe (if any one of the following):   Not indicated at this time.  3.Refer to lipid clinic:   No  4.Follow-up with:  Primary care provider - Maryellen Snare, NP  5.Follow-up labs after discharge:  Changes in lipid therapy were made. Check a lipid panel in 8-12 weeks then annually.      Tekelia Kareem A. Brynn Caras, PharmD, BCPS, FNKF Clinical Pharmacist [place] Please utilize Amion for appropriate phone number to reach the unit pharmacist [facility])  09/26/2023, 1:13 PM

## 2023-09-26 NOTE — Progress Notes (Signed)
 Pt receives out-pt HD at [facility] on MWF 6:20 am chair time. Will assist as needed.   Lauraine Polite Renal Navigator [phone]

## 2023-09-26 NOTE — Progress Notes (Addendum)
 PT Cancellation Note  Patient Details Name: MAILEY BETRO MRN: [id number] DOB: 06-09-1950   Cancelled Treatment:    Reason Eval/Treat Not Completed: Patient at procedure or test/unavailable (Off unit for HD. Acute PT to re-attempt as able.)  12:23- Pt continues to be off unit.   Jatara Huettner W, PT, DPT Secure Chat Preferred  Rehab Office [phone]  [name] 09/26/2023, 7:45 AM

## 2023-09-26 NOTE — Progress Notes (Signed)
 OT Cancellation Note  Patient Details Name: Gloria Gallagher MRN: [id number] DOB: [birth year]   Cancelled Treatment:    Reason Eval/Treat Not Completed: Patient at procedure or test/ unavailable. Pt currently in HD.  Jonette Nestle 09/26/2023, 7:53 AM Avanell Leigh, OTR/L Acute Rehabilitation Services Office: [phone]

## 2023-09-26 NOTE — Plan of Care (Signed)
  Problem: Clinical Measurements: Goal: Postoperative complications will be avoided or minimized Outcome: Progressing   

## 2023-09-26 NOTE — Progress Notes (Signed)
  Progress Note    09/26/2023 11:13 AM 1 Day Post-Op  Subjective:  no complaints   Vitals:   09/26/23 1030 09/26/23 1100  BP: 95/62 (!) 97/53  Pulse:  74  Resp:  15  Temp:    SpO2:  96%   Physical Exam: Lungs:  non labored Incisions:  groin and pop bypass incisions c/d/i Extremities:  brisk L PT signal by doppler Neurologic: A&O  CBC    Component Value Date/Time   WBC 5.0 09/26/2023 0430   RBC 3.45 (L) 09/26/2023 0430   HGB 10.2 (L) 09/26/2023 0430   HGB 11.6 (L) 10/25/2022 1511   HGB 11.5 11/28/2021 0928   HCT 32.4 (L) 09/26/2023 0430   HCT 34.6 11/28/2021 0928   PLT 165 09/26/2023 0430   PLT 165 10/25/2022 1511   PLT 169 11/28/2021 0928   MCV 93.9 09/26/2023 0430   MCV 91 11/28/2021 0928   MCH 29.6 09/26/2023 0430   MCHC 31.5 09/26/2023 0430   RDW 15.7 (H) 09/26/2023 0430   RDW 13.4 11/28/2021 0928   LYMPHSABS 1.0 09/19/2023 1455   MONOABS 0.4 09/19/2023 1455   EOSABS 0.0 09/19/2023 1455   BASOSABS 0.0 09/19/2023 1455    BMET    Component Value Date/Time   NA 136 09/26/2023 0430   NA 142 11/28/2021 0928   K 4.9 09/26/2023 0430   CL 95 (L) 09/26/2023 0430   CO2 26 09/26/2023 0430   GLUCOSE 98 09/26/2023 0430   BUN 52 (H) 09/26/2023 0430   BUN 45 (H) 11/28/2021 0928   CREATININE 9.46 (H) 09/26/2023 0430   CREATININE 9.50 (H) 09/26/2023 0430   CREATININE 6.42 (H) 10/25/2022 1511   CALCIUM  9.0 09/26/2023 0430   GFRNONAA 4 (L) 09/26/2023 0430   GFRNONAA 4 (L) 09/26/2023 0430   GFRNONAA 6 (L) 10/25/2022 1511    INR    Component Value Date/Time   INR 1.2 09/25/2023 0717     Intake/Output Summary (Last 24 hours) at 09/26/2023 1113 Last data filed at 09/26/2023 0137 Gross per 24 hour  Intake 631.58 ml  Output --  Net 631.58 ml     Assessment/Plan:  73 y.o. female is s/p L femoral endarterectomy with femoral to BK popliteal with PTFE 1 Day Post-Op   L foot warm and well perfused with brisk PT signal by doppler Incisions are well  appearing OOB today with therapy Continue asa/plavix ; possible DOAC starting tomorrow HD this morning per Nephrology   Cordie Deters, PA-C [facility] [phone] 09/26/2023 11:13 AM

## 2023-09-26 NOTE — Progress Notes (Addendum)
 [facility] Progress Note  Subjective:  Seen in HD unit, pt has L foot pain, otherwise no c/o  Vitals:   09/26/23 1100 09/26/23 1127 09/26/23 1132 09/26/23 1247  BP: (!) 97/53 (!) 97/44 (!) 109/52 (!) 110/54  Pulse: 74 80 73 71  Resp: 15 16 18 17   Temp:  97.8 F (36.6 C)  (!) 97.4 F (36.3 C)  TempSrc:    Oral  SpO2: 96% 94% 96% 96%  Weight:      Height:        Exam: Gen alert, no distress No jvd or bruits Chest clear bilat to bases RRR no MRG Abd soft ntnd no mass or ascites +bs Ext 1+ left lower extremity edema Neuro is alert, Ox 3 , nf    LUA aVF + bruit       Renal-related home meds: Sensipar 60 mg at bedtime Auryxia  3 AC 3 times daily Rena-Vite 1 daily Others: Norco as needed, Plavix , Lipitor, Eliquis , amiodarone       OP HD: [facility] 3h  B400   66.5kg   L AVF   Heparin  3000       Assessment/ Plan: Ischemic left lower extremity: S/P surgical revasc VVS 09/25/23. ESRD: on HD MWF.  HD today off schedule. Next HD Friday.  BP: not on bp lowering meds at home. BP's here low-normal.  Volume: mild LLE edema. Plan 2.5 L UF w/ HD today.  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    Larry Poag MD  [facility] 09/26/2023, 1:12 PM  Recent Labs  Lab 09/25/23 0717 09/25/23 0743 09/26/23 0430  HGB 12.9 15.0 10.2*  ALBUMIN  3.8  --   --   CALCIUM  10.3  --  9.0  PHOS  --   --  5.5*  CREATININE 8.39* 8.30* 9.50*  9.46*  K 4.0 4.0 4.9   No results for input(s): "IRON ", "TIBC", "FERRITIN" in the last 168 hours. Inpatient medications:  amiodarone   200 mg Oral QPM   aspirin  EC  81 mg Oral Daily   atorvastatin   20 mg Oral QPM   Chlorhexidine  Gluconate Cloth  6 each Topical Q0600   cinacalcet  60 mg Oral QHS   clopidogrel   75 mg Oral QPM   docusate sodium   100 mg Oral Daily   ferric citrate   210 mg Oral TID WC   heparin   5,000 Units Subcutaneous Q8H   multivitamin  1 tablet Oral QHS    pantoprazole   40 mg Oral Daily    magnesium sulfate bolus IVPB     acetaminophen  **OR** acetaminophen , alum & mag hydroxide-simeth, bisacodyl, guaiFENesin-dextromethorphan, hydrALAZINE , labetalol , magnesium sulfate bolus IVPB, metoprolol tartrate, morphine  injection, ondansetron , oxyCODONE , phenol, polyethylene glycol, potassium chloride 

## 2023-09-27 DIAGNOSIS — Z9889 Other specified postprocedural states: Secondary | ICD-10-CM

## 2023-09-27 DIAGNOSIS — Z95828 Presence of other vascular implants and grafts: Secondary | ICD-10-CM

## 2023-09-27 LAB — BASIC METABOLIC PANEL WITH GFR
Anion gap: 12 (ref 5–15)
BUN: 29 mg/dL — ABNORMAL HIGH (ref 8–23)
CO2: 27 mmol/L (ref 22–32)
Calcium: 8.1 mg/dL — ABNORMAL LOW (ref 8.9–10.3)
Chloride: 93 mmol/L — ABNORMAL LOW (ref 98–111)
Creatinine, Ser: 6.9 mg/dL — ABNORMAL HIGH (ref 0.44–1.00)
GFR, Estimated: 6 mL/min — ABNORMAL LOW (ref >60)
Glucose, Bld: 105 mg/dL — ABNORMAL HIGH (ref 70–99)
Potassium: 3.6 mmol/L (ref 3.5–5.1)
Sodium: 132 mmol/L — ABNORMAL LOW (ref 135–145)

## 2023-09-27 LAB — CBC
HCT: 32.3 % — ABNORMAL LOW (ref 36.0–46.0)
Hemoglobin: 10.4 g/dL — ABNORMAL LOW (ref 12.0–15.0)
MCH: 30.3 pg (ref 26.0–34.0)
MCHC: 32.2 g/dL (ref 30.0–36.0)
MCV: 94.2 fL (ref 80.0–100.0)
Platelets: 140 10*3/uL — ABNORMAL LOW (ref 150–400)
RBC: 3.43 MIL/uL — ABNORMAL LOW (ref 3.87–5.11)
RDW: 15.8 % — ABNORMAL HIGH (ref 11.5–15.5)
WBC: 5.7 10*3/uL (ref 4.0–10.5)
nRBC: 0 % (ref 0.0–0.2)

## 2023-09-27 LAB — HEPATITIS B SURFACE ANTIBODY, QUANTITATIVE: Hep B S AB Quant (Post): 61 m[IU]/mL

## 2023-09-27 MED ORDER — ATORVASTATIN CALCIUM 40 MG PO TABS
40.0000 mg | ORAL_TABLET | Freq: Every day | ORAL | Status: DC
  Administered 2023-09-27 – 2023-09-29 (×3): 40 mg via ORAL
  Filled 2023-09-27 (×3): qty 1

## 2023-09-27 MED ORDER — AMIODARONE HCL 200 MG PO TABS
200.0000 mg | ORAL_TABLET | Freq: Every day | ORAL | Status: DC
  Administered 2023-09-27 – 2023-09-29 (×3): 200 mg via ORAL
  Filled 2023-09-27 (×3): qty 1

## 2023-09-27 MED ORDER — CLOPIDOGREL BISULFATE 75 MG PO TABS
75.0000 mg | ORAL_TABLET | Freq: Every day | ORAL | Status: DC
  Administered 2023-09-27 – 2023-09-29 (×3): 75 mg via ORAL
  Filled 2023-09-27 (×3): qty 1

## 2023-09-27 MED ORDER — APIXABAN 5 MG PO TABS
5.0000 mg | ORAL_TABLET | Freq: Two times a day (BID) | ORAL | Status: DC
  Administered 2023-09-27 – 2023-09-30 (×7): 5 mg via ORAL
  Filled 2023-09-27 (×7): qty 1

## 2023-09-27 NOTE — Progress Notes (Signed)
 Mobility Specialist Progress Note:    09/27/23 1022  Mobility  Activity Ambulated with assistance to bathroom  Level of Assistance Standby assist, set-up cues, supervision of patient - no hands on  Assistive Device Front wheel walker  Distance Ambulated (ft) 12 ft  Activity Response Tolerated well  Mobility Referral Yes  Mobility visit 1 Mobility  Mobility Specialist Start Time (ACUTE ONLY) 1022  Mobility Specialist Stop Time (ACUTE ONLY) 1025  Mobility Specialist Time Calculation (min) (ACUTE ONLY) 3 min   Responded to bathroom call light, SBA with RW to ambulate back to bed. Pt c/o BLE swelling and pain. Tolerated well, VSS throughout. Left pt in bed with all needs met, call bell in reach.    Milli Woolridge Mobility Specialist Please contact via Special educational needs teacher or  Rehab office at [phone]

## 2023-09-27 NOTE — Evaluation (Signed)
 Occupational Therapy Evaluation Patient Details Name: Gloria Gallagher MRN: [id number] DOB: Sep 19, 1950 Today's Date: 09/27/2023   History of Present Illness   Pt is a 73 year old woman admitteon on 5/14 for L femoral endarterectomy and L fem-pop BPG. PMH: former smoker, HTN, CAD, afib, arthritis, anemia, ESRD, GERD, peripheral neuropathy.     Clinical Impressions Pt typically walks with a cane and is assisted as needed for bathing and dressing, depending on the day. She lives with her sister. Pt relies on her family for med, appointment and financial management. Pt presents with L LE pain and impaired standing balance. She needs assist for L LE in and out of bed and CGA for ambulation with RW. Educated pt and provided reacher, sock aid and long handled sponge. Instructed to dress L LE first and undress last. Will follow acutely. Do not anticipate pt will require post acute OT.      If plan is discharge home, recommend the following:   A little help with walking and/or transfers;A little help with bathing/dressing/bathroom;Assistance with cooking/housework;Assist for transportation;Help with stairs or ramp for entrance;Direct supervision/assist for medications management;Direct supervision/assist for financial management     Functional Status Assessment   Patient has had a recent decline in their functional status and demonstrates the ability to make significant improvements in function in a reasonable and predictable amount of time.     Equipment Recommendations   None recommended by OT     Recommendations for Other Services         Precautions/Restrictions   Precautions Precautions: Fall Restrictions Weight Bearing Restrictions Per Provider Order: No     Mobility Bed Mobility Overal bed mobility: Needs Assistance Bed Mobility: Supine to Sit, Sit to Supine     Supine to sit: Min assist, HOB elevated Sit to supine: Min assist   General bed mobility comments:  assist for L LE    Transfers Overall transfer level: Needs assistance Equipment used: Rolling walker (2 wheels) Transfers: Sit to/from Stand Sit to Stand: Contact guard assist                  Balance Overall balance assessment: Needs assistance   Sitting balance-Leahy Scale: Good       Standing balance-Leahy Scale: Poor                             ADL either performed or assessed with clinical judgement   ADL Overall ADL's : Needs assistance/impaired Eating/Feeding: Independent   Grooming: Wash/dry hands;Standing;Contact guard assist;Brushing hair;Set up   Upper Body Bathing: Minimal assistance;Sitting   Lower Body Bathing: Moderate assistance;Sit to/from stand   Upper Body Dressing : Set up;Sitting   Lower Body Dressing: Moderate assistance;Sit to/from stand   Toilet Transfer: Contact guard assist;Ambulation;BSC/3in1;Rolling walker (2 wheels)   Toileting- Clothing Manipulation and Hygiene: Set up;Sitting/lateral lean       Functional mobility during ADLs: Contact guard assist;Rolling walker (2 wheels) General ADL Comments: educated and provided AE for LB ADLs     Vision Baseline Vision/History: 1 Wears glasses (readers) Ability to See in Adequate Light: 0 Adequate Patient Visual Report: No change from baseline       Perception         Praxis         Pertinent Vitals/Pain Pain Assessment Pain Assessment: Faces Faces Pain Scale: Hurts even more Pain Location: L LE Pain Descriptors / Indicators: Grimacing, Guarding Pain Intervention(s): Monitored during session,  Repositioned     Extremity/Trunk Assessment Upper Extremity Assessment Upper Extremity Assessment: Overall WFL for tasks assessed   Lower Extremity Assessment Lower Extremity Assessment: Defer to PT evaluation   Cervical / Trunk Assessment Cervical / Trunk Assessment: Normal   Communication Communication Communication: No apparent difficulties   Cognition  Arousal: Alert Behavior During Therapy: WFL for tasks assessed/performed Cognition: No family/caregiver present to determine baseline             OT - Cognition Comments: family helps with meds, keep her appointments and pay her bills                 Following commands: Intact       Cueing  General Comments          Exercises     Shoulder Instructions      Home Living Family/patient expects to be discharged to:: Private residence Living Arrangements: Other relatives (sister) Available Help at Discharge: Family;Available PRN/intermittently Type of Home: House Home Access: Level entry;Other (comment) (threshold)     Home Layout: Two level;Able to live on main level with bedroom/bathroom     Bathroom Shower/Tub: Producer, television/film/video: Standard     Home Equipment: Agricultural consultant (2 wheels);Cane - single point;Shower seat;BSC/3in1;Hand held shower head          Prior Functioning/Environment Prior Level of Function : Needs assist             Mobility Comments: walks with a cane ADLs Comments: sometimes assisted to wash back and for LB ADLs, sister primarily does cooking and cleaning    OT Problem List: Impaired balance (sitting and/or standing);Decreased knowledge of use of DME or AE;Pain   OT Treatment/Interventions: Self-care/ADL training;DME and/or AE instruction;Therapeutic activities;Patient/family education;Balance training      OT Goals(Current goals can be found in the care plan section)   Acute Rehab OT Goals OT Goal Formulation: With patient Time For Goal Achievement: 10/11/23 Potential to Achieve Goals: Good ADL Goals Pt Will Perform Grooming: with modified independence;standing Pt Will Perform Lower Body Bathing: with modified independence;sit to/from stand;with adaptive equipment Pt Will Perform Lower Body Dressing: with modified independence;with adaptive equipment;sit to/from stand Pt Will Transfer to Toilet: with  modified independence;ambulating;bedside commode Pt Will Perform Toileting - Clothing Manipulation and hygiene: with modified independence;sit to/from stand Additional ADL Goal #1: Pt will complete bed mobility mod I in preparation for ADLs.   OT Frequency:  Min 2X/week    Co-evaluation              AM-PAC OT "6 Clicks" Daily Activity     Outcome Measure Help from another person eating meals?: None Help from another person taking care of personal grooming?: A Little Help from another person toileting, which includes using toliet, bedpan, or urinal?: A Little Help from another person bathing (including washing, rinsing, drying)?: A Lot Help from another person to put on and taking off regular upper body clothing?: A Little Help from another person to put on and taking off regular lower body clothing?: A Lot 6 Click Score: 17   End of Session Equipment Utilized During Treatment: Rolling walker (2 wheels);Gait belt  Activity Tolerance: Patient limited by pain Patient left: in bed;with call bell/phone within reach  OT Visit Diagnosis: Unsteadiness on feet (R26.81);Other symptoms and signs involving cognitive function;Pain                Time: 0981-1914 OT Time Calculation (min): 41 min Charges:  OT  General Charges $OT Visit: 1 Visit OT Evaluation $OT Eval Moderate Complexity: 1 Mod OT Treatments $Self Care/Home Management : 23-37 mins Avanell Leigh, OTR/L Acute Rehabilitation Services Office: [phone]  Jonette Nestle 09/27/2023, 1:04 PM

## 2023-09-27 NOTE — Plan of Care (Signed)
  Problem: Education: Goal: Knowledge of prescribed regimen will improve Outcome: Progressing   Problem: Activity: Goal: Ability to tolerate increased activity will improve Outcome: Progressing   Problem: Bowel/Gastric: Goal: Gastrointestinal status for postoperative course will improve Outcome: Progressing   Problem: Clinical Measurements: Goal: Postoperative complications will be avoided or minimized Outcome: Progressing   Problem: Skin Integrity: Goal: Demonstration of wound healing without infection will improve Outcome: Progressing   Problem: Health Behavior/Discharge Planning: Goal: Ability to manage health-related needs will improve Outcome: Progressing   Problem: Clinical Measurements: Goal: Ability to maintain clinical measurements within normal limits will improve Outcome: Progressing Goal: Will remain free from infection Outcome: Progressing Goal: Diagnostic test results will improve Outcome: Progressing Goal: Respiratory complications will improve Outcome: Progressing Goal: Cardiovascular complication will be avoided Outcome: Progressing   Problem: Activity: Goal: Risk for activity intolerance will decrease Outcome: Progressing   Problem: Nutrition: Goal: Adequate nutrition will be maintained Outcome: Progressing   Problem: Coping: Goal: Level of anxiety will decrease Outcome: Progressing   Problem: Elimination: Goal: Will not experience complications related to bowel motility Outcome: Progressing Goal: Will not experience complications related to urinary retention Outcome: Progressing   Problem: Pain Managment: Goal: General experience of comfort will improve and/or be controlled Outcome: Progressing   Problem: Safety: Goal: Ability to remain free from injury will improve Outcome: Progressing   Problem: Skin Integrity: Goal: Risk for impaired skin integrity will decrease Outcome: Progressing

## 2023-09-27 NOTE — Progress Notes (Addendum)
  Progress Note    09/27/2023 7:43 AM 2 Days Post-Op  Subjective:  no complaints   Vitals:   09/27/23 0406 09/27/23 0408  BP:  108/62  Pulse:    Resp: (!) 23 20  Temp:  98 F (36.7 C)  SpO2:  92%   Physical Exam: Lungs:  non labored Incisions:  L groin and leg incisions c/d/i Extremities:  brisk L PT by doppler Neurologic: A&O  CBC    Component Value Date/Time   WBC 5.7 09/27/2023 0517   RBC 3.43 (L) 09/27/2023 0517   HGB 10.4 (L) 09/27/2023 0517   HGB 11.6 (L) 10/25/2022 1511   HGB 11.5 11/28/2021 0928   HCT 32.3 (L) 09/27/2023 0517   HCT 34.6 11/28/2021 0928   PLT 140 (L) 09/27/2023 0517   PLT 165 10/25/2022 1511   PLT 169 11/28/2021 0928   MCV 94.2 09/27/2023 0517   MCV 91 11/28/2021 0928   MCH 30.3 09/27/2023 0517   MCHC 32.2 09/27/2023 0517   RDW 15.8 (H) 09/27/2023 0517   RDW 13.4 11/28/2021 0928   LYMPHSABS 1.0 09/19/2023 1455   MONOABS 0.4 09/19/2023 1455   EOSABS 0.0 09/19/2023 1455   BASOSABS 0.0 09/19/2023 1455    BMET    Component Value Date/Time   NA 132 (L) 09/27/2023 0517   NA 142 11/28/2021 0928   K 3.6 09/27/2023 0517   CL 93 (L) 09/27/2023 0517   CO2 27 09/27/2023 0517   GLUCOSE 105 (H) 09/27/2023 0517   BUN 29 (H) 09/27/2023 0517   BUN 45 (H) 11/28/2021 0928   CREATININE 6.90 (H) 09/27/2023 0517   CREATININE 6.42 (H) 10/25/2022 1511   CALCIUM  8.1 (L) 09/27/2023 0517   GFRNONAA 6 (L) 09/27/2023 0517   GFRNONAA 6 (L) 10/25/2022 1511    INR    Component Value Date/Time   INR 1.2 09/25/2023 0717     Intake/Output Summary (Last 24 hours) at 09/27/2023 0743 Last data filed at 09/27/2023 0251 Gross per 24 hour  Intake 0 ml  Output 2500 ml  Net -2500 ml     Assessment/Plan:  73 y.o. female is s/p L femoral endarterectomy with femoral to BK popliteal with PTFE 2 Days Post-Op   L foot warm and well perfused with brisk PT signal by doppler Incisions are well appearing OOB with therapy Continue aspirin /plavix ; resume  eliquis  today Home when mobility improved   Cordie Deters, PA-C [facility] [phone] 09/27/2023 7:43 AM   VASCULAR STAFF ADDENDUM: I have independently interviewed and examined the patient. I agree with the above.   Heber Little. Edgardo Goodwill, MD [facility] Phone Number: [phone] 09/27/2023 10:36 AM

## 2023-09-27 NOTE — TOC Progression Note (Signed)
 Transition of Care [facility]) - Progression Note    Patient Details  Name: MICHELINA STUMP MRN: [id number] Date of Birth: 1950-12-10  [facility] CM/SW Contact  Valery Gaucher, [place] Phone Number: 09/27/2023, 10:21 AM  Clinical Narrative:     CSW Informed by RN  patient wanted to see CSW. CSW met with patient. Patient states she lives in the home with her sister. Patient inquired about assistance in the home with ADLs. CSW informed patient she can inquire w/her Medicaid worker about services she may be eligible for or she would have to pay out of pocket for assistance. She states understanding.   Liddie Reel, MSW, LCSW Clinical Social Worker      Barriers to Discharge: Continued Medical Work up  Expected Discharge Plan and Services In-house Referral: Clinical Social Work     Living arrangements for the past 2 months: Single Family Home                                       Social Determinants of Health (SDOH) Interventions SDOH Screenings   Food Insecurity: Patient Declined (09/26/2023)  Housing: Patient Declined (09/26/2023)  Transportation Needs: Patient Declined (09/26/2023)  Utilities: Patient Declined (09/26/2023)  Social Connections: Patient Declined (09/26/2023)  Tobacco Use: Medium Risk (09/25/2023)    Readmission Risk Interventions     No data to display

## 2023-09-27 NOTE — Plan of Care (Signed)
   Problem: Activity: Goal: Ability to tolerate increased activity will improve Outcome: Progressing

## 2023-09-27 NOTE — Progress Notes (Signed)
 [facility] Progress Note  Subjective:  Seen in HD unit, pt has L foot pain, otherwise no c/o  Vitals:   09/26/23 2329 09/27/23 0406 09/27/23 0408 09/27/23 0833  BP: 105/66  108/62 (!) 90/56  Pulse: 70   72  Resp: 20 (!) 23 20 19   Temp: 97.6 F (36.4 C)  98 F (36.7 C) (!) 97.2 F (36.2 C)  TempSrc: Oral  Oral Oral  SpO2: 95%  92% 95%  Weight:  68.2 kg    Height:        Exam: Gen alert, no distress No jvd or bruits Chest clear bilat to bases RRR no MRG Abd soft ntnd no mass or ascites +bs Ext 1+ left lower extremity edema Neuro is alert, Ox 3 , nf    LUA aVF + bruit       Renal-related home meds: Sensipar 60 mg at bedtime Auryxia  3 AC 3 times daily Rena-Vite 1 daily Others: Norco as needed, Plavix , Lipitor, Eliquis , amiodarone       OP HD: [facility] 3h  B400   66.5kg   L AVF   Heparin  3000       Assessment/ Plan: Ischemic left lower extremity: S/P surgical revasc VVS 09/25/23 ESRD: on HD MWF.  HD today.  BP: not on bp lowering meds at home. BP's here low-normal.  Volume: mild LLE edema. Plan 2.5 L UF w/ HD  Anemia of esrd: Hb 12-13, follow  Secondary hyperparathyroidism: CCa and phos in range, cont binders ac.    Larry Poag MD  [facility] 09/27/2023, 11:46 AM  Recent Labs  Lab 09/25/23 0717 09/25/23 0743 09/26/23 0430 09/27/23 0517  HGB 12.9   < > 10.2* 10.4*  ALBUMIN  3.8  --   --   --   CALCIUM  10.3  --  9.0 8.1*  PHOS  --   --  5.5*  --   CREATININE 8.39*   < > 9.50*  9.46* 6.90*  K 4.0   < > 4.9 3.6   < > = values in this interval not displayed.   No results for input(s): "IRON ", "TIBC", "FERRITIN" in the last 168 hours. Inpatient medications:  amiodarone   200 mg Oral QPM   apixaban   5 mg Oral BID   aspirin  EC  81 mg Oral Daily   atorvastatin   40 mg Oral QPM   Chlorhexidine  Gluconate Cloth  6 each Topical Q0600   cinacalcet  60 mg Oral QHS   clopidogrel   75 mg Oral QPM   docusate sodium   100 mg Oral Daily   ferric citrate    210 mg Oral TID WC   multivitamin  1 tablet Oral QHS   pantoprazole   40 mg Oral Daily    magnesium sulfate bolus IVPB     acetaminophen  **OR** acetaminophen , alum & mag hydroxide-simeth, bisacodyl, guaiFENesin-dextromethorphan, hydrALAZINE , labetalol , magnesium sulfate bolus IVPB, metoprolol tartrate, morphine  injection, ondansetron , oxyCODONE , phenol, polyethylene glycol, potassium chloride 

## 2023-09-27 NOTE — Progress Notes (Signed)
 PT Cancellation Note  Patient Details Name: Gloria Gallagher MRN: [id number] DOB: [birth year]   Cancelled Treatment:    Reason Eval/Treat Not Completed: Patient at procedure or test/unavailable (Pt off unit for HD. Acute PT to re-attempt as able.)  Jaimin Krupka W, PT, DPT Secure Chat Preferred  Rehab Office [phone]   [name] 09/27/2023, 1:22 PM

## 2023-09-28 NOTE — Progress Notes (Signed)
 Gloria Gallagher  Subjective:  Seen in room No c/o today   Vitals:   09/27/23 2310 09/28/23 0329 09/28/23 0650 09/28/23 0739  BP: (!) 107/59 (!) 96/57    Pulse:  88    Resp: 20 20 18    Temp: 98.3 F (36.8 C) 98 F (36.7 C)  (!) 97.5 F (36.4 C)  TempSrc: Oral Oral  Oral  SpO2: 95% 95%  97%  Weight:   68 kg   Height:        Exam: Gen alert, no distress No jvd or bruits Chest clear bilat to bases RRR no MRG Abd soft ntnd no mass or ascites +bs Ext 1-2+ bilat LE edema Neuro is alert, Ox 3 , nf    LUA aVF + bruit       Renal-related home meds: Sensipar  60 mg at bedtime Auryxia  3 AC 3 times daily Rena-Vite 1 daily Others: Norco as needed, Plavix , Lipitor, Eliquis , amiodarone       OP HD: [facility] 3h  B400   66.5kg   L AVF   Heparin  3000    Assessment/ Plan: Ischemic left lower extremity: S/P surgical revasc VVS 09/25/23 ESRD: on HD MWF.  HD yesterday. Next HD Monday.  BP: not on bp lowering meds at home. BP's here low-normal.  Volume: edema in both legs, L > R. Trying to get vol down w/ HD but bp's are soft. 1.5 kg over dry wt today.  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    Larry Poag MD  [facility] 09/28/2023, 10:31 AM  Recent Labs  Lab 09/25/23 0717 09/25/23 0743 09/26/23 0430 09/27/23 0517  HGB 12.9   < > 10.2* 10.4*  ALBUMIN  3.8  --   --   --   CALCIUM  10.3  --  9.0 8.1*  PHOS  --   --  5.5*  --   CREATININE 8.39*   < > 9.50*  9.46* 6.90*  K 4.0   < > 4.9 3.6   < > = values in this interval not displayed.   No results for input(s): "IRON ", "TIBC", "FERRITIN" in the last 168 hours. Inpatient medications:  amiodarone   200 mg Oral Q2200   apixaban   5 mg Oral BID   aspirin  EC  81 mg Oral Daily   atorvastatin   40 mg Oral Q2200   Chlorhexidine  Gluconate Cloth  6 each Topical Q0600   cinacalcet   60 mg Oral QHS   clopidogrel   75 mg Oral Q2200   docusate  sodium  100 mg Oral Daily   ferric citrate   210 mg Oral TID WC   multivitamin  1 tablet Oral QHS   pantoprazole   40 mg Oral Daily    magnesium  sulfate bolus IVPB     acetaminophen  **OR** acetaminophen , alum & mag hydroxide-simeth, bisacodyl , guaiFENesin -dextromethorphan, hydrALAZINE , labetalol , magnesium  sulfate bolus IVPB, metoprolol  tartrate, morphine  injection, ondansetron , oxyCODONE , phenol, polyethylene glycol, potassium chloride 

## 2023-09-28 NOTE — Progress Notes (Addendum)
  Progress Note    09/28/2023 7:50 AM 3 Days Post-Op  Subjective:  sitting up in chair. Says overall pain well controlled.  No complaints   Vitals:   09/28/23 0650 09/28/23 0739  BP:    Pulse:    Resp: 18   Temp:  (!) 97.5 F (36.4 C)  SpO2:  97%   Physical Exam: Cardiac:  regular Lungs:  non labored Incisions:  left groin incision c/d/I no swelling or hematoma, left BK popliteal incision c/d/I Extremities:  Doppler left PT/DP signals, foot warm, edematous Abdomen:  soft Neurologic: alert and oriented  CBC    Component Value Date/Time   WBC 5.7 09/27/2023 0517   RBC 3.43 (L) 09/27/2023 0517   HGB 10.4 (L) 09/27/2023 0517   HGB 11.6 (L) 10/25/2022 1511   HGB 11.5 11/28/2021 0928   HCT 32.3 (L) 09/27/2023 0517   HCT 34.6 11/28/2021 0928   PLT 140 (L) 09/27/2023 0517   PLT 165 10/25/2022 1511   PLT 169 11/28/2021 0928   MCV 94.2 09/27/2023 0517   MCV 91 11/28/2021 0928   MCH 30.3 09/27/2023 0517   MCHC 32.2 09/27/2023 0517   RDW 15.8 (H) 09/27/2023 0517   RDW 13.4 11/28/2021 0928   LYMPHSABS 1.0 09/19/2023 1455   MONOABS 0.4 09/19/2023 1455   EOSABS 0.0 09/19/2023 1455   BASOSABS 0.0 09/19/2023 1455    BMET    Component Value Date/Time   NA 132 (L) 09/27/2023 0517   NA 142 11/28/2021 0928   K 3.6 09/27/2023 0517   CL 93 (L) 09/27/2023 0517   CO2 27 09/27/2023 0517   GLUCOSE 105 (H) 09/27/2023 0517   BUN 29 (H) 09/27/2023 0517   BUN 45 (H) 11/28/2021 0928   CREATININE 6.90 (H) 09/27/2023 0517   CREATININE 6.42 (H) 10/25/2022 1511   CALCIUM  8.1 (L) 09/27/2023 0517   GFRNONAA 6 (L) 09/27/2023 0517   GFRNONAA 6 (L) 10/25/2022 1511    INR    Component Value Date/Time   INR 1.2 09/25/2023 0717     Intake/Output Summary (Last 24 hours) at 09/28/2023 0750 Last data filed at 09/27/2023 2051 Gross per 24 hour  Intake 120 ml  Output 1900 ml  Net -1780 ml     Assessment/Plan:  73 y.o. female is s/p  L femoral endarterectomy with femoral to BK  popliteal with PTFE  3 Days Post-Op   LLE remains well perfused and warm with doppler PT/ DP signals Left groin and left BK popliteal incisions are c/d/i Encourage elevation of extremities when in chair or bed to help with edema Continue to mobilize as tolerated. PT to eval. Cleared by OT BP soft. Asymptomatic. At her baseline H&H stable HD per Nephrology Continue Aspirin /Statin/ Plavix / Eliquis  Home when mobility improved  Deneen Finical, [place] [facility] [phone] 09/28/2023 7:50 AM  VASCULAR STAFF ADDENDUM: I have independently interviewed and examined the patient. I agree with the above.   Heber Little. Edgardo Goodwill, MD [facility] Phone Number: [phone] 09/28/2023 3:24 PM

## 2023-09-28 NOTE — Evaluation (Signed)
 Physical Therapy Evaluation Patient Details Name: Gloria Gallagher MRN: [id number] DOB: 1950/10/27 Today's Date: 09/28/2023  History of Present Illness  Pt is a 73 year old woman admitteon on 5/14 for L femoral endarterectomy and L fem-pop BPG. PMH: former smoker, HTN, CAD, afib, arthritis, anemia, ESRD, GERD, peripheral neuropathy.  Clinical Impression  Pt in recliner upon arrival and agreeable to PT eval. PTA, pt was ModI with a SP cane. In today's session, pt required increased time and effort for mobility 2/2 pain in L LE. Pt was able to stand and ambulate 100 ft with CGA and RW. Multiple standing rest breaks required with antalgic gait pattern. Pt has intermittent assist available upon return home. Recommending HHPT to work on independence with mobility. Pt currently with functional limitations due to the deficits listed below (see PT Problem List). Pt would benefit from acute skilled PT to address functional impairments. Acute PT to follow.         If plan is discharge home, recommend the following: A little help with walking and/or transfers;Assist for transportation;Help with stairs or ramp for entrance   Can travel by private vehicle    Yes    Equipment Recommendations None recommended by PT     Functional Status Assessment Patient has had a recent decline in their functional status and demonstrates the ability to make significant improvements in function in a reasonable and predictable amount of time.     Precautions / Restrictions Precautions Precautions: Fall Restrictions Weight Bearing Restrictions Per Provider Order: No      Mobility  Bed Mobility    General bed mobility comments: in recliner    Transfers Overall transfer level: Needs assistance Equipment used: Rolling walker (2 wheels) Transfers: Sit to/from Stand Sit to Stand: Contact guard assist    General transfer comment: LLE anterior to self due to pain, CGA for safety with increased time and effort     Ambulation/Gait Ambulation/Gait assistance: Contact guard assist Gait Distance (Feet): 100 Feet Assistive device: Rolling walker (2 wheels) Gait Pattern/deviations: Step-through pattern, Decreased stance time - left, Decreased step length - left, Antalgic Gait velocity: decr     General Gait Details: antalgic gait pattern with limited WB on L LE. Multiple standing rest breaks due to pain    Balance Overall balance assessment: Needs assistance Sitting-balance support: Feet supported, No upper extremity supported Sitting balance-Leahy Scale: Good     Standing balance support: Bilateral upper extremity supported, During functional activity, Reliant on assistive device for balance Standing balance-Leahy Scale: Poor Standing balance comment: reliant on RW        Pertinent Vitals/Pain Pain Assessment Pain Assessment: Faces Faces Pain Scale: Hurts even more Pain Location: L LE with dependent position and WB Pain Descriptors / Indicators: Grimacing, Guarding Pain Intervention(s): Limited activity within patient's tolerance, Monitored during session, Repositioned    Home Living Family/patient expects to be discharged to:: Private residence Living Arrangements: Other relatives (sister) Available Help at Discharge: Family;Available PRN/intermittently Type of Home: House Home Access: Level entry;Other (comment) (threshold)     Home Layout: Two level;Able to live on main level with bedroom/bathroom Home Equipment: Rolling Walker (2 wheels);Cane - single point;Shower seat;BSC/3in1;Hand held shower head      Prior Function Prior Level of Function : Needs assist    Mobility Comments: walks with a cane ADLs Comments: sometimes assisted to wash back and for LB ADLs, sister primarily does cooking and cleaning     Extremity/Trunk Assessment   Upper Extremity Assessment Upper Extremity  Assessment: Defer to OT evaluation    Lower Extremity Assessment Lower Extremity Assessment:  LLE deficits/detail LLE Deficits / Details: limited knee flexion ROM 2/2 pain, at least 3/5 LLE Sensation: WNL    Cervical / Trunk Assessment Cervical / Trunk Assessment: Normal  Communication   Communication Communication: No apparent difficulties    Cognition Arousal: Alert Behavior During Therapy: WFL for tasks assessed/performed   PT - Cognitive impairments: No apparent impairments    Following commands: Intact       Cueing Cueing Techniques: Verbal cues     General Comments General comments (skin integrity, edema, etc.): L LE edema    Exercises General Exercises - Lower Extremity Long Arc Quad: AROM, Left, 10 reps, Seated   Assessment/Plan    PT Assessment Patient needs continued PT services  PT Problem List Decreased strength;Decreased range of motion;Decreased activity tolerance;Decreased balance;Decreased mobility       PT Treatment Interventions DME instruction;Gait training;Functional mobility training;Therapeutic activities;Therapeutic exercise;Balance training;Neuromuscular re-education;Patient/family education;Stair training    PT Goals (Current goals can be found in the Care Plan section)  Acute Rehab PT Goals Patient Stated Goal: to have less pain when walking PT Goal Formulation: With patient Time For Goal Achievement: 10/12/23 Potential to Achieve Goals: Good    Frequency Min 2X/week        AM-PAC PT "6 Clicks" Mobility  Outcome Measure Help needed turning from your back to your side while in a flat bed without using bedrails?: A Little Help needed moving from lying on your back to sitting on the side of a flat bed without using bedrails?: A Little Help needed moving to and from a bed to a chair (including a wheelchair)?: A Little Help needed standing up from a chair using your arms (e.g., wheelchair or bedside chair)?: A Little Help needed to walk in hospital room?: A Little Help needed climbing 3-5 steps with a railing? : A Lot 6 Click  Score: 17    End of Session Equipment Utilized During Treatment: Gait belt Activity Tolerance: Patient tolerated treatment well Patient left: in chair;with call bell/phone within reach Nurse Communication: Mobility status PT Visit Diagnosis: Unsteadiness on feet (R26.81);Other abnormalities of gait and mobility (R26.89);Pain Pain - Right/Left: Left Pain - part of body: Leg    Time: 4098-1191 PT Time Calculation (min) (ACUTE ONLY): 17 min   Charges:   PT Evaluation $PT Eval Low Complexity: 1 Low   PT General Charges $$ ACUTE PT VISIT: 1 Visit        Orysia Blas, PT, DPT Secure Chat Preferred  Rehab Office [phone]   [name] 09/28/2023, 11:23 AM

## 2023-09-29 LAB — GLUCOSE, CAPILLARY: Glucose-Capillary: 101 mg/dL — ABNORMAL HIGH (ref 70–99)

## 2023-09-29 MED ORDER — CHLORHEXIDINE GLUCONATE CLOTH 2 % EX PADS
6.0000 | MEDICATED_PAD | Freq: Every day | CUTANEOUS | Status: DC
  Administered 2023-09-30: 6 via TOPICAL

## 2023-09-29 NOTE — Progress Notes (Signed)
 Occupational Therapy Treatment Patient Details Name: Gloria Gallagher MRN: [id number] DOB: 12-May-1951 Today's Date: 09/29/2023   History of present illness Pt is a 73 year old woman admitteon on 5/14 for L femoral endarterectomy and L fem-pop BPG. PMH: former smoker, HTN, CAD, afib, arthritis, anemia, ESRD, GERD, peripheral neuropathy.   OT comments  Pt. Seen for skilled OT treatment session.  Bed mobility S with increased time.  MOD A for use of sock aide LLE. A/E provided for pt. At previous session.  Reports she will have assistance at home for LB dressing if needed.  Sit/stand and steps to recliner with CGA with light cueing for safety/sequencing prior to sitting down.  Cont. With acute OT POC.        If plan is discharge home, recommend the following:  A little help with walking and/or transfers;A little help with bathing/dressing/bathroom;Assistance with cooking/housework;Assist for transportation;Help with stairs or ramp for entrance;Direct supervision/assist for medications management;Direct supervision/assist for financial management   Equipment Recommendations  None recommended by OT    Recommendations for Other Services      Precautions / Restrictions Precautions Precautions: Fall       Mobility Bed Mobility Overal bed mobility: Needs Assistance Bed Mobility: Supine to Sit     Supine to sit: Supervision     General bed mobility comments: hob flat, no rails, exits on L side. increased time but able to complete without physical assistance    Transfers Overall transfer level: Needs assistance Equipment used: Rolling walker (2 wheels) Transfers: Sit to/from Stand, Bed to chair/wheelchair/BSC Sit to Stand: Contact guard assist     Step pivot transfers: Contact guard assist     General transfer comment: reviewed legs touching surface and to reaching for arm rests to sit down     Balance                                           ADL either  performed or assessed with clinical judgement   ADL Overall ADL's : Needs assistance/impaired                     Lower Body Dressing: Moderate assistance;Sitting/lateral leans;With adaptive equipment;Cueing for sequencing Lower Body Dressing Details (indicate cue type and reason): unable to pull sock on with sock aide without assistance Toilet Transfer: Contact guard assist;Ambulation;Rolling walker (2 wheels) Toilet Transfer Details (indicate cue type and reason): transfer from reclier with 4 steps to recliner         Functional mobility during ADLs: Contact guard assist;Rolling walker (2 wheels) General ADL Comments: cont. education onf A/E for LB ADLS. pt. reports she will have some assistance prn for LB ADLs as needed    Extremity/Trunk Assessment              Vision       Perception     Praxis     Communication Communication Communication: No apparent difficulties   Cognition   Behavior During Therapy: WFL for tasks assessed/performed Cognition: No family/caregiver present to determine baseline             OT - Cognition Comments: family helps with meds, keep her appointments and pay her bills                 Following commands: Intact        Cueing   Cueing  Techniques: Verbal cues  Exercises      Shoulder Instructions       General Comments      Pertinent Vitals/ Pain       Pain Assessment Pain Assessment: Faces Faces Pain Scale: Hurts a little bit Pain Location: L LE with dependent position and WB Pain Descriptors / Indicators: Sore Pain Intervention(s): Limited activity within patient's tolerance, Monitored during session, Repositioned, RN gave pain meds during session  Home Living                                          Prior Functioning/Environment              Frequency  Min 2X/week        Progress Toward Goals  OT Goals(current goals can now be found in the care plan section)   Progress towards OT goals: Progressing toward goals     Plan      Co-evaluation                 AM-PAC OT "6 Clicks" Daily Activity     Outcome Measure   Help from another person eating meals?: None Help from another person taking care of personal grooming?: A Little Help from another person toileting, which includes using toliet, bedpan, or urinal?: A Little Help from another person bathing (including washing, rinsing, drying)?: A Lot Help from another person to put on and taking off regular upper body clothing?: A Little Help from another person to put on and taking off regular lower body clothing?: A Lot 6 Click Score: 17    End of Session Equipment Utilized During Treatment: Rolling walker (2 wheels)  OT Visit Diagnosis: Unsteadiness on feet (R26.81);Other symptoms and signs involving cognitive function;Pain   Activity Tolerance Patient tolerated treatment well   Patient Left in chair;with call bell/phone within reach   Nurse Communication Other (comment) (rn present during session doing assessment and giving meds. states chair alarm not needed, pt. able to verbalize to call for assistance to prevent falls)        Time: 1610-9604 OT Time Calculation (min): 16 min  Charges: OT General Charges $OT Visit: 1 Visit OT Treatments $Self Care/Home Management : 8-22 mins  Howell Macintosh, COTA/L Acute Rehabilitation [phone]   Leory Rands Lor[place]-COTA/L  09/29/2023, 9:54 AM

## 2023-09-29 NOTE — Progress Notes (Addendum)
  Progress Note    09/29/2023 8:47 AM 4 Days Post-Op  Subjective:  left leg sore, feels she needs another day   Vitals:   09/29/23 0420 09/29/23 0826  BP:  (!) 92/59  Pulse: (!) 138 77  Resp: (!) 22 19  Temp:  98.2 F (36.8 C)  SpO2:  97%   Physical Exam: Cardiac:  regular Lungs:  non labored Incisions:  left groin, left below knee incisions c/d/I without swelling or hematoma Extremities:  Doppler DP/ PT signals, foot warm Neurologic: alert and oriented   CBC    Component Value Date/Time   WBC 5.7 09/27/2023 0517   RBC 3.43 (L) 09/27/2023 0517   HGB 10.4 (L) 09/27/2023 0517   HGB 11.6 (L) 10/25/2022 1511   HGB 11.5 11/28/2021 0928   HCT 32.3 (L) 09/27/2023 0517   HCT 34.6 11/28/2021 0928   PLT 140 (L) 09/27/2023 0517   PLT 165 10/25/2022 1511   PLT 169 11/28/2021 0928   MCV 94.2 09/27/2023 0517   MCV 91 11/28/2021 0928   MCH 30.3 09/27/2023 0517   MCHC 32.2 09/27/2023 0517   RDW 15.8 (H) 09/27/2023 0517   RDW 13.4 11/28/2021 0928   LYMPHSABS 1.0 09/19/2023 1455   MONOABS 0.4 09/19/2023 1455   EOSABS 0.0 09/19/2023 1455   BASOSABS 0.0 09/19/2023 1455    BMET    Component Value Date/Time   NA 132 (L) 09/27/2023 0517   NA 142 11/28/2021 0928   K 3.6 09/27/2023 0517   CL 93 (L) 09/27/2023 0517   CO2 27 09/27/2023 0517   GLUCOSE 105 (H) 09/27/2023 0517   BUN 29 (H) 09/27/2023 0517   BUN 45 (H) 11/28/2021 0928   CREATININE 6.90 (H) 09/27/2023 0517   CREATININE 6.42 (H) 10/25/2022 1511   CALCIUM  8.1 (L) 09/27/2023 0517   GFRNONAA 6 (L) 09/27/2023 0517   GFRNONAA 6 (L) 10/25/2022 1511    INR    Component Value Date/Time   INR 1.2 09/25/2023 0717    No intake or output data in the 24 hours ending 09/29/23 0847   Assessment/Plan:  73 y.o. female is s/p L femoral endarterectomy with femoral to BK popliteal with PTFE  4 Days Post-Op   Left leg well perfused and warm with doppler DP/PT signals Left groin and left below knee incisions c/d/I Pain  control PRN Mobilize today as tolerated Feels like she needs another day to work on mobility and pain control She will miss her outpatient dialysis session so will plan for d/c tomorrow after dialysis   [facility] [phone] 09/29/2023 8:47 AM  VASCULAR STAFF ADDENDUM: I have independently interviewed and examined the patient. I agree with the above.  Doing well overall.  Patient still a bit hesitant about discharge.  She is safe for discharge, but prefers to stay.  Plan to discharge tomorrow after dialysis.  Heber Little. Edgardo Goodwill, MD [facility] Phone Number: [phone] 09/29/2023 9:16 AM

## 2023-09-29 NOTE — Progress Notes (Signed)
 [facility] Progress Note   Subjective:   Reports leg is swollen today, otherwise no complaints. Denies SOB, CP, dizziness, nausea.   Objective Vitals:   09/29/23 0003 09/29/23 0352 09/29/23 0420 09/29/23 0826  BP: (!) 93/59 102/65  (!) 92/59  Pulse: 100 89 (!) 138 77  Resp: 20 20 (!) 22 19  Temp: 98.9 F (37.2 C) 98.6 F (37 C)  98.2 F (36.8 C)  TempSrc: Oral Oral  Oral  SpO2: 95% 98%  97%  Weight:   62.7 kg   Height:       Physical Exam General:  Alert female in NAD Heart: RRR, no murmurs, rubs or gallops Lungs: CTA bilaterally, respirations unlabored Abdomen: Soft, non-distended, +BS Extremities: 1+ edema LLE, no edema RLE  Dialysis Access: LUE AVF +T/b  Additional Objective Labs: Basic Metabolic Panel: Recent Labs  Lab 09/25/23 0717 09/25/23 0743 09/26/23 0430 09/27/23 0517  NA 138 135 136 132*  K 4.0 4.0 4.9 3.6  CL 93* 95* 95* 93*  CO2 28  --  26 27  GLUCOSE 80 75 98 105*  BUN 42* 48* 52* 29*  CREATININE 8.39* 8.30* 9.50*  9.46* 6.90*  CALCIUM  10.3  --  9.0 8.1*  PHOS  --   --  5.5*  --    Liver Function Tests: Recent Labs  Lab 09/25/23 0717  AST 19  ALT 12  ALKPHOS 141*  BILITOT 0.7  PROT 7.5  ALBUMIN  3.8   No results for input(s): "LIPASE", "AMYLASE" in the last 168 hours. CBC: Recent Labs  Lab 09/25/23 0717 09/25/23 0743 09/26/23 0430 09/27/23 0517  WBC 4.4  --  5.0 5.7  HGB 12.9 15.0 10.2* 10.4*  HCT 41.7 44.0 32.4* 32.3*  MCV 96.1  --  93.9 94.2  PLT 164  --  165 140*   Blood Culture    Component Value Date/Time   SDES FLUID 10/02/2022 0900   SDES FLUID 10/02/2022 0900   SPECREQUEST NONE 10/02/2022 0900   SPECREQUEST NONE 10/02/2022 0900   CULT  10/02/2022 0900    NO GROWTH 5 DAYS Performed at [facility], [address], [place], [place] [address]    REPTSTATUS 10/07/2022 FINAL 10/02/2022 0900   REPTSTATUS 10/02/2022 FINAL 10/02/2022 0900    Cardiac Enzymes: No results for input(s):  "CKTOTAL", "CKMB", "CKMBINDEX", "TROPONINI" in the last 168 hours. CBG: No results for input(s): "GLUCAP" in the last 168 hours. Iron  Studies: No results for input(s): "IRON ", "TIBC", "TRANSFERRIN", "FERRITIN" in the last 72 hours. @lablastinr3 @ Studies/Results: No results found. Medications:  magnesium  sulfate bolus IVPB      amiodarone   200 mg Oral Q2200   apixaban   5 mg Oral BID   aspirin  EC  81 mg Oral Daily   atorvastatin   40 mg Oral Q2200   Chlorhexidine  Gluconate Cloth  6 each Topical Q0600   cinacalcet   60 mg Oral QHS   clopidogrel   75 mg Oral Q2200   docusate sodium   100 mg Oral Daily   ferric citrate   210 mg Oral TID WC   multivitamin  1 tablet Oral QHS   pantoprazole   40 mg Oral Daily    Dialysis Orders: [facility] M[facility] 3h  B400   66.5kg   L AVF   Heparin  3000  Assessment/Plan: Ischemic left lower extremity: S/P surgical revasc VVS 09/25/23 ESRD: on HD MWF.  HD yesterday. Next HD Monday.  BP: BP controlled, not on antihypertensive medications  Volume: Bilateral edema earlier but R  leg is signficantly improved. Continue UF with HD as tolerated  Anemia of esrd: Hb 10.4, no ESA indicated at this time Secondary hyperparathyroidism: CCa and phos in range, cont binders ac.    Ramona Burner, PA-C 09/29/2023, 11:52 AM  [facility] Pager: [phone]

## 2023-09-29 NOTE — Plan of Care (Signed)
  Problem: Education: Goal: Knowledge of prescribed regimen will improve Outcome: Progressing   Problem: Activity: Goal: Ability to tolerate increased activity will improve Outcome: Progressing   Problem: Bowel/Gastric: Goal: Gastrointestinal status for postoperative course will improve Outcome: Progressing   Problem: Clinical Measurements: Goal: Postoperative complications will be avoided or minimized Outcome: Progressing Goal: Signs and symptoms of graft occlusion will improve Outcome: Progressing   Problem: Skin Integrity: Goal: Demonstration of wound healing without infection will improve Outcome: Progressing   Problem: Education: Goal: Knowledge of General Education information will improve Description: Including pain rating scale, medication(s)/side effects and non-pharmacologic comfort measures Outcome: Progressing   Problem: Health Behavior/Discharge Planning: Goal: Ability to manage health-related needs will improve Outcome: Progressing   Problem: Clinical Measurements: Goal: Ability to maintain clinical measurements within normal limits will improve Outcome: Progressing Goal: Will remain free from infection Outcome: Progressing Goal: Diagnostic test results will improve Outcome: Progressing Goal: Respiratory complications will improve Outcome: Progressing Goal: Cardiovascular complication will be avoided Outcome: Progressing   Problem: Activity: Goal: Risk for activity intolerance will decrease Outcome: Progressing   Problem: Nutrition: Goal: Adequate nutrition will be maintained Outcome: Progressing   Problem: Coping: Goal: Level of anxiety will decrease Outcome: Progressing   Problem: Elimination: Goal: Will not experience complications related to bowel motility Outcome: Progressing Goal: Will not experience complications related to urinary retention Outcome: Progressing   Problem: Pain Managment: Goal: General experience of comfort will  improve and/or be controlled Outcome: Progressing   Problem: Safety: Goal: Ability to remain free from injury will improve Outcome: Progressing   Problem: Skin Integrity: Goal: Risk for impaired skin integrity will decrease Outcome: Progressing

## 2023-09-29 NOTE — Plan of Care (Signed)
  Problem: Education: Goal: Knowledge of prescribed regimen will improve 09/29/2023 2256 by Les Rao, RN Outcome: Progressing 09/29/2023 2255 by Les Rao, RN Outcome: Progressing   Problem: Activity: Goal: Ability to tolerate increased activity will improve 09/29/2023 2256 by Les Rao, RN Outcome: Progressing 09/29/2023 2255 by Les Rao, RN Outcome: Progressing   Problem: Clinical Measurements: Goal: Postoperative complications will be avoided or minimized 09/29/2023 2256 by Les Rao, RN Outcome: Progressing 09/29/2023 2255 by Les Rao, RN Outcome: Progressing   Problem: Skin Integrity: Goal: Demonstration of wound healing without infection will improve 09/29/2023 2256 by Les Rao, RN Outcome: Progressing 09/29/2023 2255 by Les Rao, RN Outcome: Progressing   Problem: Education: Goal: Knowledge of General Education information will improve Description: Including pain rating scale, medication(s)/side effects and non-pharmacologic comfort measures Outcome: Progressing   Problem: Health Behavior/Discharge Planning: Goal: Ability to manage health-related needs will improve 09/29/2023 2256 by Les Rao, RN Outcome: Progressing 09/29/2023 2255 by Les Rao, RN Outcome: Progressing

## 2023-09-30 ENCOUNTER — Other Ambulatory Visit (HOSPITAL_COMMUNITY): Payer: Self-pay

## 2023-09-30 MED ORDER — ASPIRIN 81 MG PO TBEC
81.0000 mg | DELAYED_RELEASE_TABLET | Freq: Every day | ORAL | 12 refills | Status: DC
  Filled 2023-09-30: qty 30, 30d supply, fill #0

## 2023-09-30 MED ORDER — HEPARIN SODIUM (PORCINE) 1000 UNIT/ML DIALYSIS
2000.0000 [IU] | Freq: Once | INTRAMUSCULAR | Status: AC
  Administered 2023-09-30: 2000 [IU] via INTRAVENOUS_CENTRAL

## 2023-09-30 MED ORDER — HEPARIN SODIUM (PORCINE) 1000 UNIT/ML IJ SOLN
INTRAMUSCULAR | Status: AC
  Filled 2023-09-30: qty 2

## 2023-09-30 MED ORDER — ALBUMIN HUMAN 25 % IV SOLN
25.0000 g | Freq: Once | INTRAVENOUS | Status: AC
  Administered 2023-09-30: 25 g via INTRAVENOUS

## 2023-09-30 MED ORDER — HYDROCODONE-ACETAMINOPHEN 5-325 MG PO TABS
1.0000 | ORAL_TABLET | Freq: Four times a day (QID) | ORAL | 0 refills | Status: DC | PRN
  Filled 2023-09-30: qty 30, 8d supply, fill #0

## 2023-09-30 NOTE — TOC Transition Note (Signed)
 Transition of Care (TOC) - Discharge Note Sherin Dingwall RN, BSN Transitions of Care Unit 4E- RN Case Manager See Treatment Team for direct phone #   Patient Details  Name: Gloria Gallagher MRN: [id number] Date of Birth: 1951/05/08  Transition of Care [facility]) CM/SW Contact:  Rox Cope, RN Phone Number: 09/30/2023, 12:10 PM   Clinical Narrative:    Pt stable for transition home today, plan to discharge after HD today.  Order placed for HHPT needs.   Pt returned to room from HD- CM in to speak with pt at bedside for [facility] needs.  List provided for [facility] choice- Per CMS guidelines from [email or web address] website with star ratings (copy placed in shadow chart)- per pt she has had HH in past- does not remember name but would like to use them again. Per chart review- pt used [facility] in May of 2024.   Pt voiced she has DME at home including home 02 w/ [employer]- no new DME needs noted at this time.   Address, phone # and PCP all confirmed. Pt voiced she lives with her sister, daughter will provide transport home.   Call made to [facility] liaison- referral has been accepted. [facility] will reach out to pt to schedule start of care.   No further TOC intervention needs noted.    Final next level of care: Home w Home Health Services Barriers to Discharge: No Barriers Identified   Patient Goals and CMS Choice Patient states their goals for this hospitalization and ongoing recovery are:: return home CMS Medicare.gov Compare Post Acute Care list provided to:: Patient Choice offered to / list presented to : Patient      Discharge Placement                 Home w/ [facility]      Discharge Plan and Services Additional resources added to the After Visit Summary for   In-house Referral: Clinical Social Work Discharge Planning Services: CM Consult Post Acute Care Choice: Home Health          DME Arranged: N/A DME Agency: NA       HH Arranged: PT HH Agency: [facility]  Home Health Date HH Agency Contacted: 09/30/23 Time HH Agency Contacted: 1209 Representative spoke with at [facility] Agency: Loetta Ringer  Social Drivers of Health (SDOH) Interventions SDOH Screenings   Food Insecurity: Patient Declined (09/26/2023)  Housing: Patient Declined (09/26/2023)  Transportation Needs: Patient Declined (09/26/2023)  Utilities: Patient Declined (09/26/2023)  Social Connections: Patient Declined (09/26/2023)  Tobacco Use: Medium Risk (09/25/2023)     Readmission Risk Interventions    09/30/2023   12:10 PM  Readmission Risk Prevention Plan  Post Dischage Appt Complete  Medication Screening Complete  Transportation Screening Complete

## 2023-09-30 NOTE — Progress Notes (Signed)
 [facility] Progress Note   Subjective:   Seen and examined on dialysis.  Blood pressure 90/55 and HR 79.  Tolerating goal.  Procedure supervised.  Note plans for likely discharge later today.  Patient confirms plans.  Giving albumin  to optimize BP   Review of systems:  Denies shortness of breath or chest pain  Denies n/v   Objective Vitals:   09/30/23 0758 09/30/23 0800 09/30/23 0830 09/30/23 0900  BP: (!) 89/55 (!) 91/51 (!) 96/51 (!) 108/49  Pulse: 85  75 84  Resp: (!) 22 18 (!) 25 (!) 22  Temp:      TempSrc:      SpO2: 100%  97%   Weight:      Height:       Physical Exam   General adult female in bed in no acute distress HEENT normocephalic atraumatic extraocular movements intact sclera anicteric Neck supple trachea midline Lungs clear to auscultation bilaterally normal work of breathing at rest on room air Heart S1S2 no rub Abdomen soft nontender nondistended Extremities 1-2+ edema LLE; no edema RLE Psych normal mood and affect Neuro alert and oriented x 3 provides hx and follows commands  Access: LUE AVF in use   Additional Objective Labs: Basic Metabolic Panel: Recent Labs  Lab 09/25/23 0717 09/25/23 0743 09/26/23 0430 09/27/23 0517  NA 138 135 136 132*  K 4.0 4.0 4.9 3.6  CL 93* 95* 95* 93*  CO2 28  --  26 27  GLUCOSE 80 75 98 105*  BUN 42* 48* 52* 29*  CREATININE 8.39* 8.30* 9.50*  9.46* 6.90*  CALCIUM  10.3  --  9.0 8.1*  PHOS  --   --  5.5*  --    Liver Function Tests: Recent Labs  Lab 09/25/23 0717  AST 19  ALT 12  ALKPHOS 141*  BILITOT 0.7  PROT 7.5  ALBUMIN  3.8   No results for input(s): "LIPASE", "AMYLASE" in the last 168 hours. CBC: Recent Labs  Lab 09/25/23 0717 09/25/23 0743 09/26/23 0430 09/27/23 0517  WBC 4.4  --  5.0 5.7  HGB 12.9 15.0 10.2* 10.4*  HCT 41.7 44.0 32.4* 32.3*  MCV 96.1  --  93.9 94.2  PLT 164  --  165 140*   Blood Culture    Component Value Date/Time   SDES FLUID 10/02/2022 0900    SDES FLUID 10/02/2022 0900   SPECREQUEST NONE 10/02/2022 0900   SPECREQUEST NONE 10/02/2022 0900   CULT  10/02/2022 0900    NO GROWTH 5 DAYS Performed at [facility], [address] [place], [place] [address]    REPTSTATUS 10/07/2022 FINAL 10/02/2022 0900   REPTSTATUS 10/02/2022 FINAL 10/02/2022 0900    Cardiac Enzymes: No results for input(s): "CKTOTAL", "CKMB", "CKMBINDEX", "TROPONINI" in the last 168 hours. CBG: Recent Labs  Lab 09/29/23 2100  GLUCAP 101*   Iron  Studies: No results for input(s): "IRON ", "TIBC", "TRANSFERRIN", "FERRITIN" in the last 72 hours. @lablastinr3 @ Studies/Results: No results found. Medications:  magnesium  sulfate bolus IVPB      amiodarone   200 mg Oral Q2200   apixaban   5 mg Oral BID   aspirin  EC  81 mg Oral Daily   atorvastatin   40 mg Oral Q2200   Chlorhexidine  Gluconate Cloth  6 each Topical Q0600   Chlorhexidine  Gluconate Cloth  6 each Topical Q0600   cinacalcet   60 mg Oral QHS   clopidogrel   75 mg Oral Q2200   docusate sodium   100 mg Oral Daily  ferric citrate   210 mg Oral TID WC   multivitamin  1 tablet Oral QHS   pantoprazole   40 mg Oral Daily    Dialysis Orders: [facility] M[facility] 3h  B400   66.5kg   L AVF   Heparin  3000  Assessment/Plan: Ischemic left lower extremity: S/P surgical revasc [facility] 09/25/23 ESRD: on HD MWF schedule.  See labs ordered for today BP: controlled Volume: Bilateral edema earlier but R leg is signficantly improved. 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   Disposition - per primary team  Nan Aver, MD 9:33 AM 09/30/2023

## 2023-09-30 NOTE — Progress Notes (Addendum)
  Progress Note    09/30/2023 7:43 AM 5 Days Post-Op  Subjective: no complaints, headed to HD   Vitals:   09/30/23 0340 09/30/23 0659  BP: (!) 91/53   Pulse: 89   Resp: 20 (!) 21  Temp: 98.4 F (36.9 C)   SpO2: 92%    Physical Exam: Cardiac:  regular Lungs:  non labored Incisions:  left groin, left BK popliteal incision c/d/I with steri strips in place Extremities:  LLE well perfused and war, edematous  Abdomen:  soft Neurologic: alert and oriented  CBC    Component Value Date/Time   WBC 5.7 09/27/2023 0517   RBC 3.43 (L) 09/27/2023 0517   HGB 10.4 (L) 09/27/2023 0517   HGB 11.6 (L) 10/25/2022 1511   HGB 11.5 11/28/2021 0928   HCT 32.3 (L) 09/27/2023 0517   HCT 34.6 11/28/2021 0928   PLT 140 (L) 09/27/2023 0517   PLT 165 10/25/2022 1511   PLT 169 11/28/2021 0928   MCV 94.2 09/27/2023 0517   MCV 91 11/28/2021 0928   MCH 30.3 09/27/2023 0517   MCHC 32.2 09/27/2023 0517   RDW 15.8 (H) 09/27/2023 0517   RDW 13.4 11/28/2021 0928   LYMPHSABS 1.0 09/19/2023 1455   MONOABS 0.4 09/19/2023 1455   EOSABS 0.0 09/19/2023 1455   BASOSABS 0.0 09/19/2023 1455    BMET    Component Value Date/Time   NA 132 (L) 09/27/2023 0517   NA 142 11/28/2021 0928   K 3.6 09/27/2023 0517   CL 93 (L) 09/27/2023 0517   CO2 27 09/27/2023 0517   GLUCOSE 105 (H) 09/27/2023 0517   BUN 29 (H) 09/27/2023 0517   BUN 45 (H) 11/28/2021 0928   CREATININE 6.90 (H) 09/27/2023 0517   CREATININE 6.42 (H) 10/25/2022 1511   CALCIUM  8.1 (L) 09/27/2023 0517   GFRNONAA 6 (L) 09/27/2023 0517   GFRNONAA 6 (L) 10/25/2022 1511    INR    Component Value Date/Time   INR 1.2 09/25/2023 0717     Intake/Output Summary (Last 24 hours) at 09/30/2023 0743 Last data filed at 09/29/2023 1200 Gross per 24 hour  Intake 240 ml  Output 0 ml  Net 240 ml     Assessment/Plan:  73 y.o. female is s/p  L femoral endarterectomy with femoral to BK popliteal with PTFE   5 Days Post-Op   Overall progressing  well Headed to HD this morning Left leg well perfused and warm  Left groin, left below knee incisions c/d/I without swelling or hematoma Pain well controlled Mobilizing better Plan is for discharge home today after HD Eliquis , Aspirin , Statin and Plavix  She will follow up in our office in 2-3 weeks for incision check   Deneen Finical, PA-C [facility] [phone] 09/30/2023 7:43 AM  VASCULAR STAFF ADDENDUM: I have independently interviewed and examined the patient. I agree with the above.  Home after HD  Heber Little. Edgardo Goodwill, MD [facility] Phone Number: [phone] 09/30/2023 12:15 PM

## 2023-09-30 NOTE — Procedures (Signed)
 Received patient in bed to unit.  Alert and oriented.  Informed consent signed and in chart.   TX duration: 3 hours 15 min (MD notified)   Patient tolerated well.  Transported back to the room  Alert, without acute distress.  Hand-off given to patient's nurse.   Access used: left fistula Access issues: none  Total UF removed: 2 liters Medication(s) given: albumin , heparin   Clover Dao, RN [facility]

## 2023-09-30 NOTE — Discharge Instructions (Signed)

## 2023-09-30 NOTE — Progress Notes (Signed)
 D/C order noted. Contacted [facility] to be advised of pt's d/c today and that pt should resume care on Wednesday.   Lauraine Polite Renal Navigator [phone]

## 2023-10-01 ENCOUNTER — Ambulatory Visit: Admitting: Vascular Surgery

## 2023-10-01 ENCOUNTER — Inpatient Hospital Stay (HOSPITAL_COMMUNITY)

## 2023-10-01 ENCOUNTER — Telehealth: Payer: Self-pay | Admitting: Nurse Practitioner

## 2023-10-01 ENCOUNTER — Inpatient Hospital Stay (HOSPITAL_COMMUNITY): Admission: RE | Admit: 2023-10-01 | Source: Ambulatory Visit

## 2023-10-01 NOTE — Telephone Encounter (Signed)
 I spoke to Gloria Gallagher to remind her of HD appointment. She said that they are arranging transportation to accommodate her. She states that she will be at her regualr MWF HD.

## 2023-10-01 NOTE — Discharge Planning (Signed)
 [facility] Patient Discharge Orders - [facility] CLINIC: [place] MWF  Patient's name: Gloria Gallagher Admit/DC Dates: 09/25/2023 - 09/30/2023  DISCHARGE DIAGNOSES: Ischemic left lower extremity: S/P surgical revasc [facility] 09/25/23   ESRD on HD  HD ORDER CHANGES: Heparin  change: no EDW Change: yes New EDW: 65.4 kg [place] Change: no  ANEMIA MANAGEMENT: Aranesp: Not given ESA dose for discharge: mircera per protocol IV Iron  dose at discharge: 50 mg 1 x week Transfusion: Given: none given  BONE/MINERAL MEDICATIONS: Hectorol /Calcitriol change: per protocol Sensipar /Parsabiv change: per protocol  ACCESS INTERVENTION/CHANGE: no change Details: L FA AVF   RECENT LABS: Recent Labs  Lab 09/25/23 0717 09/25/23 0743 09/26/23 0430 09/27/23 0517  HGB 12.9   < > 10.2* 10.4*  NA 138   < > 136 132*  K 4.0   < > 4.9 3.6  CALCIUM  10.3  --  9.0 8.1*  PHOS  --   --  5.5*  --   ALBUMIN  3.8  --   --   --    < > = values in this interval not displayed.    IV ANTIBIOTICS: no Details:  OTHER ANTICOAGULATION: On Coumadin?: no Last INR: Managed By:  OTHER/APPTS/LAB ORDERS: Please get updated labs at HD   D/C Meds to be reconciled by nurse after every discharge.  Completed By: Hersey Lorenzo, NP-C   Reviewed by: MD:______ RN_______

## 2023-10-02 ENCOUNTER — Telehealth: Payer: Self-pay

## 2023-10-02 NOTE — Telephone Encounter (Signed)
 Pt is needing our office to fill out a form for her van/transportation by the end of the month to continue this service. I have given them our fax number to send this form to us .

## 2023-10-02 NOTE — Telephone Encounter (Signed)
 Called pt's daughter back regarding the form for transportation to let her know this would need to go through pt's PCP. She verbalized understanding. No further questions/concerns.

## 2023-10-02 NOTE — Discharge Summary (Signed)
 [facility] Discharge Summary Patient ID: Gloria Gallagher [id number] 73 y.o. 1950-12-07  Admit date: 09/25/2023  Discharge date and time: 09/30/2023  3:59 PM   Admitting Physician: Gloria Che, MD   Discharge Physician: Gloria Countryman, MD  Admission Diagnoses: Atherosclerosis of native artery of left lower extremity with rest pain [facility]) [I70.222] Status post femoral-popliteal bypass surgery [Z95.828] Critical limb ischemia of left lower extremity (HCC) [I70.222]  Discharge Diagnoses: Atherosclerosis of native artery of left lower extremity with rest pain (HCC) [I70.222] Status post femoral-popliteal bypass surgery [Z95.828] Critical limb ischemia of left lower extremity (HCC) [I70.222]  Admission Condition: stable  Discharged Condition: good  Indication for Admission: Gloria Gallagher is a 73 y.o. female with left foot ischemic ulceration.  Noninvasive testing and angiography she has severe peripheral arterial disease, suitable for femoral-popliteal bypass grafting.. After careful discussion of risks, benefits, and alternatives the patient was offered femoral-popliteal bypass grafting. The patient understood and wished to proceed.   Gallagher Course: Gloria Gallagher was admitted on 09/25/22 and underwent 1) left femoral endarterectomy 2) left common femoral to below-knee popliteal artery with 6 mm PTFE in subfascial tunnel by Gloria Gallagher. She tolerated the surgery well and was taken to the recovery room in stable condition. Nephrology was consulted post operatively for inpatient hemodialysis. No need for urgent dialysis so this was scheduled for POD#1.  POD#1 some incisional pain but overall pain well controlled. Left groin and below knee incision intact and well appearing without swelling or hematoma. Brisk left PT doppler signal. Tolerated HD session. Remained hemodynamically stable. Encouraged mobilization. PT/ OT evaluated her and PT recommended Home Health PT. No OT follow up  POD#2,  continued improvement of pain control. Incisions intact. Left leg well perfused and warm. Eliquis  resumed. Continued on Aspirin  and Plavix  additionally. Slow to progress with mobility. Felt like she could use another day to help increase her strength before discharge home. Tolerated working with mobility specialist. Orders placed for Gloria Gallagher PT.   Remainder of Gallagher stay consisted of continued pain control, increased po intake, routine HD management per Nephrology on her MWF outpatient schedule, and increased mobilization  By POD# 5, she remained stable for discharge home. She underwent HD prior to discharge to maintain her usual MWF outpatient schedule. She will have her next session on Wednesday 5/21.  Her mobility improved. Pain overall well controlled. Left leg with continued adequate perfusion. Incisions all intact and well appearing. Instructions provided for post operative incision site care. HH arranged with Gloria Gallagher. PDMP reviewed and post operative pain medication sent to her pharmacy. She will discharge on Aspirin , Plavix  and Eliquis . She will resume home medications as prescribed. She will have follow up arranged in 2-3 weeks for incision check.   Consults: nephrology  Treatments: antibiotics: vancomycin , analgesia: acetaminophen  and Aspirin , oxycodone , anticoagulation: heparin  and Apixaban , therapies: PT, OT, RN, and SW, dialysis: Hemodialysis, and surgery: 1) left femoral endarterectomy 2) left common femoral to below-knee popliteal artery with 6 mm PTFE in subfascial tunnel     Disposition: Discharge disposition: 01-Home or Self Care       - For [facility] Registry use ---  Post-op:  Wound infection: No  Graft infection: No  Transfusion: No  If yes, 0 units given New Arrhythmia: No Patency judged by: [ X] Dopper only, [ ]  Palpable graft pulse, [ ]  Palpable distal pulse, [ ]  ABI inc. > 0.15, [ ]  Duplex D/C Ambulatory Status: Ambulatory with Assistance  Complications: MI: Gloria Gallagher ]  No, [ ]  Troponin  only, [ ]  EKG or Clinical CHF: No Resp failure: [x ] none, [ ]  Pneumonia, [ ]  Ventilator Chg in renal function: [ X] none, [ ]  Inc. Cr > 0.5, [ ]  Temp. Dialysis, [ ]  Permanent dialysis Stroke: Gloria Gallagher ] None, [ ]  Minor, [ ]  Major Return to OR: No  Reason for return to OR: [ ]  Bleeding, [ ]  Infection, [ ]  Thrombosis, [ ]  Revision  Discharge medications: Statin use:  Yes ASA use:  Yes Plavix  use:  Yes Beta blocker use: No  for medical reason not indicated Coumadin use: No  for medical reason not indicated    Patient Instructions:  Allergies as of 09/30/2023       Reactions   Penicillin G Hives, Itching   Shrimp (diagnostic) Hives        Medication List     TAKE these medications    acetaminophen -codeine 300-15 MG tablet Commonly known as: TYLENOL  #2 Take 1 tablet by mouth every 4 (four) hours as needed for moderate pain (pain score 4-6).   amiodarone  200 MG tablet Commonly known as: PACERONE  TAKE 1 TABLET BY MOUTH DAILY What changed: when to take this   aspirin  EC 81 MG tablet Take 1 tablet (81 mg total) by mouth daily. Swallow whole.   atorvastatin  20 MG tablet Commonly known as: LIPITOR Take 20 mg by mouth every evening.   cilostazol  50 MG tablet Commonly known as: PLETAL  Take 1 tablet (50 mg total) by mouth 2 (two) times daily.   cinacalcet  60 MG tablet Commonly known as: SENSIPAR  Take 60 mg by mouth at bedtime.   clopidogrel  75 MG tablet Commonly known as: Plavix  Take 1 tablet (75 mg total) by mouth daily. What changed: when to take this   doxycycline  50 MG capsule Commonly known as: VIBRAMYCIN  Take 100 mg by mouth 2 (two) times daily.   Eliquis  5 MG Tabs tablet Generic drug: apixaban  TAKE 1 TABLET BY MOUTH TWICE  DAILY   ferric citrate  1 GM 210 MG(Fe) tablet Commonly known as: AURYXIA  Take 1 tablet (210 mg total) by mouth 3 (three) times daily with meals. What changed: how much to take   HYDROcodone -acetaminophen  5-325 MG  tablet Commonly known as: NORCO/VICODIN Take 1 tablet by mouth every 6 (six) hours as needed for severe pain (pain score 7-10). What changed: reasons to take this   multivitamin Tabs tablet Take 1 tablet by mouth at bedtime.               Discharge Care Instructions  (From admission, onward)           Start     Ordered   09/30/23 0000  Discharge wound care:       Comments: You can shower or wash incisions with mild soap and water, pat dry. Do not soak in bathtub   09/30/23 0748           Activity: activity as tolerated, no driving while on analgesics, and no heavy lifting for 6 weeks Diet: low fat, low cholesterol diet and renal diet Wound Care: Wash incisions with mild soap and water, pat dry. Do not soak in bathtub  Follow-up with [facility] in 2-3 weeks.  Signed: Deneen Finical 10/02/2023 2:30 PM

## 2023-10-03 ENCOUNTER — Telehealth: Payer: Self-pay

## 2023-10-03 NOTE — Telephone Encounter (Signed)
 Spoke to pt's sister regarding disability/transportation forms they are requesting from us . I have advised them to have pt's PCP fill these out. I explained to her that we can fill them out for a short term period-through the post op period but they are wanting it for long term. They are going to reach back out to PCP. All questions/concerns answered.

## 2023-10-15 ENCOUNTER — Other Ambulatory Visit: Payer: Self-pay | Admitting: *Deleted

## 2023-10-15 DIAGNOSIS — I70222 Atherosclerosis of native arteries of extremities with rest pain, left leg: Secondary | ICD-10-CM

## 2023-10-15 DIAGNOSIS — I739 Peripheral vascular disease, unspecified: Secondary | ICD-10-CM

## 2023-10-31 ENCOUNTER — Ambulatory Visit (HOSPITAL_COMMUNITY)
Admission: RE | Admit: 2023-10-31 | Discharge: 2023-10-31 | Disposition: A | Discharge status: Home / Self Care | Source: Ambulatory Visit | Attending: Vascular Surgery | Admitting: Vascular Surgery

## 2023-10-31 ENCOUNTER — Ambulatory Visit (HOSPITAL_COMMUNITY)
Admission: RE | Admit: 2023-10-31 | Discharge: 2023-10-31 | Discharge status: Home / Self Care | Source: Ambulatory Visit | Attending: Vascular Surgery

## 2023-10-31 DIAGNOSIS — I70222 Atherosclerosis of native arteries of extremities with rest pain, left leg: Secondary | ICD-10-CM

## 2023-10-31 DIAGNOSIS — I739 Peripheral vascular disease, unspecified: Secondary | ICD-10-CM | POA: Insufficient documentation

## 2023-10-31 LAB — VAS US ABI WITH/WO TBI
Left ABI: 1.06
Right ABI: 0.46

## 2023-11-04 NOTE — Progress Notes (Unsigned)
 POST OPERATIVE OFFICE NOTE    CC:  F/u for surgery  HPI:  This is a 73 y.o. female who is s/p left femoral endarterectomy with femoral to below-knee popliteal bypass with PTFE by Dr. Magda on 09/25/2023 due to critical limb ischemia with ulceration of the toes of the left foot.  She had a relatively uneventful hospital.  Discharge was somewhat delayed due to difficulty with postoperative pain control.  She believes her toe wounds have healed.  Incisions have also healed.  She is complaining of LLE edema but otherwise doing well.  She is on aspirin , Plavix , Eliquis  daily.  Allergies  Allergen Reactions   Penicillin G Hives and Itching   Shrimp (Diagnostic) Hives    Current Outpatient Medications  Medication Sig Dispense Refill   acetaminophen -codeine (TYLENOL  #2) 300-15 MG tablet Take 1 tablet by mouth every 4 (four) hours as needed for moderate pain (pain score 4-6).     amiodarone  (PACERONE ) 200 MG tablet TAKE 1 TABLET BY MOUTH DAILY (Patient taking differently: Take 200 mg by mouth every evening.) 100 tablet 3   apixaban  (ELIQUIS ) 5 MG TABS tablet TAKE 1 TABLET BY MOUTH TWICE  DAILY 200 tablet 0   aspirin  EC 81 MG tablet Take 1 tablet (81 mg total) by mouth daily. Swallow whole. 30 tablet 12   atorvastatin  (LIPITOR) 20 MG tablet Take 20 mg by mouth every evening.     cilostazol  (PLETAL ) 50 MG tablet Take 1 tablet (50 mg total) by mouth 2 (two) times daily. 60 tablet 11   cinacalcet  (SENSIPAR ) 60 MG tablet Take 60 mg by mouth at bedtime.     clopidogrel  (PLAVIX ) 75 MG tablet Take 1 tablet (75 mg total) by mouth daily. (Patient taking differently: Take 75 mg by mouth every evening.) 30 tablet 11   doxycycline  (VIBRAMYCIN ) 50 MG capsule Take 100 mg by mouth 2 (two) times daily.     ferric citrate  (AURYXIA ) 1 GM 210 MG(Fe) tablet Take 1 tablet (210 mg total) by mouth 3 (three) times daily with meals. (Patient taking differently: Take 630 mg by mouth 3 (three) times daily with meals.) 270  tablet 1   HYDROcodone -acetaminophen  (NORCO/VICODIN) 5-325 MG tablet Take 1 tablet by mouth every 6 (six) hours as needed for severe pain (pain score 7-10). 30 tablet 0   multivitamin (RENA-VIT) TABS tablet Take 1 tablet by mouth at bedtime. 100 tablet 0   Current Facility-Administered Medications  Medication Dose Route Frequency Provider Last Rate Last Admin   0.9 %  sodium chloride  infusion  250 mL Intravenous PRN Gretta Lonni PARAS, MD       sodium chloride  flush (NS) 0.9 % injection 3 mL  3 mL Intravenous Q12H Gretta Lonni PARAS, MD         ROS:  See HPI  Physical Exam:  Vitals:   11/05/23 0854  BP: 120/68  Pulse: 76  Temp: 97.9 F (36.6 C)  TempSrc: Temporal  SpO2: 90%  Weight: 152 lb 9.6 oz (69.2 kg)  Height: 5' 5 (1.651 m)    Incision:  L groin and popliteal incisions healed Extremities:  brisk L DP and PT by doppler; LLE edema  Abdomen:  soft  Left leg bypass duplex demonstrates a patent bypass  ABI/TBIToday's ABIToday's TBIPrevious ABIPrevious TBI  +-------+-----------+-----------+------------+------------+  Right 0.46       absent     0.40        absent        +-------+-----------+-----------+------------+------------+  Left  1.06  0.58       0.43        absent        +-------+-----------+-----------+------------+------------+    Assessment/Plan:  This is a 73 y.o. female who is s/p: L femoral endarterectomy with femoral to popliteal artery bypass with PTFE for toe ulcers  -Subjectively doing well other than LLE edema.  Toe ulcers have healed.  She has also healed her groin and popliteal incisions.  I have recommended leg elevation and knee-high compression for edema.  Duplex demonstrates a widely patent bypass and her ABI and TBI have drastically improved.  She denies any symptoms of claudication rest pain and does not have tissue loss of the right lower extremity.  We will repeat bypass duplex with ABI in 3 months.  She will notify us   with any questions or concerns in the meantime.   Donnice Sender, PA-C [facility] [phone]  Clinic MD:  Gretta

## 2023-11-05 ENCOUNTER — Ambulatory Visit: Attending: Vascular Surgery | Admitting: Physician Assistant

## 2023-11-05 ENCOUNTER — Encounter (HOSPITAL_COMMUNITY)

## 2023-11-05 VITALS — BP 120/68 | HR 76 | Temp 97.9°F | Ht 65.0 in | Wt 152.6 lb

## 2023-11-05 DIAGNOSIS — I70222 Atherosclerosis of native arteries of extremities with rest pain, left leg: Secondary | ICD-10-CM

## 2023-11-05 DIAGNOSIS — I998 Other disorder of circulatory system: Secondary | ICD-10-CM

## 2023-11-06 ENCOUNTER — Other Ambulatory Visit: Payer: Self-pay

## 2023-11-06 DIAGNOSIS — I70222 Atherosclerosis of native arteries of extremities with rest pain, left leg: Secondary | ICD-10-CM

## 2023-11-12 ENCOUNTER — Telehealth: Payer: Self-pay

## 2023-11-12 NOTE — Telephone Encounter (Signed)
 Gloria Gallagher, pt's sister, called asking if pt needs to continue her Plavix .  Per Dr. Magda, pt no longer needs to take her Plavix .  Patient notified.

## 2023-11-13 ENCOUNTER — Other Ambulatory Visit (HOSPITAL_COMMUNITY): Payer: Self-pay | Admitting: Cardiovascular Disease

## 2023-11-13 DIAGNOSIS — I4819 Other persistent atrial fibrillation: Secondary | ICD-10-CM

## 2023-11-14 NOTE — Telephone Encounter (Signed)
 Prescription refill request for Eliquis  received. Indication: afib  Last office visit: [place], 02/19/2023 Scr: 6.9, 09/27/2023 Age: 73 yo  Weight: 69.2 kg   Refill sent.

## 2023-12-03 ENCOUNTER — Encounter: Admitting: Vascular Surgery

## 2023-12-03 ENCOUNTER — Encounter (HOSPITAL_COMMUNITY)

## 2023-12-26 ENCOUNTER — Telehealth: Payer: Self-pay

## 2023-12-26 NOTE — Telephone Encounter (Signed)
 Pt came in today as she thought her three month f/u was today but it is not until Oct. She asked how to manage the swelling. I called her after she had left office and spoke to her and her sister Rock. She states her swelling is unchanged since her last appt with APP, who advised her to elevate and wear compression. I reiterated this. She does not sound like she is doing either of those regularly but advised her to try to do that. She verbalized understanding, no further questions/concerns at this time.

## 2023-12-31 ENCOUNTER — Encounter (HOSPITAL_COMMUNITY): Payer: 59

## 2023-12-31 ENCOUNTER — Ambulatory Visit: Payer: 59

## 2024-01-21 ENCOUNTER — Ambulatory Visit

## 2024-01-21 ENCOUNTER — Other Ambulatory Visit (HOSPITAL_COMMUNITY)

## 2024-01-21 ENCOUNTER — Encounter (HOSPITAL_COMMUNITY)

## 2024-02-25 ENCOUNTER — Ambulatory Visit: Attending: Vascular Surgery | Admitting: Physician Assistant

## 2024-02-25 ENCOUNTER — Ambulatory Visit (HOSPITAL_COMMUNITY): Admission: RE | Admit: 2024-02-25 | Discharge: 2024-02-25 | Attending: Vascular Surgery

## 2024-02-25 ENCOUNTER — Ambulatory Visit (HOSPITAL_COMMUNITY)
Admission: RE | Admit: 2024-02-25 | Discharge: 2024-02-25 | Disposition: A | Discharge status: Home / Self Care | Source: Ambulatory Visit | Attending: Vascular Surgery | Admitting: Vascular Surgery

## 2024-02-25 VITALS — BP 137/78 | HR 102 | Temp 97.9°F | Resp 18 | Ht 65.0 in

## 2024-02-25 DIAGNOSIS — I998 Other disorder of circulatory system: Secondary | ICD-10-CM | POA: Diagnosis not present

## 2024-02-25 DIAGNOSIS — N186 End stage renal disease: Secondary | ICD-10-CM

## 2024-02-25 DIAGNOSIS — Z992 Dependence on renal dialysis: Secondary | ICD-10-CM

## 2024-02-25 DIAGNOSIS — I70222 Atherosclerosis of native arteries of extremities with rest pain, left leg: Secondary | ICD-10-CM | POA: Diagnosis present

## 2024-02-25 LAB — VAS US ABI WITH/WO TBI
Left ABI: 0.89
Right ABI: 0.45

## 2024-02-25 NOTE — Progress Notes (Signed)
 Office Note     CC:  follow up Requesting Provider:  Campbell Reynolds, NP  HPI: Gloria Gallagher is a 73 y.o. (August 16, 1950) female who presents status post left femoral endarterectomy with femoral to below the knee popliteal bypass with PTFE by Dr. Magda on 09/25/2023 due to critical limb ischemia with ulceration of the toes of the left foot.  She has since healed her toe wounds.  She has combined venous and arterial insufficiency thus is complaining of increasing left lower extremity edema since surgery.  She manages her swelling with compression and elevation.  She denies any open wounds to the left leg.  She is on aspirin , statin, Plavix , Eliquis .  She is on Eliquis  for atrial fibrillation.  Past medical history also significant for end-stage renal disease on hemodialysis.  She would like a referral to a different podiatrist.  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   Past Medical History:  Diagnosis Date   Acute hypoxic respiratory failure (HCC) 10/01/2022   Acute respiratory failure (HCC) 10/01/2022   Anemia    Aortic atherosclerosis    Arthritis    Atrial fibrillation (HCC)    CAD (coronary artery disease)    COVID 2021   mild   ESRD on hemodialysis (HCC)    MWF at [facility]   GERD (gastroesophageal reflux disease)    Gout    Heart murmur    Hyperparathyroidism    Hypertension    in the past, no longer takes medications   Neuromuscular disorder (HCC)    neuropathy   Pneumonia    Seasonal allergies     Past Surgical History:  Procedure Laterality Date   ABDOMINAL AORTOGRAM N/A 08/22/2023   Procedure: ABDOMINAL AORTOGRAM;  Surgeon: Gretta Lonni PARAS, MD;  Location: [facility] INVASIVE CV LAB;  Service: Cardiovascular;  Laterality: N/A;   ABDOMINAL AORTOGRAM W/LOWER EXTREMITY N/A 12/13/2022   Procedure: ABDOMINAL AORTOGRAM W/LOWER EXTREMITY;  Surgeon: Gretta Lonni PARAS, MD;  Location: [facility] INVASIVE CV LAB;  Service: Cardiovascular;   Laterality: N/A;   CARDIOVERSION N/A 05/16/2021   Procedure: CARDIOVERSION;  Surgeon: Alveta Aleene PARAS, MD;  Location: [facility] ENDOSCOPY;  Service: Cardiovascular;  Laterality: N/A;   CARDIOVERSION N/A 08/01/2021   Procedure: CARDIOVERSION;  Surgeon: Francyne Headland, MD;  Location: [facility] ENDOSCOPY;  Service: Cardiovascular;  Laterality: N/A;   CARDIOVERSION N/A 12/05/2021   Procedure: CARDIOVERSION;  Surgeon: Sheena Pugh, DO;  Location: [facility] ENDOSCOPY;  Service: Cardiovascular;  Laterality: N/A;   DIALYSIS FISTULA CREATION Left    FEMORAL-POPLITEAL BYPASS GRAFT Left 09/25/2023   Procedure: BYPASS GRAFT FEMORAL-POPLITEAL ARTERY;  Surgeon: Magda Debby SAILOR, MD;  Location[facility];  Service: Vascular;  Laterality: Left;   LOWER EXTREMITY ANGIOGRAPHY N/A 08/22/2023   Procedure: Lower Extremity Angiography;  Surgeon: Gretta Lonni PARAS, MD;  Location: [facility] INVASIVE CV LAB;  Service: Cardiovascular;  Laterality: N/A;   LOWER EXTREMITY INTERVENTION N/A 08/22/2023   Procedure: LOWER EXTREMITY INTERVENTION;  Surgeon: Gretta Lonni PARAS, MD;  Location: [facility] INVASIVE CV LAB;  Service: Cardiovascular;  Laterality: N/A;   PERICARDIOCENTESIS N/A 10/02/2022   Procedure: PERICARDIOCENTESIS;  Surgeon: Verlin Lonni BIRCH, MD;  Location: [facility] INVASIVE CV LAB;  Service: Cardiovascular;  Laterality: N/A;   PERIPHERAL INTRAVASCULAR LITHOTRIPSY  08/22/2023   Procedure: PERIPHERAL INTRAVASCULAR LITHOTRIPSY;  Surgeon: Gretta Lonni PARAS, MD;  Location: [facility] INVASIVE CV LAB;  Service: Cardiovascular;;   REVISON OF ARTERIOVENOUS FISTULA Left 02/16/2022   Procedure: LEFT ARM ARTERIOVENOUS FISTULA REVISION;  Surgeon: Magda Debby SAILOR, MD;  Location: [facility] OR;  Service: Vascular;  Laterality: Left;  PERIPHERAL NERVE BLOCK   TUBAL LIGATION      Social History   Socioeconomic History   Marital status: Single    Spouse name: Not on file   Number of children: 1   Years of education: Not on file   Highest education level: Not on file   Occupational History   Occupation: retired  Tobacco Use   Smoking status: Former    Types: Cigarettes    Start date: 2004    Passive exposure: Never   Smokeless tobacco: Never   Tobacco comments:    Former smoker 04/25/2021  Vaping Use   Vaping status: Never Used  Substance and Sexual Activity   Alcohol use: Not Currently    Alcohol/week: 1.0 standard drink of alcohol    Types: 1 Cans of beer per week    Comment: 1 beer every other week 06/20/21   Drug use: Never   Sexual activity: Not Currently  Other Topics Concern   Not on file  Social History Narrative   Not on file   Social Drivers of Health   Financial Resource Strain: Not on file  Food Insecurity: Patient Declined (09/26/2023)   Hunger Vital Sign    Worried About Running Out of Food in the Last Year: Patient declined    Ran Out of Food in the Last Year: Patient declined  Transportation Needs: Patient Declined (09/26/2023)   PRAPARE - Administrator, Civil Service (Medical): Patient declined    Lack of Transportation (Non-Medical): Patient declined  Physical Activity: Not on file  Stress: Not on file  Social Connections: Patient Declined (09/26/2023)   Social Connection and Isolation Panel    Frequency of Communication with Friends and Family: Patient declined    Frequency of Social Gatherings with Friends and Family: Patient declined    Attends Religious Services: Patient declined    Database administrator or Organizations: Patient declined    Attends Banker Meetings: Patient declined    Marital Status: Patient declined  Intimate Partner Violence: Patient Declined (09/26/2023)   Humiliation, Afraid, Rape, and Kick questionnaire    Fear of Current or Ex-Partner: Patient declined    Emotionally Abused: Patient declined    Physically Abused: Patient declined    Sexually Abused: Patient declined    Family History  Problem Relation Age of Onset   Diabetes Sister    Stomach cancer Neg Hx     Colon cancer Neg Hx     Current Outpatient Medications  Medication Sig Dispense Refill   acetaminophen -codeine (TYLENOL  #2) 300-15 MG tablet Take 1 tablet by mouth every 4 (four) hours as needed for moderate pain (pain score 4-6).     amiodarone  (PACERONE ) 200 MG tablet TAKE 1 TABLET BY MOUTH DAILY 90 tablet 0   apixaban  (ELIQUIS ) 5 MG TABS tablet TAKE 1 TABLET BY MOUTH TWICE  DAILY 200 tablet 1   aspirin  EC 81 MG tablet Take 1 tablet (81 mg total) by mouth daily. Swallow whole. 30 tablet 12   atorvastatin  (LIPITOR) 20 MG tablet Take 20 mg by mouth every evening.     cilostazol  (PLETAL ) 50 MG tablet Take 1 tablet (50 mg total) by mouth 2 (two) times daily. 60 tablet 11   cinacalcet  (SENSIPAR ) 60 MG tablet Take 60 mg by mouth at bedtime.     clopidogrel  (PLAVIX ) 75 MG tablet Take 1 tablet (75 mg total)  by mouth daily. (Patient taking differently: Take 75 mg by mouth every evening.) 30 tablet 11   doxycycline  (VIBRAMYCIN ) 50 MG capsule Take 100 mg by mouth 2 (two) times daily.     ferric citrate  (AURYXIA ) 1 GM 210 MG(Fe) tablet Take 1 tablet (210 mg total) by mouth 3 (three) times daily with meals. (Patient taking differently: Take 630 mg by mouth 3 (three) times daily with meals.) 270 tablet 1   HYDROcodone -acetaminophen  (NORCO/VICODIN) 5-325 MG tablet Take 1 tablet by mouth every 6 (six) hours as needed for severe pain (pain score 7-10). 30 tablet 0   multivitamin (RENA-VIT) TABS tablet Take 1 tablet by mouth at bedtime. 100 tablet 0   Current Facility-Administered Medications  Medication Dose Route Frequency Provider Last Rate Last Admin   0.9 %  sodium chloride  infusion  250 mL Intravenous PRN Gretta Lonni PARAS, MD       sodium chloride  flush (NS) 0.9 % injection 3 mL  3 mL Intravenous Q12H Gretta Lonni PARAS, MD        Allergies  Allergen Reactions   Penicillin G Hives and Itching   Shrimp (Diagnostic) Hives     REVIEW OF SYSTEMS:  Negative unless noted in HPI [X]  denotes  positive finding, [ ]  denotes negative finding Cardiac  Comments:  Chest pain or chest pressure:    Shortness of breath upon exertion:    Short of breath when lying flat:    Irregular heart rhythm:        Vascular    Pain in calf, thigh, or hip brought on by ambulation:    Pain in feet at night that wakes you up from your sleep:     Blood clot in your veins:    Leg swelling:         Pulmonary    Oxygen  at home:    Productive cough:     Wheezing:         Neurologic    Sudden weakness in arms or legs:     Sudden numbness in arms or legs:     Sudden onset of difficulty speaking or slurred speech:    Temporary loss of vision in one eye:     Problems with dizziness:         Gastrointestinal    Blood in stool:     Vomited blood:         Genitourinary    Burning when urinating:     Blood in urine:        Psychiatric    Major depression:         Hematologic    Bleeding problems:    Problems with blood clotting too easily:        Skin    Rashes or ulcers:        Constitutional    Fever or chills:      PHYSICAL EXAMINATION:  Vitals:   02/25/24 1337  BP: 137/78  Pulse: (!) 102  Resp: 18  Temp: 97.9 F (36.6 C)  TempSrc: Temporal  Height: 5' 5 (1.651 m)    General:  WDWN in NAD; vital signs documented above Gait: Not observed HENT: WNL, normocephalic Pulmonary: normal non-labored breathing Cardiac: regular HR Abdomen: soft, NT, no masses Skin: without rashes Vascular Exam/Pulses: brisk L DP and peroneal by doppler Extremities: without ischemic changes, without Gangrene , without cellulitis; without open wounds; LLE edema to the level of the knee Musculoskeletal: no muscle wasting or atrophy  Neurologic: A&O  X 3 Psychiatric:  The pt has Normal affect.   Non-Invasive Vascular Imaging:   Left lower extremity bypass duplex demonstrates a widely patent bypass with 220 cm/s at the proximal anastomosis and proximal graft  ABI/TBIToday's ABIToday's  TBIPrevious ABIPrevious TBI  +-------+-----------+-----------+------------+------------+  Right 0.45       absent     0.46        absent        +-------+-----------+-----------+------------+------------+  Left  0.89       0.57       1.06        0.58          +-------+-----------+-----------+------------+----------     ASSESSMENT/PLAN:: 73 y.o. female here for follow up for surveillance of PAD with history of left leg bypass in May of this year  Left lower extremity well-perfused with widely patent femoral to the below the knee popliteal bypass.  Duplex shows a mildly elevated velocity at the proximal anastomosis.  We will repeat duplex in 6 months as well as ABI.  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  I encouraged her to walk for exercise.  Also encouraged her to wear compression regularly and elevate her legs periodically to help manage edema of the left lower extremity since surgery.  She will continue her aspirin , Plavix , statin daily.  She will notify the office with any questions or concerns.  We will also refer her to a new podiatrist to establish with for foot and nail care   Donnice Sender, PA-C [facility] [phone]  Clinic MD:   Magda

## 2024-02-27 ENCOUNTER — Other Ambulatory Visit: Payer: Self-pay | Admitting: *Deleted

## 2024-02-27 DIAGNOSIS — I739 Peripheral vascular disease, unspecified: Secondary | ICD-10-CM

## 2024-02-27 DIAGNOSIS — I70222 Atherosclerosis of native arteries of extremities with rest pain, left leg: Secondary | ICD-10-CM

## 2024-03-16 ENCOUNTER — Inpatient Hospital Stay (HOSPITAL_COMMUNITY)

## 2024-03-16 ENCOUNTER — Emergency Department (HOSPITAL_COMMUNITY)

## 2024-03-16 ENCOUNTER — Inpatient Hospital Stay (HOSPITAL_COMMUNITY)
Admission: EM | Admit: 2024-03-16 | Discharge: 2024-03-28 | DRG: 471 | Disposition: A | Discharge status: Home Health | Attending: Internal Medicine | Admitting: Internal Medicine

## 2024-03-16 DIAGNOSIS — S12110A Anterior displaced Type II dens fracture, initial encounter for closed fracture: Secondary | ICD-10-CM | POA: Diagnosis present

## 2024-03-16 DIAGNOSIS — Z880 Allergy status to penicillin: Secondary | ICD-10-CM

## 2024-03-16 DIAGNOSIS — E871 Hypo-osmolality and hyponatremia: Secondary | ICD-10-CM | POA: Diagnosis present

## 2024-03-16 DIAGNOSIS — Z833 Family history of diabetes mellitus: Secondary | ICD-10-CM

## 2024-03-16 DIAGNOSIS — I5032 Chronic diastolic (congestive) heart failure: Secondary | ICD-10-CM | POA: Diagnosis present

## 2024-03-16 DIAGNOSIS — N186 End stage renal disease: Secondary | ICD-10-CM | POA: Diagnosis present

## 2024-03-16 DIAGNOSIS — W1830XA Fall on same level, unspecified, initial encounter: Secondary | ICD-10-CM

## 2024-03-16 DIAGNOSIS — R911 Solitary pulmonary nodule: Secondary | ICD-10-CM | POA: Diagnosis present

## 2024-03-16 DIAGNOSIS — N2581 Secondary hyperparathyroidism of renal origin: Secondary | ICD-10-CM | POA: Diagnosis present

## 2024-03-16 DIAGNOSIS — I132 Hypertensive heart and chronic kidney disease with heart failure and with stage 5 chronic kidney disease, or end stage renal disease: Secondary | ICD-10-CM | POA: Diagnosis present

## 2024-03-16 DIAGNOSIS — S12100A Unspecified displaced fracture of second cervical vertebra, initial encounter for closed fracture: Secondary | ICD-10-CM | POA: Diagnosis not present

## 2024-03-16 DIAGNOSIS — Z01810 Encounter for preprocedural cardiovascular examination: Secondary | ICD-10-CM | POA: Diagnosis not present

## 2024-03-16 DIAGNOSIS — Z87891 Personal history of nicotine dependence: Secondary | ICD-10-CM | POA: Diagnosis not present

## 2024-03-16 DIAGNOSIS — I77810 Thoracic aortic ectasia: Secondary | ICD-10-CM | POA: Diagnosis present

## 2024-03-16 DIAGNOSIS — Z91013 Allergy to seafood: Secondary | ICD-10-CM

## 2024-03-16 DIAGNOSIS — Z7902 Long term (current) use of antithrombotics/antiplatelets: Secondary | ICD-10-CM | POA: Diagnosis not present

## 2024-03-16 DIAGNOSIS — Z7982 Long term (current) use of aspirin: Secondary | ICD-10-CM | POA: Diagnosis not present

## 2024-03-16 DIAGNOSIS — W010XXA Fall on same level from slipping, tripping and stumbling without subsequent striking against object, initial encounter: Secondary | ICD-10-CM | POA: Diagnosis present

## 2024-03-16 DIAGNOSIS — W19XXXS Unspecified fall, sequela: Secondary | ICD-10-CM | POA: Diagnosis not present

## 2024-03-16 DIAGNOSIS — I2510 Atherosclerotic heart disease of native coronary artery without angina pectoris: Secondary | ICD-10-CM | POA: Diagnosis present

## 2024-03-16 DIAGNOSIS — D631 Anemia in chronic kidney disease: Secondary | ICD-10-CM | POA: Diagnosis present

## 2024-03-16 DIAGNOSIS — Y92009 Unspecified place in unspecified non-institutional (private) residence as the place of occurrence of the external cause: Secondary | ICD-10-CM

## 2024-03-16 DIAGNOSIS — S13120A Subluxation of C1/C2 cervical vertebrae, initial encounter: Secondary | ICD-10-CM

## 2024-03-16 DIAGNOSIS — S32509D Unspecified fracture of unspecified pubis, subsequent encounter for fracture with routine healing: Secondary | ICD-10-CM | POA: Diagnosis not present

## 2024-03-16 DIAGNOSIS — Z992 Dependence on renal dialysis: Secondary | ICD-10-CM | POA: Diagnosis not present

## 2024-03-16 DIAGNOSIS — W19XXXA Unspecified fall, initial encounter: Secondary | ICD-10-CM | POA: Diagnosis not present

## 2024-03-16 DIAGNOSIS — I480 Paroxysmal atrial fibrillation: Secondary | ICD-10-CM | POA: Diagnosis present

## 2024-03-16 DIAGNOSIS — I739 Peripheral vascular disease, unspecified: Secondary | ICD-10-CM | POA: Diagnosis present

## 2024-03-16 DIAGNOSIS — Z8616 Personal history of COVID-19: Secondary | ICD-10-CM | POA: Diagnosis not present

## 2024-03-16 DIAGNOSIS — S32592A Other specified fracture of left pubis, initial encounter for closed fracture: Secondary | ICD-10-CM | POA: Diagnosis present

## 2024-03-16 DIAGNOSIS — Z7901 Long term (current) use of anticoagulants: Secondary | ICD-10-CM

## 2024-03-16 DIAGNOSIS — R110 Nausea: Secondary | ICD-10-CM | POA: Diagnosis present

## 2024-03-16 DIAGNOSIS — Z79899 Other long term (current) drug therapy: Secondary | ICD-10-CM

## 2024-03-16 LAB — CBC
HCT: 41.4 % (ref 36.0–46.0)
Hemoglobin: 12.9 g/dL (ref 12.0–15.0)
MCH: 30 pg (ref 26.0–34.0)
MCHC: 31.2 g/dL (ref 30.0–36.0)
MCV: 96.3 fL (ref 80.0–100.0)
Platelets: 149 K/uL — ABNORMAL LOW (ref 150–400)
RBC: 4.3 MIL/uL (ref 3.87–5.11)
RDW: 15.3 % (ref 11.5–15.5)
WBC: 4.3 K/uL (ref 4.0–10.5)
nRBC: 0 % (ref 0.0–0.2)

## 2024-03-16 LAB — COMPREHENSIVE METABOLIC PANEL WITH GFR
ALT: 14 U/L (ref 0–44)
AST: 22 U/L (ref 15–41)
Albumin: 3.6 g/dL (ref 3.5–5.0)
Alkaline Phosphatase: 170 U/L — ABNORMAL HIGH (ref 38–126)
Anion gap: 16 — ABNORMAL HIGH (ref 5–15)
BUN: 24 mg/dL — ABNORMAL HIGH (ref 8–23)
CO2: 28 mmol/L (ref 22–32)
Calcium: 10.2 mg/dL (ref 8.9–10.3)
Chloride: 95 mmol/L — ABNORMAL LOW (ref 98–111)
Creatinine, Ser: 5.77 mg/dL — ABNORMAL HIGH (ref 0.44–1.00)
GFR, Estimated: 7 mL/min — ABNORMAL LOW (ref >60)
Glucose, Bld: 101 mg/dL — ABNORMAL HIGH (ref 70–99)
Potassium: 4.2 mmol/L (ref 3.5–5.1)
Sodium: 139 mmol/L (ref 135–145)
Total Bilirubin: 0.8 mg/dL (ref 0.0–1.2)
Total Protein: 7.5 g/dL (ref 6.5–8.1)

## 2024-03-16 LAB — I-STAT CHEM 8, ED
BUN: 27 mg/dL — ABNORMAL HIGH (ref 8–23)
Calcium, Ion: 1.09 mmol/L — ABNORMAL LOW (ref 1.15–1.40)
Chloride: 99 mmol/L (ref 98–111)
Creatinine, Ser: 5.8 mg/dL — ABNORMAL HIGH (ref 0.44–1.00)
Glucose, Bld: 100 mg/dL — ABNORMAL HIGH (ref 70–99)
HCT: 42 % (ref 36.0–46.0)
Hemoglobin: 14.3 g/dL (ref 12.0–15.0)
Potassium: 4.1 mmol/L (ref 3.5–5.1)
Sodium: 137 mmol/L (ref 135–145)
TCO2: 31 mmol/L (ref 22–32)

## 2024-03-16 LAB — I-STAT CG4 LACTIC ACID, ED: Lactic Acid, Venous: 2.8 mmol/L (ref 0.5–1.9)

## 2024-03-16 LAB — PROTIME-INR
INR: 1.4 — ABNORMAL HIGH (ref 0.8–1.2)
Prothrombin Time: 17.6 s — ABNORMAL HIGH (ref 11.4–15.2)

## 2024-03-16 LAB — SAMPLE TO BLOOD BANK

## 2024-03-16 LAB — ETHANOL: Alcohol, Ethyl (B): <15 mg/dL (ref <15)

## 2024-03-16 MED ORDER — IOHEXOL 350 MG/ML SOLN
75.0000 mL | Freq: Once | INTRAVENOUS | Status: AC | PRN
  Administered 2024-03-16: 75 mL via INTRAVENOUS

## 2024-03-16 MED ORDER — ACETAMINOPHEN 500 MG PO TABS: 500.0000 mg | ORAL_TABLET | Freq: Four times a day (QID) | ORAL | Status: DC | PRN

## 2024-03-16 MED ORDER — OXYCODONE HCL 5 MG PO TABS
5.0000 mg | ORAL_TABLET | Freq: Four times a day (QID) | ORAL | Status: DC | PRN
  Administered 2024-03-20 – 2024-03-22 (×2): 5 mg via ORAL
  Filled 2024-03-16 (×4): qty 1

## 2024-03-16 MED ORDER — HYDROMORPHONE HCL 1 MG/ML IJ SOLN
0.5000 mg | INTRAMUSCULAR | Status: AC
  Administered 2024-03-16: 0.5 mg via INTRAVENOUS
  Filled 2024-03-16: qty 1

## 2024-03-16 MED ORDER — PROCHLORPERAZINE EDISYLATE 10 MG/2ML IJ SOLN
5.0000 mg | Freq: Four times a day (QID) | INTRAMUSCULAR | Status: DC | PRN
  Administered 2024-03-17 – 2024-03-28 (×6): 5 mg via INTRAVENOUS
  Filled 2024-03-16 (×6): qty 2

## 2024-03-16 MED ORDER — HYDROMORPHONE HCL 1 MG/ML IJ SOLN: 0.5000 mg | INTRAMUSCULAR | Status: DC | PRN

## 2024-03-16 NOTE — Consult Note (Signed)
 Reason for Consult:left pubic rami fractures Referring Physician: Pamella, DO  Gloria Gallagher is an 73 y.o. female.  HPI: 51F s/p fall this AM.  Pt states she she fell to her left side.  States that she tripped and then fell. She denies any LOC.   Patient comes in with pelvic pain.   She has does have a past medical history significant for A-fib-on Eliquis ., CAD, incisional disease on dialysis, hypertension   Patient underwent CT scan which was significant for C1-C2 subluxation.  Neurosurgery was consulted and requested MRI.  Patient with left-sided pubic rami fracture.    Orthopedics consulted for pelvic ring fracture  Past Medical History:  Diagnosis Date   Acute hypoxic respiratory failure (HCC) 10/01/2022   Acute respiratory failure (HCC) 10/01/2022   Anemia    Aortic atherosclerosis    Arthritis    Atrial fibrillation (HCC)    CAD (coronary artery disease)    COVID 2021   mild   ESRD on hemodialysis (HCC)    MWF at [facility]   GERD (gastroesophageal reflux disease)    Gout    Heart murmur    Hyperparathyroidism    Hypertension    in the past, no longer takes medications   Neuromuscular disorder (HCC)    neuropathy   Pneumonia    Seasonal allergies     Past Surgical History:  Procedure Laterality Date   ABDOMINAL AORTOGRAM N/A 08/22/2023   Procedure: ABDOMINAL AORTOGRAM;  Surgeon: Gretta Lonni PARAS, MD;  Location: [facility]IVE [facility];  Service: Cardiovascular;  Laterality: N/A;   ABDOMINAL AORTOGRAM W/LOWER EXTREMITY N/A 12/13/2022   Procedure: ABDOMINAL AORTOGRAM W/LOWER EXTREMITY;  Surgeon: Gretta Lonni PARAS, MD;  Location: [facility];  Service: Cardiovascular;  Laterality: N/A;   CARDIOVERSION N/A 05/16/2021   Procedure: CARDIOVERSION;  Surgeon: Alveta Aleene PARAS, MD;  Location: [facility] ENDOSCOPY;  Service: Cardiovascular;  Laterality: N/A;   CARDIOVERSION N/A 08/01/2021   Procedure: CARDIOVERSION;  Surgeon: Francyne Headland, MD;  Location: [facility] ENDOSCOPY;   Service: Cardiovascular;  Laterality: N/A;   CARDIOVERSION N/A 12/05/2021   Procedure: CARDIOVERSION;  Surgeon: Sheena Pugh, DO;  Location: [facility] ENDOSCOPY;  Service: Cardiovascular;  Laterality: N/A;   DIALYSIS FISTULA CREATION Left    FEMORAL-POPLITEAL BYPASS GRAFT Left 09/25/2023   Procedure: BYPASS GRAFT FEMORAL-POPLITEAL ARTERY;  Surgeon: Magda Debby SAILOR, MD;  Location: [facility] OR;  Service: Vascular;  Laterality: Left;   LOWER EXTREMITY ANGIOGRAPHY N/A 08/22/2023   Procedure: Lower Extremity Angiography;  Surgeon: Gretta Lonni PARAS, MD;  Location: [facility] INVASIVE CV LAB;  Service: Cardiovascular;  Laterality: N/A;   LOWER EXTREMITY INTERVENTION N/A 08/22/2023   Procedure: LOWER EXTREMITY INTERVENTION;  Surgeon: Gretta Lonni PARAS, MD;  Location: [facility];  Service: Cardiovascular;  Laterality: N/A;   PERICARDIOCENTESIS N/A 10/02/2022   Procedure: PERICARDIOCENTESIS;  Surgeon: Verlin Lonni BIRCH, MD;  Location: [facility];  Service: Cardiovascular;  Laterality: N/A;   PERIPHERAL INTRAVASCULAR LITHOTRIPSY  08/22/2023   Procedure: PERIPHERAL INTRAVASCULAR LITHOTRIPSY;  Surgeon: Gretta Lonni PARAS, MD;  Location: [facility];  Service: Cardiovascular;;   REVISON OF ARTERIOVENOUS FISTULA Left 02/16/2022   Procedure: LEFT ARM ARTERIOVENOUS FISTULA REVISION;  Surgeon: Magda Debby SAILOR, MD;  Location: [facility] OR;  Service: Vascular;  Laterality: Left;  PERIPHERAL NERVE BLOCK   TUBAL LIGATION      Family History  Problem Relation Age of Onset   Diabetes Sister    Stomach cancer Neg Hx    Colon cancer Neg  Hx     Social History:  reports that she has quit smoking. Her smoking use included cigarettes. She started smoking about 21 years ago. She has never been exposed to tobacco smoke. She has never used smokeless tobacco. She reports that she does not currently use alcohol after a past usage of about 1.0 standard drink of alcohol per week. She reports that she does not use  drugs.  Allergies:  Allergies  Allergen Reactions   Penicillin G Hives and Itching   Shrimp (Diagnostic) Hives    Medications: I have reviewed the patient's current medications. Scheduled:  sodium chloride  flush  3 mL Intravenous Q12H    Results for orders placed or performed during the hospital encounter of 03/16/24 (from the past 24 hours)  Comprehensive metabolic panel     Status: Abnormal   Collection Time: 03/16/24  7:19 PM  Result Value Ref Range   Sodium 139 135 - 145 mmol/L   Potassium 4.2 3.5 - 5.1 mmol/L   Chloride 95 (L) 98 - 111 mmol/L   CO2 28 22 - 32 mmol/L   Glucose, Bld 101 (H) 70 - 99 mg/dL   BUN 24 (H) 8 - 23 mg/dL   Creatinine, Ser 4.22 (H) 0.44 - 1.00 mg/dL   Calcium  10.2 8.9 - 10.3 mg/dL   Total Protein 7.5 6.5 - 8.1 g/dL   Albumin  3.6 3.5 - 5.0 g/dL   AST 22 15 - 41 U/L   ALT 14 0 - 44 U/L   Alkaline Phosphatase 170 (H) 38 - 126 U/L   Total Bilirubin 0.8 0.0 - 1.2 mg/dL   GFR, Estimated 7 (L) >60 mL/min   Anion gap 16 (H) 5 - 15  CBC     Status: Abnormal   Collection Time: 03/16/24  7:19 PM  Result Value Ref Range   WBC 4.3 4.0 - 10.5 K/uL   RBC 4.30 3.87 - 5.11 MIL/uL   Hemoglobin 12.9 12.0 - 15.0 g/dL   HCT 58.5 63.9 - 53.9 %   MCV 96.3 80.0 - 100.0 fL   MCH 30.0 26.0 - 34.0 pg   MCHC 31.2 30.0 - 36.0 g/dL   RDW 84.6 88.4 - 84.4 %   Platelets 149 (L) 150 - 400 K/uL   nRBC 0.0 0.0 - 0.2 %  Ethanol     Status: None   Collection Time: 03/16/24  7:19 PM  Result Value Ref Range   Alcohol, Ethyl (B) <15 <15 mg/dL  Protime-INR     Status: Abnormal   Collection Time: 03/16/24  7:19 PM  Result Value Ref Range   Prothrombin Time 17.6 (H) 11.4 - 15.2 seconds   INR 1.4 (H) 0.8 - 1.2  Sample to Blood Bank     Status: None   Collection Time: 03/16/24  7:20 PM  Result Value Ref Range   Blood Bank Specimen SAMPLE AVAILABLE FOR TESTING    Sample Expiration      03/19/2024,2359 Performed at [facility], [address] [place], [place]  [address]   I-Stat Chem 8, ED     Status: Abnormal   Collection Time: 03/16/24  7:55 PM  Result Value Ref Range   Sodium 137 135 - 145 mmol/L   Potassium 4.1 3.5 - 5.1 mmol/L   Chloride 99 98 - 111 mmol/L   BUN 27 (H) 8 - 23 mg/dL   Creatinine, Ser 4.19 (H) 0.44 - 1.00 mg/dL   Glucose, Bld 899 (H) 70 - 99 mg/dL  Calcium , Ion 1.09 (L) 1.15 - 1.40 mmol/L   TCO2 31 22 - 32 mmol/L   Hemoglobin 14.3 12.0 - 15.0 g/dL   HCT 57.9 63.9 - 53.9 %  I-Stat Lactic Acid, ED     Status: Abnormal   Collection Time: 03/16/24  7:55 PM  Result Value Ref Range   Lactic Acid, Venous 2.8 (HH) 0.5 - 1.9 mmol/L   Comment NOTIFIED PHYSICIAN      X-ray: CLINICAL DATA:  Trauma   EXAM: PORTABLE PELVIS 1-2 VIEWS   COMPARISON:  None Available.   FINDINGS: Osteopenia.No evidence of pelvic bone diastasis.Subtle lucency through the inferior pubic ramus on the left. Subtle cortical irregularity may also be present along the superior pubic ramus on the left.There is no evidence of arthropathy or other focal bone abnormality. Diffuse aortoiliac atherosclerosis with extensive peripheral vascular atherosclerosis.   IMPRESSION: Osteopenia. Findings worrisome for left inferior and superior pubic rami fractures.     Electronically Signed   By: Rogelia Myers M.D.  ROS: As per HPI  Blood pressure (!) 148/79, pulse 98, temperature (!) 97.4 F (36.3 C), temperature source Oral, resp. rate (!) 22, SpO2 94%.  Physical Exam: Constitutional:      General: She is not in acute distress.    Appearance: Normal appearance. She is well-developed. She is not diaphoretic.     Interventions: Cervical collar and nasal cannula in place.  HENT:     Head: Normocephalic and atraumatic. No raccoon eyes, Battle's sign, abrasion, contusion or laceration.     Right Ear: Hearing, tympanic membrane, ear canal and external ear normal. No laceration, drainage or tenderness. No foreign body. No hemotympanum. Tympanic membrane is  not perforated.     Left Ear: Hearing, tympanic membrane, ear canal and external ear normal. No laceration, drainage or tenderness. No foreign body. No hemotympanum. Tympanic membrane is not perforated.     Nose: Nose normal. No nasal deformity or laceration.     Mouth/Throat:     Mouth: No lacerations.     Pharynx: Uvula midline.  Eyes:     General: Lids are normal. No scleral icterus.    Conjunctiva/sclera: Conjunctivae normal.     Pupils: Pupils are equal, round, and reactive to light.  Neck:     Thyroid : No thyromegaly.     Vascular: No carotid bruit or JVD.     Trachea: Trachea normal.  Cardiovascular:     Rate and Rhythm: Normal rate and regular rhythm.     Pulses: Normal pulses.     Heart sounds: Normal heart sounds.  Pulmonary:     Effort: Pulmonary effort is normal. No respiratory distress.     Breath sounds: Normal breath sounds.  Chest:     Chest wall: No tenderness.  Abdominal:     General: Bowel sounds are decreased. There is no distension.     Palpations: Abdomen is soft. Abdomen is not rigid.     Tenderness: There is no abdominal tenderness. There is no guarding or rebound.  Musculoskeletal:        General: No tenderness. Normal range of motion.     Cervical back: No spinous process tenderness or muscular tenderness.     Comments: 5/5 RLE, 4/5 LLE  Tender to palpation left hip Painful passive and active range of motion left hip Unable to bear independent weight on left due to pain Lymphadenopathy:     Cervical: No cervical adenopathy.  Skin:    General: Skin is warm and dry.  Neurological:     Mental Status: She is alert and oriented to person, place, and time.     GCS: GCS eye subscore is 4. GCS verbal subscore is 5. GCS motor subscore is 6.     Cranial Nerves: No cranial nerve deficit.     Sensory: No sensory deficit.     Motor: No weakness.     Comments: Strength R>L  Psychiatric:        Speech: Speech normal.        Behavior: Behavior normal. Behavior  is cooperative.     Assessment/Plan: Left inferior and superior rami fractures with minimal displacement  Plan: Non operative management PT - WBAT LLE RTC in 4 weeks for X-rays following discharge Appropriate pain control for this 73 yo female  Donnice JONETTA Car 03/16/2024, 9:42 PM

## 2024-03-16 NOTE — Progress Notes (Signed)
   03/16/24 1900  Spiritual Encounters  Type of Visit Initial  Care provided to: Pt not available  Referral source Trauma page  Reason for visit Trauma  OnCall Visit No   Chaplain responded to a level two trauma. No family is present.  If a chaplain is requested someone will respond.   Carley Birmingham [facility]  [phone]

## 2024-03-16 NOTE — H&P (Incomplete)
 History and Physical  Gloria Gallagher FMW:[id number] DOB: Feb 23, 1951 DOA: 03/16/2024  Referring physician: Dr. Billy, Resident-EDP  PCP: Campbell Reynolds, NP  Outpatient Specialists: Nephrology, cardiology, vascular surgery. Patient coming from: Home.  Chief Complaint: Fall.  HPI: Gloria Gallagher is a 73 y.o. female with medical history significant for ESRD on HD MWF, last hemodialysis session was today (3 1/2 hrs), peripheral artery disease, paroxysmal atrial fibrillation on Eliquis , chronic HFpEF, who presents to the ER after a mechanical fall at home.  The patient was attempting to hit a fly, lost her balance and fell.  She was doing fine prior to this fall.  EMS was called.  Level 2 trauma was activated due to the patient being on blood thinners.  No loss of consciousness.  Had pelvic pain.  Left-sided pubic rami fracture and C1-C2 subluxation revealed on CT scan.  Seen by trauma team and orthopedic surgery.  Recommended nonoperative management.  Weightbearing as tolerated.  Return to clinic in 4 weeks for x-ray following discharge.  Pain control.  Discussed the case with neurosurgery, recommended to keep the patient on cervical collar.  They will see her in consultation.  Admitted by [facility], hospitalist service.  ED Course: Temp 97.6.  Blood pressure 164/74, pulse 86, respiratory rate 11, O2 saturation 97% on 2 L nasal cannula.  Review of Systems: Review of systems as noted in the HPI. All other systems reviewed and are negative.   Past Medical History:  Diagnosis Date   Acute hypoxic respiratory failure (HCC) 10/01/2022   Acute respiratory failure (HCC) 10/01/2022   Anemia    Aortic atherosclerosis    Arthritis    Atrial fibrillation (HCC)    CAD (coronary artery disease)    COVID 2021   mild   ESRD on hemodialysis (HCC)    MWF at [facility]   GERD (gastroesophageal reflux disease)    Gout    Heart murmur    Hyperparathyroidism    Hypertension    in the past, no longer takes  medications   Neuromuscular disorder (HCC)    neuropathy   Pneumonia    Seasonal allergies    Past Surgical History:  Procedure Laterality Date   ABDOMINAL AORTOGRAM N/A 08/22/2023   Procedure: ABDOMINAL AORTOGRAM;  Surgeon: Gretta Lonni PARAS, MD;  Location: [facility] INVASIVE CV LAB;  Service: Cardiovascular;  Laterality: N/A;   ABDOMINAL AORTOGRAM W/LOWER EXTREMITY N/A 12/13/2022   Procedure: ABDOMINAL AORTOGRAM W/LOWER EXTREMITY;  Surgeon: Gretta Lonni PARAS, MD;  Location: [facility] INVASIVE CV LAB;  Service: Cardiovascular;  Laterality: N/A;   CARDIOVERSION N/A 05/16/2021   Procedure: CARDIOVERSION;  Surgeon: Alveta Aleene PARAS, MD;  Location: [facility] ENDOSCOPY;  Service: Cardiovascular;  Laterality: N/A;   CARDIOVERSION N/A 08/01/2021   Procedure: CARDIOVERSION;  Surgeon: Francyne Headland, MD;  Location: [facility] ENDOSCOPY;  Service: Cardiovascular;  Laterality: N/A;   CARDIOVERSION N/A 12/05/2021   Procedure: CARDIOVERSION;  Surgeon: Sheena Pugh, DO;  Location: [facility] ENDOSCOPY;  Service: Cardiovascular;  Laterality: N/A;   DIALYSIS FISTULA CREATION Left    FEMORAL-POPLITEAL BYPASS GRAFT Left 09/25/2023   Procedure: BYPASS GRAFT FEMORAL-POPLITEAL ARTERY;  Surgeon: Magda Debby SAILOR, MD;  Location: [facility] OR;  Service: Vascular;  Laterality: Left;   LOWER EXTREMITY ANGIOGRAPHY N/A 08/22/2023   Procedure: Lower Extremity Angiography;  Surgeon: Gretta Lonni PARAS, MD;  Location: [facility] INVASIVE CV LAB;  Service: Cardiovascular;  Laterality: N/A;   LOWER EXTREMITY INTERVENTION N/A 08/22/2023   Procedure: LOWER EXTREMITY INTERVENTION;  Surgeon: Gretta Lonni PARAS, MD;  Location:  [facility] INVASIVE CV LAB;  Service: Cardiovascular;  Laterality: N/A;   PERICARDIOCENTESIS N/A 10/02/2022   Procedure: PERICARDIOCENTESIS;  Surgeon: Verlin Lonni BIRCH, MD;  Location: [facility] INVASIVE CV LAB;  Service: Cardiovascular;  Laterality: N/A;   PERIPHERAL INTRAVASCULAR LITHOTRIPSY  08/22/2023   Procedure: PERIPHERAL INTRAVASCULAR LITHOTRIPSY;   Surgeon: Gretta Lonni PARAS, MD;  Location: [facility] INVASIVE CV LAB;  Service: Cardiovascular;;   REVISON OF ARTERIOVENOUS FISTULA Left 02/16/2022   Procedure: LEFT ARM ARTERIOVENOUS FISTULA REVISION;  Surgeon: Magda Debby SAILOR, MD;  Location: [facility] OR;  Service: Vascular;  Laterality: Left;  PERIPHERAL NERVE BLOCK   TUBAL LIGATION      Social History:  reports that she has quit smoking. Her smoking use included cigarettes. She started smoking about 21 years ago. She has never been exposed to tobacco smoke. She has never used smokeless tobacco. She reports that she does not currently use alcohol after a past usage of about 1.0 standard drink of alcohol per week. She reports that she does not use drugs.   Allergies  Allergen Reactions   Penicillin G Hives and Itching   Shrimp (Diagnostic) Hives    Family History  Problem Relation Age of Onset   Diabetes Sister    Stomach cancer Neg Hx    Colon cancer Neg Hx       Prior to Admission medications   Medication Sig Start Date End Date Taking? Authorizing Provider  acetaminophen -codeine (TYLENOL  #2) 300-15 MG tablet Take 1 tablet by mouth every 4 (four) hours as needed for moderate pain (pain score 4-6).    [provider]  amiodarone  (PACERONE ) 200 MG tablet TAKE 1 TABLET BY MOUTH DAILY 11/14/23   Nahser, Aleene PARAS, MD  apixaban  (ELIQUIS ) 5 MG TABS tablet TAKE 1 TABLET BY MOUTH TWICE  DAILY 11/14/23   Nahser, Aleene PARAS, MD  aspirin  EC 81 MG tablet Take 1 tablet (81 mg total) by mouth daily. Swallow whole. 09/30/23   Baglia, Corrina, PA-C  atorvastatin  (LIPITOR) 20 MG tablet Take 20 mg by mouth every evening. 12/05/21   [provider]  cilostazol  (PLETAL ) 50 MG tablet Take 1 tablet (50 mg total) by mouth 2 (two) times daily. 07/02/23   Gerome Maurilio HERO, PA-C  cinacalcet  (SENSIPAR ) 60 MG tablet Take 60 mg by mouth at bedtime.    [provider]  clopidogrel  (PLAVIX ) 75 MG tablet Take 1 tablet (75 mg total) by mouth daily. Patient  taking differently: Take 75 mg by mouth every evening. 08/22/23 08/21/24  Gretta Lonni PARAS, MD  doxycycline  (VIBRAMYCIN ) 50 MG capsule Take 100 mg by mouth 2 (two) times daily.    [provider]  ferric citrate  (AURYXIA ) 1 GM 210 MG(Fe) tablet Take 1 tablet (210 mg total) by mouth 3 (three) times daily with meals. Patient taking differently: Take 630 mg by mouth 3 (three) times daily with meals. 01/19/22   Bryan Bianchi, MD  HYDROcodone -acetaminophen  (NORCO/VICODIN) 5-325 MG tablet Take 1 tablet by mouth every 6 (six) hours as needed for severe pain (pain score 7-10). 09/30/23   Baglia, Corrina, PA-C  multivitamin (RENA-VIT) TABS tablet Take 1 tablet by mouth at bedtime. 10/05/22   Bryan Bianchi, MD    Physical Exam: BP (!) 148/79   Pulse 98   Temp (!) 97.4 F (36.3 C) (Oral)   Resp (!) 22   SpO2 94%   General: 73 y.o. year-old female well developed well nourished in no acute distress.  Alert and oriented x3.  Cervical collar in place.  Cardiovascular: Regular rate and rhythm with no rubs or gallops.  No thyromegaly or JVD noted.  No lower extremity edema. 2/4 pulses in all 4 extremities. Respiratory: Clear to auscultation with no wheezes or rales. Good inspiratory effort. Abdomen: Soft nontender nondistended with normal bowel sounds x4 quadrants. Muskuloskeletal: No cyanosis, clubbing or edema noted bilaterally Neuro: CN II-XII intact, strength, sensation, reflexes Skin: No ulcerative lesions noted or rashes Psychiatry: Judgement and insight appear normal. Mood is appropriate for condition and setting          Labs on Admission:  Basic Metabolic Panel: Recent Labs  Lab 03/16/24 1919 03/16/24 1955  NA 139 137  K 4.2 4.1  CL 95* 99  CO2 28  --   GLUCOSE 101* 100*  BUN 24* 27*  CREATININE 5.77* 5.80*  CALCIUM  10.2  --    Liver Function Tests: Recent Labs  Lab 03/16/24 1919  AST 22  ALT 14  ALKPHOS 170*  BILITOT 0.8  PROT 7.5  ALBUMIN  3.6   No results for  input(s): LIPASE, AMYLASE in the last 168 hours. No results for input(s): AMMONIA in the last 168 hours. CBC: Recent Labs  Lab 03/16/24 1919 03/16/24 1955  WBC 4.3  --   HGB 12.9 14.3  HCT 41.4 42.0  MCV 96.3  --   PLT 149*  --    Cardiac Enzymes: No results for input(s): CKTOTAL, CKMB, CKMBINDEX, TROPONINI in the last 168 hours.  BNP (last 3 results) No results for input(s): BNP in the last 8760 hours.  ProBNP (last 3 results) No results for input(s): PROBNP in the last 8760 hours.  CBG: No results for input(s): GLUCAP in the last 168 hours.  Radiological Exams on Admission: CT Cervical Spine Wo Contrast Result Date: 03/16/2024 EXAM: CT CERVICAL SPINE WITHOUT CONTRAST 03/16/2024 07:47:29 PM TECHNIQUE: CT of the cervical spine was performed without the administration of intravenous contrast. Multiplanar reformatted images are provided for review. Automated exposure control, iterative reconstruction, and/or weight based adjustment of the mA/kV was utilized to reduce the radiation dose to as low as reasonably achievable. COMPARISON: ct c spine 9.6.23 CLINICAL HISTORY: Neck trauma (Age >= 65y) FINDINGS: CERVICAL SPINE: BONES AND ALIGNMENT: Chronic non-fused healed type 2 dens fracture with interval increased subluxation of the dens anteriorly in relation to the remainder of the C2 vertebral body (6 mm compared to prior 4 mm). Only partial articulation of the C1 lateral masses and C2 articular facets (6:20,36). No acute fracture or traumatic malalignment. DEGENERATIVE CHANGES: Interval stenosis and mass effect on the central canal at the C1-C2 level (6:28). SOFT TISSUES: Atherosclerotic plaque in the carotid arteries within the neck. No prevertebral soft tissue swelling. Recommend further evaluation with MRI cervical spine noncontrast. IMPRESSION: 1. 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 Recommend C-collar placement  and noncontrast MRI of the cervical spine for further evaluation. 2. No acute fracture. 3. These findings were discussed with Dr. Pamella over the phone with Dr. Margarite on 04/12/2024 at 08:09 pm . Electronically signed by: Kate Margarite MD 03/16/2024 08:11 PM EST RP Workstation: [id number]   DG Hip Unilat W or Wo Pelvis 2-3 Views Left Result Date: 03/16/2024 EXAM: 2 or 3 VIEW(S) XRAY OF THE LEFT HIP 03/16/2024 07:55:00 PM COMPARISON: CT abdomen and pelvis 11/20/22 CLINICAL HISTORY: L hip pain s/p fall FINDINGS: BONES AND JOINTS: Left inferior pubic ramus lucency suggestive of underlying fracture. The hip joint is maintained. No significant degenerative changes. Grossly unremarkable sacrum -  limited evaluation due to overlapping osseous structures and overlying soft tissues. SOFT TISSUES: Severe atherosclerotic plaque. Coarsened calcifications overlying the pelvis consistent with known fibroids. IMPRESSION: 1. Left inferior pubic ramus lucency suspicious for fracture. Electronically signed by: Morgane Naveau MD 03/16/2024 08:01 PM EST RP Workstation: [id number]   DG Knee 2 Views Left Result Date: 03/16/2024 EXAM: 1 or 2 VIEW(S) XRAY OF THE LEFT KNEE 03/16/2024 07:55:00 PM COMPARISON: None available. CLINICAL HISTORY: L hip pain s/p fall FINDINGS: BONES AND JOINTS: No acute fracture. No focal osseous lesion. No joint dislocation. No significant joint effusion. No significant degenerative changes. SOFT TISSUES: The soft tissues are unremarkable. VASCULATURE: Severe atherosclerotic plaque. IMPRESSION: 1. No acute fracture or dislocation. 2. Severe atherosclerotic plaque. Electronically signed by: Morgane Naveau MD 03/16/2024 07:59 PM EST RP Workstation: [id number]   CT Head Wo Contrast Result Date: 03/16/2024 EXAM: CT HEAD WITHOUT CONTRAST 03/16/2024 07:47:29 PM TECHNIQUE: CT of the head was performed without the administration of intravenous contrast. Automated exposure control, iterative reconstruction, and/or  weight based adjustment of the mA/kV was utilized to reduce the radiation dose to as low as reasonably achievable. COMPARISON: CT head 01/17/22 CLINICAL HISTORY: Head trauma, minor (Age >= 65y). FINDINGS: BRAIN AND VENTRICLES: Diffuse cerebral and cerebellar atrophy. Patchy and confluent areas of decreased attenuation are noted throughout the deep and periventricular white matter of the cerebral hemispheres bilaterally, suggestive of chronic microvascular ischemic changes. Atherosclerotic calcifications are present within the cavernous internal carotid and vertebral arteries. No acute hemorrhage. No evidence of acute infarct. No hydrocephalus. No extra-axial collection. No mass effect or midline shift. ORBITS: No acute abnormality. SINUSES: Paranasal sinuses are clear. SOFT TISSUES AND SKULL: Partial right mastoid air cell effusion. No fluid within the right middle ear. Partial left mastoid air cell effusion. No fluid within the left middle ear. No acute soft tissue abnormality. No skull fracture. IMPRESSION: 1. No acute intracranial abnormality. 2. Partial right and left mastoid air cell effusions. Electronically signed by: Morgane Naveau MD 03/16/2024 07:58 PM EST RP Workstation: [id number]   DG Pelvis Portable Result Date: 03/16/2024 CLINICAL DATA:  Trauma EXAM: PORTABLE PELVIS 1-2 VIEWS COMPARISON:  None Available. FINDINGS: Osteopenia.No evidence of pelvic bone diastasis.Subtle lucency through the inferior pubic ramus on the left. Subtle cortical irregularity may also be present along the superior pubic ramus on the left.There is no evidence of arthropathy or other focal bone abnormality. Diffuse aortoiliac atherosclerosis with extensive peripheral vascular atherosclerosis. IMPRESSION: Osteopenia. Findings worrisome for left inferior and superior pubic rami fractures. Electronically Signed   By: Rogelia Myers M.D.   On: 03/16/2024 19:32   DG Chest Port 1 View Result Date: 03/16/2024 CLINICAL DATA:  Trauma  , fall EXAM: PORTABLE CHEST - 1 VIEW COMPARISON:  Oct 02, 2022, Oct 01, 2022 FINDINGS: Chronic coarsening of the pulmonary interstitium with streaky opacities in the left mid and lower lung zones, likely emphysema with atelectasis or scarring, unchanged. No new airspace consolidation, pleural effusion, or pneumothorax. Similar elevation of the left hemidiaphragm. Mild cardiomegaly. Interval decrease in size of the pericardial effusion. Tortuous aorta with aortic atherosclerosis. No acute fracture or destructive lesions. Multilevel thoracic osteophytosis. Likely loose body in the left shoulder joint. Diffuse osteopenia. IMPRESSION: No acute cardiopulmonary abnormality. Electronically Signed   By: Rogelia Myers M.D.   On: 03/16/2024 19:28    EKG: I independently viewed the EKG done and my findings are as followed: None available at the time of this visit.  Assessment/Plan Present on Admission:  Fall  Principal  Problem:   Fall  Fall, mechanical, POA Left-sided pubic rami fracture and C1-C2 subluxation revealed on CT scan.  Seen by trauma team and orthopedic surgery.  Recommended nonoperative management.  Weightbearing as tolerated.  Return to clinic in 4 weeks for x-ray following discharge.  Pain control.  Discussed the case with neurosurgery, recommended to keep the patient on cervical collar.  They will see her in consultation.  Continue pain control and fall precautions. Continue to keep the patient on cervical collar as recommended by neurosurgery. PT OT evaluation with the guidance of orthopedic surgery and neurosurgery.  C1-C2 subluxation, POA Remote ununited displaced type II dens fracture with mild posterior subluxation of C2 relative to the lateral masses of C1, indeterminate acuity given Motion limited study without definitive edema or fluid collection to suggest acute ligamentous injury  Left-sided pubic rami fracture, POA Weightbearing as tolerated PT OT evaluation under the  guidance of orthopedic surgery. Will need to follow-up outpatient 4 weeks after discharge for repeat imaging.  Paroxysmal A-fib on Eliquis  Eliquis  on hold for now Resume home amiodarone  for rate control Continue to monitor on telemetry  PVD Resume antiplatelets when okay with orthopedic surgery and neurosurgery. Resume home statin  ESRD on HD MWF Last hemodialysis was on Monday, 03/16/2024.   Time: 75 minutes.   DVT prophylaxis: SCDs.  Resume home Eliquis  when okay with surgery.  Code Status: Full code.  Family Communication: None at bedside.  Disposition Plan: Admitted to telemetry unit.  Consults called: Trauma team, orthopedic surgery, neurosurgery.  Admission status: Inpatient status.   Status is: Inpatient The patient requires at least 2 midnights for further evaluation and treatment of present condition.   Terry LOISE Hurst MD [facility] Pager [phone]  If 7PM-7AM, please contact night-coverage [email or web address] Password [facility]  03/16/2024, 9:48 PM

## 2024-03-16 NOTE — Consult Note (Signed)
 Reason for Consult:fall with MMP Referring Physician: Dr. [name] is an 73 y.o. female.  HPI: 42F s/p fall this AM.  Pt states she she fell to her left side.  States that she tripped and then fell. She denies any LOC.  Patient comes in with pelvic pain.  She has does have a past medical history significant for A-fib-on Eliquis ., CAD, incisional disease on dialysis, hypertension  Patient underwent CT scan which was significant for C1-C2 subluxation.  Neurosurgery was consulted and requested MRI.  Patient with left-sided pubic rami fracture.  CT of abdomen pelvis did not performed.  Past Medical History:  Diagnosis Date   Acute hypoxic respiratory failure (HCC) 10/01/2022   Acute respiratory failure (HCC) 10/01/2022   Anemia    Aortic atherosclerosis    Arthritis    Atrial fibrillation (HCC)    CAD (coronary artery disease)    COVID 2021   mild   ESRD on hemodialysis (HCC)    MWF at [facility]   GERD (gastroesophageal reflux disease)    Gout    Heart murmur    Hyperparathyroidism    Hypertension    in the past, no longer takes medications   Neuromuscular disorder (HCC)    neuropathy   Pneumonia    Seasonal allergies     Past Surgical History:  Procedure Laterality Date   ABDOMINAL AORTOGRAM N/A 08/22/2023   Procedure: ABDOMINAL AORTOGRAM;  Surgeon: Gretta Lonni PARAS, MD;  [facility];  Service: Cardiovascular;  Laterality: N/A;   ABDOMINAL AORTOGRAM W/LOWER EXTREMITY N/A 12/13/2022   Procedure: ABDOMINAL AORTOGRAM W/LOWER EXTREMITY;  Surgeon: Gretta Lonni PARAS, MD;  [facility];  Service: Cardiovascular;  Laterality: N/A;   CARDIOVERSION N/A 05/16/2021   Procedure: CARDIOVERSION;  Surgeon: Alveta Aleene PARAS, MD;  Location: [facility] ENDOSCOPY;  Service: Cardiovascular;  Laterality: N/A;   CARDIOVERSION N/A 08/01/2021   Procedure: CARDIOVERSION;  Surgeon: Francyne Headland, MD;  [facility]: [facility] ENDOSCOPY;  Service: Cardiovascular;   Laterality: N/A;   CARDIOVERSION N/A 12/05/2021   Procedure: CARDIOVERSION;  Surgeon: Sheena Pugh, DO;  [facility]: [facility] ENDOS[facility];  Service: Cardiovascular;  Laterality: N/A;   DIALYSIS FISTULA CREATION Left    FEMORAL-POPLITEAL BYPASS GRAFT Left 09/25/2023   Procedure: BYPASS GRAFT FEMORAL-POPLITEAL ARTERY;  Surgeon: Magda Debby SAILOR, MD;  Location: [facility] OR;  Service: Vascular;  Laterality: Left;   LOWER EXTREMITY ANGIOGRAPHY N/A 08/22/2023   Procedure: Lower Extremity Angiography;  Surgeon: Gretta Lonni PARAS, MD;  Location: [facility] INVASIVE CV LAB;  Service: Cardiovascular;  Laterality: N/A;   LOWER EXTREMITY INTERVENTION N/A 08/22/2023   Procedure: LOWER EXTREMITY INTERVENTION;  Surgeon: Gretta Lonni PARAS, MD;  [facility];  Service: Cardiovascular;  Laterality: N/A;   PERICARDIOCENTESIS N/A 10/02/2022   Procedure: PERICARDIOCENTESIS;  Surgeon: Verlin Lonni BIRCH, MD;  [facility];  Service: Cardiovascular;  Laterality: N/A;   PERIPHERAL INTRAVASCULAR LITHOTRIPSY  08/22/2023   Procedure: PERIPHERAL INTRAVASCULAR LITHOTRIPSY;  Surgeon: Gretta Lonni PARAS, MD;  [facility];  Service: Cardiovascular;;   REVISON OF ARTERIOVENOUS FISTULA Left 02/16/2022   Procedure: LEFT ARM ARTERIOVENOUS FISTULA REVISION;  Surgeon: Magda Debby SAILOR, MD;  [facility] OR;  Service: Vascular;  Laterality: Left;  PERIPHERAL NERVE BLOCK   TUBAL LIGATION      Family History  Problem Relation Age of Onset   Diabetes Sister    Stomach cancer Neg Hx    Colon cancer Neg Hx  Social History:  reports that she has quit smoking. Her smoking use included cigarettes. She started smoking about 21 years ago. She has never been exposed to tobacco smoke. She has never used smokeless tobacco. She reports that she does not currently use alcohol after a past usage of about 1.0 standard drink of alcohol per week. She reports that she does not use drugs.  Allergies:  Allergies   Allergen Reactions   Penicillin G Hives and Itching   Shrimp (Diagnostic) Hives    Medications: I have reviewed the patient's current medications.  Results for orders placed or performed during the hospital encounter of 03/16/24 (from the past 48 hours)  Comprehensive metabolic panel     Status: Abnormal   Collection Time: 03/16/24  7:19 PM  Result Value Ref Range   Sodium 139 135 - 145 mmol/L   Potassium 4.2 3.5 - 5.1 mmol/L   Chloride 95 (L) 98 - 111 mmol/L   CO2 28 22 - 32 mmol/L   Glucose, Bld 101 (H) 70 - 99 mg/dL    Comment: Glucose reference range applies only to samples taken after fasting for at least 8 hours.   BUN 24 (H) 8 - 23 mg/dL   Creatinine, Ser 4.22 (H) 0.44 - 1.00 mg/dL   Calcium  10.2 8.9 - 10.3 mg/dL   Total Protein 7.5 6.5 - 8.1 g/dL   Albumin  3.6 3.5 - 5.0 g/dL   AST 22 15 - 41 U/L   ALT 14 0 - 44 U/L   Alkaline Phosphatase 170 (H) 38 - 126 U/L   Total Bilirubin 0.8 0.0 - 1.2 mg/dL   GFR, Estimated 7 (L) >60 mL/min    Comment: (NOTE) Calculated using the CKD-EPI Creatinine Equation (2021)    Anion gap 16 (H) 5 - 15    Comment: Performed at [facility], [address], [place], [place] [address]  CBC     Status: Abnormal   Collection Time: 03/16/24  7:19 PM  Result Value Ref Range   WBC 4.3 4.0 - 10.5 K/uL   RBC 4.30 3.87 - 5.11 MIL/uL   Hemoglobin 12.9 12.0 - 15.0 g/dL   HCT 58.5 63.9 - 53.9 %   MCV 96.3 80.0 - 100.0 fL   MCH 30.0 26.0 - 34.0 pg   MCHC 31.2 30.0 - 36.0 g/dL   RDW 84.6 88.4 - 84.4 %   Platelets 149 (L) 150 - 400 K/uL   nRBC 0.0 0.0 - 0.2 %    Comment: Performed at [facility], [address] [place], [place] [address]  Ethanol     Status: None   Collection Time: 03/16/24  7:19 PM  Result Value Ref Range   Alcohol, Ethyl (B) <15 <15 mg/dL    Comment: (NOTE) For medical purposes only. Performed at [facility], [address] [place], [place] [address]   Protime-INR     Status: Abnormal   Collection  Time: 03/16/24  7:19 PM  Result Value Ref Range   Prothrombin Time 17.6 (H) 11.4 - 15.2 seconds   INR 1.4 (H) 0.8 - 1.2    Comment: (NOTE) INR goal varies based on device and disease states. Performed at [facility], [address], [place], [place] [address]   Sample to Blood Bank     Status: None   Collection Time: 03/16/24  7:20 PM  Result Value Ref Range   Blood Bank Specimen SAMPLE AVAILABLE FOR TESTING  Sample Expiration      03/19/2024,2359 Performed at [facility], [address] [place], [place] [address]   I-Stat Chem 8, ED     Status: Abnormal   Collection Time: 03/16/24  7:55 PM  Result Value Ref Range   Sodium 137 135 - 145 mmol/L   Potassium 4.1 3.5 - 5.1 mmol/L   Chloride 99 98 - 111 mmol/L   BUN 27 (H) 8 - 23 mg/dL   Creatinine, Ser 4.19 (H) 0.44 - 1.00 mg/dL   Glucose, Bld 899 (H) 70 - 99 mg/dL    Comment: Glucose reference range applies only to samples taken after fasting for at least 8 hours.   Calcium , Ion 1.09 (L) 1.15 - 1.40 mmol/L   TCO2 31 22 - 32 mmol/L   Hemoglobin 14.3 12.0 - 15.0 g/dL   HCT 57.9 63.9 - 53.9 %  I-Stat Lactic Acid, ED     Status: Abnormal   Collection Time: 03/16/24  7:55 PM  Result Value Ref Range   Lactic Acid, Venous 2.8 (HH) 0.5 - 1.9 mmol/L   Comment NOTIFIED PHYSICIAN     CT Cervical Spine Wo Contrast Result Date: 03/16/2024 EXAM: CT CERVICAL SPINE WITHOUT CONTRAST 03/16/2024 07:47:29 PM TECHNIQUE: CT of the cervical spine was performed without the administration of intravenous contrast. Multiplanar reformatted images are provided for review. Automated exposure control, iterative reconstruction, and/or weight based adjustment of the mA/kV was utilized to reduce the radiation dose to as low as reasonably achievable. COMPARISON: ct c spine 9.6.23 CLINICAL HISTORY: Neck trauma (Age >= 65y) FINDINGS: CERVICAL SPINE: BONES AND ALIGNMENT: Chronic non-fused healed type 2 dens fracture with interval increased subluxation  of the dens anteriorly in relation to the remainder of the C2 vertebral body (6 mm compared to prior 4 mm). Only partial articulation of the C1 lateral masses and C2 articular facets (6:20,36). No acute fracture or traumatic malalignment. DEGENERATIVE CHANGES: Interval stenosis and mass effect on the central canal at the C1-C2 level (6:28). SOFT TISSUES: Atherosclerotic plaque in the carotid arteries within the neck. No prevertebral soft tissue swelling. Recommend further evaluation with MRI cervical spine noncontrast. IMPRESSION: 1. 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 Recommend C-collar placement and noncontrast MRI of the cervical spine for further evaluation. 2. No acute fracture. 3. These findings were discussed with Dr. Pamella over the phone with Dr. Margarite on 04/12/2024 at 08:09 pm . Electronically signed by: Kate Margarite MD 03/16/2024 08:11 PM EST RP Workstation: [id number]   DG Hip Unilat W or Wo Pelvis 2-3 Views Left Result Date: 03/16/2024 EXAM: 2 or 3 VIEW(S) XRAY OF THE LEFT HIP 03/16/2024 07:55:00 PM COMPARISON: CT abdomen and pelvis 11/20/22 CLINICAL HISTORY: L hip pain s/p fall FINDINGS: BONES AND JOINTS: Left inferior pubic ramus lucency suggestive of underlying fracture. The hip joint is maintained. No significant degenerative changes. Grossly unremarkable sacrum -limited evaluation due to overlapping osseous structures and overlying soft tissues. SOFT TISSUES: Severe atherosclerotic plaque. Coarsened calcifications overlying the pelvis consistent with known fibroids. IMPRESSION: 1. Left inferior pubic ramus lucency suspicious for fracture. Electronically signed by: Morgane Naveau MD 03/16/2024 08:01 PM EST RP Workstation: [id number]   DG Knee 2 Views Left Result Date: 03/16/2024 EXAM: 1 or 2 VIEW(S) XRAY OF THE LEFT KNEE 03/16/2024 07:55:00 PM COMPARISON: None available. CLINICAL HISTORY: L hip pain s/p fall FINDINGS: BONES AND JOINTS:  No acute fracture. No focal osseous lesion. No joint dislocation. No significant joint effusion.  No significant degenerative changes. SOFT TISSUES: The soft tissues are unremarkable. VASCULATURE: Severe atherosclerotic plaque. IMPRESSION: 1. No acute fracture or dislocation. 2. Severe atherosclerotic plaque. Electronically signed by: Morgane Naveau MD 03/16/2024 07:59 PM EST RP Workstation: [id number]   CT Head Wo Contrast Result Date: 03/16/2024 EXAM: CT HEAD WITHOUT CONTRAST 03/16/2024 07:47:29 PM TECHNIQUE: CT of the head was performed without the administration of intravenous contrast. Automated exposure control, iterative reconstruction, and/or weight based adjustment of the mA/kV was utilized to reduce the radiation dose to as low as reasonably achievable. COMPARISON: CT head 01/17/22 CLINICAL HISTORY: Head trauma, minor (Age >= 65y). FINDINGS: BRAIN AND VENTRICLES: Diffuse cerebral and cerebellar atrophy. Patchy and confluent areas of decreased attenuation are noted throughout the deep and periventricular white matter of the cerebral hemispheres bilaterally, suggestive of chronic microvascular ischemic changes. Atherosclerotic calcifications are present within the cavernous internal carotid and vertebral arteries. No acute hemorrhage. No evidence of acute infarct. No hydrocephalus. No extra-axial collection. No mass effect or midline shift. ORBITS: No acute abnormality. SINUSES: Paranasal sinuses are clear. SOFT TISSUES AND SKULL: Partial right mastoid air cell effusion. No fluid within the right middle ear. Partial left mastoid air cell effusion. No fluid within the left middle ear. No acute soft tissue abnormality. No skull fracture. IMPRESSION: 1. No acute intracranial abnormality. 2. Partial right and left mastoid air cell effusions. Electronically signed by: Morgane Naveau MD 03/16/2024 07:58 PM EST RP Workstation: [id number]   DG Pelvis Portable Result Date: 03/16/2024 CLINICAL DATA:  Trauma EXAM:  PORTABLE PELVIS 1-2 VIEWS COMPARISON:  None Available. FINDINGS: Osteopenia.No evidence of pelvic bone diastasis.Subtle lucency through the inferior pubic ramus on the left. Subtle cortical irregularity may also be present along the superior pubic ramus on the left.There is no evidence of arthropathy or other focal bone abnormality. Diffuse aortoiliac atherosclerosis with extensive peripheral vascular atherosclerosis. IMPRESSION: Osteopenia. Findings worrisome for left inferior and superior pubic rami fractures. Electronically Signed   By: Rogelia Myers M.D.   On: 03/16/2024 19:32   DG Chest Port 1 View Result Date: 03/16/2024 CLINICAL DATA:  Trauma , fall EXAM: PORTABLE CHEST - 1 VIEW COMPARISON:  Oct 02, 2022, Oct 01, 2022 FINDINGS: Chronic coarsening of the pulmonary interstitium with streaky opacities in the left mid and lower lung zones, likely emphysema with atelectasis or scarring, unchanged. No new airspace consolidation, pleural effusion, or pneumothorax. Similar elevation of the left hemidiaphragm. Mild cardiomegaly. Interval decrease in size of the pericardial effusion. Tortuous aorta with aortic atherosclerosis. No acute fracture or destructive lesions. Multilevel thoracic osteophytosis. Likely loose body in the left shoulder joint. Diffuse osteopenia. IMPRESSION: No acute cardiopulmonary abnormality. Electronically Signed   By: Rogelia Myers M.D.   On: 03/16/2024 19:28    Review of Systems  Constitutional: Negative.   HENT: Negative.    Respiratory: Negative.    Cardiovascular: Negative.   Gastrointestinal: Negative.   Neurological: Negative.   All other systems reviewed and are negative.  Blood pressure (!) 148/79, pulse 98, temperature (!) 97.4 F (36.3 C), temperature source Oral, resp. rate (!) 22, SpO2 94%. Physical Exam Vitals reviewed.  Constitutional:      General: She is not in acute distress.    Appearance: Normal appearance. She is well-developed. She is not  diaphoretic.     Interventions: Cervical collar and nasal cannula in place.  HENT:     Head: Normocephalic and atraumatic. No raccoon eyes, Battle's sign, abrasion, contusion or laceration.     Right Ear: Hearing, tympanic  membrane, ear canal and external ear normal. No laceration, drainage or tenderness. No foreign body. No hemotympanum. Tympanic membrane is not perforated.     Left Ear: Hearing, tympanic membrane, ear canal and external ear normal. No laceration, drainage or tenderness. No foreign body. No hemotympanum. Tympanic membrane is not perforated.     Nose: Nose normal. No nasal deformity or laceration.     Mouth/Throat:     Mouth: No lacerations.     Pharynx: Uvula midline.  Eyes:     General: Lids are normal. No scleral icterus.    Conjunctiva/sclera: Conjunctivae normal.     Pupils: Pupils are equal, round, and reactive to light.  Neck:     Thyroid : No thyromegaly.     Vascular: No carotid bruit or JVD.     Trachea: Trachea normal.  Cardiovascular:     Rate and Rhythm: Normal rate and regular rhythm.     Pulses: Normal pulses.     Heart sounds: Normal heart sounds.  Pulmonary:     Effort: Pulmonary effort is normal. No respiratory distress.     Breath sounds: Normal breath sounds.  Chest:     Chest wall: No tenderness.  Abdominal:     General: Bowel sounds are decreased. There is no distension.     Palpations: Abdomen is soft. Abdomen is not rigid.     Tenderness: There is no abdominal tenderness. There is no guarding or rebound.  Musculoskeletal:        General: No tenderness. Normal range of motion.     Cervical back: No spinous process tenderness or muscular tenderness.     Comments: 5/5 RLE, 4/5 LLE  Lymphadenopathy:     Cervical: No cervical adenopathy.  Skin:    General: Skin is warm and dry.  Neurological:     Mental Status: She is alert and oriented to person, place, and time.     GCS: GCS eye subscore is 4. GCS verbal subscore is 5. GCS motor subscore  is 6.     Cranial Nerves: No cranial nerve deficit.     Sensory: No sensory deficit.     Motor: No weakness.     Comments: Strength R>L  Psychiatric:        Speech: Speech normal.        Behavior: Behavior normal. Behavior is cooperative.     Assessment/Plan: 73 year old female  status post fall. C1-C2 subluxation Left pubic rami fracture  1.  Would recommend medical admission secondary to multiple medical issues 2.  Neurosurgery has been consulted for evaluation.  MRI currently pending. 3.  Orthopedics has been consulted for pubic rami fracture.  Likely nonoperative.  Will need PT.  Lynda Leos 03/16/2024, 9:38 PM

## 2024-03-16 NOTE — ED Triage Notes (Signed)
 Pt bib [facility] coming from home w CC of fall on thinners. Pt on eliquis . Pt did hit head but no LOC. GCS 15.

## 2024-03-16 NOTE — ED Provider Notes (Signed)
 [place] EMERGENCY DEPARTMENT AT [facility] Provider Note  MDM   HPI/ROS:  Gloria Gallagher is a 73 y.o. female with a PMHx of ESRD on HD MWF, last hemodialysis session was today (3 1/2 hrs), peripheral artery disease, paroxysmal atrial fibrillation on Eliquis , chronic HFpEF, who presents to the ER as level 2 trauma after mechanical fall at home.  History obtained from EMS as well as patient. Briefly, the patient was attempting to swat a fly, lost her balance and fell. She endorses pain in her left groin/hip. She arrived ABCs intact, HDS. Patient placed in C-collar.  Head to toe primary assessment was performed and findings, detailed below, were notable for pelvis was stable but tende3r on the left.  Laboratory studies were obtained and imaging, including trauma scans and plain films were ordered, as detailed with signifiant findings including: - L pubic rami fractures - C1-C2 subluxation on CT scan.   Given the patient's clinical picture, Trauma Surgery was consulted. Additional consults were placed to orthopedic surgery and Neurosurgery.  NSGY recommending contuing HCAT and will follow-up cervical MR. Orthopedic surgery recommending WBAT until further evaluation. Patient to be admitted to Hospitalist given multiple comorbidities with services as above following.  Interpretations, interventions, and the patient's course of care are documented below.    Clinical Course as of 03/18/24 0856  Mon Mar 16, 2024  05-06-1951 Findings worrisome for left inferior and superior pubic rami fractures   [SA]  2009 Call taken from radiology.  CT cervical spine concerning for potential acute AA subluxation.  Will order MRI cervical.  Aspen cervical collar ordered [MP]    Clinical Course User Index [MP] Pamella Ozell LABOR, DO [SA] Billy Pal, MD     Disposition: Admit  Clinical Impression: No diagnosis found.  Rx / DC Orders ED Discharge Orders     None       Clinical Complexity A  medically appropriate history, review of systems, and physical exam was performed.  My independent interpretations of EKG, labs, and radiology are documented in the ED course above.   If decision rules were used in this patient's evaluation, they are listed below.   Click here for ABCD2, HEART and other calculatorsREFRESH Note before signing   Patient's presentation is most consistent with acute complicated illness / injury requiring diagnostic workup.  Medical Decision Making Amount and/or Complexity of Data Reviewed Labs: ordered. Radiology: ordered.  Risk Prescription drug management. Decision regarding hospitalization.    HPI/ROS      See MDM section for pertinent HPI and ROS. A complete ROS was performed with pertinent positives/negatives noted above.   Past Medical History:  Diagnosis Date   Acute hypoxic respiratory failure (HCC) 10/01/2022   Acute respiratory failure (HCC) 10/01/2022   Anemia    Aortic atherosclerosis    Arthritis    Atrial fibrillation (HCC)    CAD (coronary artery disease)    COVID 2021   mild   ESRD on hemodialysis (HCC)    MWF at [facility]   GERD (gastroesophageal reflux disease)    Gout    Heart murmur    Hyperparathyroidism    Hypertension    in the past, no longer takes medications   Neuromuscular disorder (HCC)    neuropathy   Pneumonia    Seasonal allergies     Past Surgical History:  Procedure Laterality Date   ABDOMINAL AORTOGRAM N/A 08/22/2023   Procedure: ABDOMINAL AORTOGRAM;  Surgeon: Gretta Lonni PARAS, MD;  [facility];  Service: Cardiovascular;  Laterality: N/A;   ABDOMINAL AORTOGRAM W/LOWER EXTREMITY N/A 12/13/2022   Procedure: ABDOMINAL AORTOGRAM W/LOWER EXTREMITY;  Surgeon: Gretta Lonni PARAS, MD;  [facility]: [facility];  Service: Cardiovascular;  Laterality: N/A;   CARDIOVERSION N/A 05/16/2021   Procedure: CARDIOVERSION;  Surgeon: Alveta Aleene PARAS, MD;  Location: [facility] ENDOSCOPY;  Service:  Cardiovascular;  Laterality: N/A;   CARDIOVERSION N/A 08/01/2021   Procedure: CARDIOVERSION;  Surgeon: Francyne Headland, MD;  Location: [facility] ENDOSCOPY;  Service: Cardiovascular;  Laterality: N/A;   CARDIOVERSION N/A 12/05/2021   Procedure: CARDIOVERSION;  Surgeon: Sheena Pugh, DO;  Location: [facility] ENDOSCOPY;  Service: Cardiovascular;  Laterality: N/A;   DIALYSIS FISTULA CREATION Left    FEMORAL-POPLITEAL BYPASS GRAFT Left 09/25/2023   Procedure: BYPASS GRAFT FEMORAL-POPLITEAL ARTERY;  Surgeon: Magda Debby SAILOR, MD;  Location: [facility] OR;  Service: Vascular;  Laterality: Left;   LOWER EXTREMITY ANGIOGRAPHY N/A 08/22/2023   Procedure: Lower Extremity Angiography;  Surgeon: Gretta Lonni PARAS, MD;  Location: [facility] INVASIVE CV LAB;  Service: Cardiovascular;  Laterality: N/A;   LOWER EXTREMITY INTERVENTION N/A 08/22/2023   Procedure: LOWER EXTREMITY INTERVENTION;  Surgeon: Gretta Lonni PARAS, MD;  [facility];  Service: Cardiovascular;  Laterality: N/A;   PERICARDIOCENTESIS N/A 10/02/2022   Procedure: PERICARDIOCENTESIS;  Surgeon: Verlin Lonni BIRCH, MD;  [facility];  Service: Cardiovascular;  Laterality: N/A;   PERIPHERAL INTRAVASCULAR LITHOTRIPSY  08/22/2023   Procedure: PERIPHERAL INTRAVASCULAR LITHOTRIPSY;  Surgeon: Gretta Lonni PARAS, MD;  [facility];  Service: Cardiovascular;;   REVISON OF ARTERIOVENOUS FISTULA Left 02/16/2022   Procedure: LEFT ARM ARTERIOVENOUS FISTULA REVISION;  Surgeon: Magda Debby SAILOR, MD;  Location: [facility] OR;  Service: Vascular;  Laterality: Left;  PERIPHERAL NERVE BLOCK   TUBAL LIGATION        Physical Exam   Vitals:   03/18/24 0601 03/18/24 0755 03/18/24 0817 03/18/24 0830  BP: 134/74 (!) 141/77 (!) 144/67 (!) 149/74  Pulse: 91 84 91 83  Resp: 17 (!) 24 15 (!) 24  Temp: 97.9 F (36.6 C) (!) 97.5 F (36.4 C)    TempSrc: Oral     SpO2: 100%  99%   Weight: 68 kg       Physical Exam Vitals and nursing note reviewed.   Constitutional:      General: She is not in acute distress.    Appearance: She is well-developed.  HENT:     Head: Normocephalic and atraumatic.  Eyes:     Conjunctiva/sclera: Conjunctivae normal.  Neck:     Comments: No midline C-spine tenderness Cardiovascular:     Rate and Rhythm: Normal rate and regular rhythm.     Heart sounds: No murmur heard. Pulmonary:     Effort: Pulmonary effort is normal. No respiratory distress.     Breath sounds: Normal breath sounds.  Abdominal:     Palpations: Abdomen is soft.     Tenderness: There is no abdominal tenderness.  Musculoskeletal:        General: No swelling.     Cervical back: Neck supple.     Comments: Left pelvic TTP, stable pelvis Left arm fistula with palpable thrill  Skin:    General: Skin is warm and dry.     Capillary Refill: Capillary refill takes less than 2 seconds.  Neurological:     General: No focal deficit present.     Mental Status: She is alert and oriented to person, place, and time. Mental status is at  baseline.     Motor: No weakness.     Procedures   If procedures were preformed on this patient, they are listed below:  Procedures   Please note that this documentation was produced with the assistance of voice-to-text technology and may contain errors.     Billy Pal, MD 03/18/24 0902    Pamella Ozell LABOR, DO 03/24/24 [id number]

## 2024-03-17 DIAGNOSIS — W19XXXA Unspecified fall, initial encounter: Secondary | ICD-10-CM | POA: Diagnosis not present

## 2024-03-17 DIAGNOSIS — N186 End stage renal disease: Secondary | ICD-10-CM | POA: Diagnosis not present

## 2024-03-17 LAB — CBC
HCT: 35.5 % — ABNORMAL LOW (ref 36.0–46.0)
Hemoglobin: 11.3 g/dL — ABNORMAL LOW (ref 12.0–15.0)
MCH: 30.5 pg (ref 26.0–34.0)
MCHC: 31.8 g/dL (ref 30.0–36.0)
MCV: 95.9 fL (ref 80.0–100.0)
Platelets: 122 K/uL — ABNORMAL LOW (ref 150–400)
RBC: 3.7 MIL/uL — ABNORMAL LOW (ref 3.87–5.11)
RDW: 15.3 % (ref 11.5–15.5)
WBC: 4.7 K/uL (ref 4.0–10.5)
nRBC: 0 % (ref 0.0–0.2)

## 2024-03-17 LAB — HEPATITIS B SURFACE ANTIGEN: Hepatitis B Surface Ag: NONREACTIVE

## 2024-03-17 LAB — RENAL FUNCTION PANEL
Albumin: 3.4 g/dL — ABNORMAL LOW (ref 3.5–5.0)
Anion gap: 13 (ref 5–15)
BUN: 28 mg/dL — ABNORMAL HIGH (ref 8–23)
CO2: 28 mmol/L (ref 22–32)
Calcium: 9.4 mg/dL (ref 8.9–10.3)
Chloride: 95 mmol/L — ABNORMAL LOW (ref 98–111)
Creatinine, Ser: 6.13 mg/dL — ABNORMAL HIGH (ref 0.44–1.00)
GFR, Estimated: 7 mL/min — ABNORMAL LOW (ref >60)
Glucose, Bld: 103 mg/dL — ABNORMAL HIGH (ref 70–99)
Phosphorus: 4.6 mg/dL (ref 2.5–4.6)
Potassium: 4 mmol/L (ref 3.5–5.1)
Sodium: 136 mmol/L (ref 135–145)

## 2024-03-17 MED ORDER — RENA-VITE PO TABS
1.0000 | ORAL_TABLET | Freq: Every day | ORAL | Status: DC
  Administered 2024-03-17 – 2024-03-26 (×10): 1 via ORAL
  Filled 2024-03-17 (×10): qty 1

## 2024-03-17 MED ORDER — DOXERCALCIFEROL 4 MCG/2ML IV SOLN
4.0000 ug | INTRAVENOUS | Status: DC
  Administered 2024-03-20 – 2024-03-27 (×4): 4 ug via INTRAVENOUS
  Filled 2024-03-17 (×5): qty 2

## 2024-03-17 MED ORDER — ACETAMINOPHEN 500 MG PO TABS
1000.0000 mg | ORAL_TABLET | Freq: Three times a day (TID) | ORAL | Status: AC
  Administered 2024-03-17 – 2024-03-18 (×3): 1000 mg via ORAL
  Filled 2024-03-17 (×4): qty 2

## 2024-03-17 MED ORDER — AMIODARONE HCL 200 MG PO TABS
200.0000 mg | ORAL_TABLET | Freq: Every day | ORAL | Status: DC
  Administered 2024-03-17 – 2024-03-28 (×11): 200 mg via ORAL
  Filled 2024-03-17 (×11): qty 1

## 2024-03-17 MED ORDER — CINACALCET HCL 30 MG PO TABS
90.0000 mg | ORAL_TABLET | Freq: Every evening | ORAL | Status: DC
  Administered 2024-03-17 – 2024-03-27 (×9): 90 mg via ORAL
  Filled 2024-03-17 (×12): qty 3

## 2024-03-17 MED ORDER — METHOCARBAMOL 500 MG PO TABS
1000.0000 mg | ORAL_TABLET | Freq: Three times a day (TID) | ORAL | Status: DC
  Administered 2024-03-17 – 2024-03-28 (×30): 1000 mg via ORAL
  Filled 2024-03-17 (×32): qty 2

## 2024-03-17 MED ORDER — HYDROMORPHONE HCL 1 MG/ML IJ SOLN
0.5000 mg | INTRAMUSCULAR | Status: DC | PRN
  Administered 2024-03-17 (×2): 0.5 mg via INTRAVENOUS
  Filled 2024-03-17 (×2): qty 1

## 2024-03-17 MED ORDER — PROSOURCE PLUS PO LIQD
30.0000 mL | Freq: Two times a day (BID) | ORAL | Status: DC
  Administered 2024-03-18: 30 mL via ORAL
  Filled 2024-03-17 (×8): qty 30

## 2024-03-17 MED ORDER — ATORVASTATIN CALCIUM 10 MG PO TABS
20.0000 mg | ORAL_TABLET | Freq: Every evening | ORAL | Status: DC
  Administered 2024-03-17 – 2024-03-27 (×9): 20 mg via ORAL
  Filled 2024-03-17 (×10): qty 2

## 2024-03-17 MED ORDER — APIXABAN 5 MG PO TABS
5.0000 mg | ORAL_TABLET | Freq: Two times a day (BID) | ORAL | Status: DC
  Administered 2024-03-17 – 2024-03-19 (×6): 5 mg via ORAL
  Filled 2024-03-17 (×6): qty 1

## 2024-03-17 MED ORDER — FERRIC CITRATE 1 GM 210 MG(FE) PO TABS
420.0000 mg | ORAL_TABLET | Freq: Three times a day (TID) | ORAL | Status: DC
  Administered 2024-03-17 – 2024-03-28 (×22): 420 mg via ORAL
  Filled 2024-03-17 (×35): qty 2

## 2024-03-17 MED ORDER — CHLORHEXIDINE GLUCONATE CLOTH 2 % EX PADS
6.0000 | MEDICATED_PAD | Freq: Every day | CUTANEOUS | Status: DC
  Administered 2024-03-18 – 2024-03-22 (×5): 6 via TOPICAL

## 2024-03-17 NOTE — Progress Notes (Signed)
 Progress Note     Subjective: Patient reports that pain is manageable at this time. Has not had BM or flatulence since arriving. No longer produces urine. Had some nausea after receiving pain medication. Denies vomiting. Denies nausea currently. Denies CP, SOB.   ROS  All negative with the exception of above.  Objective: Vital signs in last 24 hours: Temp:  [97.4 F (36.3 C)-97.6 F (36.4 C)] 97.6 F (36.4 C) (11/04 0701) Pulse Rate:  [78-98] 81 (11/04 0630) Resp:  [11-22] 14 (11/04 0630) BP: (127-168)/(64-96) 146/96 (11/04 0630) SpO2:  [91 %-100 %] 100 % (11/04 0630)    Intake/Output from previous day: No intake/output data recorded. Intake/Output this shift: No intake/output data recorded.  PE: General: Pleasant female who is laying in bed in NAD. Niece at bedside. HEENT: Head is normocephalic, atraumatic. C-Collar in place. Sclera are noninjected. EOMI. Ears and nose without any masses or lesions. Heart: HR normal during encounter. Palpable radial and pedal pulses bilaterally. Lungs: CTAB, no wheezes, rhonchi, or rales noted.  Respiratory effort nonlabored. Abd: Soft, NT, ND. MS: Able to move all 4 extremities.  Skin: warm and dry with no masses, lesions, or rashes Psych: A&Ox3 with an appropriate affect.    Lab Results:  Recent Labs    03/16/24 1919 03/16/24 1955 03/17/24 0227  WBC 4.3  --  4.7  HGB 12.9 14.3 11.3*  HCT 41.4 42.0 35.5*  PLT 149*  --  122*   BMET Recent Labs    03/16/24 1919 03/16/24 1955 03/17/24 0227  NA 139 137 136  K 4.2 4.1 4.0  CL 95* 99 95*  CO2 28  --  28  GLUCOSE 101* 100* 103*  BUN 24* 27* 28*  CREATININE 5.77* 5.80* 6.13*  CALCIUM  10.2  --  9.4   PT/INR Recent Labs    03/16/24 1919  LABPROT 17.6*  INR 1.4*   CMP     Component Value Date/Time   NA 136 03/17/2024 0227   NA 142 11/28/2021 0928   K 4.0 03/17/2024 0227   CL 95 (L) 03/17/2024 0227   CO2 28 03/17/2024 0227   GLUCOSE 103 (H) 03/17/2024 0227    BUN 28 (H) 03/17/2024 0227   BUN 45 (H) 11/28/2021 0928   CREATININE 6.13 (H) 03/17/2024 0227   CREATININE 6.42 (H) 10/25/2022 1511   CALCIUM  9.4 03/17/2024 0227   PROT 7.5 03/16/2024 1919   ALBUMIN  3.4 (L) 03/17/2024 0227   AST 22 03/16/2024 1919   AST 11 (L) 10/25/2022 1511   ALT 14 03/16/2024 1919   ALT 8 10/25/2022 1511   ALKPHOS 170 (H) 03/16/2024 1919   BILITOT 0.8 03/16/2024 1919   BILITOT 0.5 10/25/2022 1511   GFRNONAA 7 (L) 03/17/2024 0227   GFRNONAA 6 (L) 10/25/2022 1511   Lipase  No results found for: LIPASE     Studies/Results: CT CHEST ABDOMEN PELVIS W CONTRAST Result Date: 03/16/2024 EXAM: CT CHEST, ABDOMEN AND PELVIS WITH CONTRAST 03/16/2024 10:32:39 PM TECHNIQUE: CT of the chest, abdomen and pelvis was performed with the administration of 75 mL of iohexol  (OMNIPAQUE ) 350 MG/ML injection. Multiplanar reformatted images are provided for review. Automated exposure control, iterative reconstruction, and/or weight based adjustment of the mA/kV was utilized to reduce the radiation dose to as low as reasonably achievable. COMPARISON: 11/20/2022 CLINICAL HISTORY: fall on thinners FINDINGS: CHEST: MEDIASTINUM AND LYMPH NODES: Marked cardiomegaly coronary artery and aortic atherosclerosis. Marked dilatation of the main pulmonary artery measuring 6.4 cm. The central airways are  clear. No mediastinal, hilar or axillary lymphadenopathy. LUNGS AND PLEURA: Moderate emphysema. An 11 mm right lower lobe pulmonary nodule is stable. Left base atelectasis. No focal consolidation or pulmonary edema. No pleural effusion or pneumothorax. ABDOMEN AND PELVIS: LIVER: Innumerable cysts throughout the liver unchanged since prior study. GALLBLADDER AND BILE DUCTS: Gallbladder is unremarkable. No biliary ductal dilatation. SPLEEN: No acute abnormality. PANCREAS: No acute abnormality. ADRENAL GLANDS: No acute abnormality. KIDNEYS, URETERS AND BLADDER: Innumerable cysts replace much of the renal  parenchyma bilaterally, compatible with polycystic kidney disease, unchanged. No stones in the kidneys or ureters. No hydronephrosis. No perinephric or periureteral stranding. Urinary bladder decompressed, grossly unremarkable. GI AND BOWEL: Stomach demonstrates no acute abnormality. Diffuse colonic diverticulosis. There is no bowel obstruction. REPRODUCTIVE ORGANS: Calcified fibroids in the uterus. PERITONEUM AND RETROPERITONEUM: No ascites. No free air. VASCULATURE: Aorta is normal in caliber with aortic and aortoiliac atherosclerosis. ABDOMINAL AND PELVIS LYMPH NODES: No lymphadenopathy. BONES AND SOFT TISSUES: No acute osseous abnormality. No focal soft tissue abnormality. IMPRESSION: 1. No evidence of acute traumatic injury. 2. Marked dilatation of the main pulmonary artery measuring 6.4 cm, which may reflect pulmonary hypertension. 3. Stable 11 mm right lower lobe pulmonary nodule. This could be followed with repeat CT in 1 year to ensure 2 years stability. 4. Marked cardiomegaly. 5. Emphysema. 6. Polycystic kidney disease. 7. Diffuse colonic diverticulosis. Electronically signed by: Franky Crease MD 03/16/2024 11:06 PM EST RP Workstation: [id number]   MR Cervical Spine Wo Contrast Result Date: 03/16/2024 EXAM: MRI CERVICAL SPINE WITH AND WITHOUT CONTRAST 03/16/2024 09:13:37 PM TECHNIQUE: Multiplanar multisequence MRI of the cervical spine was performed without and with the administration of intravenous contrast. COMPARISON: CT cervical spine 03/16/2021 and 01/17/2022. CLINICAL HISTORY: Neck trauma, ligament injury suspected (Age >= 16y); ?AA subluxation s/p fall. FINDINGS: LIMITATIONS: Examination limited by motion artifact. BONES AND ALIGNMENT: Remote ununited displaced type 2 dens fracture. No acute findings of cervical spine fracture is identified. Other than the malalignment of the fracture, the cervical vertebral bodies are normally aligned. As demonstrated on the CT scan, there is mild posterior  subluxation of C2 compared to the lateral masses of C1. This could be acute or chronic. SPINAL CORD: Abnormal T2 and STIR signal intensity in the left side of the cervical cord at C2 at the level of the patient's fracture likely an old injury with myelomalacia but an acute cord contusion cannot be completely excluded. No evidence of hemorrhage but the gradient echo sequence is quite limited. SOFT TISSUES: No paraspinal mass. No obvious hematoma. I don't see any definite surrounding fluid collections or edema to suggest an acute ligamentous injury, but the study is limited. NO DISC PROTRUSIONS, SPINAL OR FORAMINAL STENOSIS. IMPRESSION: 1. Remote ununited displaced type 2 dens fracture with mild posterior subluxation of C2 relative to the lateral masses of C1, indeterminate acuity given motion-limited study without definitive edema or fluid collection to suggest acute ligamentous injury. 2. No acute cervical spine fracture identified. 3. 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 Could not totally exclude the possibility of an acute contusion. Electronically signed by: Maude Stammer MD 03/16/2024 09:48 PM EST RP Workstation: [id number]   CT Cervical Spine Wo Contrast Result Date: 03/16/2024 EXAM: CT CERVICAL SPINE WITHOUT CONTRAST 03/16/2024 07:47:29 PM TECHNIQUE: CT of the cervical spine was performed without the administration of intravenous contrast. Multiplanar reformatted images are provided for review. Automated exposure control, iterative reconstruction, and/or weight  based adjustment of the mA/kV was utilized to reduce the radiation dose to as low as reasonably achievable. COMPARISON: ct c spine 9.6.23 CLINICAL HISTORY: Neck trauma (Age >= 65y) FINDINGS: CERVICAL SPINE: BONES AND ALIGNMENT: Chronic non-fused healed type 2 dens fracture with interval increased subluxation of the dens anteriorly in relation to the  remainder of the C2 vertebral body (6 mm compared to prior 4 mm). Only partial articulation of the C1 lateral masses and C2 articular facets (6:20,36). No acute fracture or traumatic malalignment. DEGENERATIVE CHANGES: Interval stenosis and mass effect on the central canal at the C1-C2 level (6:28). SOFT TISSUES: Atherosclerotic plaque in the carotid arteries within the neck. No prevertebral soft tissue swelling. Recommend further evaluation with MRI cervical spine noncontrast. IMPRESSION: 1. 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 Recommend C-collar placement and noncontrast MRI of the cervical spine for further evaluation. 2. No acute fracture. 3. These findings were discussed with Dr. Pamella over the phone with Dr. Margarite on 04/12/2024 at 08:09 pm . Electronically signed by: Kate Margarite MD 03/16/2024 08:11 PM EST RP Workstation: [id number]   DG Hip Unilat W or Wo Pelvis 2-3 Views Left Result Date: 03/16/2024 EXAM: 2 or 3 VIEW(S) XRAY OF THE LEFT HIP 03/16/2024 07:55:00 PM COMPARISON: CT abdomen and pelvis 11/20/22 CLINICAL HISTORY: L hip pain s/p fall FINDINGS: BONES AND JOINTS: Left inferior pubic ramus lucency suggestive of underlying fracture. The hip joint is maintained. No significant degenerative changes. Grossly unremarkable sacrum -limited evaluation due to overlapping osseous structures and overlying soft tissues. SOFT TISSUES: Severe atherosclerotic plaque. Coarsened calcifications overlying the pelvis consistent with known fibroids. IMPRESSION: 1. Left inferior pubic ramus lucency suspicious for fracture. Electronically signed by: Morgane Naveau MD 03/16/2024 08:01 PM EST RP Workstation: [id number]   DG Knee 2 Views Left Result Date: 03/16/2024 EXAM: 1 or 2 VIEW(S) XRAY OF THE LEFT KNEE 03/16/2024 07:55:00 PM COMPARISON: None available. CLINICAL HISTORY: L hip pain s/p fall FINDINGS: BONES AND JOINTS: No acute fracture. No focal osseous  lesion. No joint dislocation. No significant joint effusion. No significant degenerative changes. SOFT TISSUES: The soft tissues are unremarkable. VASCULATURE: Severe atherosclerotic plaque. IMPRESSION: 1. No acute fracture or dislocation. 2. Severe atherosclerotic plaque. Electronically signed by: Morgane Naveau MD 03/16/2024 07:59 PM EST RP Workstation: [id number]   CT Head Wo Contrast Result Date: 03/16/2024 EXAM: CT HEAD WITHOUT CONTRAST 03/16/2024 07:47:29 PM TECHNIQUE: CT of the head was performed without the administration of intravenous contrast. Automated exposure control, iterative reconstruction, and/or weight based adjustment of the mA/kV was utilized to reduce the radiation dose to as low as reasonably achievable. COMPARISON: CT head 01/17/22 CLINICAL HISTORY: Head trauma, minor (Age >= 65y). FINDINGS: BRAIN AND VENTRICLES: Diffuse cerebral and cerebellar atrophy. Patchy and confluent areas of decreased attenuation are noted throughout the deep and periventricular white matter of the cerebral hemispheres bilaterally, suggestive of chronic microvascular ischemic changes. Atherosclerotic calcifications are present within the cavernous internal carotid and vertebral arteries. No acute hemorrhage. No evidence of acute infarct. No hydrocephalus. No extra-axial collection. No mass effect or midline shift. ORBITS: No acute abnormality. SINUSES: Paranasal sinuses are clear. SOFT TISSUES AND SKULL: Partial right mastoid air cell effusion. No fluid within the right middle ear. Partial left mastoid air cell effusion. No fluid within the left middle ear. No acute soft tissue abnormality. No skull fracture. IMPRESSION: 1. No acute intracranial abnormality. 2. Partial right and left mastoid air cell effusions. Electronically signed by: Morgane Naveau MD  03/16/2024 07:58 PM EST RP Workstation: [id number]   DG Pelvis Portable Result Date: 03/16/2024 CLINICAL DATA:  Trauma EXAM: PORTABLE PELVIS 1-2 VIEWS COMPARISON:   None Available. FINDINGS: Osteopenia.No evidence of pelvic bone diastasis.Subtle lucency through the inferior pubic ramus on the left. Subtle cortical irregularity may also be present along the superior pubic ramus on the left.There is no evidence of arthropathy or other focal bone abnormality. Diffuse aortoiliac atherosclerosis with extensive peripheral vascular atherosclerosis. IMPRESSION: Osteopenia. Findings worrisome for left inferior and superior pubic rami fractures. Electronically Signed   By: Rogelia Myers M.D.   On: 03/16/2024 19:32   DG Chest Port 1 View Result Date: 03/16/2024 CLINICAL DATA:  Trauma , fall EXAM: PORTABLE CHEST - 1 VIEW COMPARISON:  Oct 02, 2022, Oct 01, 2022 FINDINGS: Chronic coarsening of the pulmonary interstitium with streaky opacities in the left mid and lower lung zones, likely emphysema with atelectasis or scarring, unchanged. No new airspace consolidation, pleural effusion, or pneumothorax. Similar elevation of the left hemidiaphragm. Mild cardiomegaly. Interval decrease in size of the pericardial effusion. Tortuous aorta with aortic atherosclerosis. No acute fracture or destructive lesions. Multilevel thoracic osteophytosis. Likely loose body in the left shoulder joint. Diffuse osteopenia. IMPRESSION: No acute cardiopulmonary abnormality. Electronically Signed   By: Rogelia Myers M.D.   On: 03/16/2024 19:28    Anti-infectives: Anti-infectives (From admission, onward)    None        Assessment/Plan 73 year old female status post fall. C1-C2 subluxation - MRI completed. Neurosurgery recommending C-Collar at all time. Can mobilize. Will need follow up with Dr. Louis in 2 weeks as outpatient. Left pubic rami fracture - Ortho consulted. Nonoperative management. WBAC LLE. Will need xrays prior to follow up in 4 weeks.    -PT/OT evaluations pending.  FEN: Renal diet with fluid restrictions VTE: SCDs ID: None currently  -per [facility]- Fall, mechanical,  POA C1-C2 subluxation, POA Left-sided pubic rami fracture, POA Paroxysmal A-fib on Eliquis  PVD ESRD on HD MWF    LOS: 1 day   I reviewed hospitalist notes, consulting provider notes, nursing notes, last 24 h vitals and pain scores, last 48 h intake and output, last 24 h labs and trends, and last 24 h imaging results.  This care required moderate level of medical decision making.    Marjorie Carlyon Favre, [facility] Surgery 03/17/2024, 11:14 AM Please see Amion for pager number during day hours 7:00am-4:30pm

## 2024-03-17 NOTE — Progress Notes (Signed)
 PROGRESS NOTE    Gloria Gallagher  FMW:[id number] DOB: 1951-03-20 DOA: 03/16/2024 PCP: Campbell Reynolds, NP    Brief Narrative:  Gloria Gallagher is a 73 y.o. female with medical history significant for ESRD on HD MWF, last hemodialysis session was today (3 1/2 hrs), peripheral artery disease, paroxysmal atrial fibrillation on Eliquis , chronic HFpEF, who presents to the ER after a mechanical fall at home.  The patient was attempting to hit a fly, lost her balance and fell.  She was doing fine prior to this fall.  EMS was called.  Level 2 trauma was activated due to the patient being on blood thinners.  No loss of consciousness.  Had pelvic pain.  Left-sided pubic rami fracture and C1-C2 subluxation revealed on CT scan.  Seen by trauma team and orthopedic surgery.  Recommended nonoperative management.  Weightbearing as tolerated.  Return to clinic in 4 weeks for x-ray following discharge.  Pain control.  Discussed the case with neurosurgery, recommended to keep the patient on cervical collar.    Assessment and Plan:  Fall, mechanical, POA Left-sided pubic rami fracture and C1-C2 subluxation revealed on CT scan.  Seen by trauma team and orthopedic surgery.  Recommended nonoperative management.  Weightbearing as tolerated.  Return to clinic in 4 weeks for x-ray following discharge.  Pain control.  Discussed the case with neurosurgery, recommended to keep the patient on cervical collar.   Continue pain control and fall precautions. Continue to keep the patient on cervical collar as recommended by neurosurgery. PT OT evaluation   C1-C2 subluxation, POA Remote ununited displaced type II dens fracture with mild posterior subluxation of C2 relative to the lateral masses of C1, indeterminate acuity given Motion limited study without definitive edema or fluid collection to suggest acute ligamentous injury Neurosurgery saw on 11/4: Recommend hard cervical collar at all times. She is fine to mobilize from  our perspective with the hard cervical collar. She should follow up with Dr. Louis in the office in two weeks.    Left-sided pubic rami fracture, POA Weightbearing as tolerated PT OT evaluation under the guidance of orthopedic surgery. Will need to follow-up outpatient 4 weeks after discharge for repeat imaging.   Paroxysmal A-fib on Eliquis  Eliquis  on hold for now Resume home amiodarone  for rate control Continue to monitor on telemetry   PVD Resume antiplatelets when okay with orthopedic surgery and neurosurgery. Resume home statin   ESRD on HD MWF Last hemodialysis was on Monday, 03/16/2024. - Nephrology aware of admission  DVT prophylaxis: SCDs Start: 03/16/24 2326    Code Status: Full Code Family Communication:   Disposition Plan:  Level of care: Telemetry Status is: Inpatient     Consultants:  Trauma Ortho Neurosurgery   Subjective: Having trouble sitting up  Objective: Vitals:   03/17/24 0415 03/17/24 0630 03/17/24 0701 03/17/24 1144  BP: (!) 143/80 (!) 146/96  (!) 175/68  Pulse:  81  77  Resp: 11 14  14   Temp:   97.6 F (36.4 C) 97.6 F (36.4 C)  TempSrc:   Oral   SpO2:  100%  95%   No intake or output data in the 24 hours ending 03/17/24 1216 There were no vitals filed for this visit.  Examination:   General: Appearance:     Overweight female who appears uncomfortable, wearing hard neck collar     Lungs:     Clear to auscultation bilaterally, respirations unlabored  Heart:    Normal heart rate.   MS:  All extremities are intact.    Neurologic:   Awake, alert       Data Reviewed: I have personally reviewed following labs and imaging studies  CBC: Recent Labs  Lab 03/16/24 1919 03/16/24 1955 03/17/24 0227  WBC 4.3  --  4.7  HGB 12.9 14.3 11.3*  HCT 41.4 42.0 35.5*  MCV 96.3  --  95.9  PLT 149*  --  122*   Basic Metabolic Panel: Recent Labs  Lab 03/16/24 1919 03/16/24 1955 03/17/24 0227  NA 139 137 136  K 4.2 4.1 4.0  CL  95* 99 95*  CO2 28  --  28  GLUCOSE 101* 100* 103*  BUN 24* 27* 28*  CREATININE 5.77* 5.80* 6.13*  CALCIUM  10.2  --  9.4  PHOS  --   --  4.6   GFR: CrCl cannot be calculated (Unknown ideal weight.). Liver Function Tests: Recent Labs  Lab 03/16/24 1919 03/17/24 0227  AST 22  --   ALT 14  --   ALKPHOS 170*  --   BILITOT 0.8  --   PROT 7.5  --   ALBUMIN  3.6 3.4*   No results for input(s): LIPASE, AMYLASE in the last 168 hours. No results for input(s): AMMONIA in the last 168 hours. Coagulation Profile: Recent Labs  Lab 03/16/24 1919  INR 1.4*   Cardiac Enzymes: No results for input(s): CKTOTAL, CKMB, CKMBINDEX, TROPONINI in the last 168 hours. BNP (last 3 results) No results for input(s): PROBNP in the last 8760 hours. HbA1C: No results for input(s): HGBA1C in the last 72 hours. CBG: No results for input(s): GLUCAP in the last 168 hours. Lipid Profile: No results for input(s): CHOL, HDL, LDLCALC, TRIG, CHOLHDL, LDLDIRECT in the last 72 hours. Thyroid  Function Tests: No results for input(s): TSH, T4TOTAL, FREET4, T3FREE, THYROIDAB in the last 72 hours. Anemia Panel: No results for input(s): VITAMINB12, FOLATE, FERRITIN, TIBC, IRON , RETICCTPCT in the last 72 hours. Sepsis Labs: Recent Labs  Lab 03/16/24 1955  LATICACIDVEN 2.8*    No results found for this or any previous visit (from the past 240 hours).       Radiology Studies: CT CHEST ABDOMEN PELVIS W CONTRAST Result Date: 03/16/2024 EXAM: CT CHEST, ABDOMEN AND PELVIS WITH CONTRAST 03/16/2024 10:32:39 PM TECHNIQUE: CT of the chest, abdomen and pelvis was performed with the administration of 75 mL of iohexol  (OMNIPAQUE ) 350 MG/ML injection. Multiplanar reformatted images are provided for review. Automated exposure control, iterative reconstruction, and/or weight based adjustment of the mA/kV was utilized to reduce the radiation dose to as low as reasonably  achievable. COMPARISON: 11/20/2022 CLINICAL HISTORY: fall on thinners FINDINGS: CHEST: MEDIASTINUM AND LYMPH NODES: Marked cardiomegaly coronary artery and aortic atherosclerosis. Marked dilatation of the main pulmonary artery measuring 6.4 cm. The central airways are clear. No mediastinal, hilar or axillary lymphadenopathy. LUNGS AND PLEURA: Moderate emphysema. An 11 mm right lower lobe pulmonary nodule is stable. Left base atelectasis. No focal consolidation or pulmonary edema. No pleural effusion or pneumothorax. ABDOMEN AND PELVIS: LIVER: Innumerable cysts throughout the liver unchanged since prior study. GALLBLADDER AND BILE DUCTS: Gallbladder is unremarkable. No biliary ductal dilatation. SPLEEN: No acute abnormality. PANCREAS: No acute abnormality. ADRENAL GLANDS: No acute abnormality. KIDNEYS, URETERS AND BLADDER: Innumerable cysts replace much of the renal parenchyma bilaterally, compatible with polycystic kidney disease, unchanged. No stones in the kidneys or ureters. No hydronephrosis. No perinephric or periureteral stranding. Urinary bladder decompressed, grossly unremarkable. GI AND BOWEL: Stomach demonstrates no acute abnormality. Diffuse colonic  diverticulosis. There is no bowel obstruction. REPRODUCTIVE ORGANS: Calcified fibroids in the uterus. PERITONEUM AND RETROPERITONEUM: No ascites. No free air. VASCULATURE: Aorta is normal in caliber with aortic and aortoiliac atherosclerosis. ABDOMINAL AND PELVIS LYMPH NODES: No lymphadenopathy. BONES AND SOFT TISSUES: No acute osseous abnormality. No focal soft tissue abnormality. IMPRESSION: 1. No evidence of acute traumatic injury. 2. Marked dilatation of the main pulmonary artery measuring 6.4 cm, which may reflect pulmonary hypertension. 3. Stable 11 mm right lower lobe pulmonary nodule. This could be followed with repeat CT in 1 year to ensure 2 years stability. 4. Marked cardiomegaly. 5. Emphysema. 6. Polycystic kidney disease. 7. Diffuse colonic  diverticulosis. Electronically signed by: Franky Crease MD 03/16/2024 11:06 PM EST RP Workstation: [id number]   MR Cervical Spine Wo Contrast Result Date: 03/16/2024 EXAM: MRI CERVICAL SPINE WITH AND WITHOUT CONTRAST 03/16/2024 09:13:37 PM TECHNIQUE: Multiplanar multisequence MRI of the cervical spine was performed without and with the administration of intravenous contrast. COMPARISON: CT cervical spine 03/16/2021 and 01/17/2022. CLINICAL HISTORY: Neck trauma, ligament injury suspected (Age >= 16y); ?AA subluxation s/p fall. FINDINGS: LIMITATIONS: Examination limited by motion artifact. BONES AND ALIGNMENT: Remote ununited displaced type 2 dens fracture. No acute findings of cervical spine fracture is identified. Other than the malalignment of the fracture, the cervical vertebral bodies are normally aligned. As demonstrated on the CT scan, there is mild posterior subluxation of C2 compared to the lateral masses of C1. This could be acute or chronic. SPINAL CORD: Abnormal T2 and STIR signal intensity in the left side of the cervical cord at C2 at the level of the patient's fracture likely an old injury with myelomalacia but an acute cord contusion cannot be completely excluded. No evidence of hemorrhage but the gradient echo sequence is quite limited. SOFT TISSUES: No paraspinal mass. No obvious hematoma. I don't see any definite surrounding fluid collections or edema to suggest an acute ligamentous injury, but the study is limited. NO DISC PROTRUSIONS, SPINAL OR FORAMINAL STENOSIS. IMPRESSION: 1. Remote ununited displaced type 2 dens fracture with mild posterior subluxation of C2 relative to the lateral masses of C1, indeterminate acuity given motion-limited study without definitive edema or fluid collection to suggest acute ligamentous injury. 2. No acute cervical spine fracture identified. 3. Left-sided focal cord signal abnormality at the level of the remote C2 fracture, most likely myelomalacia related to a  prior contusion. Could not totally exclude the possibility of an acute contusion. Electronically signed by: Maude Stammer MD 03/16/2024 09:48 PM EST RP Workstation: [id number]   CT Cervical Spine Wo Contrast Result Date: 03/16/2024 EXAM: CT CERVICAL SPINE WITHOUT CONTRAST 03/16/2024 07:47:29 PM TECHNIQUE: CT of the cervical spine was performed without the administration of intravenous contrast. Multiplanar reformatted images are provided for review. Automated exposure control, iterative reconstruction, and/or weight based adjustment of the mA/kV was utilized to reduce the radiation dose to as low as reasonably achievable. COMPARISON: ct c spine 9.6.23 CLINICAL HISTORY: Neck trauma (Age >= 65y) FINDINGS: CERVICAL SPINE: BONES AND ALIGNMENT: Chronic non-fused healed type 2 dens fracture with interval increased subluxation of the dens anteriorly in relation to the remainder of the C2 vertebral body (6 mm compared to prior 4 mm). Only partial articulation of the C1 lateral masses and C2 articular facets (6:20,36). No acute fracture or traumatic malalignment. DEGENERATIVE CHANGES: Interval stenosis and mass effect on the central canal at the C1-C2 level (6:28). SOFT TISSUES: Atherosclerotic plaque in the carotid arteries within the neck. No prevertebral soft tissue swelling. Recommend further  evaluation with MRI cervical spine noncontrast. IMPRESSION: 1. 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 Recommend C-collar placement and noncontrast MRI of the cervical spine for further evaluation. 2. No acute fracture. 3. These findings were discussed with Dr. Pamella over the phone with Dr. Margarite on 04/12/2024 at 08:09 pm . Electronically signed by: Kate Margarite MD 03/16/2024 08:11 PM EST RP Workstation: [id number]   DG Hip Unilat W or Wo Pelvis 2-3 Views Left Result Date: 03/16/2024 EXAM: 2 or 3 VIEW(S) XRAY OF THE LEFT HIP 03/16/2024 07:55:00 PM COMPARISON: CT  abdomen and pelvis 11/20/22 CLINICAL HISTORY: L hip pain s/p fall FINDINGS: BONES AND JOINTS: Left inferior pubic ramus lucency suggestive of underlying fracture. The hip joint is maintained. No significant degenerative changes. Grossly unremarkable sacrum -limited evaluation due to overlapping osseous structures and overlying soft tissues. SOFT TISSUES: Severe atherosclerotic plaque. Coarsened calcifications overlying the pelvis consistent with known fibroids. IMPRESSION: 1. Left inferior pubic ramus lucency suspicious for fracture. Electronically signed by: Morgane Naveau MD 03/16/2024 08:01 PM EST RP Workstation: [id number]   DG Knee 2 Views Left Result Date: 03/16/2024 EXAM: 1 or 2 VIEW(S) XRAY OF THE LEFT KNEE 03/16/2024 07:55:00 PM COMPARISON: None available. CLINICAL HISTORY: L hip pain s/p fall FINDINGS: BONES AND JOINTS: No acute fracture. No focal osseous lesion. No joint dislocation. No significant joint effusion. No significant degenerative changes. SOFT TISSUES: The soft tissues are unremarkable. VASCULATURE: Severe atherosclerotic plaque. IMPRESSION: 1. No acute fracture or dislocation. 2. Severe atherosclerotic plaque. Electronically signed by: Morgane Naveau MD 03/16/2024 07:59 PM EST RP Workstation: [id number]   CT Head Wo Contrast Result Date: 03/16/2024 EXAM: CT HEAD WITHOUT CONTRAST 03/16/2024 07:47:29 PM TECHNIQUE: CT of the head was performed without the administration of intravenous contrast. Automated exposure control, iterative reconstruction, and/or weight based adjustment of the mA/kV was utilized to reduce the radiation dose to as low as reasonably achievable. COMPARISON: CT head 01/17/22 CLINICAL HISTORY: Head trauma, minor (Age >= 65y). FINDINGS: BRAIN AND VENTRICLES: Diffuse cerebral and cerebellar atrophy. Patchy and confluent areas of decreased attenuation are noted throughout the deep and periventricular white matter of the cerebral hemispheres bilaterally, suggestive of chronic  microvascular ischemic changes. Atherosclerotic calcifications are present within the cavernous internal carotid and vertebral arteries. No acute hemorrhage. No evidence of acute infarct. No hydrocephalus. No extra-axial collection. No mass effect or midline shift. ORBITS: No acute abnormality. SINUSES: Paranasal sinuses are clear. SOFT TISSUES AND SKULL: Partial right mastoid air cell effusion. No fluid within the right middle ear. Partial left mastoid air cell effusion. No fluid within the left middle ear. No acute soft tissue abnormality. No skull fracture. IMPRESSION: 1. No acute intracranial abnormality. 2. Partial right and left mastoid air cell effusions. Electronically signed by: Morgane Naveau MD 03/16/2024 07:58 PM EST RP Workstation: [id number]   DG Pelvis Portable Result Date: 03/16/2024 CLINICAL DATA:  Trauma EXAM: PORTABLE PELVIS 1-2 VIEWS COMPARISON:  None Available. FINDINGS: Osteopenia.No evidence of pelvic bone diastasis.Subtle lucency through the inferior pubic ramus on the left. Subtle cortical irregularity may also be present along the superior pubic ramus on the left.There is no evidence of arthropathy or other focal bone abnormality. Diffuse aortoiliac atherosclerosis with extensive peripheral vascular atherosclerosis. IMPRESSION: Osteopenia. Findings worrisome for left inferior and superior pubic rami fractures. Electronically Signed   By: Rogelia Myers M.D.   On: 03/16/2024 19:32   DG Chest Port 1 View Result Date: 03/16/2024 CLINICAL DATA:  Trauma , fall EXAM: PORTABLE  CHEST - 1 VIEW COMPARISON:  Oct 02, 2022, Oct 01, 2022 FINDINGS: Chronic coarsening of the pulmonary interstitium with streaky opacities in the left mid and lower lung zones, likely emphysema with atelectasis or scarring, unchanged. No new airspace consolidation, pleural effusion, or pneumothorax. Similar elevation of the left hemidiaphragm. Mild cardiomegaly. Interval decrease in size of the pericardial effusion.  Tortuous aorta with aortic atherosclerosis. No acute fracture or destructive lesions. Multilevel thoracic osteophytosis. Likely loose body in the left shoulder joint. Diffuse osteopenia. IMPRESSION: No acute cardiopulmonary abnormality. Electronically Signed   By: Rogelia Myers M.D.   On: 03/16/2024 19:28        Scheduled Meds:  acetaminophen   1,000 mg Oral TID   amiodarone   200 mg Oral Daily   atorvastatin   20 mg Oral QPM   [START ON 03/18/2024] Chlorhexidine  Gluconate Cloth  6 each Topical Q0600   Continuous Infusions:   LOS: 1 day    Time spent: 45 minutes spent on chart review, discussion with nursing staff, consultants, updating family and interview/physical exam; more than 50% of that time was spent in counseling and/or coordination of care.    Harlene RAYMOND Bowl, DO [facility] Available via Epic secure chat 7am-7pm After these hours, please refer to coverage provider listed on [email or web address] 03/17/2024, 12:16 PM

## 2024-03-17 NOTE — Evaluation (Signed)
 Occupational Therapy Evaluation Patient Details Name: Gloria Gallagher MRN: [id number] DOB: 03-Jun-1950 Today's Date: 03/17/2024   History of Present Illness   The pt is a 73 yo female presenting 11/3 after a fall on blood thinners. CT showed C1-2 subluxation, neurosurgery consulted, and L-sided pubic rami fx. PMH includes: anemia, arthritis, afib, CAD, ESRD on HD MWF, and neuropathy.     Clinical Impressions This 73 yo female admitted with above presents to acute OT with PLOF of being relatively independent with all basic ADLs with occasional A from sister Gloria Gallagher). Currently she is setup-max A for basic ADLs from a RW level. She will continue to benefit from acute OT with follow up HHOT as long as sister can be there 24/7 for at least a week --other other family.     If plan is discharge home, recommend the following:   A little help with walking and/or transfers;A lot of help with bathing/dressing/bathroom;Assistance with cooking/housework;Assist for transportation;Help with stairs or ramp for entrance     Functional Status Assessment   Patient has had a recent decline in their functional status and demonstrates the ability to make significant improvements in function in a reasonable and predictable amount of time.     Equipment Recommendations   None recommended by OT      Precautions/Restrictions   Precautions Precautions: Fall;Cervical Precaution Booklet Issued: Yes (comment) Recall of Precautions/Restrictions: Intact Required Braces or Orthoses: Cervical Brace Cervical Brace: Hard collar;At all times Restrictions Weight Bearing Restrictions Per Provider Order: Yes LLE Weight Bearing Per Provider Order: Weight bearing as tolerated     Mobility Bed Mobility Overal bed mobility: Needs Assistance Bed Mobility: Supine to Sit, Sit to Supine     Supine to sit: HOB elevated, Min assist, Used rails Sit to supine: Mod assist, Used rails   General bed mobility  comments: pt needed A for LLE for OOB and Bil LEs for back in bed    Transfers Overall transfer level: Needs assistance Equipment used: Rolling walker (2 wheels) Transfers: Sit to/from Stand Sit to Stand: Min assist           General transfer comment: VCs for safe hand placement      Balance Overall balance assessment: Needs assistance Sitting-balance support: No upper extremity supported, Feet supported Sitting balance-Leahy Scale: Good     Standing balance support: Bilateral upper extremity supported, Reliant on assistive device for balance Standing balance-Leahy Scale: Poor                             ADL either performed or assessed with clinical judgement   ADL Overall ADL's : Needs assistance/impaired Eating/Feeding: Independent;Sitting   Grooming: Set up;Sitting   Upper Body Bathing: Set up;Sitting   Lower Body Bathing: Moderate assistance Lower Body Bathing Details (indicate cue type and reason): min A sit<>stand from raised bed (her height at home) Upper Body Dressing : Minimal assistance;Sitting   Lower Body Dressing: Maximal assistance Lower Body Dressing Details (indicate cue type and reason): min A sit<>stand from raised bed (her height at home) Toilet Transfer: Minimal assistance;Ambulation;Rolling walker (2 wheels) Toilet Transfer Details (indicate cue type and reason): simulated bed>around to other side of bed Toileting- Clothing Manipulation and Hygiene: Minimal assistance;Sit to/from stand               Vision Baseline Vision/History: 1 Wears glasses Ability to See in Adequate Light: 0 Adequate Patient Visual Report: No change from baseline  Pertinent Vitals/Pain Pain Assessment Pain Assessment: Faces Faces Pain Scale: Hurts little more Pain Location: left pubic area Pain Descriptors / Indicators: Aching, Sore Pain Intervention(s): Limited activity within patient's tolerance, Monitored during session,  Repositioned     Extremity/Trunk Assessment Upper Extremity Assessment Upper Extremity Assessment: Overall WFL for tasks assessed           Communication Communication Communication: No apparent difficulties   Cognition Arousal: Alert Behavior During Therapy: WFL for tasks assessed/performed                                 Following commands: Intact       Cueing  General Comments   Cueing Techniques: Verbal cues  Pt on 2 liters of O2 with sats 95%, on RA after ambulating around bed pt's O2 sats 91%--pt left on 2 liters at end of session           Home Living Family/patient expects to be discharged to:: Private residence Living Arrangements: Other relatives (sister) Available Help at Discharge: Family;Available PRN/intermittently Type of Home: House Home Access: Level entry     Home Layout: Two level;Able to live on main level with bedroom/bathroom     Bathroom Shower/Tub: Producer, Television/film/video: Standard Bathroom Accessibility: Yes   Home Equipment: Agricultural Consultant (2 wheels);Cane - single point;Shower seat;BSC/3in1;Hand held shower head          Prior Functioning/Environment Prior Level of Function : Needs assist             Mobility Comments: walks with a cane ADLs Comments: reports independent but notes from last admission in may states sister assists with ADLs    OT Problem List: Decreased strength;Decreased range of motion;Impaired balance (sitting and/or standing);Pain   OT Treatment/Interventions: Self-care/ADL training;DME and/or AE instruction;Balance training;Patient/family education      OT Goals(Current goals can be found in the care plan section)   Acute Rehab OT Goals Patient Stated Goal: to go home and not to rehab first OT Goal Formulation: With patient Time For Goal Achievement: 03/31/24 Potential to Achieve Goals: Good   OT Frequency:  Min 2X/week       AM-PAC OT 6 Clicks Daily Activity      Outcome Measure Help from another person eating meals?: None Help from another person taking care of personal grooming?: A Little Help from another person toileting, which includes using toliet, bedpan, or urinal?: A Little Help from another person bathing (including washing, rinsing, drying)?: A Lot Help from another person to put on and taking off regular upper body clothing?: A Little Help from another person to put on and taking off regular lower body clothing?: A Lot 6 Click Score: 17   End of Session Equipment Utilized During Treatment: Gait belt;Rolling walker (2 wheels);Cervical collar Nurse Communication: Mobility status (via secure chat)  Activity Tolerance: Patient tolerated treatment well Patient left: in bed;with call bell/phone within reach;with bed alarm set  OT Visit Diagnosis: Unsteadiness on feet (R26.81);Other abnormalities of gait and mobility (R26.89);Pain;Muscle weakness (generalized) (M62.81) Pain - Right/Left: Left Pain - part of body: Leg                Time: 1530-1556 OT Time Calculation (min): 26 min Charges:  OT General Charges $OT Visit: 1 Visit OT Evaluation $OT Eval Moderate Complexity: 1 Mod OT Treatments $Self Care/Home Management : 8-22 mins  Donny BECKER OT Acute Rehabilitation Services Office [phone]  [place] Gloria Gallagher 03/17/2024, 4:09 PM

## 2024-03-17 NOTE — Consult Note (Signed)
 Providing Compassionate, Quality Care - Together   Reason for Consult: Type 2 dens fracture Referring Physician: Dr. Shona Nena DELENA Gloria Gallagher is an 73 y.o. female.  HPI: Gloria Gallagher is a 73 year old femal with extensive past medical history outline below. She suffered a fall at home on Eliquis , without loss of consciousness. 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 C-spine MRI showed left-sided focal cord signal abnormality at the level of the remote C2 fracture. Neurosurgery was consulted for further evaluation and recommendations.  The patient denies weakness, loss of dexterity, or gait instability. She denies neck pain presently. She is wearing a hard cervical collar.   Past Medical History:  Diagnosis Date   Acute hypoxic respiratory failure (HCC) 10/01/2022   Acute respiratory failure (HCC) 10/01/2022   Anemia    Aortic atherosclerosis    Arthritis    Atrial fibrillation (HCC)    CAD (coronary artery disease)    COVID 2021   mild   ESRD on hemodialysis (HCC)    MWF at [facility]   GERD (gastroesophageal reflux disease)    Gout    Heart murmur    Hyperparathyroidism    Hypertension    in the past, no longer takes medications   Neuromuscular disorder (HCC)    neuropathy   Pneumonia    Seasonal allergies     Past Surgical History:  Procedure Laterality Date   ABDOMINAL AORTOGRAM N/A 08/22/2023   Procedure: ABDOMINAL AORTOGRAM;  Surgeon: Gretta Lonni PARAS, MD;  Location: [facility] IN[facility];  Service: Cardiovascular;  Laterality: N/A;   ABDOMINAL AORTOGRAM W/LOWER EXTREMITY N/A 12/13/2022   Procedure: ABDOMINAL AORTOGRAM W/LOWER EXTREMITY;  Surgeon: Gretta Lonni PARAS, MD;  Location: [facility];  Service: Cardiovascular;  Laterality: N/A;   CARDIOVERSION N/A 05/16/2021   Procedure: CARDIOVERSION;  Surgeon: Alveta Aleene PARAS, MD;  Location: [facility] ENDOSCOPY;  Service: Cardiovascular;  Laterality: N/A;    CARDIOVERSION N/A 08/01/2021   Procedure: CARDIOVERSION;  Surgeon: Francyne Headland, MD;  Location: [facility] ENDOSCOPY;  Service: Cardiovascular;  Laterality: N/A;   CARDIOVERSION N/A 12/05/2021   Procedure: CARDIOVERSION;  Surgeon: Sheena Pugh, DO;  Location: [facility] ENDOSCOPY;  Service: Cardiovascular;  Laterality: N/A;   DIALYSIS FISTULA CREATION Left    FEMORAL-POPLITEAL BYPASS GRAFT Left 09/25/2023   Procedure: BYPASS GRAFT FEMORAL-POPLITEAL ARTERY;  Surgeon: Magda Debby SAILOR, MD;  Location: [facility] OR;  Service: Vascular;  Laterality: Left;   LOWER EXTREMITY ANGIOGRAPHY N/A 08/22/2023   Procedure: Lower Extremity Angiography;  Surgeon: Gretta Lonni PARAS, MD;  Location: [facility] INVASIVE CV LAB;  Service: Cardiovascular;  Laterality: N/A;   LOWER EXTREMITY INTERVENTION N/A 08/22/2023   Procedure: LOWER EXTREMITY INTERVENTION;  Surgeon: Gretta Lonni PARAS, MD;  [facility]: [facility];  Service: Cardiovascular;  Laterality: N/A;   PERICARDIOCENTESIS N/A 10/02/2022   Procedure: PERICARDIOCENTESIS;  Surgeon: Verlin Lonni BIRCH, MD;  Location: [facility];  Service: Cardiovascular;  Laterality: N/A;   PERIPHERAL INTRAVASCULAR LITHOTRIPSY  08/22/2023   Procedure: PERIPHERAL INTRAVASCULAR LITHOTRIPSY;  Surgeon: Gretta Lonni PARAS, MD;  Location: [facility];  Service: Cardiovascular;;   REVISON OF ARTERIOVENOUS FISTULA Left 02/16/2022   Procedure: LEFT ARM ARTERIOVENOUS FISTULA REVISION;  Surgeon: Magda Debby SAILOR, MD;  Location: [facility] OR;  Service: Vascular;  Laterality: Left;  PERIPHERAL NERVE BLOCK   TUBAL LIGATION      Family History  Problem Relation Age of Onset   Diabetes Sister  Stomach cancer Neg Hx    Colon cancer Neg Hx     Social History:  reports that she has quit smoking. Her smoking use included cigarettes. She started smoking about 21 years ago. She has never been exposed to tobacco smoke. She has never used smokeless tobacco. She reports that she does not currently use  alcohol after a past usage of about 1.0 standard drink of alcohol per week. She reports that she does not use drugs.  Allergies:  Allergies  Allergen Reactions   Penicillin G Hives and Itching   Shrimp (Diagnostic) Hives    Medications: I have reviewed the patient's current medications.  Results for orders placed or performed during the hospital encounter of 03/16/24 (from the past 48 hours)  Comprehensive metabolic panel     Status: Abnormal   Collection Time: 03/16/24  7:19 PM  Result Value Ref Range   Sodium 139 135 - 145 mmol/L   Potassium 4.2 3.5 - 5.1 mmol/L   Chloride 95 (L) 98 - 111 mmol/L   CO2 28 22 - 32 mmol/L   Glucose, Bld 101 (H) 70 - 99 mg/dL    Comment: Glucose reference range applies only to samples taken after fasting for at least 8 hours.   BUN 24 (H) 8 - 23 mg/dL   Creatinine, Ser 4.22 (H) 0.44 - 1.00 mg/dL   Calcium  10.2 8.9 - 10.3 mg/dL   Total Protein 7.5 6.5 - 8.1 g/dL   Albumin  3.6 3.5 - 5.0 g/dL   AST 22 15 - 41 U/L   ALT 14 0 - 44 U/L   Alkaline Phosphatase 170 (H) 38 - 126 U/L   Total Bilirubin 0.8 0.0 - 1.2 mg/dL   GFR, Estimated 7 (L) >60 mL/min    Comment: (NOTE) Calculated using the CKD-EPI Creatinine Equation (2021)    Anion gap 16 (H) 5 - 15    Comment: Performed at [facility], [address] [place], [place] [address]  CBC     Status: Abnormal   Collection Time: 03/16/24  7:19 PM  Result Value Ref Range   WBC 4.3 4.0 - 10.5 K/uL   RBC 4.30 3.87 - 5.11 MIL/uL   Hemoglobin 12.9 12.0 - 15.0 g/dL   HCT 58.5 63.9 - 53.9 %   MCV 96.3 80.0 - 100.0 fL   MCH 30.0 26.0 - 34.0 pg   MCHC 31.2 30.0 - 36.0 g/dL   RDW 84.6 88.4 - 84.4 %   Platelets 149 (L) 150 - 400 K/uL   nRBC 0.0 0.0 - 0.2 %    Comment: Performed at [facility], [address] [place], [place] [address]  Ethanol     Status: None   Collection Time: 03/16/24  7:19 PM  Result Value Ref Range   Alcohol, Ethyl (B) <15 <15 mg/dL    Comment: (NOTE) For medical  purposes only. Performed at [facility], [address], [place], [place] [address]   Protime-INR     Status: Abnormal   Collection Time: 03/16/24  7:19 PM  Result Value Ref Range   Prothrombin Time 17.6 (H) 11.4 - 15.2 seconds   INR 1.4 (H) 0.8 - 1.2    Comment: (NOTE) INR goal varies based on device and disease states. Performed at [facility], [address], [place], [place] [address]   Sample to Blood Bank     Status: None   Collection Time: 03/16/24  7:20 PM  Result  Value Ref Range   Blood Bank Specimen SAMPLE AVAILABLE FOR TESTING    Sample Expiration      03/19/2024,2359 Performed at [facility], [address], [place], [place] [address]   I-Stat Chem 8, ED     Status: Abnormal   Collection Time: 03/16/24  7:55 PM  Result Value Ref Range   Sodium 137 135 - 145 mmol/L   Potassium 4.1 3.5 - 5.1 mmol/L   Chloride 99 98 - 111 mmol/L   BUN 27 (H) 8 - 23 mg/dL   Creatinine, Ser 4.19 (H) 0.44 - 1.00 mg/dL   Glucose, Bld 899 (H) 70 - 99 mg/dL    Comment: Glucose reference range applies only to samples taken after fasting for at least 8 hours.   Calcium , Ion 1.09 (L) 1.15 - 1.40 mmol/L   TCO2 31 22 - 32 mmol/L   Hemoglobin 14.3 12.0 - 15.0 g/dL   HCT 57.9 63.9 - 53.9 %  I-Stat Lactic Acid, ED     Status: Abnormal   Collection Time: 03/16/24  7:55 PM  Result Value Ref Range   Lactic Acid, Venous 2.8 (HH) 0.5 - 1.9 mmol/L   Comment NOTIFIED PHYSICIAN   CBC     Status: Abnormal   Collection Time: 03/17/24  2:27 AM  Result Value Ref Range   WBC 4.7 4.0 - 10.5 K/uL   RBC 3.70 (L) 3.87 - 5.11 MIL/uL   Hemoglobin 11.3 (L) 12.0 - 15.0 g/dL   HCT 64.4 (L) 63.9 - 53.9 %   MCV 95.9 80.0 - 100.0 fL   MCH 30.5 26.0 - 34.0 pg   MCHC 31.8 30.0 - 36.0 g/dL   RDW 84.6 88.4 - 84.4 %   Platelets 122 (L) 150 - 400 K/uL   nRBC 0.0 0.0 - 0.2 %    Comment: Performed at [facility], [address] [place], [place] [address]  Renal function panel     Status:  Abnormal   Collection Time: 03/17/24  2:27 AM  Result Value Ref Range   Sodium 136 135 - 145 mmol/L   Potassium 4.0 3.5 - 5.1 mmol/L   Chloride 95 (L) 98 - 111 mmol/L   CO2 28 22 - 32 mmol/L   Glucose, Bld 103 (H) 70 - 99 mg/dL    Comment: Glucose reference range applies only to samples taken after fasting for at least 8 hours.   BUN 28 (H) 8 - 23 mg/dL   Creatinine, Ser 3.86 (H) 0.44 - 1.00 mg/dL   Calcium  9.4 8.9 - 10.3 mg/dL   Phosphorus 4.6 2.5 - 4.6 mg/dL   Albumin  3.4 (L) 3.5 - 5.0 g/dL   GFR, Estimated 7 (L) >60 mL/min    Comment: (NOTE) Calculated using the CKD-EPI Creatinine Equation (2021)    Anion gap 13 5 - 15    Comment: Performed at [facility], [address] [place], [place] [address]    CT CHEST ABDOMEN PELVIS W CONTRAST Result Date: 03/16/2024 EXAM: CT CHEST, ABDOMEN AND PELVIS WITH CONTRAST 03/16/2024 10:32:39 PM TECHNIQUE: CT of the chest, abdomen and pelvis was performed with the administration of 75 mL of iohexol  (OMNIPAQUE ) 350 MG/ML injection. Multiplanar reformatted images are provided for review. Automated exposure control, iterative reconstruction, and/or weight based adjustment of the mA/kV was utilized to reduce the radiation dose to as low as reasonably achievable. COMPARISON: 11/20/2022 CLINICAL HISTORY: fall on thinners FINDINGS: CHEST: MEDIASTINUM AND LYMPH NODES: Marked cardiomegaly coronary artery  and aortic atherosclerosis. Marked dilatation of the main pulmonary artery measuring 6.4 cm. The central airways are clear. No mediastinal, hilar or axillary lymphadenopathy. LUNGS AND PLEURA: Moderate emphysema. An 11 mm right lower lobe pulmonary nodule is stable. Left base atelectasis. No focal consolidation or pulmonary edema. No pleural effusion or pneumothorax. ABDOMEN AND PELVIS: LIVER: Innumerable cysts throughout the liver unchanged since prior study. GALLBLADDER AND BILE DUCTS: Gallbladder is unremarkable. No biliary ductal dilatation. SPLEEN: No  acute abnormality. PANCREAS: No acute abnormality. ADRENAL GLANDS: No acute abnormality. KIDNEYS, URETERS AND BLADDER: Innumerable cysts replace much of the renal parenchyma bilaterally, compatible with polycystic kidney disease, unchanged. No stones in the kidneys or ureters. No hydronephrosis. No perinephric or periureteral stranding. Urinary bladder decompressed, grossly unremarkable. GI AND BOWEL: Stomach demonstrates no acute abnormality. Diffuse colonic diverticulosis. There is no bowel obstruction. REPRODUCTIVE ORGANS: Calcified fibroids in the uterus. PERITONEUM AND RETROPERITONEUM: No ascites. No free air. VASCULATURE: Aorta is normal in caliber with aortic and aortoiliac atherosclerosis. ABDOMINAL AND PELVIS LYMPH NODES: No lymphadenopathy. BONES AND SOFT TISSUES: No acute osseous abnormality. No focal soft tissue abnormality. IMPRESSION: 1. No evidence of acute traumatic injury. 2. Marked dilatation of the main pulmonary artery measuring 6.4 cm, which may reflect pulmonary hypertension. 3. Stable 11 mm right lower lobe pulmonary nodule. This could be followed with repeat CT in 1 year to ensure 2 years stability. 4. Marked cardiomegaly. 5. Emphysema. 6. Polycystic kidney disease. 7. Diffuse colonic diverticulosis. Electronically signed by: Franky Crease MD 03/16/2024 11:06 PM EST RP Workstation: [id number]   MR Cervical Spine Wo Contrast Result Date: 03/16/2024 EXAM: MRI CERVICAL SPINE WITH AND WITHOUT CONTRAST 03/16/2024 09:13:37 PM TECHNIQUE: Multiplanar multisequence MRI of the cervical spine was performed without and with the administration of intravenous contrast. COMPARISON: CT cervical spine 03/16/2021 and 01/17/2022. CLINICAL HISTORY: Neck trauma, ligament injury suspected (Age >= 16y); ?AA subluxation s/p fall. FINDINGS: LIMITATIONS: Examination limited by motion artifact. BONES AND ALIGNMENT: Remote ununited displaced type 2 dens fracture. No acute findings of cervical spine fracture is  identified. Other than the malalignment of the fracture, the cervical vertebral bodies are normally aligned. As demonstrated on the CT scan, there is mild posterior subluxation of C2 compared to the lateral masses of C1. This could be acute or chronic. SPINAL CORD: Abnormal T2 and STIR signal intensity in the left side of the cervical cord at C2 at the level of the patient's fracture likely an old injury with myelomalacia but an acute cord contusion cannot be completely excluded. No evidence of hemorrhage but the gradient echo sequence is quite limited. SOFT TISSUES: No paraspinal mass. No obvious hematoma. I don't see any definite surrounding fluid collections or edema to suggest an acute ligamentous injury, but the study is limited. NO DISC PROTRUSIONS, SPINAL OR FORAMINAL STENOSIS. IMPRESSION: 1. Remote ununited displaced type 2 dens fracture with mild posterior subluxation of C2 relative to the lateral masses of C1, indeterminate acuity given motion-limited study without definitive edema or fluid collection to suggest acute ligamentous injury. 2. No acute cervical spine fracture identified. 3. 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 Could not totally exclude the possibility of an acute contusion. Electronically signed by: Maude Stammer MD 03/16/2024 09:48 PM EST RP Workstation: [id number]   CT Cervical Spine Wo Contrast Result Date: 03/16/2024 EXAM: CT CERVICAL SPINE WITHOUT CONTRAST 03/16/2024 07:47:29 PM TECHNIQUE: CT of the cervical spine was performed without the administration  of intravenous contrast. Multiplanar reformatted images are provided for review. Automated exposure control, iterative reconstruction, and/or weight based adjustment of the mA/kV was utilized to reduce the radiation dose to as low as reasonably achievable. COMPARISON: ct c spine 9.6.23 CLINICAL HISTORY: Neck trauma (Age >= 65y)  FINDINGS: CERVICAL SPINE: BONES AND ALIGNMENT: Chronic non-fused healed type 2 dens fracture with interval increased subluxation of the dens anteriorly in relation to the remainder of the C2 vertebral body (6 mm compared to prior 4 mm). Only partial articulation of the C1 lateral masses and C2 articular facets (6:20,36). No acute fracture or traumatic malalignment. DEGENERATIVE CHANGES: Interval stenosis and mass effect on the central canal at the C1-C2 level (6:28). SOFT TISSUES: Atherosclerotic plaque in the carotid arteries within the neck. No prevertebral soft tissue swelling. Recommend further evaluation with MRI cervical spine noncontrast. IMPRESSION: 1. 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 Recommend C-collar placement and noncontrast MRI of the cervical spine for further evaluation. 2. No acute fracture. 3. These findings were discussed with Dr. Pamella over the phone with Dr. Margarite on 04/12/2024 at 08:09 pm . Electronically signed by: Kate Margarite MD 03/16/2024 08:11 PM EST RP Workstation: [id number]   DG Hip Unilat W or Wo Pelvis 2-3 Views Left Result Date: 03/16/2024 EXAM: 2 or 3 VIEW(S) XRAY OF THE LEFT HIP 03/16/2024 07:55:00 PM COMPARISON: CT abdomen and pelvis 11/20/22 CLINICAL HISTORY: L hip pain s/p fall FINDINGS: BONES AND JOINTS: Left inferior pubic ramus lucency suggestive of underlying fracture. The hip joint is maintained. No significant degenerative changes. Grossly unremarkable sacrum -limited evaluation due to overlapping osseous structures and overlying soft tissues. SOFT TISSUES: Severe atherosclerotic plaque. Coarsened calcifications overlying the pelvis consistent with known fibroids. IMPRESSION: 1. Left inferior pubic ramus lucency suspicious for fracture. Electronically signed by: Morgane Naveau MD 03/16/2024 08:01 PM EST RP Workstation: [id number]   DG Knee 2 Views Left Result Date: 03/16/2024 EXAM: 1 or 2 VIEW(S) XRAY OF  THE LEFT KNEE 03/16/2024 07:55:00 PM COMPARISON: None available. CLINICAL HISTORY: L hip pain s/p fall FINDINGS: BONES AND JOINTS: No acute fracture. No focal osseous lesion. No joint dislocation. No significant joint effusion. No significant degenerative changes. SOFT TISSUES: The soft tissues are unremarkable. VASCULATURE: Severe atherosclerotic plaque. IMPRESSION: 1. No acute fracture or dislocation. 2. Severe atherosclerotic plaque. Electronically signed by: Morgane Naveau MD 03/16/2024 07:59 PM EST RP Workstation: [id number]   CT Head Wo Contrast Result Date: 03/16/2024 EXAM: CT HEAD WITHOUT CONTRAST 03/16/2024 07:47:29 PM TECHNIQUE: CT of the head was performed without the administration of intravenous contrast. Automated exposure control, iterative reconstruction, and/or weight based adjustment of the mA/kV was utilized to reduce the radiation dose to as low as reasonably achievable. COMPARISON: CT head 01/17/22 CLINICAL HISTORY: Head trauma, minor (Age >= 65y). FINDINGS: BRAIN AND VENTRICLES: Diffuse cerebral and cerebellar atrophy. Patchy and confluent areas of decreased attenuation are noted throughout the deep and periventricular white matter of the cerebral hemispheres bilaterally, suggestive of chronic microvascular ischemic changes. Atherosclerotic calcifications are present within the cavernous internal carotid and vertebral arteries. No acute hemorrhage. No evidence of acute infarct. No hydrocephalus. No extra-axial collection. No mass effect or midline shift. ORBITS: No acute abnormality. SINUSES: Paranasal sinuses are clear. SOFT TISSUES AND SKULL: Partial right mastoid air cell effusion. No fluid within the right middle ear. Partial left mastoid air cell effusion. No fluid within the left middle ear. No acute soft tissue abnormality. No skull fracture. IMPRESSION: 1. No acute  intracranial abnormality. 2. Partial right and left mastoid air cell effusions. Electronically signed by: Morgane Naveau  MD 03/16/2024 07:58 PM EST RP Workstation: [id number]   DG Pelvis Portable Result Date: 03/16/2024 CLINICAL DATA:  Trauma EXAM: PORTABLE PELVIS 1-2 VIEWS COMPARISON:  None Available. FINDINGS: Osteopenia.No evidence of pelvic bone diastasis.Subtle lucency through the inferior pubic ramus on the left. Subtle cortical irregularity may also be present along the superior pubic ramus on the left.There is no evidence of arthropathy or other focal bone abnormality. Diffuse aortoiliac atherosclerosis with extensive peripheral vascular atherosclerosis. IMPRESSION: Osteopenia. Findings worrisome for left inferior and superior pubic rami fractures. Electronically Signed   By: Rogelia Myers M.D.   On: 03/16/2024 19:32   DG Chest Port 1 View Result Date: 03/16/2024 CLINICAL DATA:  Trauma , fall EXAM: PORTABLE CHEST - 1 VIEW COMPARISON:  Oct 02, 2022, Oct 01, 2022 FINDINGS: Chronic coarsening of the pulmonary interstitium with streaky opacities in the left mid and lower lung zones, likely emphysema with atelectasis or scarring, unchanged. No new airspace consolidation, pleural effusion, or pneumothorax. Similar elevation of the left hemidiaphragm. Mild cardiomegaly. Interval decrease in size of the pericardial effusion. Tortuous aorta with aortic atherosclerosis. No acute fracture or destructive lesions. Multilevel thoracic osteophytosis. Likely loose body in the left shoulder joint. Diffuse osteopenia. IMPRESSION: No acute cardiopulmonary abnormality. Electronically Signed   By: Rogelia Myers M.D.   On: 03/16/2024 19:28    Review of Systems  Constitutional: Negative.   HENT: Negative.    Eyes: Negative.   Respiratory: Negative.    Cardiovascular: Negative.   Gastrointestinal: Negative.   Genitourinary: Negative.   Musculoskeletal:  Positive for falls.  Skin: Negative.   Neurological:  Positive for tingling.  Endo/Heme/Allergies: Negative.   Psychiatric/Behavioral: Negative.     Blood pressure (!)  146/96, pulse 81, temperature 97.6 F (36.4 C), temperature source Oral, resp. rate 14, SpO2 100%. Estimated body mass index is 25.39 kg/m as calculated from the following:   Height as of 02/25/24: 5' 5 (1.651 m).   Weight as of 11/05/23: 69.2 kg.  Physical Exam Constitutional:      Appearance: She is well-developed.     Interventions: Cervical collar in place.  HENT:     Head: Normocephalic.     Mouth/Throat:     Mouth: Mucous membranes are dry.  Eyes:     Extraocular Movements: Extraocular movements intact.     Pupils: Pupils are equal, round, and reactive to light.  Cardiovascular:     Rate and Rhythm: Normal rate and regular rhythm.     Pulses: Normal pulses.  Pulmonary:     Effort: Pulmonary effort is normal.     Breath sounds: Normal breath sounds.  Abdominal:     Palpations: Abdomen is soft.  Skin:    Capillary Refill: Capillary refill takes less than 2 seconds.  Neurological:     Mental Status: She is alert and oriented to person, place, and time.     GCS: GCS eye subscore is 4. GCS verbal subscore is 5. GCS motor subscore is 6.     Motor: Motor function is intact.     Coordination: Coordination is intact.     Comments: Reports tingling in the toes of her left foot and numbness in the index finger and thumb of her left hand.  Psychiatric:        Mood and Affect: Mood normal.        Behavior: Behavior normal.  Thought Content: Thought content normal.        Judgment: Judgment normal.     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 She appears relatively asymptomatic from neck injury. Recommend hard cervical collar at all times. She is fine to mobilize from our perspective with the hard cervical collar. She should follow up with Dr. Louis in the office in two weeks.  I am in communication with my attending and they agree with the plan for this patient.   Gerard Beck, DNP, AGNP-C Nurse Practitioner  [facility]  [facility] [address], [address], [place], [place] [address] P: [phone]    F: [phone]  03/17/2024, 9:17 AM

## 2024-03-17 NOTE — Consult Note (Addendum)
 [facility] Renal Consultation Note    Indication for Consultation:  Management of ESRD/hemodialysis, anemia, hypertension/volume, and secondary hyperparathyroidism. PCP:  HPI: Gloria Gallagher is a 73 y.o. female with significant PMH of ESRD, HFpEF, Afib (on Eliquis ), and CAD who was admitted on 11/03 after a fall.   Pt presented to the ED after a fall when reaching for a fly. Neurosurgery and Orthosurgery were consulted. Pt sustained a mild posterior C1-C2 subluxation and is currently in a C-collar. Pt also has a Left pubic rami fracture with WBAT. Admitted for PT and OT evaluation.   Pt seen in room resting comfortably. Pt was drowsy, but easily aroused. In pain with movement. No CP/dyspnea.    Pt currently receiving HD MWF at at [facility]. Last HD 03/16/2024.Uses L AVF without issues.   Past Medical History:  Diagnosis Date   Acute hypoxic respiratory failure (HCC) 10/01/2022   Acute respiratory failure (HCC) 10/01/2022   Anemia    Aortic atherosclerosis    Arthritis    Atrial fibrillation (HCC)    CAD (coronary artery disease)    COVID 2021   mild   ESRD on hemodialysis (HCC)    MWF at [facility]   GERD (gastroesophageal reflux disease)    Gout    Heart murmur    Hyperparathyroidism    Hypertension    in the past, no longer takes medications   Neuromuscular disorder (HCC)    neuropathy   Pneumonia    Seasonal allergies    Past Surgical History:  Procedure Laterality Date   ABDOMINAL AORTOGRAM N/A 08/22/2023   Procedure: ABDOMINAL AORTOGRAM;  Surgeon: Gretta Lonni PARAS, MD;  Location: [facility] INVASIVE CV LAB;  Service: Cardiovascular;  Laterality: N/A;   ABDOMINAL AORTOGRAM W/LOWER EXTREMITY N/A 12/13/2022   Procedure: ABDOMINAL AORTOGRAM W/LOWER EXTREMITY;  Surgeon: Gretta Lonni PARAS, MD;  Location: [facility] INVASIVE CV LAB;  Service: Cardiovascular;  Laterality: N/A;   CARDIOVERSION N/A 05/16/2021   Procedure: CARDIOVERSION;  Surgeon: Alveta Aleene PARAS, MD;   Location: [facility] ENDOSCOPY;  Service: Cardiovascular;  Laterality: N/A;   CARDIOVERSION N/A 08/01/2021   Procedure: CARDIOVERSION;  Surgeon: Francyne Headland, MD;  Location: [facility] ENDOSCOPY;  Service: Cardiovascular;  Laterality: N/A;   CARDIOVERSION N/A 12/05/2021   Procedure: CARDIOVERSION;  Surgeon: Sheena Pugh, DO;  Location: [facility] ENDOSCOPY;  Service: Cardiovascular;  Laterality: N/A;   DIALYSIS FISTULA CREATION Left    FEMORAL-POPLITEAL BYPASS GRAFT Left 09/25/2023   Procedure: BYPASS GRAFT FEMORAL-POPLITEAL ARTERY;  Surgeon: Magda Debby SAILOR, MD;  Location: [facility] OR;  Service: Vascular;  Laterality: Left;   LOWER EXTREMITY ANGIOGRAPHY N/A 08/22/2023   Procedure: Lower Extremity Angiography;  Surgeon: Gretta Lonni PARAS, MD;  Location: [facility] INVASIVE CV LAB;  Service: Cardiovascular;  Laterality: N/A;   LOWER EXTREMITY INTERVENTION N/A 08/22/2023   Procedure: LOWER EXTREMITY INTERVENTION;  Surgeon: Gretta Lonni PARAS, MD;  Location: [facility] INVASIVE CV LAB;  Service: Cardiovascular;  Laterality: N/A;   PERICARDIOCENTESIS N/A 10/02/2022   Procedure: PERICARDIOCENTESIS;  Surgeon: Verlin Lonni BIRCH, MD;  Location: [facility] INVASIVE CV LAB;  Service: Cardiovascular;  Laterality: N/A;   PERIPHERAL INTRAVASCULAR LITHOTRIPSY  08/22/2023   Procedure: PERIPHERAL INTRAVASCULAR LITHOTRIPSY;  Surgeon: Gretta Lonni PARAS, MD;  Location: [facility] INVASIVE CV LAB;  Service: Cardiovascular;;   REVISON OF ARTERIOVENOUS FISTULA Left 02/16/2022   Procedure: LEFT ARM ARTERIOVENOUS FISTULA REVISION;  Surgeon: Magda Debby SAILOR, MD;  Location: [facility] OR;  Service: Vascular;  Laterality: Left;  PERIPHERAL NERVE BLOCK   TUBAL LIGATION  Family History  Problem Relation Age of Onset   Diabetes Sister    Stomach cancer Neg Hx    Colon cancer Neg Hx    Social History:  reports that she has quit smoking. Her smoking use included cigarettes. She started smoking about 21 years ago. She has never been exposed to tobacco smoke. She has never  used smokeless tobacco. She reports that she does not currently use alcohol after a past usage of about 1.0 standard drink of alcohol per week. She reports that she does not use drugs.  ROS: As per HPI otherwise negative.  Physical Exam: Vitals:   03/17/24 0415 03/17/24 0630 03/17/24 0701 03/17/24 1144  BP: (!) 143/80 (!) 146/96  (!) 175/68  Pulse:  81  77  Resp: 11 14  14   Temp:   97.6 F (36.4 C) 97.6 F (36.4 C)  TempSrc:   Oral   SpO2:  100%  95%     General: Ill appearing female in NAD. Wearing C-Collar. Room air.  Head: Normocephalic, atraumatic, sclera non-icteric, mucus membranes are moist. Neck: Supple without lymphadenopathy/masses. JVD not elevated. Lungs: Clear bilaterally to auscultation without wheezes, rales, or rhonchi. Breathing is unlabored. Heart: RRR with normal S1, S2. No murmurs. Abdomen: Soft, non-tender, non-distended. Musculoskeletal: Bilateral lower extremities tender to palpation.  Lower extremities: Some non-pitting edema. No ischemic changes, no open wounds. Neuro: Alert and oriented X 3. Moves all extremities spontaneously. Psych:  Responds to questions appropriately with a normal affect. Dialysis Access: Left AVF +t/b  Allergies  Allergen Reactions   Penicillin G Hives and Itching   Shrimp (Diagnostic) Hives   Prior to Admission medications   Medication Sig Start Date End Date Taking? Authorizing Provider  acetaminophen -codeine (TYLENOL  #2) 300-15 MG tablet Take 1 tablet by mouth every 4 (four) hours as needed for moderate pain (pain score 4-6).    [provider]  amiodarone  (PACERONE ) 200 MG tablet TAKE 1 TABLET BY MOUTH DAILY 11/14/23   Nahser, Aleene PARAS, MD  apixaban  (ELIQUIS ) 5 MG TABS tablet TAKE 1 TABLET BY MOUTH TWICE  DAILY 11/14/23   Nahser, Aleene PARAS, MD  aspirin  EC 81 MG tablet Take 1 tablet (81 mg total) by mouth daily. Swallow whole. 09/30/23   Baglia, Corrina, PA-C  atorvastatin  (LIPITOR) 20 MG tablet Take 20 mg by mouth every  evening. 12/05/21   [provider]  cilostazol  (PLETAL ) 50 MG tablet Take 1 tablet (50 mg total) by mouth 2 (two) times daily. 07/02/23   Gerome Maurilio HERO, PA-C  cinacalcet  (SENSIPAR ) 60 MG tablet Take 60 mg by mouth at bedtime.    [provider]  clopidogrel  (PLAVIX ) 75 MG tablet Take 1 tablet (75 mg total) by mouth daily. Patient taking differently: Take 75 mg by mouth every evening. 08/22/23 08/21/24  Gretta Lonni PARAS, MD  doxycycline  (VIBRAMYCIN ) 50 MG capsule Take 100 mg by mouth 2 (two) times daily.    [provider]  ferric citrate  (AURYXIA ) 1 GM 210 MG(Fe) tablet Take 1 tablet (210 mg total) by mouth 3 (three) times daily with meals. Patient taking differently: Take 630 mg by mouth 3 (three) times daily with meals. 01/19/22   Bryan Bianchi, MD  HYDROcodone -acetaminophen  (NORCO/VICODIN) 5-325 MG tablet Take 1 tablet by mouth every 6 (six) hours as needed for severe pain (pain score 7-10). 09/30/23   Baglia, Corrina, PA-C  multivitamin (RENA-VIT) TABS tablet Take 1 tablet by mouth at bedtime. 10/05/22   Bryan Bianchi, MD   Current Facility-Administered  Medications  Medication Dose Route Frequency Provider Last Rate Last Admin   acetaminophen  (TYLENOL ) tablet 500 mg  500 mg Oral Q6H PRN Shona Laurence N, DO       amiodarone  (PACERONE ) tablet 200 mg  200 mg Oral Daily Hall, Carole N, DO       atorvastatin  (LIPITOR) tablet 20 mg  20 mg Oral QPM Hall, Carole N, DO       [START ON 03/18/2024] Chlorhexidine  Gluconate Cloth 2 % PADS 6 each  6 each Topical Q0600 Broadus Lamarr SAUNDERS, PA-C       HYDROmorphone  (DILAUDID ) injection 0.5 mg  0.5 mg Intravenous Q3H PRN Shona Laurence N, DO   0.5 mg at 03/17/24 0804   oxyCODONE  (Oxy IR/ROXICODONE ) immediate release tablet 5 mg  5 mg Oral Q6H PRN Shona Laurence N, DO       prochlorperazine (COMPAZINE) injection 5 mg  5 mg Intravenous Q6H PRN Shona Laurence N, DO   5 mg at 03/17/24 9161   Labs: Basic Metabolic Panel: Recent Labs  Lab  03/16/24 1919 03/16/24 1955 03/17/24 0227  NA 139 137 136  K 4.2 4.1 4.0  CL 95* 99 95*  CO2 28  --  28  GLUCOSE 101* 100* 103*  BUN 24* 27* 28*  CREATININE 5.77* 5.80* 6.13*  CALCIUM  10.2  --  9.4  PHOS  --   --  4.6   Liver Function Tests: Recent Labs  Lab 03/16/24 1919 03/17/24 0227  AST 22  --   ALT 14  --   ALKPHOS 170*  --   BILITOT 0.8  --   PROT 7.5  --   ALBUMIN  3.6 3.4*    CBC: Recent Labs  Lab 03/16/24 1919 03/16/24 1955 03/17/24 0227  WBC 4.3  --  4.7  HGB 12.9 14.3 11.3*  HCT 41.4 42.0 35.5*  MCV 96.3  --  95.9  PLT 149*  --  122*    Studies/Results: CT CHEST ABDOMEN PELVIS W CONTRAST Result Date: 03/16/2024 EXAM: CT CHEST, ABDOMEN AND PELVIS WITH CONTRAST 03/16/2024 10:32:39 PM TECHNIQUE: CT of the chest, abdomen and pelvis was performed with the administration of 75 mL of iohexol  (OMNIPAQUE ) 350 MG/ML injection. Multiplanar reformatted images are provided for review. Automated exposure control, iterative reconstruction, and/or weight based adjustment of the mA/kV was utilized to reduce the radiation dose to as low as reasonably achievable. COMPARISON: 11/20/2022 CLINICAL HISTORY: fall on thinners FINDINGS: CHEST: MEDIASTINUM AND LYMPH NODES: Marked cardiomegaly coronary artery and aortic atherosclerosis. Marked dilatation of the main pulmonary artery measuring 6.4 cm. The central airways are clear. No mediastinal, hilar or axillary lymphadenopathy. LUNGS AND PLEURA: Moderate emphysema. An 11 mm right lower lobe pulmonary nodule is stable. Left base atelectasis. No focal consolidation or pulmonary edema. No pleural effusion or pneumothorax. ABDOMEN AND PELVIS: LIVER: Innumerable cysts throughout the liver unchanged since prior study. GALLBLADDER AND BILE DUCTS: Gallbladder is unremarkable. No biliary ductal dilatation. SPLEEN: No acute abnormality. PANCREAS: No acute abnormality. ADRENAL GLANDS: No acute abnormality. KIDNEYS, URETERS AND BLADDER: Innumerable  cysts replace much of the renal parenchyma bilaterally, compatible with polycystic kidney disease, unchanged. No stones in the kidneys or ureters. No hydronephrosis. No perinephric or periureteral stranding. Urinary bladder decompressed, grossly unremarkable. GI AND BOWEL: Stomach demonstrates no acute abnormality. Diffuse colonic diverticulosis. There is no bowel obstruction. REPRODUCTIVE ORGANS: Calcified fibroids in the uterus. PERITONEUM AND RETROPERITONEUM: No ascites. No free air. VASCULATURE: Aorta is normal in caliber with aortic and aortoiliac atherosclerosis. ABDOMINAL AND  PELVIS LYMPH NODES: No lymphadenopathy. BONES AND SOFT TISSUES: No acute osseous abnormality. No focal soft tissue abnormality. IMPRESSION: 1. No evidence of acute traumatic injury. 2. Marked dilatation of the main pulmonary artery measuring 6.4 cm, which may reflect pulmonary hypertension. 3. Stable 11 mm right lower lobe pulmonary nodule. This could be followed with repeat CT in 1 year to ensure 2 years stability. 4. Marked cardiomegaly. 5. Emphysema. 6. Polycystic kidney disease. 7. Diffuse colonic diverticulosis. Electronically signed by: Franky Crease MD 03/16/2024 11:06 PM EST RP Workstation: [id number]   MR Cervical Spine Wo Contrast Result Date: 03/16/2024 EXAM: MRI CERVICAL SPINE WITH AND WITHOUT CONTRAST 03/16/2024 09:13:37 PM TECHNIQUE: Multiplanar multisequence MRI of the cervical spine was performed without and with the administration of intravenous contrast. COMPARISON: CT cervical spine 03/16/2021 and 01/17/2022. CLINICAL HISTORY: Neck trauma, ligament injury suspected (Age >= 16y); ?AA subluxation s/p fall. FINDINGS: LIMITATIONS: Examination limited by motion artifact. BONES AND ALIGNMENT: Remote ununited displaced type 2 dens fracture. No acute findings of cervical spine fracture is identified. Other than the malalignment of the fracture, the cervical vertebral bodies are normally aligned. As demonstrated on the CT  scan, there is mild posterior subluxation of C2 compared to the lateral masses of C1. This could be acute or chronic. SPINAL CORD: Abnormal T2 and STIR signal intensity in the left side of the cervical cord at C2 at the level of the patient's fracture likely an old injury with myelomalacia but an acute cord contusion cannot be completely excluded. No evidence of hemorrhage but the gradient echo sequence is quite limited. SOFT TISSUES: No paraspinal mass. No obvious hematoma. I don't see any definite surrounding fluid collections or edema to suggest an acute ligamentous injury, but the study is limited. NO DISC PROTRUSIONS, SPINAL OR FORAMINAL STENOSIS. IMPRESSION: 1. Remote ununited displaced type 2 dens fracture with mild posterior subluxation of C2 relative to the lateral masses of C1, indeterminate acuity given motion-limited study without definitive edema or fluid collection to suggest acute ligamentous injury. 2. No acute cervical spine fracture identified. 3. 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 Could not totally exclude the possibility of an acute contusion. Electronically signed by: Maude Stammer MD 03/16/2024 09:48 PM EST RP Workstation: [id number]   CT Cervical Spine Wo Contrast Result Date: 03/16/2024 EXAM: CT CERVICAL SPINE WITHOUT CONTRAST 03/16/2024 07:47:29 PM TECHNIQUE: CT of the cervical spine was performed without the administration of intravenous contrast. Multiplanar reformatted images are provided for review. Automated exposure control, iterative reconstruction, and/or weight based adjustment of the mA/kV was utilized to reduce the radiation dose to as low as reasonably achievable. COMPARISON: ct c spine 9.6.23 CLINICAL HISTORY: Neck trauma (Age >= 65y) FINDINGS: CERVICAL SPINE: BONES AND ALIGNMENT: Chronic non-fused healed type 2 dens fracture with interval increased subluxation of the dens  anteriorly in relation to the remainder of the C2 vertebral body (6 mm compared to prior 4 mm). Only partial articulation of the C1 lateral masses and C2 articular facets (6:20,36). No acute fracture or traumatic malalignment. DEGENERATIVE CHANGES: Interval stenosis and mass effect on the central canal at the C1-C2 level (6:28). SOFT TISSUES: Atherosclerotic plaque in the carotid arteries within the neck. No prevertebral soft tissue swelling. Recommend further evaluation with MRI cervical spine noncontrast. IMPRESSION: 1. 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 Recommend C-collar placement and noncontrast MRI of the cervical spine for further evaluation.  2. No acute fracture. 3. These findings were discussed with Dr. Pamella over the phone with Dr. Margarite on 04/12/2024 at 08:09 pm . Electronically signed by: Kate Margarite MD 03/16/2024 08:11 PM EST RP Workstation: [id number]   DG Hip Unilat W or Wo Pelvis 2-3 Views Left Result Date: 03/16/2024 EXAM: 2 or 3 VIEW(S) XRAY OF THE LEFT HIP 03/16/2024 07:55:00 PM COMPARISON: CT abdomen and pelvis 11/20/22 CLINICAL HISTORY: L hip pain s/p fall FINDINGS: BONES AND JOINTS: Left inferior pubic ramus lucency suggestive of underlying fracture. The hip joint is maintained. No significant degenerative changes. Grossly unremarkable sacrum -limited evaluation due to overlapping osseous structures and overlying soft tissues. SOFT TISSUES: Severe atherosclerotic plaque. Coarsened calcifications overlying the pelvis consistent with known fibroids. IMPRESSION: 1. Left inferior pubic ramus lucency suspicious for fracture. Electronically signed by: Morgane Naveau MD 03/16/2024 08:01 PM EST RP Workstation: [id number]   DG Knee 2 Views Left Result Date: 03/16/2024 EXAM: 1 or 2 VIEW(S) XRAY OF THE LEFT KNEE 03/16/2024 07:55:00 PM COMPARISON: None available. CLINICAL HISTORY: L hip pain s/p fall FINDINGS: BONES AND JOINTS: No acute  fracture. No focal osseous lesion. No joint dislocation. No significant joint effusion. No significant degenerative changes. SOFT TISSUES: The soft tissues are unremarkable. VASCULATURE: Severe atherosclerotic plaque. IMPRESSION: 1. No acute fracture or dislocation. 2. Severe atherosclerotic plaque. Electronically signed by: Morgane Naveau MD 03/16/2024 07:59 PM EST RP Workstation: [id number]   CT Head Wo Contrast Result Date: 03/16/2024 EXAM: CT HEAD WITHOUT CONTRAST 03/16/2024 07:47:29 PM TECHNIQUE: CT of the head was performed without the administration of intravenous contrast. Automated exposure control, iterative reconstruction, and/or weight based adjustment of the mA/kV was utilized to reduce the radiation dose to as low as reasonably achievable. COMPARISON: CT head 01/17/22 CLINICAL HISTORY: Head trauma, minor (Age >= 65y). FINDINGS: BRAIN AND VENTRICLES: Diffuse cerebral and cerebellar atrophy. Patchy and confluent areas of decreased attenuation are noted throughout the deep and periventricular white matter of the cerebral hemispheres bilaterally, suggestive of chronic microvascular ischemic changes. Atherosclerotic calcifications are present within the cavernous internal carotid and vertebral arteries. No acute hemorrhage. No evidence of acute infarct. No hydrocephalus. No extra-axial collection. No mass effect or midline shift. ORBITS: No acute abnormality. SINUSES: Paranasal sinuses are clear. SOFT TISSUES AND SKULL: Partial right mastoid air cell effusion. No fluid within the right middle ear. Partial left mastoid air cell effusion. No fluid within the left middle ear. No acute soft tissue abnormality. No skull fracture. IMPRESSION: 1. No acute intracranial abnormality. 2. Partial right and left mastoid air cell effusions. Electronically signed by: Morgane Naveau MD 03/16/2024 07:58 PM EST RP Workstation: [id number]   DG Pelvis Portable Result Date: 03/16/2024 CLINICAL DATA:  Trauma EXAM: PORTABLE  PELVIS 1-2 VIEWS COMPARISON:  None Available. FINDINGS: Osteopenia.No evidence of pelvic bone diastasis.Subtle lucency through the inferior pubic ramus on the left. Subtle cortical irregularity may also be present along the superior pubic ramus on the left.There is no evidence of arthropathy or other focal bone abnormality. Diffuse aortoiliac atherosclerosis with extensive peripheral vascular atherosclerosis. IMPRESSION: Osteopenia. Findings worrisome for left inferior and superior pubic rami fractures. Electronically Signed   By: Rogelia Myers M.D.   On: 03/16/2024 19:32   DG Chest Port 1 View Result Date: 03/16/2024 CLINICAL DATA:  Trauma , fall EXAM: PORTABLE CHEST - 1 VIEW COMPARISON:  Oct 02, 2022, Oct 01, 2022 FINDINGS: Chronic coarsening of the pulmonary interstitium with streaky opacities in the left mid and lower lung zones, likely emphysema with atelectasis  or scarring, unchanged. No new airspace consolidation, pleural effusion, or pneumothorax. Similar elevation of the left hemidiaphragm. Mild cardiomegaly. Interval decrease in size of the pericardial effusion. Tortuous aorta with aortic atherosclerosis. No acute fracture or destructive lesions. Multilevel thoracic osteophytosis. Likely loose body in the left shoulder joint. Diffuse osteopenia. IMPRESSION: No acute cardiopulmonary abnormality. Electronically Signed   By: Rogelia Myers M.D.   On: 03/16/2024 19:28    Dialysis Orders:  MWF, [place] -3.45, 400/A1.15, EDW: 66.4, 2K/2Ca bath, No heparin  -Hectoral 4mcg IV qHD  Assessment/Plan:  C1-C2 Subloxation: Neurosurgery consulted and conducted CT head. Recommened to  continue to wear C-collar, non-operative. No acute ischemic changes. Left Pubic Rami fracture: Followed by ortho surgery. No acute surgical needs. WBAC LLE. Continue to follow with PT/OT.   ESRD:  Continue MWF dialysis treatment. Will receive dialysis tomorrow.   Hypertension/volume: BP elevated today. Will continue at home  medications and continue to follow after dialysis tomorrow.   Anemia: Hgb 11.3. Not currently on ESA.   Metabolic bone disease: Ca and Phos good. Hectoral qHD  Nutrition:  Albumin  low. Will order supps.   Gerard Banks, PA-S2. 03/17/2024, 12:08 PM  [facility]   Pt seen and examined by myself in conjunction with the PA student above. I agree with the following plan. Izetta Boehringer, PA-C [employer] Pager [phone]    Seen and examined independently.  Agree with note and exam as documented above by physician extender and as noted here.  Gloria Gallagher is a pleasant female with a history of ESRD on hemodialysis Monday Wednesday Friday who presented to the hospital after a fall.  Patient with left inferior and superior pubic rami fractures.  She is in a C-collar as well.  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  They recommended a c-collar at all times and follow-up in the office in 2 weeks.  PT and OT have been consulted she is on dialysis Monday Wednesday Friday at [place] did have treatment on Monday.  Feels well usually.  She was reaching for a fly when she fell.  She's on 3 liters oxygen  here and not on oxygen  at home.   General adult female in bed in no acute distress  HEENT normocephalic atraumatic extraocular movements intact sclera anicteric Neck supple trachea midline in c-collar Lungs clear to auscultation bilaterally normal work of breathing at rest on 3 liters; wet cough Heart regular rate and rhythm no rubs or gallops appreciated Abdomen soft nontender nondistended Extremities trace edema Psych normal mood and affect Neuro - sleepy but oriented x 3 Access LUE AVF with bruit and thrill   # Fall with Left pubic rami fracture - Per orthopedic surgery - it doesn't appear that she was dizzy prior to her fall  # ESRD - HD per Monday Wednesday Friday schedule - note findings of possible  pulm HTN - UF as tolerated with HD  # Hypertension - Optimize volume status with HD   #Anemia of CKD No ESA indicated  # Metabolic bone disease Continue Hectorol  note that she is also on Sensipar .  On Auryxia  for hyperphosphatemia. Phos acceptable   Thank you for the consult.  Please do not hesitate to contact us  with any questions regarding her patient   Katheryn JAYSON Saba, MD 03/17/2024  6:32 PM

## 2024-03-17 NOTE — ED Notes (Signed)
 Neurosurgery at the bedside.

## 2024-03-17 NOTE — Evaluation (Signed)
 Physical Therapy Evaluation Patient Details Name: Gloria Gallagher MRN: [id number] DOB: [birth year] Today's Date: 03/17/2024  History of Present Illness  The pt is a 73 yo female presenting 11/3 after a fall on blood thinners. CT showed C1-2 subluxation, neurosurgery consulted, and L-sided pubic rami fx. PMH includes: anemia, arthritis, afib, CAD, ESRD on HD MWF, and neuropathy.  Clinical Impression  Pt in bed upon arrival of PT, agreeable to evaluation at this time after premedication with pain and nausea medications. Prior to admission the pt was independent with use of cane, reports 2 other falls in last 6 months. Pt reports independent with mobility in the home, leaving house for HD treatments, otherwise largely sedentary. The pt required minA to complete bed mobility this morning due to pain in L hip, and then was limited in further OOB transfers due to onset of nausea and vomiting. Pt returned to supine with assist to manage LE, will benefit from continued skilled PT acutely to progress functional independence with transfers and evaluate gait prior to anticipated return home with family assist.      If plan is discharge home, recommend the following: A little help with walking and/or transfers;A little help with bathing/dressing/bathroom;Assistance with cooking/housework;Help with stairs or ramp for entrance   Can travel by private vehicle        Equipment Recommendations None recommended by PT  Recommendations for Other Services       Functional Status Assessment Patient has had a recent decline in their functional status and demonstrates the ability to make significant improvements in function in a reasonable and predictable amount of time.     Precautions / Restrictions Precautions Precautions: Fall;Cervical Precaution Booklet Issued: Yes (comment) Recall of Precautions/Restrictions: Intact Required Braces or Orthoses: Cervical Brace Cervical Brace: Hard collar;At all  times Restrictions Weight Bearing Restrictions Per Provider Order: Yes LLE Weight Bearing Per Provider Order: Weight bearing as tolerated      Mobility  Bed Mobility Overal bed mobility: Needs Assistance Bed Mobility: Supine to Sit     Supine to sit: HOB elevated, Min assist     General bed mobility comments: HOB elevated significantly to chair-like position and pt able to pivot to EOB with minA to manage  LLE. modA to return LE to bed    Transfers                   General transfer comment: unable to attempt due to onset of vomiting sitting EOB, pt unable to manage scooting to EOB.       Balance Overall balance assessment: Needs assistance Sitting-balance support: No upper extremity supported, Feet supported Sitting balance-Leahy Scale: Good Sitting balance - Comments: good sitting balance at EOB,no UE support or external support even with vomiting                                     Pertinent Vitals/Pain Pain Assessment Pain Assessment: No/denies pain    Home Living Family/patient expects to be discharged to:: Private residence Living Arrangements: Other relatives (neice) Available Help at Discharge: Family;Available PRN/intermittently Type of Home: House Home Access: Level entry       Home Layout: Two level;Able to live on main level with bedroom/bathroom Home Equipment: Rolling Walker (2 wheels);Cane - single point;Shower seat;BSC/3in1;Hand held shower head      Prior Function Prior Level of Function : Needs assist  Mobility Comments: walks with a cane ADLs Comments: reports independent but notes from last admission in may states sister assists with ADLs     Extremity/Trunk Assessment   Upper Extremity Assessment Upper Extremity Assessment: Defer to OT evaluation    Lower Extremity Assessment Lower Extremity Assessment: LLE deficits/detail LLE Deficits / Details: limited by pain at hip with AROM, tolerates  small ROM hip flexion and ABD/ADD. hx of neuropathy LLE: Unable to fully assess due to pain LLE Sensation: history of peripheral neuropathy LLE Coordination: WNL    Cervical / Trunk Assessment Cervical / Trunk Assessment: Other exceptions Cervical / Trunk Exceptions: educated on cervical precautions for comfort, hard collar on  Communication   Communication Communication: No apparent difficulties    Cognition Arousal: Lethargic Behavior During Therapy: Flat affect   PT - Cognitive impairments: Difficult to assess Difficult to assess due to: Level of arousal (and nausea)                     PT - Cognition Comments: pt with flat affect and answering questions consistently, but limited engagement due to lethargy and nausea. not formally assessed Following commands: Intact       Cueing Cueing Techniques: Verbal cues     General Comments General comments (skin integrity, edema, etc.): VSS on 4L, weaned to 2L with SpO2 96%        Assessment/Plan    PT Assessment Patient needs continued PT services  PT Problem List Decreased strength;Decreased range of motion;Decreased activity tolerance;Decreased balance;Decreased mobility;Decreased safety awareness;Pain       PT Treatment Interventions DME instruction;Gait training;Stair training;Functional mobility training;Therapeutic activities;Therapeutic exercise;Balance training;Patient/family education    PT Goals (Current goals can be found in the Care Plan section)  Acute Rehab PT Goals Patient Stated Goal: to reduce pain PT Goal Formulation: With patient Time For Goal Achievement: 03/31/24 Potential to Achieve Goals: Good    Frequency Min 2X/week     Co-evaluation               AM-PAC PT 6 Clicks Mobility  Outcome Measure Help needed turning from your back to your side while in a flat bed without using bedrails?: A Little Help needed moving from lying on your back to sitting on the side of a flat bed  without using bedrails?: A Little Help needed moving to and from a bed to a chair (including a wheelchair)?: A Lot Help needed standing up from a chair using your arms (e.g., wheelchair or bedside chair)?: A Lot Help needed to walk in hospital room?: Total Help needed climbing 3-5 steps with a railing? : Total 6 Click Score: 12    End of Session Equipment Utilized During Treatment: Cervical collar Activity Tolerance: Other (comment) (vomiting) Patient left: in bed;with call bell/phone within reach;with family/visitor present Nurse Communication: Mobility status;Other (comment) (vomiting) PT Visit Diagnosis: Unsteadiness on feet (R26.81);Other abnormalities of gait and mobility (R26.89);Muscle weakness (generalized) (M62.81);History of falling (Z91.81);Pain Pain - Right/Left: Left Pain - part of body: Hip    Time: 0921-0948 PT Time Calculation (min) (ACUTE ONLY): 27 min   Charges:   PT Evaluation $PT Eval Moderate Complexity: 1 Mod PT Treatments $Therapeutic Activity: 8-22 mins PT General Charges $$ ACUTE PT VISIT: 1 Visit         Izetta Call, PT, DPT   Acute Rehabilitation Department Office [phone] Secure Chat Communication Preferred  Izetta JULIANNA Call 03/17/2024, 12:34 PM

## 2024-03-17 NOTE — Progress Notes (Signed)
 Pt receives out-pt HD at [facility], [place] [address] chair time. Will continue to assist as needed.   Lavanda Marquelle Musgrave Dialysis Navigator [phone]

## 2024-03-18 DIAGNOSIS — W19XXXA Unspecified fall, initial encounter: Secondary | ICD-10-CM | POA: Diagnosis not present

## 2024-03-18 DIAGNOSIS — N186 End stage renal disease: Secondary | ICD-10-CM | POA: Diagnosis not present

## 2024-03-18 LAB — HEPATITIS B SURFACE ANTIBODY, QUANTITATIVE: Hep B S AB Quant (Post): 32.1 m[IU]/mL

## 2024-03-18 LAB — BASIC METABOLIC PANEL WITH GFR
Anion gap: 18 — ABNORMAL HIGH (ref 5–15)
BUN: 38 mg/dL — ABNORMAL HIGH (ref 8–23)
CO2: 28 mmol/L (ref 22–32)
Calcium: 8.5 mg/dL — ABNORMAL LOW (ref 8.9–10.3)
Chloride: 91 mmol/L — ABNORMAL LOW (ref 98–111)
Creatinine, Ser: 8.24 mg/dL — ABNORMAL HIGH (ref 0.44–1.00)
GFR, Estimated: 5 mL/min — ABNORMAL LOW (ref >60)
Glucose, Bld: 99 mg/dL (ref 70–99)
Potassium: 4.2 mmol/L (ref 3.5–5.1)
Sodium: 137 mmol/L (ref 135–145)

## 2024-03-18 LAB — CBC
HCT: 35.8 % — ABNORMAL LOW (ref 36.0–46.0)
Hemoglobin: 11.4 g/dL — ABNORMAL LOW (ref 12.0–15.0)
MCH: 30.3 pg (ref 26.0–34.0)
MCHC: 31.8 g/dL (ref 30.0–36.0)
MCV: 95.2 fL (ref 80.0–100.0)
Platelets: 126 K/uL — ABNORMAL LOW (ref 150–400)
RBC: 3.76 MIL/uL — ABNORMAL LOW (ref 3.87–5.11)
RDW: 15.4 % (ref 11.5–15.5)
WBC: 3.5 K/uL — ABNORMAL LOW (ref 4.0–10.5)
nRBC: 0 % (ref 0.0–0.2)

## 2024-03-18 MED ORDER — ALTEPLASE 2 MG IJ SOLR: 2.0000 mg | Freq: Once | INTRAMUSCULAR | Status: DC | PRN

## 2024-03-18 MED ORDER — ANTICOAGULANT SODIUM CITRATE 4% (200MG/5ML) IV SOLN: 5.0000 mL | Status: DC | PRN

## 2024-03-18 MED ORDER — LIDOCAINE-PRILOCAINE 2.5-2.5 % EX CREA: 1.0000 | TOPICAL_CREAM | CUTANEOUS | Status: DC | PRN

## 2024-03-18 MED ORDER — HEPARIN SODIUM (PORCINE) 1000 UNIT/ML DIALYSIS
1000.0000 [IU] | INTRAMUSCULAR | Status: DC | PRN
Start: 2024-03-18 — End: 2024-03-18

## 2024-03-18 MED ORDER — LIDOCAINE HCL (PF) 1 % IJ SOLN: 5.0000 mL | INTRAMUSCULAR | Status: DC | PRN

## 2024-03-18 MED ORDER — PENTAFLUOROPROP-TETRAFLUOROETH EX AERO: 1.0000 | INHALATION_SPRAY | CUTANEOUS | Status: DC | PRN

## 2024-03-18 NOTE — Progress Notes (Signed)
 Patient currently off floor and unavailable.  Patient with known type II odontoid fracture which she is declined treatment on in the past.  Patient with recent fall and worsening angulation and anterior translation of her odontoid fracture.  Patient with some early signal change in her upper cervical cord.  This is an unstable fracture and does put her at risk for catastrophic neurologic injury.  Patient is in poor general health and does not wish to consider surgery however.  I will circle back tomorrow and discuss things with her further but at this point recommend bracing and activity as tolerated.

## 2024-03-18 NOTE — Progress Notes (Signed)
 [facility] Progress Note  Name: Gloria Gallagher MRN: [id number] DOB: 04/14/1951  Chief Complaint:  Fall with fracture  Subjective:  Seen and examined on dialysis.  Procedure supervised.  Blood pressure 130/70 and HR 88.  Tolerating goal.  She had one unmeasured urine void over 11/4. LUE AVF in use.  Asked if she had discussed plans for dispo with her team - she states that she hasn't but that her wish would be to go home if possible.    Review of systems:  She denies shortness of breath or chest pain  Denies n/v   Intake/Output Summary (Last 24 hours) at 03/18/2024 1043 Last data filed at 03/17/2024 1300 Gross per 24 hour  Intake 120 ml  Output --  Net 120 ml    Vitals:  Vitals:   03/18/24 0817 03/18/24 0830 03/18/24 0900 03/18/24 0930  BP: (!) 144/67 (!) 149/74 (!) 141/70 129/85  Pulse: 91 83 79 86  Resp: 15 (!) 24 16 17   Temp:      TempSrc:      SpO2: 99%  98% 99%  Weight:         Physical Exam:  General adult female in bed in no acute distress  HEENT normocephalic atraumatic extraocular movements intact sclera anicteric Neck supple trachea midline in c-collar Lungs clear to auscultation bilaterally normal work of breathing at rest on 2 liters Heart S1S2 no rub Abdomen soft nontender nondistended Extremities trace edema Psych normal mood and affect Neuro - alert but oriented x 3 Access LUE AVF in use  Medications reviewed   Labs:     Latest Ref Rng & Units 03/18/2024    4:38 AM 03/17/2024    2:27 AM 03/16/2024    7:55 PM  BMP  Glucose 70 - 99 mg/dL 99  896  899   BUN 8 - 23 mg/dL 38  28  27   Creatinine 0.44 - 1.00 mg/dL 1.75  3.86  4.19   Sodium 135 - 145 mmol/L 137  136  137   Potassium 3.5 - 5.1 mmol/L 4.2  4.0  4.1   Chloride 98 - 111 mmol/L 91  95  99   CO2 22 - 32 mmol/L 28  28    Calcium  8.9 - 10.3 mg/dL 8.5  9.4       Assessment/Plan:     # Fall with Left pubic rami fracture - Per orthopedic surgery - it doesn't appear  that she was dizzy prior to her fall   # ESRD - HD per Monday Wednesday Friday schedule - note findings of possible pulm HTN - UF as tolerated with HD - strict ins/outs and daily weights    # Hypertension - Optimize volume status with HD    #Anemia of CKD No ESA indicated   # Metabolic bone disease Continue Hectorol  note that she is also on Sensipar .  On Auryxia  for hyperphosphatemia.   Disposition per primary team.  I have let HD SW know that she's here with a fracture   Katheryn JAYSON Saba, MD 03/18/2024 10:43 AM

## 2024-03-18 NOTE — Progress Notes (Signed)
 PT Cancellation Note  Patient Details Name: Gloria Gallagher MRN: [id number] DOB: 03-01-1951   Cancelled Treatment:    Reason Eval/Treat Not Completed: Patient at procedure or test/unavailable (Pt off floor for HD. Will follow-up for PT treatment as schedule permits.)  Randall SAUNDERS, PT, DPT Acute Rehabilitation Services Office: [phone] Secure Chat Preferred  Gloria Gallagher 03/18/2024, 8:23 AM

## 2024-03-18 NOTE — Progress Notes (Signed)
 OT Cancellation Note  Patient Details Name: Gloria Gallagher MRN: [id number] DOB: 1951/04/15   Cancelled Treatment:    Reason Eval/Treat Not Completed: Patient at procedure or test/ unavailable. Pt at hemodialysis. Had scheduled with family for a 9:15 appointment this AM for family education (not knowing what time pt would be going to dialysis). Called and left message for family that we needed to reschedule to 14:00 today or 9:15 in the morning.   Donny BECKER OT Acute Rehabilitation Services Office [phone]    Rodgers Dorothyann Distel 03/18/2024, 8:47 AM

## 2024-03-18 NOTE — Progress Notes (Signed)
  Received patient in bed to unit.   Informed consent signed and in chart.    TX duration:3.5     Transported by  Hand-off given to patient's nurse.    Access used: left AVF Access issues: Artieral bleed for 20 min after treatment   Total UF removed:2500  Medication(s) given: None Post HD VS: 123/66      Hunter Hacking LPN Kidney Dialysis Unit

## 2024-03-18 NOTE — Progress Notes (Signed)
 PROGRESS NOTE    Gloria Gallagher  FMW:[id number] DOB: Oct 05, 1950 DOA: 03/16/2024 PCP: Campbell Reynolds, NP    Brief Narrative:  Gloria Gallagher is a 73 y.o. female with medical history significant for ESRD on HD MWF, PVD, PAF on Eliquis , chronic HFpEF, who presents to the ER after a mechanical fall at home. The patient was attempting to hit a fly, lost her balance and fell, LOC. Level 2 trauma was activated due to the patient being on blood thinners. Report pelvic pain. Left-sided pubic rami fracture and C1-C2 subluxation revealed on CT scan.  Seen by trauma team and orthopedic surgery.  Recommended nonoperative management.  Weightbearing as tolerated.  Return to clinic in 4 weeks for x-ray following discharge.  Pain control.  Discussed the case with neurosurgery, recommended to keep the patient on cervical collar.     Assessment and Plan: Fall, mechanical, POA Left-sided pubic rami fracture and C1-C2 subluxation revealed on CT scan.  Seen by trauma team and orthopedic surgery.  Recommended nonoperative management.  Weightbearing as tolerated.  Return to clinic in 4 weeks for x-ray following discharge.  Pain control Discussed the case with neurosurgery, recommended to keep the patient on cervical collar Continue pain control and fall precautions Continue to keep the patient on cervical collar as recommended by neurosurgery PT OT evaluation   C1-C2 subluxation, POA Remote ununited displaced type II dens fracture with mild posterior subluxation of C2 relative to the lateral masses of C1, indeterminate acuity given Motion limited study without definitive edema or fluid collection to suggest acute ligamentous injury Neurosurgery saw on 11/5: Noted unstable fracture, with risk for catastrophic neurologic injury, recommend wearing hard cervical collar at all times. She is fine to mobilize from our perspective with the hard cervical collar. Follow up with Dr. Louis in the office in two weeks.     Left-sided pubic rami fracture, POA Weightbearing as tolerated PT OT evaluation under the guidance of orthopedic surgery Follow-up outpatient 4 weeks after discharge for repeat imaging   Paroxysmal A-fib on Eliquis  Eliquis  on hold for now Resume home amiodarone  for rate control Continue to monitor on telemetry   PVD Resume antiplatelets when okay with orthopedic surgery and neurosurgery. Resume home statin   ESRD on HD MWF Nephrology on board    DVT prophylaxis: SCDs Start: 03/16/24 2326 apixaban  (ELIQUIS ) tablet 5 mg    Code Status: Full Code Family Communication: None at bedside  Disposition Plan:  Level of care: Telemetry Status is: Inpatient     Consultants:  Trauma Ortho Neurosurgery   Subjective: Reports discomfort with a hard cervical collar, otherwise denies any new complaints.  Saw patient during dialysis.   Objective: Vitals:   03/18/24 1000 03/18/24 1030 03/18/24 1100 03/18/24 1207  BP: (!) 148/73 130/70 (!) 152/75 123/66  Pulse: 77 78 77 72  Resp: 19 18 17 19   Temp:    (!) 97.4 F (36.3 C)  TempSrc:      SpO2: 98% 98% 97% 97%  Weight:        Intake/Output Summary (Last 24 hours) at 03/18/2024 1449 Last data filed at 03/18/2024 1207 Gross per 24 hour  Intake --  Output 2500 ml  Net -2500 ml   Filed Weights   03/18/24 0601  Weight: 68 kg    Examination: General: NAD, noted cervical collar Cardiovascular: S1, S2 present Respiratory: CTAB Abdomen: Soft, nontender, nondistended, bowel sounds present Musculoskeletal: No bilateral pedal edema noted Skin: Normal Psychiatry: Normal mood  Data Reviewed: I have personally reviewed following labs and imaging studies  CBC: Recent Labs  Lab 03/16/24 1919 03/16/24 1955 03/17/24 0227 03/18/24 0438  WBC 4.3  --  4.7 3.5*  HGB 12.9 14.3 11.3* 11.4*  HCT 41.4 42.0 35.5* 35.8*  MCV 96.3  --  95.9 95.2  PLT 149*  --  122* 126*   Basic Metabolic Panel: Recent Labs  Lab  03/16/24 1919 03/16/24 1955 03/17/24 0227 03/18/24 0438  NA 139 137 136 137  K 4.2 4.1 4.0 4.2  CL 95* 99 95* 91*  CO2 28  --  28 28  GLUCOSE 101* 100* 103* 99  BUN 24* 27* 28* 38*  CREATININE 5.77* 5.80* 6.13* 8.24*  CALCIUM  10.2  --  9.4 8.5*  PHOS  --   --  4.6  --    GFR: Estimated Creatinine Clearance: 5.4 mL/min (A) (by C-G formula based on SCr of 8.24 mg/dL (H)). Liver Function Tests: Recent Labs  Lab 03/16/24 1919 03/17/24 0227  AST 22  --   ALT 14  --   ALKPHOS 170*  --   BILITOT 0.8  --   PROT 7.5  --   ALBUMIN  3.6 3.4*   No results for input(s): LIPASE, AMYLASE in the last 168 hours. No results for input(s): AMMONIA in the last 168 hours. Coagulation Profile: Recent Labs  Lab 03/16/24 1919  INR 1.4*   Cardiac Enzymes: No results for input(s): CKTOTAL, CKMB, CKMBINDEX, TROPONINI in the last 168 hours. BNP (last 3 results) No results for input(s): PROBNP in the last 8760 hours. HbA1C: No results for input(s): HGBA1C in the last 72 hours. CBG: No results for input(s): GLUCAP in the last 168 hours. Lipid Profile: No results for input(s): CHOL, HDL, LDLCALC, TRIG, CHOLHDL, LDLDIRECT in the last 72 hours. Thyroid  Function Tests: No results for input(s): TSH, T4TOTAL, FREET4, T3FREE, THYROIDAB in the last 72 hours. Anemia Panel: No results for input(s): VITAMINB12, FOLATE, FERRITIN, TIBC, IRON , RETICCTPCT in the last 72 hours. Sepsis Labs: Recent Labs  Lab 03/16/24 1955  LATICACIDVEN 2.8*    No results found for this or any previous visit (from the past 240 hours).       Radiology Studies: CT CHEST ABDOMEN PELVIS W CONTRAST Result Date: 03/16/2024 EXAM: CT CHEST, ABDOMEN AND PELVIS WITH CONTRAST 03/16/2024 10:32:39 PM TECHNIQUE: CT of the chest, abdomen and pelvis was performed with the administration of 75 mL of iohexol  (OMNIPAQUE ) 350 MG/ML injection. Multiplanar reformatted images are  provided for review. Automated exposure control, iterative reconstruction, and/or weight based adjustment of the mA/kV was utilized to reduce the radiation dose to as low as reasonably achievable. COMPARISON: 11/20/2022 CLINICAL HISTORY: fall on thinners FINDINGS: CHEST: MEDIASTINUM AND LYMPH NODES: Marked cardiomegaly coronary artery and aortic atherosclerosis. Marked dilatation of the main pulmonary artery measuring 6.4 cm. The central airways are clear. No mediastinal, hilar or axillary lymphadenopathy. LUNGS AND PLEURA: Moderate emphysema. An 11 mm right lower lobe pulmonary nodule is stable. Left base atelectasis. No focal consolidation or pulmonary edema. No pleural effusion or pneumothorax. ABDOMEN AND PELVIS: LIVER: Innumerable cysts throughout the liver unchanged since prior study. GALLBLADDER AND BILE DUCTS: Gallbladder is unremarkable. No biliary ductal dilatation. SPLEEN: No acute abnormality. PANCREAS: No acute abnormality. ADRENAL GLANDS: No acute abnormality. KIDNEYS, URETERS AND BLADDER: Innumerable cysts replace much of the renal parenchyma bilaterally, compatible with polycystic kidney disease, unchanged. No stones in the kidneys or ureters. No hydronephrosis. No perinephric or periureteral stranding. Urinary bladder decompressed, grossly  unremarkable. GI AND BOWEL: Stomach demonstrates no acute abnormality. Diffuse colonic diverticulosis. There is no bowel obstruction. REPRODUCTIVE ORGANS: Calcified fibroids in the uterus. PERITONEUM AND RETROPERITONEUM: No ascites. No free air. VASCULATURE: Aorta is normal in caliber with aortic and aortoiliac atherosclerosis. ABDOMINAL AND PELVIS LYMPH NODES: No lymphadenopathy. BONES AND SOFT TISSUES: No acute osseous abnormality. No focal soft tissue abnormality. IMPRESSION: 1. No evidence of acute traumatic injury. 2. Marked dilatation of the main pulmonary artery measuring 6.4 cm, which may reflect pulmonary hypertension. 3. Stable 11 mm right lower lobe  pulmonary nodule. This could be followed with repeat CT in 1 year to ensure 2 years stability. 4. Marked cardiomegaly. 5. Emphysema. 6. Polycystic kidney disease. 7. Diffuse colonic diverticulosis. Electronically signed by: Franky Crease MD 03/16/2024 11:06 PM EST RP Workstation: [id number]   MR Cervical Spine Wo Contrast Result Date: 03/16/2024 EXAM: MRI CERVICAL SPINE WITH AND WITHOUT CONTRAST 03/16/2024 09:13:37 PM TECHNIQUE: Multiplanar multisequence MRI of the cervical spine was performed without and with the administration of intravenous contrast. COMPARISON: CT cervical spine 03/16/2021 and 01/17/2022. CLINICAL HISTORY: Neck trauma, ligament injury suspected (Age >= 16y); ?AA subluxation s/p fall. FINDINGS: LIMITATIONS: Examination limited by motion artifact. BONES AND ALIGNMENT: Remote ununited displaced type 2 dens fracture. No acute findings of cervical spine fracture is identified. Other than the malalignment of the fracture, the cervical vertebral bodies are normally aligned. As demonstrated on the CT scan, there is mild posterior subluxation of C2 compared to the lateral masses of C1. This could be acute or chronic. SPINAL CORD: Abnormal T2 and STIR signal intensity in the left side of the cervical cord at C2 at the level of the patient's fracture likely an old injury with myelomalacia but an acute cord contusion cannot be completely excluded. No evidence of hemorrhage but the gradient echo sequence is quite limited. SOFT TISSUES: No paraspinal mass. No obvious hematoma. I don't see any definite surrounding fluid collections or edema to suggest an acute ligamentous injury, but the study is limited. NO DISC PROTRUSIONS, SPINAL OR FORAMINAL STENOSIS. IMPRESSION: 1. Remote ununited displaced type 2 dens fracture with mild posterior subluxation of C2 relative to the lateral masses of C1, indeterminate acuity given motion-limited study without definitive edema or fluid collection to suggest acute  ligamentous injury. 2. No acute cervical spine fracture identified. 3. 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 Could not totally exclude the possibility of an acute contusion. Electronically signed by: Maude Stammer MD 03/16/2024 09:48 PM EST RP Workstation: [id number]   CT Cervical Spine Wo Contrast Result Date: 03/16/2024 EXAM: CT CERVICAL SPINE WITHOUT CONTRAST 03/16/2024 07:47:29 PM TECHNIQUE: CT of the cervical spine was performed without the administration of intravenous contrast. Multiplanar reformatted images are provided for review. Automated exposure control, iterative reconstruction, and/or weight based adjustment of the mA/kV was utilized to reduce the radiation dose to as low as reasonably achievable. COMPARISON: ct c spine 9.6.23 CLINICAL HISTORY: Neck trauma (Age >= 65y) FINDINGS: CERVICAL SPINE: BONES AND ALIGNMENT: Chronic non-fused healed type 2 dens fracture with interval increased subluxation of the dens anteriorly in relation to the remainder of the C2 vertebral body (6 mm compared to prior 4 mm). Only partial articulation of the C1 lateral masses and C2 articular facets (6:20,36). No acute fracture or traumatic malalignment. DEGENERATIVE CHANGES: Interval stenosis and mass effect on the central canal at the C1-C2 level (6:28). SOFT TISSUES: Atherosclerotic plaque in the carotid  arteries within the neck. No prevertebral soft tissue swelling. Recommend further evaluation with MRI cervical spine noncontrast. IMPRESSION: 1. 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 Recommend C-collar placement and noncontrast MRI of the cervical spine for further evaluation. 2. No acute fracture. 3. These findings were discussed with Dr. Pamella over the phone with Dr. Margarite on 04/12/2024 at 08:09 pm . Electronically signed by: Kate Margarite MD 03/16/2024 08:11 PM  EST RP Workstation: [id number]   DG Hip Unilat W or Wo Pelvis 2-3 Views Left Result Date: 03/16/2024 EXAM: 2 or 3 VIEW(S) XRAY OF THE LEFT HIP 03/16/2024 07:55:00 PM COMPARISON: CT abdomen and pelvis 11/20/22 CLINICAL HISTORY: L hip pain s/p fall FINDINGS: BONES AND JOINTS: Left inferior pubic ramus lucency suggestive of underlying fracture. The hip joint is maintained. No significant degenerative changes. Grossly unremarkable sacrum -limited evaluation due to overlapping osseous structures and overlying soft tissues. SOFT TISSUES: Severe atherosclerotic plaque. Coarsened calcifications overlying the pelvis consistent with known fibroids. IMPRESSION: 1. Left inferior pubic ramus lucency suspicious for fracture. Electronically signed by: Morgane Naveau MD 03/16/2024 08:01 PM EST RP Workstation: [id number]   DG Knee 2 Views Left Result Date: 03/16/2024 EXAM: 1 or 2 VIEW(S) XRAY OF THE LEFT KNEE 03/16/2024 07:55:00 PM COMPARISON: None available. CLINICAL HISTORY: L hip pain s/p fall FINDINGS: BONES AND JOINTS: No acute fracture. No focal osseous lesion. No joint dislocation. No significant joint effusion. No significant degenerative changes. SOFT TISSUES: The soft tissues are unremarkable. VASCULATURE: Severe atherosclerotic plaque. IMPRESSION: 1. No acute fracture or dislocation. 2. Severe atherosclerotic plaque. Electronically signed by: Morgane Naveau MD 03/16/2024 07:59 PM EST RP Workstation: [id number]   CT Head Wo Contrast Result Date: 03/16/2024 EXAM: CT HEAD WITHOUT CONTRAST 03/16/2024 07:47:29 PM TECHNIQUE: CT of the head was performed without the administration of intravenous contrast. Automated exposure control, iterative reconstruction, and/or weight based adjustment of the mA/kV was utilized to reduce the radiation dose to as low as reasonably achievable. COMPARISON: CT head 01/17/22 CLINICAL HISTORY: Head trauma, minor (Age >= 65y). FINDINGS: BRAIN AND VENTRICLES: Diffuse cerebral and cerebellar  atrophy. Patchy and confluent areas of decreased attenuation are noted throughout the deep and periventricular white matter of the cerebral hemispheres bilaterally, suggestive of chronic microvascular ischemic changes. Atherosclerotic calcifications are present within the cavernous internal carotid and vertebral arteries. No acute hemorrhage. No evidence of acute infarct. No hydrocephalus. No extra-axial collection. No mass effect or midline shift. ORBITS: No acute abnormality. SINUSES: Paranasal sinuses are clear. SOFT TISSUES AND SKULL: Partial right mastoid air cell effusion. No fluid within the right middle ear. Partial left mastoid air cell effusion. No fluid within the left middle ear. No acute soft tissue abnormality. No skull fracture. IMPRESSION: 1. No acute intracranial abnormality. 2. Partial right and left mastoid air cell effusions. Electronically signed by: Morgane Naveau MD 03/16/2024 07:58 PM EST RP Workstation: [id number]   DG Pelvis Portable Result Date: 03/16/2024 CLINICAL DATA:  Trauma EXAM: PORTABLE PELVIS 1-2 VIEWS COMPARISON:  None Available. FINDINGS: Osteopenia.No evidence of pelvic bone diastasis.Subtle lucency through the inferior pubic ramus on the left. Subtle cortical irregularity may also be present along the superior pubic ramus on the left.There is no evidence of arthropathy or other focal bone abnormality. Diffuse aortoiliac atherosclerosis with extensive peripheral vascular atherosclerosis. IMPRESSION: Osteopenia. Findings worrisome for left inferior and superior pubic rami fractures. Electronically Signed   By: Rogelia Myers M.D.   On: 03/16/2024 19:32   DG Chest [facility]  Result Date: 03/16/2024 CLINICAL DATA:  Trauma , fall EXAM: PORTABLE CHEST - 1 VIEW COMPARISON:  Oct 02, 2022, Oct 01, 2022 FINDINGS: Chronic coarsening of the pulmonary interstitium with streaky opacities in the left mid and lower lung zones, likely emphysema with atelectasis or scarring, unchanged.  No new airspace consolidation, pleural effusion, or pneumothorax. Similar elevation of the left hemidiaphragm. Mild cardiomegaly. Interval decrease in size of the pericardial effusion. Tortuous aorta with aortic atherosclerosis. No acute fracture or destructive lesions. Multilevel thoracic osteophytosis. Likely loose body in the left shoulder joint. Diffuse osteopenia. IMPRESSION: No acute cardiopulmonary abnormality. Electronically Signed   By: Rogelia Myers M.D.   On: 03/16/2024 19:28        Scheduled Meds:  (feeding supplement) PROSource Plus  30 mL Oral BID BM   acetaminophen   1,000 mg Oral TID   amiodarone   200 mg Oral Daily   apixaban   5 mg Oral BID   atorvastatin   20 mg Oral QPM   Chlorhexidine  Gluconate Cloth  6 each Topical Q0600   cinacalcet   90 mg Oral QPM   doxercalciferol   4 mcg Intravenous Q M,W,F-HD   ferric citrate   420 mg Oral TID WC   methocarbamol  1,000 mg Oral Q8H   multivitamin  1 tablet Oral QHS   Continuous Infusions:   LOS: 2 days    Time spent: 45 minutes spent on chart review, discussion with nursing staff, consultants, updating family and interview/physical exam; more than 50% of that time was spent in counseling and/or coordination of care.    Lebron JINNY Cage, MD [facility] Available via Epic secure chat 7am-7pm After these hours, please refer to coverage provider listed on [email or web address] 03/18/2024, 2:49 PM

## 2024-03-18 NOTE — H&P (View-Only) (Signed)
 Patient currently off floor and unavailable.  Patient with known type II odontoid fracture which she is declined treatment on in the past.  Patient with recent fall and worsening angulation and anterior translation of her odontoid fracture.  Patient with some early signal change in her upper cervical cord.  This is an unstable fracture and does put her at risk for catastrophic neurologic injury.  Patient is in poor general health and does not wish to consider surgery however.  I will circle back tomorrow and discuss things with her further but at this point recommend bracing and activity as tolerated.

## 2024-03-18 NOTE — Progress Notes (Signed)
 Occupational Therapy Treatment Patient Details Name: Gloria Gallagher MRN: [id number] DOB: 11-28-1950 Today's Date: 03/18/2024   History of present illness The pt is a 73 yo female presenting 11/3 after a fall on blood thinners. CT showed C1-2 subluxation, neurosurgery consulted, and L-sided pubic rami fx. PMH includes: anemia, arthritis, afib, CAD, ESRD on HD MWF, and neuropathy.   OT comments  This 73 yo female is doing better today, still having issues with getting LLE out of bed, but is taking better steps today and is able to cross one leg over the other alternately to get to feet for LBADLs. She will continue to benefit from acute OT with follow up HHOT.      If plan is discharge home, recommend the following:  A little help with walking and/or transfers;A lot of help with bathing/dressing/bathroom;Assistance with cooking/housework;Assist for transportation;Help with stairs or ramp for entrance   Equipment Recommendations  None recommended by OT       Precautions / Restrictions Precautions Precautions: Fall;Cervical Precaution Booklet Issued: Yes (comment) Recall of Precautions/Restrictions: Intact Required Braces or Orthoses: Cervical Brace Cervical Brace: Hard collar;At all times Restrictions Weight Bearing Restrictions Per Provider Order: Yes LLE Weight Bearing Per Provider Order: Weight bearing as tolerated       Mobility Bed Mobility Overal bed mobility: Needs Assistance Bed Mobility: Supine to Sit     Supine to sit: HOB elevated, Min assist, Used rails     General bed mobility comments: pt needed A for LLE for OOB    Transfers Overall transfer level: Needs assistance Equipment used: Rolling walker (2 wheels) Transfers: Sit to/from Stand Sit to Stand: Contact guard assist           General transfer comment: VCs for safe hand placement     Balance Overall balance assessment: Needs assistance Sitting-balance support: No upper extremity supported,  Feet supported Sitting balance-Leahy Scale: Good     Standing balance support: Bilateral upper extremity supported, Reliant on assistive device for balance Standing balance-Leahy Scale: Poor                             ADL either performed or assessed with clinical judgement   ADL Overall ADL's : Needs assistance/impaired               Lower Body Bathing Details (indicate cue type and reason): while seated pt is able to cross one leg over the other alternately to get to toes with increased time       Lower Body Dressing Details (indicate cue type and reason): while seated pt is able to cross one leg over the other alternately to get to toes with increased time for donning socks Toilet Transfer: Minimal assistance;Ambulation;Rolling walker (2 wheels) Toilet Transfer Details (indicate cue type and reason): simulated bed>to window>recliner                Extremity/Trunk Assessment Upper Extremity Assessment Upper Extremity Assessment: Overall WFL for tasks assessed            Vision Baseline Vision/History: 1 Wears glasses Ability to See in Adequate Light: 0 Adequate Patient Visual Report: No change from baseline           Communication Communication Communication: No apparent difficulties   Cognition Arousal: Alert Behavior During Therapy: [facility] for tasks assessed/performed  Cueing   Cueing Techniques: Verbal cues        General Comments Pt ambulating today better    Pertinent Vitals/ Pain       Pain Assessment Pain Assessment: Faces Faces Pain Scale: Hurts little more Pain Location: left pubic area and where c-collar presses on her collar bone Pain Descriptors / Indicators: Aching, Sore Pain Intervention(s): Limited activity within patient's tolerance, Monitored during session, Repositioned (sent secure chat to Dr. Louis to see if c-collar type could be changed)          Frequency  Min 2X/week        Progress Toward Goals  OT Goals(current goals can now be found in the care plan section)  Progress towards OT goals: Progressing toward goals  Acute Rehab OT Goals Patient Stated Goal: to hopefully go home tomorrow OT Goal Formulation: With patient Time For Goal Achievement: 03/31/24 Potential to Achieve Goals: Good         AM-PAC OT 6 Clicks Daily Activity     Outcome Measure   Help from another person eating meals?: None Help from another person taking care of personal grooming?: A Little Help from another person toileting, which includes using toliet, bedpan, or urinal?: A Little Help from another person bathing (including washing, rinsing, drying)?: A Little Help from another person to put on and taking off regular upper body clothing?: A Little Help from another person to put on and taking off regular lower body clothing?: A Little 6 Click Score: 19    End of Session Equipment Utilized During Treatment: Gait belt;Rolling walker (2 wheels);Cervical collar  OT Visit Diagnosis: Unsteadiness on feet (R26.81);Other abnormalities of gait and mobility (R26.89);Pain;Muscle weakness (generalized) (M62.81) Pain - Right/Left: Left Pain - part of body: Leg   Activity Tolerance Patient tolerated treatment well   Patient Left in chair;with call bell/phone within reach;with chair alarm set           Time: 1515-1540 OT Time Calculation (min): 25 min  Charges: OT General Charges $OT Visit: 1 Visit OT Treatments $Self Care/Home Management : 23-37 mins  Donny BECKER OT Acute Rehabilitation Services Office [phone]    [place] Dorothyann Distel 03/18/2024, 8:54 PM

## 2024-03-18 NOTE — Plan of Care (Signed)

## 2024-03-19 ENCOUNTER — Inpatient Hospital Stay (HOSPITAL_COMMUNITY)

## 2024-03-19 ENCOUNTER — Other Ambulatory Visit: Payer: Self-pay

## 2024-03-19 ENCOUNTER — Other Ambulatory Visit: Payer: Self-pay | Admitting: Neurosurgery

## 2024-03-19 DIAGNOSIS — W19XXXA Unspecified fall, initial encounter: Secondary | ICD-10-CM | POA: Diagnosis not present

## 2024-03-19 DIAGNOSIS — N186 End stage renal disease: Secondary | ICD-10-CM | POA: Diagnosis not present

## 2024-03-19 LAB — BASIC METABOLIC PANEL WITH GFR
Anion gap: 17 — ABNORMAL HIGH (ref 5–15)
BUN: 24 mg/dL — ABNORMAL HIGH (ref 8–23)
CO2: 25 mmol/L (ref 22–32)
Calcium: 8 mg/dL — ABNORMAL LOW (ref 8.9–10.3)
Chloride: 92 mmol/L — ABNORMAL LOW (ref 98–111)
Creatinine, Ser: 5.85 mg/dL — ABNORMAL HIGH (ref 0.44–1.00)
GFR, Estimated: 7 mL/min — ABNORMAL LOW (ref >60)
Glucose, Bld: 94 mg/dL (ref 70–99)
Potassium: 3.5 mmol/L (ref 3.5–5.1)
Sodium: 134 mmol/L — ABNORMAL LOW (ref 135–145)

## 2024-03-19 LAB — CBC WITH DIFFERENTIAL/PLATELET
Abs Immature Granulocytes: 0.02 K/uL (ref 0.00–0.07)
Basophils Absolute: 0 K/uL (ref 0.0–0.1)
Basophils Relative: 1 %
Eosinophils Absolute: 0.1 K/uL (ref 0.0–0.5)
Eosinophils Relative: 1 %
HCT: 35.4 % — ABNORMAL LOW (ref 36.0–46.0)
Hemoglobin: 11 g/dL — ABNORMAL LOW (ref 12.0–15.0)
Immature Granulocytes: 1 %
Lymphocytes Relative: 18 %
Lymphs Abs: 0.8 K/uL (ref 0.7–4.0)
MCH: 29.6 pg (ref 26.0–34.0)
MCHC: 31.1 g/dL (ref 30.0–36.0)
MCV: 95.4 fL (ref 80.0–100.0)
Monocytes Absolute: 0.6 K/uL (ref 0.1–1.0)
Monocytes Relative: 13 %
Neutro Abs: 2.9 K/uL (ref 1.7–7.7)
Neutrophils Relative %: 66 %
Platelets: 134 K/uL — ABNORMAL LOW (ref 150–400)
RBC: 3.71 MIL/uL — ABNORMAL LOW (ref 3.87–5.11)
RDW: 15.3 % (ref 11.5–15.5)
WBC: 4.4 K/uL (ref 4.0–10.5)
nRBC: 0 % (ref 0.0–0.2)

## 2024-03-19 MED ORDER — ACETAMINOPHEN 500 MG PO TABS
1000.0000 mg | ORAL_TABLET | Freq: Three times a day (TID) | ORAL | Status: AC
  Administered 2024-03-19 – 2024-03-24 (×12): 1000 mg via ORAL
  Filled 2024-03-19 (×15): qty 2

## 2024-03-19 MED ORDER — CHLORHEXIDINE GLUCONATE CLOTH 2 % EX PADS: 6.0000 | MEDICATED_PAD | Freq: Every day | CUTANEOUS | Status: DC

## 2024-03-19 NOTE — Progress Notes (Signed)
 [facility] Progress Note  Name: Gloria Gallagher MRN: [id number] DOB: [birth year]  Chief Complaint:  Fall with fracture  Subjective:  Last HD on 11/5 with 2.5 kg UF.  She had on unmeasured urine void over 11/5. Feels better today - states that she is hoping to go home rather than a facility and that she has family support; note she spoke with PT re: same..    Review of systems:   She denies shortness of breath or chest pain  Denies n/v  No intake or output data in the 24 hours ending 03/19/24 1412   Vitals:  Vitals:   03/18/24 1748 03/18/24 2043 03/19/24 0447 03/19/24 0944  BP: 118/80 135/67 (!) 146/71 (!) 141/68  Pulse: 98 87 85 82  Resp: 16   15  Temp: (!) 97.5 F (36.4 C) 97.8 F (36.6 C) 98.6 F (37 C) (!) 97.5 F (36.4 C)  TempSrc: Oral Oral  Oral  SpO2: 100% 100% 100% 100%  Weight:         Physical Exam:  General adult female in bed in no acute distress  HEENT normocephalic atraumatic extraocular movements intact sclera anicteric Neck supple trachea midline in c-collar Lungs clear to auscultation bilaterally normal work of breathing at rest on room air Heart S1S2 no rub Abdomen soft nontender nondistended Extremities trace to no edema Psych normal mood and affect Neuro - alert but oriented x 3 Access LUE AVF in use  Medications reviewed   Labs:     Latest Ref Rng & Units 03/19/2024    4:04 AM 03/18/2024    4:38 AM 03/17/2024    2:27 AM  BMP  Glucose 70 - 99 mg/dL 94  99  896   BUN 8 - 23 mg/dL 24  38  28   Creatinine 0.44 - 1.00 mg/dL 4.14  1.75  3.86   Sodium 135 - 145 mmol/L 134  137  136   Potassium 3.5 - 5.1 mmol/L 3.5  4.2  4.0   Chloride 98 - 111 mmol/L 92  91  95   CO2 22 - 32 mmol/L 25  28  28    Calcium  8.9 - 10.3 mg/dL 8.0  8.5  9.4      Assessment/Plan:     # Fall with Left pubic rami fracture - Per orthopedic surgery - it doesn't appear that she was dizzy prior to her fall - PT has seen   # ESRD - HD per Monday  Wednesday Friday schedule - note findings of possible pulm HTN - UF as tolerated with HD - strict ins/outs and daily weights    # Hypertension - Optimize volume status with HD    #Anemia of CKD - No ESA indicated   # Metabolic bone disease Continue Hectorol  and Sensipar .  On Auryxia  for hyperphosphatemia.   Disposition per primary team.  I have let HD SW know that she's here with a fracture.  Per PT note pt hoping for home and they have recommendations for home health PT   Katheryn JAYSON Saba, MD 03/19/2024 2:20 PM

## 2024-03-19 NOTE — Plan of Care (Signed)
?  Problem: Health Behavior/Discharge Planning: ?Goal: Ability to manage health-related needs will improve ?Outcome: Progressing ?  ?Problem: Coping: ?Goal: Level of anxiety will decrease ?Outcome: Progressing ?  ?Problem: Safety: ?Goal: Ability to remain free from injury will improve ?Outcome: Progressing ?  ?

## 2024-03-19 NOTE — TOC Initial Note (Signed)
 Transition of Care (TOC) - Initial/Assessment Note   Spoke to patient , daughter and sister at bedside.   Patient from home with sister.   PT recommendations for home health PT and wheelchair.   Patient in agreement . She has had [facility] in the past and would like them again. Daughter asking about an aide. NCM will ask [facility] for aide however HH aide may only come once or twice  a week for bath. NCM explained patient can apply for medicaid aide through her PCP. They voiced understanding .   NCN entered orders for [facility] PT,OT and aide and wheelchair and secure chatted MD to sign.   Patient has home oxygen  already with [employer], however at discharge she will need a portable tank for drive home. NCM secure chatted MD for anticipated discharge date. Patient is scheduled for spine surgery on Monday . Discharge will be after that.   Wheelchair also ordered with [employer]  Patient Details  Name: Gloria Gallagher MRN: [id number] Date of Birth: 1950-06-13  Transition of Care [facility]) CM/SW Contact:    Stephane Powell Jansky, RN Phone Number: 03/19/2024, 12:28 PM  Clinical Narrative:                   Expected Discharge Plan: Home w Home Health Services Barriers to Discharge: Continued Medical Work up   Patient Goals and CMS Choice Patient states their goals for this hospitalization and ongoing recovery are:: to return to home CMS Medicare.gov Compare Post Acute Care list provided to:: Patient Choice offered to / list presented to : Patient, Adult Children      Expected Discharge Plan and Services   Discharge Planning Services: CM Consult Post Acute Care Choice: Home Health, Durable Medical Equipment Living arrangements for the past 2 months: Single Family Home                 DME Arranged: Wheelchair manual DME Agency: [employer] Date DME Agency Contacted: 03/19/24 Time DME Agency Contacted: 1227 Representative spoke with at DME Agency: [place] HH Arranged: PT, OT,  Nurse's Aide HH Agency: [facility] Date [facility] Agency Contacted: 03/19/24 Time HH Agency Contacted: 1227 Representative spoke with at [facility] Agency: [place]  Prior Living Arrangements/Services Living arrangements for the past 2 months: Single Family Home Lives with:: Siblings Patient language and need for interpreter reviewed:: Yes Do you feel safe going back to the place where you live?: Yes      Need for Family Participation in Patient Care: Yes (Comment) Care giver support system in place?: Yes (comment) Current home services: DME Criminal Activity/Legal Involvement Pertinent to Current Situation/Hospitalization: No - Comment as needed  Activities of Daily Living      Permission Sought/Granted   Permission granted to share information with : Yes, Verbal Permission Granted  Share Information with NAME: daughter Suzen  Permission granted to share info w AGENCY: [facility], [employer]        Emotional Assessment Appearance:: Appears stated age Attitude/Demeanor/Rapport: Engaged Affect (typically observed): Appropriate Orientation: : Oriented to Self, Oriented to Place, Oriented to  Time, Oriented to Situation Alcohol / Substance Use: Not Applicable Psych Involvement: No (comment)  Admission diagnosis:  Fall [W19.XXXA] Patient Active Problem List   Diagnosis Date Noted   Fall 03/16/2024   Status post femoral-popliteal bypass surgery 09/25/2023   Critical limb ischemia of left lower extremity (HCC) 09/25/2023   Aortic insufficiency 02/19/2023   Malnutrition of moderate degree 10/04/2022   Pericardial effusion 10/02/2022   Shock (  HCC) 10/02/2022   Cardiac tamponade 10/02/2022   HTN (hypertension) 03/20/2022   Fatigue fracture of vertebra, cervical region, initial encounter for fracture 01/23/2022   AMS (altered mental status) 01/17/2022   Odontoid fracture (HCC) 01/17/2022   ESRD (end stage renal disease) on dialysis (HCC) 01/17/2022   Pruritus, unspecified  12/13/2021   Hepatic lesion 09/19/2021   Screening for colorectal cancer 09/19/2021   Persistent atrial fibrillation (HCC) 04/25/2021   Secondary hypercoagulable state 04/25/2021   Dependence on renal dialysis 02/11/2020   Fluid overload, unspecified 01/12/2020   Other disorders of plasma-protein metabolism, not elsewhere classified 11/18/2019   Allergy, unspecified, initial encounter 11/13/2019   Anaphylactic shock, unspecified, initial encounter 11/13/2019   Pain, unspecified 08/27/2019   Secondary hyperparathyroidism of renal origin 05/09/2019   Anemia in chronic kidney disease 03/31/2019   Carcinoma in situ of cervix, unspecified 03/31/2019   Coagulation defect, unspecified 03/31/2019   Disorder of thyroid , unspecified 03/31/2019   Functional dyspepsia 03/31/2019   Gout, unspecified 03/31/2019   Iron  deficiency anemia, unspecified 03/31/2019   Other disorders of electrolyte and fluid balance, not elsewhere classified 03/31/2019   Other disorders of phosphorus metabolism 03/31/2019   Polycystic kidney, adult type 03/31/2019   Renal osteodystrophy 03/31/2019   SOB (shortness of breath) 03/31/2019   PCP:  Campbell Reynolds, NP Pharmacy:   [id number] [place], [place] - [address] [place] [address] Phone: [phone] Fax: [phone]  [facility] [address] [place] [address] Phone: [phone] Fax: [phone]  [facility] - [place], [place] - [address] [place] [address] Phone: [phone] Fax: [phone]  [facility] - [place], [place] - [address] [place] [address] Phone: [phone] Fax: [phone]     Social Drivers of Health (SDOH) Social History: SDOH Screenings   Food Insecurity: Patient Declined (03/17/2024)  Housing: Patient Declined  (03/17/2024)  Transportation Needs: Patient Declined (03/17/2024)  Utilities: Patient Declined (03/17/2024)  Social Connections: Patient Declined (03/17/2024)  Tobacco Use: Medium Risk (02/25/2024)   SDOH Interventions: Food Insecurity Interventions: Patient Declined Housing Interventions: Patient Declined Transportation Interventions: Patient Declined Utilities Interventions: Patient Declined Social Connections Interventions: Patient Declined   Readmission Risk Interventions    09/30/2023   12:10 PM  Readmission Risk Prevention Plan  Post Dischage Appt Complete  Medication Screening Complete  Transportation Screening Complete

## 2024-03-19 NOTE — Progress Notes (Signed)
 PROGRESS NOTE    Gloria Gallagher  FMW:[id number] DOB: 11-24-1950 DOA: 03/16/2024 PCP: Campbell Reynolds, NP    Brief Narrative:  Gloria Gallagher is a 73 y.o. female with medical history significant for ESRD on HD MWF, PVD, PAF on Eliquis , chronic HFpEF, who presents to the ER after a mechanical fall at home. The patient was attempting to hit a fly, lost her balance and fell, LOC. Level 2 trauma was activated due to the patient being on blood thinners. Report pelvic pain. Left-sided pubic rami fracture and C1-C2 subluxation revealed on CT scan.  Seen by trauma team and orthopedic surgery.  Recommended nonoperative management.  Weightbearing as tolerated.  Return to clinic in 4 weeks for x-ray following discharge.  Pain control.  Discussed the case with neurosurgery, recommended to keep the patient on cervical collar and plan for surgery on 03/23/24     Assessment and Plan: Fall, mechanical, POA Continue pain control and fall precautions Continue to keep the patient on cervical collar as recommended by neurosurgery PT OT evaluation   C1-C2 subluxation, POA Remote ununited displaced type II dens fracture with mild posterior subluxation of C2 relative to the lateral masses of C1, indeterminate acuity given Motion limited study without definitive edema or fluid collection to suggest acute ligamentous injury Neurosurgery consulted, noted unstable fracture, with risk for catastrophic neurologic injury, recommend wearing hard cervical collar at all times for now and plan for surgery on 03/23/24 TTE ordered for preoperative evaluation Pt ok to mobilize with the hard cervical collar.   Left-sided pubic rami fracture, POA Weightbearing as tolerated PT OT evaluation under the guidance of orthopedic surgery Follow-up outpatient 4 weeks after discharge for repeat imaging Pain management with Tylenol  scheduled, reports opioids making her nauseous   Paroxysmal A-fib on Eliquis  Resume home amiodarone   for rate control Continue Eliquis  Continue to monitor on telemetry   PVD Resume antiplatelets when okay with orthopedic surgery and neurosurgery. Resume home statin   ESRD on HD MWF Nephrology on board    DVT prophylaxis: SCDs Start: 03/16/24 2326 apixaban  (ELIQUIS ) tablet 5 mg    Code Status: Full Code Family Communication: None at bedside  Disposition Plan:  Level of care: Telemetry Status is: Inpatient     Consultants:  Trauma Ortho Neurosurgery   Subjective: Met patient ambulating in the hallway with a walker and PT. denies any new complaints.  Patient wants to remain in the hospital until Monday for her surgery, unwilling to be discharged home prior to surgery.   Objective: Vitals:   03/18/24 1748 03/18/24 2043 03/19/24 0447 03/19/24 0944  BP: 118/80 135/67 (!) 146/71 (!) 141/68  Pulse: 98 87 85 82  Resp: 16   15  Temp: (!) 97.5 F (36.4 C) 97.8 F (36.6 C) 98.6 F (37 C) (!) 97.5 F (36.4 C)  TempSrc: Oral Oral  Oral  SpO2: 100% 100% 100% 100%  Weight:       No intake or output data in the 24 hours ending 03/19/24 1506  Filed Weights   03/18/24 0601  Weight: 68 kg    Examination: General: NAD, noted cervical collar Cardiovascular: S1, S2 present Respiratory: CTAB Abdomen: Soft, nontender, nondistended, bowel sounds present Musculoskeletal: No bilateral pedal edema noted Skin: Normal Psychiatry: Normal mood       Data Reviewed: I have personally reviewed following labs and imaging studies  CBC: Recent Labs  Lab 03/16/24 1919 03/16/24 1955 03/17/24 0227 03/18/24 0438 03/19/24 0404  WBC 4.3  --  4.7  3.5* 4.4  NEUTROABS  --   --   --   --  2.9  HGB 12.9 14.3 11.3* 11.4* 11.0*  HCT 41.4 42.0 35.5* 35.8* 35.4*  MCV 96.3  --  95.9 95.2 95.4  PLT 149*  --  122* 126* 134*   Basic Metabolic Panel: Recent Labs  Lab 03/16/24 1919 03/16/24 1955 03/17/24 0227 03/18/24 0438 03/19/24 0404  NA 139 137 136 137 134*  K 4.2 4.1 4.0 4.2  3.5  CL 95* 99 95* 91* 92*  CO2 28  --  28 28 25   GLUCOSE 101* 100* 103* 99 94  BUN 24* 27* 28* 38* 24*  CREATININE 5.77* 5.80* 6.13* 8.24* 5.85*  CALCIUM  10.2  --  9.4 8.5* 8.0*  PHOS  --   --  4.6  --   --    GFR: Estimated Creatinine Clearance: 7.6 mL/min (A) (by C-G formula based on SCr of 5.85 mg/dL (H)). Liver Function Tests: Recent Labs  Lab 03/16/24 1919 03/17/24 0227  AST 22  --   ALT 14  --   ALKPHOS 170*  --   BILITOT 0.8  --   PROT 7.5  --   ALBUMIN  3.6 3.4*   No results for input(s): LIPASE, AMYLASE in the last 168 hours. No results for input(s): AMMONIA in the last 168 hours. Coagulation Profile: Recent Labs  Lab 03/16/24 1919  INR 1.4*   Cardiac Enzymes: No results for input(s): CKTOTAL, CKMB, CKMBINDEX, TROPONINI in the last 168 hours. BNP (last 3 results) No results for input(s): PROBNP in the last 8760 hours. HbA1C: No results for input(s): HGBA1C in the last 72 hours. CBG: No results for input(s): GLUCAP in the last 168 hours. Lipid Profile: No results for input(s): CHOL, HDL, LDLCALC, TRIG, CHOLHDL, LDLDIRECT in the last 72 hours. Thyroid  Function Tests: No results for input(s): TSH, T4TOTAL, FREET4, T3FREE, THYROIDAB in the last 72 hours. Anemia Panel: No results for input(s): VITAMINB12, FOLATE, FERRITIN, TIBC, IRON , RETICCTPCT in the last 72 hours. Sepsis Labs: Recent Labs  Lab 03/16/24 1955  LATICACIDVEN 2.8*    No results found for this or any previous visit (from the past 240 hours).       Radiology Studies: No results found.       Scheduled Meds:  (feeding supplement) PROSource Plus  30 mL Oral BID BM   amiodarone   200 mg Oral Daily   apixaban   5 mg Oral BID   atorvastatin   20 mg Oral QPM   Chlorhexidine  Gluconate Cloth  6 each Topical Q0600   cinacalcet   90 mg Oral QPM   doxercalciferol   4 mcg Intravenous Q M,W,F-HD   ferric citrate   420 mg Oral TID WC    methocarbamol  1,000 mg Oral Q8H   multivitamin  1 tablet Oral QHS   Continuous Infusions:   LOS: 3 days    Time spent: 45 minutes spent on chart review, discussion with nursing staff, consultants, updating family and interview/physical exam; more than 50% of that time was spent in counseling and/or coordination of care.    Lebron JINNY Cage, MD [facility] Available via Epic secure chat 7am-7pm After these hours, please refer to coverage provider listed on [email or web address] 03/19/2024, 3:06 PM

## 2024-03-19 NOTE — Progress Notes (Signed)
 Patient with significant neck pain.  Some associated headache.  Uncomfortable in collar.  Denies numbness paresthesias or weakness into her extremities.  She is awake and alert.  She is oriented and appropriate.  Her motor and sensory function are stable.  I had a longer discussion with the patient today.  She is now willing to consider surgery.  I have discussed with her that I think surgery is important for long-term safety secondary to her unstable C2 fracture.  I have discussed the risks and benefits involved with C1-C2 posterior cervical fusion with lateral mass instrumentation and interspinous wiring in hopes of improving her situation.  I discussed the risks involved with surgery including the risk of anesthesia, bleeding, infection, CSF leak, nerve root injury, spinal cord injury, fusion failure, if patient failure, continued pain, not benefit.  The patient has been given the opportunity ask questions.  She appears to understand.  She wishes to proceed with surgery.  Plan for surgery Monday afternoon.  If patient is ready for discharge before that she can be discharged home and readmitted prior to surgery however if she stays through the weekend will plan on surgery Monday.  My hope is that she can be medically optimized prior to surgery.

## 2024-03-19 NOTE — Progress Notes (Signed)
 Physical Therapy Treatment Patient Details Name: Gloria Gallagher MRN: [id number] DOB: [birth year] Today's Date: 03/19/2024   History of Present Illness The pt is a 73 yo female presenting 11/3 after a fall on blood thinners. CT showed C1-2 subluxation, neurosurgery consulted, and L-sided pubic rami fx. Plan for C1-C2 posterior cervical fusion with lateral mass instrumentation and interspinous wiring 11/10. PMH includes: anemia, arthritis, afib, CAD, ESRD on HD MWF, and neuropathy.    PT Comments  Pt greeted seated in recliner chair, pleasant and agreeable to PT session. She advanced OOB mobility, performing multiple bouts of short-distance ambulation using RW with CGA. A chair follow was utilized throughout the session with pt declining to sit down and rest. She opted for intermittent standing rest breaks. Pt demonstrated multiple gait abnormalities. She was receptive to moderate cues for improved technique and safety awareness. Discussed at this time pt would primarily be w/c level for functional mobility given the limited gait distance achieved d/t fatigue and pain. Pt reports her family can provide increased support. Recommend HHPT to increase strength, improve balance, decrease fall risk, and optimize safety within the home environment.       If plan is discharge home, recommend the following: A little help with walking and/or transfers;A little help with bathing/dressing/bathroom;Assistance with cooking/housework;Help with stairs or ramp for entrance;Assist for transportation   Can travel by private Theme Park Manager (measurements PT);Wheelchair cushion (measurements PT)    Recommendations for Other Services       Precautions / Restrictions Precautions Precautions: Cervical;Fall Precaution Booklet Issued: Yes (comment) Recall of Precautions/Restrictions: Intact Precaution/Restrictions Comments: Pt recalled her cervical neck precations. Required  Braces or Orthoses: Cervical Brace Cervical Brace: Hard collar;At all times Restrictions Weight Bearing Restrictions Per Provider Order: Yes LLE Weight Bearing Per Provider Order: Weight bearing as tolerated     Mobility  Bed Mobility               General bed mobility comments: Not assessed. Pt greeted seated in recliner chair and returned there at end of session.    Transfers Overall transfer level: Needs assistance Equipment used: Rolling walker (2 wheels) Transfers: Sit to/from Stand Sit to Stand: Supervision           General transfer comment: Pt stood from recliner chair and commode. She demonstrated proper hand placement. Powered up without physical assist. Close supervision-SBA for safety. Good eccentric control. Pt reaching back for arm rest.    Ambulation/Gait Ambulation/Gait assistance: Contact guard assist, +2 safety/equipment (Chair Follow) Gait Distance (Feet): 8 Feet (1x6, seated break on commode, 1x4, standing break with support at sink, 1x8, standing rest break, 1x8, standing rest break, 1x8, standing rest break, 1x8) Assistive device: Rolling walker (2 wheels) Gait Pattern/deviations: Step-to pattern, Step-through pattern, Decreased step length - right, Decreased step length - left, Decreased stride length, Decreased stance time - left, Decreased weight shift to left, Antalgic, Trunk flexed Gait velocity: decreased     General Gait Details: Pt ambulated with short slow steps. She self-limited WBing on LLE d/t pain. Pt had difficulty advancing LLE. Instructed pt to increase BUE WBing on RW to offload LLE. Pt varied between step-to and step-through gait pattern. She demonstrated limited knee flex bilaterally. Pt intermittently leaned backwards when in single-limb stance on LLE. Cues for proximity to RW. Pt had a tendency to lean fwd and allow elbows to be ext with RW too far away. Distance limited d/t pain and fatigue. She  declined seated rest breaks reporting  she didn't want to have to stand back up from the chair.   Stairs             Wheelchair Mobility     Tilt Bed    Modified Rankin (Stroke Patients Only)       Balance Overall balance assessment: Needs assistance Sitting-balance support: No upper extremity supported, Feet supported Sitting balance-Leahy Scale: Good     Standing balance support: Bilateral upper extremity supported, Reliant on assistive device for balance Standing balance-Leahy Scale: Poor Standing balance comment: Pt dependent on RW                            Communication Communication Communication: No apparent difficulties  Cognition Arousal: Alert Behavior During Therapy: WFL for tasks assessed/performed   PT - Cognitive impairments: No family/caregiver present to determine baseline, Sequencing, Safety/Judgement                       PT - Cognition Comments: Pt required moderate cues for improved technique and safety awareness during mobility.        Cueing Cueing Techniques: Verbal cues, Tactile cues  Exercises      General Comments        Pertinent Vitals/Pain Pain Assessment Pain Assessment: Faces Faces Pain Scale: Hurts even more Pain Location: L groin when ambulating (Pt reported no pain, just hurt) Pain Descriptors / Indicators: Grimacing, Guarding, Sore Pain Intervention(s): Monitored during session, Limited activity within patient's tolerance, Repositioned    Home Living                          Prior Function            PT Goals (current goals can now be found in the care plan section) Acute Rehab PT Goals Patient Stated Goal: Have less pain and get rid of this brace. PT Goal Formulation: With patient Time For Goal Achievement: 03/31/24 Potential to Achieve Goals: Good Progress towards PT goals: Progressing toward goals    Frequency    Min 2X/week      PT Plan      Co-evaluation              AM-PAC PT 6 Clicks  Mobility   Outcome Measure  Help needed turning from your back to your side while in a flat bed without using bedrails?: A Little Help needed moving from lying on your back to sitting on the side of a flat bed without using bedrails?: A Little Help needed moving to and from a bed to a chair (including a wheelchair)?: A Little Help needed standing up from a chair using your arms (e.g., wheelchair or bedside chair)?: A Little Help needed to walk in hospital room?: Total Help needed climbing 3-5 steps with a railing? : Total 6 Click Score: 14    End of Session Equipment Utilized During Treatment: Cervical collar Activity Tolerance: Patient tolerated treatment well;Patient limited by pain;Patient limited by fatigue Patient left: in chair;with call bell/phone within reach;with chair alarm set   PT Visit Diagnosis: Unsteadiness on feet (R26.81);Other abnormalities of gait and mobility (R26.89);Muscle weakness (generalized) (M62.81);History of falling (Z91.81);Pain Pain - Right/Left: Left Pain - part of body: Hip     Time: 9143-9079 PT Time Calculation (min) (ACUTE ONLY): 24 min  Charges:    $Gait Training: 8-22 mins $Therapeutic Activity: 8-22 mins PT  General Charges $$ ACUTE PT VISIT: 1 Visit                     Randall SAUNDERS, PT, DPT Acute Rehabilitation Services Office: [phone] Secure Chat Preferred  Gloria Gallagher 03/19/2024, 9:39 AM

## 2024-03-20 ENCOUNTER — Encounter (HOSPITAL_COMMUNITY): Payer: Self-pay | Admitting: Internal Medicine

## 2024-03-20 ENCOUNTER — Inpatient Hospital Stay (HOSPITAL_COMMUNITY)

## 2024-03-20 DIAGNOSIS — Z01810 Encounter for preprocedural cardiovascular examination: Secondary | ICD-10-CM | POA: Diagnosis not present

## 2024-03-20 DIAGNOSIS — N186 End stage renal disease: Secondary | ICD-10-CM | POA: Diagnosis not present

## 2024-03-20 DIAGNOSIS — W19XXXA Unspecified fall, initial encounter: Secondary | ICD-10-CM | POA: Diagnosis not present

## 2024-03-20 LAB — CBC WITH DIFFERENTIAL/PLATELET
Abs Immature Granulocytes: 0.03 K/uL (ref 0.00–0.07)
Basophils Absolute: 0.1 K/uL (ref 0.0–0.1)
Basophils Relative: 1 %
Eosinophils Absolute: 0.1 K/uL (ref 0.0–0.5)
Eosinophils Relative: 2 %
HCT: 34.6 % — ABNORMAL LOW (ref 36.0–46.0)
Hemoglobin: 11.2 g/dL — ABNORMAL LOW (ref 12.0–15.0)
Immature Granulocytes: 1 %
Lymphocytes Relative: 23 %
Lymphs Abs: 0.9 K/uL (ref 0.7–4.0)
MCH: 30.5 pg (ref 26.0–34.0)
MCHC: 32.4 g/dL (ref 30.0–36.0)
MCV: 94.3 fL (ref 80.0–100.0)
Monocytes Absolute: 0.7 K/uL (ref 0.1–1.0)
Monocytes Relative: 17 %
Neutro Abs: 2.3 K/uL (ref 1.7–7.7)
Neutrophils Relative %: 56 %
Platelets: 134 K/uL — ABNORMAL LOW (ref 150–400)
RBC: 3.67 MIL/uL — ABNORMAL LOW (ref 3.87–5.11)
RDW: 15.1 % (ref 11.5–15.5)
WBC: 4.1 K/uL (ref 4.0–10.5)
nRBC: 0 % (ref 0.0–0.2)

## 2024-03-20 LAB — BASIC METABOLIC PANEL WITH GFR
Anion gap: 13 (ref 5–15)
BUN: 34 mg/dL — ABNORMAL HIGH (ref 8–23)
CO2: 26 mmol/L (ref 22–32)
Calcium: 8.1 mg/dL — ABNORMAL LOW (ref 8.9–10.3)
Chloride: 91 mmol/L — ABNORMAL LOW (ref 98–111)
Creatinine, Ser: 7.78 mg/dL — ABNORMAL HIGH (ref 0.44–1.00)
GFR, Estimated: 5 mL/min — ABNORMAL LOW (ref >60)
Glucose, Bld: 95 mg/dL (ref 70–99)
Potassium: 4.1 mmol/L (ref 3.5–5.1)
Sodium: 130 mmol/L — ABNORMAL LOW (ref 135–145)

## 2024-03-20 LAB — ECHOCARDIOGRAM COMPLETE
AR max vel: 2.97 cm2
AV Area VTI: 3.03 cm2
AV Area mean vel: 2.79 cm2
AV Mean grad: 5 mmHg
AV Peak grad: 13.1 mmHg
Ao pk vel: 1.81 m/s
Area-P 1/2: 4.77 cm2
Calc EF: 64.2 %
Height: 65 in
MV M vel: 5.16 m/s
MV Peak grad: 106.5 mmHg
MV VTI: 3.64 cm2
P 1/2 time: 135 ms
S' Lateral: 3.4 cm
Single Plane A2C EF: 64.6 %
Single Plane A4C EF: 65 %
Weight: 2398.6 [oz_av]

## 2024-03-20 MED ORDER — DOXERCALCIFEROL 4 MCG/2ML IV SOLN
INTRAVENOUS | Status: AC
  Filled 2024-03-20: qty 2

## 2024-03-20 NOTE — Care Management Important Message (Signed)
 Important Message  Patient Details  Name: Gloria Gallagher MRN: [id number] Date of Birth: 1951-01-21   Important Message Given:  Yes - Medicare IM     Jon Cruel 03/20/2024, 4:28 PM

## 2024-03-20 NOTE — Progress Notes (Signed)
 [facility] Progress Note  Name: Gloria Gallagher MRN: [id number] DOB: 27-Nov-1950  Chief Complaint:  Fall with fracture  Subjective:  Last HD on 11/5 with 2.5 kg UF.  She had on unmeasured urine void over 11/6.  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  Not on oxygen  at home and has been on 2 liters here    Review of systems:    She denies shortness of breath or chest pain  Denies n/v  No intake or output data in the 24 hours ending 03/20/24 1129   Vitals:  Vitals:   03/19/24 2250 03/20/24 0524 03/20/24 0539 03/20/24 0919  BP: (!) 144/54 136/66  136/75  Pulse: 84 75  80  Resp: 18 18  16   Temp: 98.2 F (36.8 C) (!) 97.5 F (36.4 C)  (!) 97.5 F (36.4 C)  TempSrc: Oral Oral  Oral  SpO2: 100% 91%  94%  Weight:      Height:   5' 5 (1.651 m)      Physical Exam:    General adult female in bed in no acute distress  HEENT normocephalic atraumatic extraocular movements intact sclera anicteric Neck supple trachea midline in c-collar Lungs clear to auscultation bilaterally normal work of breathing at rest on 2 liters Heart S1S2 no rub Abdomen soft nontender nondistended Extremities trace to no edema Psych normal mood and affect Neuro - alert but oriented x 3; provides hx and follows commands Access LUE AVF with bruit and thrill   Medications reviewed   Labs:     Latest Ref Rng & Units 03/20/2024    4:48 AM 03/19/2024    4:04 AM 03/18/2024    4:38 AM  BMP  Glucose 70 - 99 mg/dL 95  94  99   BUN 8 - 23 mg/dL 34  24  38   Creatinine 0.44 - 1.00 mg/dL 2.21  4.14  1.75   Sodium 135 - 145 mmol/L 130  134  137   Potassium 3.5 - 5.1 mmol/L 4.1  3.5  4.2   Chloride 98 - 111 mmol/L 91  92  91   CO2 22 - 32 mmol/L 26  25  28    Calcium  8.9 - 10.3 mg/dL 8.1  8.0  8.5      Assessment/Plan:     # Fall with  Left pubic rami fracture - Per orthopedic surgery - it doesn't appear that she was dizzy prior to her fall - PT has seen   # 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  Will need to coordinate Monday's treatment around her planned surgery  - note findings of possible pulm HTN; UF as tolerated with HD - strict ins/outs and daily weights   # Hypertension - Optimize volume status with HD    #Anemia of CKD - No ESA indicated   # Metabolic bone disease Continue Hectorol  and Sensipar .  On Auryxia  for hyperphosphatemia.   Disposition per primary team.     Katheryn JAYSON Saba, MD 03/20/2024 11:40 AM

## 2024-03-20 NOTE — Plan of Care (Signed)

## 2024-03-20 NOTE — Progress Notes (Signed)
 PT Cancellation Note  Patient Details Name: Gloria Gallagher MRN: [id number] DOB: Jun 23, 1950   Cancelled Treatment:    Reason Eval/Treat Not Completed: Patient declined, no reason specified (Pt polietly declined PT treatment reporting she just didn't feel up to it today. She states she is unsure if it is because she's tired, depressed, didn't sleep well, and/or hasn't eaten yet this morning. Educated pt on the importance of mobilizing daily and getting out of bed to decrease the effects associated with prolonged bed rest. She verbalized understanding and stated she wanted her dialysis ASAP in hopes to make her feel better.) Will follow-up as schedule permits.   Randall SAUNDERS, PT, DPT Acute Rehabilitation Services Office: [phone] Secure Chat Preferred  Delon CHRISTELLA Callander 03/20/2024, 9:20 AM

## 2024-03-20 NOTE — Progress Notes (Signed)
 1.5 liters ultrafiltration, condition stable, and report was given to the RN assuming care of this patient.

## 2024-03-20 NOTE — Progress Notes (Addendum)
 PROGRESS NOTE    Gloria Gallagher  FMW:[id number] DOB: 1951/03/23 DOA: 03/16/2024 PCP: Gloria Reynolds, NP    Brief Narrative:  Gloria Gallagher is a 73 y.o. female with medical history significant for ESRD on HD MWF, PVD, PAF on Eliquis , chronic HFpEF, who presents to the ER after a mechanical fall at home. The patient was attempting to hit a fly, lost her balance and fell, LOC. Level 2 trauma was activated due to the patient being on blood thinners. Report pelvic pain. Left-sided pubic rami fracture and C1-C2 subluxation revealed on CT scan.  Seen by trauma team and orthopedic surgery.  Recommended nonoperative management.  Weightbearing as tolerated.  Return to clinic in 4 weeks for x-ray following discharge.  Pain control.  Discussed the case with neurosurgery, recommended to keep the patient on cervical collar and plan for surgery on 03/23/24     Assessment and Plan: Fall, mechanical, POA Continue pain control and fall precautions Continue to keep the patient on cervical collar as recommended by neurosurgery PT OT evaluation   C1-C2 subluxation, POA Remote ununited displaced type II dens fracture with mild posterior subluxation of C2 relative to the lateral masses of C1, indeterminate acuity given Motion limited study without definitive edema or fluid collection to suggest acute ligamentous injury Neurosurgery consulted, noted unstable fracture, with risk for catastrophic neurologic injury, recommend wearing hard cervical collar at all times for now and plan for surgery on 03/23/24 TTE ordered for preoperative evaluation-showed EF of 60 to 65%, no regional wall motion abnormality, right ventricular systolic function is normal, mildly elevated pulmonary artery systolic pressure Pt ok to mobilize with the hard cervical collar.   Left-sided pubic rami fracture, POA Weightbearing as tolerated PT OT evaluation under the guidance of orthopedic surgery Follow-up outpatient 4 weeks after  discharge for repeat imaging Pain management with Tylenol  scheduled, reports opioids making her nauseous   Paroxysmal A-fib on Eliquis  Resume home amiodarone  for rate control Hold Eliquis  in anticipation for surgery Continue to monitor on telemetry   PVD Resume antiplatelets when okay with orthopedic surgery and neurosurgery. Resume home statin   ESRD on HD MWF Nephrology on board    DVT prophylaxis: SCDs Start: 03/16/24 2326    Code Status: Full Code Family Communication: None at bedside  Disposition Plan:  Level of care: Telemetry Status is: Inpatient     Consultants:  Trauma Ortho Neurosurgery   Subjective: Reports cervical collar uncomfortable.  Discussed once again the importance of having the collar on at all times.  Denies any other new complaints.   Objective: Vitals:   03/20/24 0919 03/20/24 1500 03/20/24 1510 03/20/24 1530  BP: 136/75 (!) 160/60 (!) 154/86 (!) 151/77  Pulse: 80 65 73 68  Resp: 16 16 19 16   Temp: (!) 97.5 F (36.4 C) 98 F (36.7 C)    TempSrc: Oral     SpO2: 94% 99% 100% 100%  Weight:      Height:       No intake or output data in the 24 hours ending 03/20/24 1537  Filed Weights   03/18/24 0601  Weight: 68 kg    Examination: General: NAD, noted cervical collar Cardiovascular: S1, S2 present Respiratory: CTAB Abdomen: Soft, nontender, nondistended, bowel sounds present Musculoskeletal: No bilateral pedal edema noted Skin: Normal Psychiatry: Fair mood       Data Reviewed: I have personally reviewed following labs and imaging studies  CBC: Recent Labs  Lab 03/16/24 1919 03/16/24 1955 03/17/24 0227 03/18/24 9561  03/19/24 0404 03/20/24 0448  WBC 4.3  --  4.7 3.5* 4.4 4.1  NEUTROABS  --   --   --   --  2.9 2.3  HGB 12.9 14.3 11.3* 11.4* 11.0* 11.2*  HCT 41.4 42.0 35.5* 35.8* 35.4* 34.6*  MCV 96.3  --  95.9 95.2 95.4 94.3  PLT 149*  --  122* 126* 134* 134*   Basic Metabolic Panel: Recent Labs  Lab  03/16/24 1919 03/16/24 1955 03/17/24 0227 03/18/24 0438 03/19/24 0404 03/20/24 0448  NA 139 137 136 137 134* 130*  K 4.2 4.1 4.0 4.2 3.5 4.1  CL 95* 99 95* 91* 92* 91*  CO2 28  --  28 28 25 26   GLUCOSE 101* 100* 103* 99 94 95  BUN 24* 27* 28* 38* 24* 34*  CREATININE 5.77* 5.80* 6.13* 8.24* 5.85* 7.78*  CALCIUM  10.2  --  9.4 8.5* 8.0* 8.1*  PHOS  --   --  4.6  --   --   --    GFR: Estimated Creatinine Clearance: 5.7 mL/min (A) (by C-G formula based on SCr of 7.78 mg/dL (H)). Liver Function Tests: Recent Labs  Lab 03/16/24 1919 03/17/24 0227  AST 22  --   ALT 14  --   ALKPHOS 170*  --   BILITOT 0.8  --   PROT 7.5  --   ALBUMIN  3.6 3.4*   No results for input(s): LIPASE, AMYLASE in the last 168 hours. No results for input(s): AMMONIA in the last 168 hours. Coagulation Profile: Recent Labs  Lab 03/16/24 1919  INR 1.4*   Cardiac Enzymes: No results for input(s): CKTOTAL, CKMB, CKMBINDEX, TROPONINI in the last 168 hours. BNP (last 3 results) No results for input(s): PROBNP in the last 8760 hours. HbA1C: No results for input(s): HGBA1C in the last 72 hours. CBG: No results for input(s): GLUCAP in the last 168 hours. Lipid Profile: No results for input(s): CHOL, HDL, LDLCALC, TRIG, CHOLHDL, LDLDIRECT in the last 72 hours. Thyroid  Function Tests: No results for input(s): TSH, T4TOTAL, FREET4, T3FREE, THYROIDAB in the last 72 hours. Anemia Panel: No results for input(s): VITAMINB12, FOLATE, FERRITIN, TIBC, IRON , RETICCTPCT in the last 72 hours. Sepsis Labs: Recent Labs  Lab 03/16/24 1955  LATICACIDVEN 2.8*    No results found for this or any previous visit (from the past 240 hours).       Radiology Studies: ECHOCARDIOGRAM COMPLETE Result Date: 03/20/2024    ECHOCARDIOGRAM REPORT   Patient Name:   Gloria Gallagher Date of Exam: 03/20/2024 Medical Rec #:  [id number]         Height:       65.0 in Accession #:     [id number]        Weight:       149.9 lb Date of Birth:  [birth year]        BSA:          1.750 m Patient Age:    72 years          BP:           136/66 mmHg Patient Gender: F                 HR:           62 bpm. Exam Location:  Inpatient Procedure: 2D Echo, Cardiac Doppler and Color Doppler (Both Spectral and Color            Flow Doppler were utilized during procedure). Indications:  Pre Op Z01.810  History:        Patient has prior history of Echocardiogram examinations, most                 recent 10/02/2022. H/O Cardiac Tamponade, CAD, Aortic Valve                 Disease, Arrythmias:Atrial Fibrillation,                 Signs/Symptoms:Shortness of Breath and Murmur; Risk                 Factors:Hypertension.  Sonographer:    BERNARDA ROCKS Referring Phys: [id number] Yarrow Linhart J Letrell Attwood IMPRESSIONS  1. Left ventricular ejection fraction, by estimation, is 60 to 65%. The left ventricle has normal function. The left ventricle has no regional wall motion abnormalities. The left ventricular internal cavity size was dilated.  2. Right ventricular systolic function is normal. The right ventricular size is normal. Mildly increased right ventricular wall thickness. There is mildly elevated pulmonary artery systolic pressure. The estimated right ventricular systolic pressure is 37.4 mmHg.  3. Left atrial size was severely dilated.  4. Right atrial size was severely dilated.  5. The mitral valve is degenerative. Moderate mitral valve regurgitation. No evidence of mitral stenosis. Moderate to severe mitral annular calcification.  6. The aortic valve is tricuspid. Aortic valve regurgitation is mild to moderate. Aortic valve sclerosis is present, with no evidence of aortic valve stenosis.  7. Aortic dilatation noted. There is dilatation of the aortic root, measuring 39 mm. There is dilatation of the ascending aorta, measuring 45 mm.  8. The inferior vena cava is normal in size with <50% respiratory variability, suggesting  right atrial pressure of 8 mmHg. Comparison(s): A prior study was performed on 10/02/2022. LVEF 60-65%, mild LVH, moderate biatrial enlargement, large pericardial effusion with tamponade physiology, marked cystic changes in the left lobe of the liver and there is evidence of severe polycystic changes of the neighboring kidney tissue, aortic root 41 mm, ascending aorta 47 mm, estimated RAP 15 mmHg. FINDINGS  Left Ventricle: Left ventricular ejection fraction, by estimation, is 60 to 65%. The left ventricle has normal function. The left ventricle has no regional wall motion abnormalities. The left ventricular internal cavity size was dilated. There is no left ventricular hypertrophy. Left ventricular diastolic function could not be evaluated due to mitral annular calcification (moderate or greater). Right Ventricle: The right ventricular size is normal. Mildly increased right ventricular wall thickness. Right ventricular systolic function is normal. There is mildly elevated pulmonary artery systolic pressure. The tricuspid regurgitant velocity is 2.71 m/s, and with an assumed right atrial pressure of 8 mmHg, the estimated right ventricular systolic pressure is 37.4 mmHg. Left Atrium: Left atrial size was severely dilated. Right Atrium: Right atrial size was severely dilated. Pericardium: There is no evidence of pericardial effusion. Mitral Valve: The mitral valve is degenerative in appearance. Moderate to severe mitral annular calcification. Moderate mitral valve regurgitation. No evidence of mitral valve stenosis. MV peak gradient, 8.5 mmHg. The mean mitral valve gradient is 3.0 mmHg. Tricuspid Valve: The tricuspid valve is normal in structure. Tricuspid valve regurgitation is mild . No evidence of tricuspid stenosis. Aortic Valve: The aortic valve is tricuspid. Aortic valve regurgitation is mild to moderate. Aortic regurgitation PHT measures 135 msec. Aortic valve sclerosis is present, with no evidence of aortic  valve stenosis. Aortic valve mean gradient measures 5.0  mmHg. Aortic valve peak gradient measures 13.1 mmHg. Aortic valve  area, by VTI measures 3.03 cm. Pulmonic Valve: The pulmonic valve was normal in structure. Pulmonic valve regurgitation is mild. No evidence of pulmonic stenosis. Aorta: Aortic dilatation noted. There is dilatation of the aortic root, measuring 39 mm. There is dilatation of the ascending aorta, measuring 45 mm. Venous: The inferior vena cava is normal in size with less than 50% respiratory variability, suggesting right atrial pressure of 8 mmHg. IAS/Shunts: The atrial septum is grossly normal.  LEFT VENTRICLE PLAX 2D LVIDd:         5.80 cm      Diastology LVIDs:         3.40 cm      LV e' medial:    10.90 cm/s LV PW:         1.00 cm      LV E/e' medial:  12.3 LV IVS:        1.00 cm      LV e' lateral:   14.90 cm/s LVOT diam:     2.00 cm      LV E/e' lateral: 9.0 LV SV:         99 LV SV Index:   57 LVOT Area:     3.14 cm  LV Volumes (MOD) LV vol d, MOD A2C: 215.0 ml LV vol d, MOD A4C: 224.0 ml LV vol s, MOD A2C: 76.1 ml LV vol s, MOD A4C: 78.5 ml LV SV MOD A2C:     138.9 ml LV SV MOD A4C:     224.0 ml LV SV MOD BP:      142.1 ml RIGHT VENTRICLE             IVC RV Basal diam:  4.60 cm     IVC diam: 1.80 cm RV S prime:     14.90 cm/s TAPSE (M-mode): 2.0 cm RVSP:           32.4 mmHg LEFT ATRIUM              Index        RIGHT ATRIUM           Index LA diam:        5.40 cm  3.09 cm/m   RA Pressure: 3.00 mmHg LA Vol (A2C):   164.0 ml 93.71 ml/m  RA Area:     31.20 cm LA Vol (A4C):   170.0 ml 97.14 ml/m  RA Volume:   124.00 ml 70.86 ml/m LA Biplane Vol: 173.0 ml 98.85 ml/m  AORTIC VALVE                     PULMONIC VALVE AV Area (Vmax):    2.97 cm      PV Vmax:       1.55 m/s AV Area (Vmean):   2.79 cm      PV Peak grad:  9.6 mmHg AV Area (VTI):     3.03 cm AV Vmax:           181.00 cm/s AV Vmean:          103.000 cm/s AV VTI:            0.327 m AV Peak Grad:      13.1 mmHg AV Mean Grad:       5.0 mmHg LVOT Vmax:         171.00 cm/s LVOT Vmean:        91.400 cm/s LVOT VTI:  0.315 m LVOT/AV VTI ratio: 0.96 AI PHT:            135 msec  AORTA Ao Root diam: 3.90 cm Ao Asc diam:  4.50 cm MITRAL VALVE                TRICUSPID VALVE MV Area (PHT): 4.77 cm     TR Peak grad:   29.4 mmHg MV Area VTI:   3.64 cm     TR Mean grad:   29.0 mmHg MV Peak grad:  8.5 mmHg     TR Vmax:        271.00 cm/s MV Mean grad:  3.0 mmHg     Estimated RAP:  3.00 mmHg MV Vmax:       1.46 m/s     RVSP:           32.4 mmHg MV Vmean:      75.3 cm/s MV Decel Time: 159 msec     SHUNTS MR Peak grad: 106.5 mmHg    Systemic VTI:  0.32 m MR Mean grad: 106.0 mmHg    Systemic Diam: 2.00 cm MR Vmax:      516.00 cm/s MV E velocity: 134.00 cm/s MV A velocity: 109.00 cm/s MV E/A ratio:  1.23 Sunit Tolia Electronically signed by [employer] Signature Date/Time: 03/20/2024/1:44:06 PM    Final          Scheduled Meds:  (feeding supplement) PROSource Plus  30 mL Oral BID BM   acetaminophen   1,000 mg Oral Q8H   amiodarone   200 mg Oral Daily   atorvastatin   20 mg Oral QPM   Chlorhexidine  Gluconate Cloth  6 each Topical Q0600   cinacalcet   90 mg Oral QPM   doxercalciferol   4 mcg Intravenous Q M,W,F-HD   ferric citrate   420 mg Oral TID WC   methocarbamol  1,000 mg Oral Q8H   multivitamin  1 tablet Oral QHS   Continuous Infusions:   LOS: 4 days        Lebron JINNY Cage, MD [facility] Available via Epic secure chat 7am-7pm After these hours, please refer to coverage provider listed on [email or web address] 03/20/2024, 3:37 PM

## 2024-03-20 NOTE — Progress Notes (Signed)
   Providing Compassionate, Quality Care - Together   Subjective: Patient reports no new issues. Cardiac sonographer at the bedside for preoperative echocardiogram.  Objective: Vital signs in last 24 hours: Temp:  [97.5 F (36.4 C)-98.8 F (37.1 C)] 97.5 F (36.4 C) (11/07 0919) Pulse Rate:  [75-84] 80 (11/07 0919) Resp:  [14-18] 16 (11/07 0919) BP: (120-144)/(54-75) 136/75 (11/07 0919) SpO2:  [91 %-100 %] 94 % (11/07 0919)  Intake/Output from previous day: No intake/output data recorded. Intake/Output this shift: No intake/output data recorded.  Alert and oriented x 4 PERRLA CN II-XII grossly intact MAE Hard collar in place.  Lab Results: Recent Labs    03/19/24 0404 03/20/24 0448  WBC 4.4 4.1  HGB 11.0* 11.2*  HCT 35.4* 34.6*  PLT 134* 134*   BMET Recent Labs    03/19/24 0404 03/20/24 0448  NA 134* 130*  K 3.5 4.1  CL 92* 91*  CO2 25 26  GLUCOSE 94 95  BUN 24* 34*  CREATININE 5.85* 7.78*  CALCIUM  8.0* 8.1*    Studies/Results: No results found.  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 Plan is for C1-2 posterior cervical fusion with Dr. Louis on Monday. Continue hard cervical collar at all times.   LOS: 4 days    I am in communication with my attending and they agree with the plan for this patient.   Gerard Beck, DNP, AGNP-C Nurse Practitioner  [facility] [address], [address], [place], [place] [address] P: [phone]    F: [phone]  03/20/2024, 10:30 AM

## 2024-03-20 NOTE — Plan of Care (Signed)
 Gave PRN pain med x1. Needs attended. Call bell within reached. Bed alarm on Problem: Education: Goal: Knowledge of General Education information will improve Description: Including pain rating scale, medication(s)/side effects and non-pharmacologic comfort measures Outcome: Progressing   Problem: Health Behavior/Discharge Planning: Goal: Ability to manage health-related needs will improve Outcome: Progressing   Problem: Clinical Measurements: Goal: Ability to maintain clinical measurements within normal limits will improve Outcome: Progressing Goal: Will remain free from infection Outcome: Progressing Goal: Diagnostic test results will improve Outcome: Progressing Goal: Respiratory complications will improve Outcome: Progressing Goal: Cardiovascular complication will be avoided Outcome: Progressing   Problem: Activity: Goal: Risk for activity intolerance will decrease Outcome: Progressing   Problem: Nutrition: Goal: Adequate nutrition will be maintained Outcome: Progressing   Problem: Coping: Goal: Level of anxiety will decrease Outcome: Progressing   Problem: Elimination: Goal: Will not experience complications related to bowel motility Outcome: Progressing Goal: Will not experience complications related to urinary retention Outcome: Progressing   Problem: Pain Managment: Goal: General experience of comfort will improve and/or be controlled Outcome: Progressing   Problem: Safety: Goal: Ability to remain free from injury will improve Outcome: Progressing   Problem: Skin Integrity: Goal: Risk for impaired skin integrity will decrease Outcome: Progressing

## 2024-03-21 DIAGNOSIS — N186 End stage renal disease: Secondary | ICD-10-CM | POA: Diagnosis not present

## 2024-03-21 DIAGNOSIS — W19XXXA Unspecified fall, initial encounter: Secondary | ICD-10-CM | POA: Diagnosis not present

## 2024-03-21 LAB — BASIC METABOLIC PANEL WITH GFR
Anion gap: 16 — ABNORMAL HIGH (ref 5–15)
BUN: 21 mg/dL (ref 8–23)
CO2: 27 mmol/L (ref 22–32)
Calcium: 8.7 mg/dL — ABNORMAL LOW (ref 8.9–10.3)
Chloride: 90 mmol/L — ABNORMAL LOW (ref 98–111)
Creatinine, Ser: 5.12 mg/dL — ABNORMAL HIGH (ref 0.44–1.00)
GFR, Estimated: 8 mL/min — ABNORMAL LOW (ref >60)
Glucose, Bld: 83 mg/dL (ref 70–99)
Potassium: 3.6 mmol/L (ref 3.5–5.1)
Sodium: 133 mmol/L — ABNORMAL LOW (ref 135–145)

## 2024-03-21 MED ORDER — HYDRALAZINE HCL 20 MG/ML IJ SOLN: 5.0000 mg | Freq: Three times a day (TID) | INTRAMUSCULAR | Status: DC | PRN

## 2024-03-21 NOTE — Progress Notes (Signed)
 [facility] Progress Note  Name: Gloria Gallagher MRN: [id number] DOB: [birth year]  Subjective:  Had HD here Wed/ Fri w/ 4 L off in total Neurosurgery saw her and recommending neck surgery for Monday not on oxygen  at home and has been on 2 liters here   Pt is w/o c/o's today  Vitals:  Vitals:   03/20/24 1855 03/20/24 2120 03/21/24 0518 03/21/24 0958  BP: (!) 148/78 (!) 105/58 (!) 162/96 (!) 160/68  Pulse: 81 99 87 74  Resp: 16   16  Temp: 97.7 F (36.5 C) 98 F (36.7 C) 97.7 F (36.5 C) 97.6 F (36.4 C)  TempSrc:  Oral Oral Oral  SpO2:  96% 100% 92%  Weight: 64.7 kg     Height:         Physical Exam:    General adult female in bed in no acute distress  Neck in c-collar Lungs CTA bilat, 2L [place] O2 Heart S1S2 no rub Abdomen soft nontender nondistended Ext: no edema Neuro: alert and Ox3  Access: LUE AVF+b/t    OP HD: [place] M[facility] 3h  B400   66.4kg    2K bath  AVF  Heparin  none Very good compliance Gets to dry wt, ave wg= 1- 3kg    Assessment/Plan:   # Fall with Left pubic rami fracture - Per orthopedic surgery - PT has seen   # 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  - neurosurgery has seen  - plans for for C1-C2 posterior cervical fusion on Monday 11/10   # ESRD - HD MWF - note findings of possible pulm HTN; UF as tolerated with HD - schedule around neck surgery on 11/10  # Hypertension/ volume - admit 11/03 cxr was clear, also CT chest same date w/ no edema  - close to dry wt - optimize volume status with HD    #Anemia of CKD - No ESA indicated   # Metabolic bone disease - continue Hectorol  and Sensipar  - auryxia  for hyperphosphatemia   Rob Eiza Canniff  MD  [facility] 03/21/2024, 1:09 PM  Recent Labs  Lab 03/16/24 1919 03/16/24 1955 03/17/24 0227 03/18/24 0438 03/19/24 0404 03/20/24 0448 03/21/24 0541  HGB 12.9   < > 11.3*   < > 11.0* 11.2*  --   ALBUMIN  3.6  --  3.4*  --   --   --   --   CALCIUM  10.2  --  9.4   < >  8.0* 8.1* 8.7*  PHOS  --   --  4.6  --   --   --   --   CREATININE 5.77*   < > 6.13*   < > 5.85* 7.78* 5.12*  K 4.2   < > 4.0   < > 3.5 4.1 3.6   < > = values in this interval not displayed.    Inpatient medications:  (feeding supplement) PROSource Plus  30 mL Oral BID BM   acetaminophen   1,000 mg Oral Q8H   amiodarone   200 mg Oral Daily   atorvastatin   20 mg Oral QPM   Chlorhexidine  Gluconate Cloth  6 each Topical Q0600   cinacalcet   90 mg Oral QPM   doxercalciferol   4 mcg Intravenous Q M,W,F-HD   ferric citrate   420 mg Oral TID WC   methocarbamol  1,000 mg Oral Q8H   multivitamin  1 tablet Oral QHS    hydrALAZINE , oxyCODONE , prochlorperazine

## 2024-03-21 NOTE — Plan of Care (Signed)
   Problem: Education: Goal: Knowledge of General Education information will improve Description: Including pain rating scale, medication(s)/side effects and non-pharmacologic comfort measures Outcome: Progressing   Problem: Coping: Goal: Level of anxiety will decrease Outcome: Progressing

## 2024-03-21 NOTE — Progress Notes (Signed)
 PROGRESS NOTE    Gloria Gallagher  FMW:[id number] DOB: 03-23-1951 DOA: 03/16/2024 PCP: Campbell Reynolds, NP    Brief Narrative:  Gloria Gallagher is a 73 y.o. female with medical history significant for ESRD on HD MWF, PVD, PAF on Eliquis , chronic HFpEF, who presents to the ER after a mechanical fall at home. The patient was attempting to hit a fly, lost her balance and fell, LOC. Level 2 trauma was activated due to the patient being on blood thinners. Report pelvic pain. Left-sided pubic rami fracture and C1-C2 subluxation revealed on CT scan.  Seen by trauma team and orthopedic surgery.  Recommended nonoperative management.  Weightbearing as tolerated.  Return to clinic in 4 weeks for x-ray following discharge.  Pain control.  Discussed the case with neurosurgery, recommended to keep the patient on cervical collar and plan for surgery on 03/23/24     Assessment and Plan: Fall, mechanical, POA Continue pain control and fall precautions Continue to keep the patient on cervical collar as recommended by neurosurgery PT OT evaluation   C1-C2 subluxation, POA Remote ununited displaced type II dens fracture with mild posterior subluxation of C2 relative to the lateral masses of C1, indeterminate acuity given Motion limited study without definitive edema or fluid collection to suggest acute ligamentous injury Neurosurgery consulted, noted unstable fracture, with risk for catastrophic neurologic injury, recommend wearing hard cervical collar at all times for now and plan for surgery on 03/23/24 TTE ordered for preoperative evaluation-showed EF of 60 to 65%, no regional wall motion abnormality, right ventricular systolic function is normal, mildly elevated pulmonary artery systolic pressure Pt ok to mobilize with the hard cervical collar.   Left-sided pubic rami fracture, POA Weightbearing as tolerated PT OT evaluation under the guidance of orthopedic surgery Follow-up outpatient 4 weeks after  discharge for repeat imaging Pain management with Tylenol  scheduled, reports opioids making her nauseous   Paroxysmal A-fib on Eliquis  Resume home amiodarone  for rate control Hold Eliquis  in anticipation for surgery Continue to monitor on telemetry   PVD Resume antiplatelets when okay with orthopedic surgery and neurosurgery. Resume home statin   ESRD on HD MWF Nephrology on board    DVT prophylaxis: SCDs Start: 03/16/24 2326    Code Status: Full Code Family Communication: None at bedside  Disposition Plan:  Level of care: Telemetry Status is: Inpatient     Consultants:  Trauma Ortho Neurosurgery   Subjective: Denies any new complaints.   Objective: Vitals:   03/20/24 1855 03/20/24 2120 03/21/24 0518 03/21/24 0958  BP: (!) 148/78 (!) 105/58 (!) 162/96 (!) 160/68  Pulse: 81 99 87 74  Resp: 16   16  Temp: 97.7 F (36.5 C) 98 F (36.7 C) 97.7 F (36.5 C) 97.6 F (36.4 C)  TempSrc:  Oral Oral Oral  SpO2:  96% 100% 92%  Weight: 64.7 kg     Height:        Intake/Output Summary (Last 24 hours) at 03/21/2024 1438 Last data filed at 03/20/2024 1855 Gross per 24 hour  Intake --  Output 1500 ml  Net -1500 ml    Filed Weights   03/18/24 0601 03/20/24 1855  Weight: 68 kg 64.7 kg    Examination: General: NAD, noted cervical collar Cardiovascular: S1, S2 present Respiratory: CTAB Abdomen: Soft, nontender, nondistended, bowel sounds present Musculoskeletal: No bilateral pedal edema noted Skin: Normal Psychiatry: Fair mood       Data Reviewed: I have personally reviewed following labs and imaging studies  CBC: Recent  Labs  Lab 03/16/24 1919 03/16/24 1955 03/17/24 0227 03/18/24 0438 03/19/24 0404 03/20/24 0448  WBC 4.3  --  4.7 3.5* 4.4 4.1  NEUTROABS  --   --   --   --  2.9 2.3  HGB 12.9 14.3 11.3* 11.4* 11.0* 11.2*  HCT 41.4 42.0 35.5* 35.8* 35.4* 34.6*  MCV 96.3  --  95.9 95.2 95.4 94.3  PLT 149*  --  122* 126* 134* 134*   Basic  Metabolic Panel: Recent Labs  Lab 03/17/24 0227 03/18/24 0438 03/19/24 0404 03/20/24 0448 03/21/24 0541  NA 136 137 134* 130* 133*  K 4.0 4.2 3.5 4.1 3.6  CL 95* 91* 92* 91* 90*  CO2 28 28 25 26 27   GLUCOSE 103* 99 94 95 83  BUN 28* 38* 24* 34* 21  CREATININE 6.13* 8.24* 5.85* 7.78* 5.12*  CALCIUM  9.4 8.5* 8.0* 8.1* 8.7*  PHOS 4.6  --   --   --   --    GFR: Estimated Creatinine Clearance: 8.7 mL/min (A) (by C-G formula based on SCr of 5.12 mg/dL (H)). Liver Function Tests: Recent Labs  Lab 03/16/24 1919 03/17/24 0227  AST 22  --   ALT 14  --   ALKPHOS 170*  --   BILITOT 0.8  --   PROT 7.5  --   ALBUMIN  3.6 3.4*   No results for input(s): LIPASE, AMYLASE in the last 168 hours. No results for input(s): AMMONIA in the last 168 hours. Coagulation Profile: Recent Labs  Lab 03/16/24 1919  INR 1.4*   Cardiac Enzymes: No results for input(s): CKTOTAL, CKMB, CKMBINDEX, TROPONINI in the last 168 hours. BNP (last 3 results) No results for input(s): PROBNP in the last 8760 hours. HbA1C: No results for input(s): HGBA1C in the last 72 hours. CBG: No results for input(s): GLUCAP in the last 168 hours. Lipid Profile: No results for input(s): CHOL, HDL, LDLCALC, TRIG, CHOLHDL, LDLDIRECT in the last 72 hours. Thyroid  Function Tests: No results for input(s): TSH, T4TOTAL, FREET4, T3FREE, THYROIDAB in the last 72 hours. Anemia Panel: No results for input(s): VITAMINB12, FOLATE, FERRITIN, TIBC, IRON , RETICCTPCT in the last 72 hours. Sepsis Labs: Recent Labs  Lab 03/16/24 1955  LATICACIDVEN 2.8*    No results found for this or any previous visit (from the past 240 hours).       Radiology Studies: ECHOCARDIOGRAM COMPLETE Result Date: 03/20/2024    ECHOCARDIOGRAM REPORT   Patient Name:   Gloria Gallagher Date of Exam: 03/20/2024 Medical Rec #:  [id number]         Height:       65.0 in Accession #:    [id number]         Weight:       149.9 lb Date of Birth:  1950-05-16        BSA:          1.750 m Patient Age:    72 years          BP:           136/66 mmHg Patient Gender: F                 HR:           62 bpm. Exam Location:  Inpatient Procedure: 2D Echo, Cardiac Doppler and Color Doppler (Both Spectral and Color            Flow Doppler were utilized during procedure). Indications:    Pre Op Z01.810  History:        Patient has prior history of Echocardiogram examinations, most                 recent 10/02/2022. H/O Cardiac Tamponade, CAD, Aortic Valve                 Disease, Arrythmias:Atrial Fibrillation,                 Signs/Symptoms:Shortness of Breath and Murmur; Risk                 Factors:Hypertension.  Sonographer:    BERNARDA ROCKS Referring Phys: [id number] Shameek Nyquist J Genesee Nase IMPRESSIONS  1. Left ventricular ejection fraction, by estimation, is 60 to 65%. The left ventricle has normal function. The left ventricle has no regional wall motion abnormalities. The left ventricular internal cavity size was dilated.  2. Right ventricular systolic function is normal. The right ventricular size is normal. Mildly increased right ventricular wall thickness. There is mildly elevated pulmonary artery systolic pressure. The estimated right ventricular systolic pressure is 37.4 mmHg.  3. Left atrial size was severely dilated.  4. Right atrial size was severely dilated.  5. The mitral valve is degenerative. Moderate mitral valve regurgitation. No evidence of mitral stenosis. Moderate to severe mitral annular calcification.  6. The aortic valve is tricuspid. Aortic valve regurgitation is mild to moderate. Aortic valve sclerosis is present, with no evidence of aortic valve stenosis.  7. Aortic dilatation noted. There is dilatation of the aortic root, measuring 39 mm. There is dilatation of the ascending aorta, measuring 45 mm.  8. The inferior vena cava is normal in size with <50% respiratory variability, suggesting right atrial pressure  of 8 mmHg. Comparison(s): A prior study was performed on 10/02/2022. LVEF 60-65%, mild LVH, moderate biatrial enlargement, large pericardial effusion with tamponade physiology, marked cystic changes in the left lobe of the liver and there is evidence of severe polycystic changes of the neighboring kidney tissue, aortic root 41 mm, ascending aorta 47 mm, estimated RAP 15 mmHg. FINDINGS  Left Ventricle: Left ventricular ejection fraction, by estimation, is 60 to 65%. The left ventricle has normal function. The left ventricle has no regional wall motion abnormalities. The left ventricular internal cavity size was dilated. There is no left ventricular hypertrophy. Left ventricular diastolic function could not be evaluated due to mitral annular calcification (moderate or greater). Right Ventricle: The right ventricular size is normal. Mildly increased right ventricular wall thickness. Right ventricular systolic function is normal. There is mildly elevated pulmonary artery systolic pressure. The tricuspid regurgitant velocity is 2.71 m/s, and with an assumed right atrial pressure of 8 mmHg, the estimated right ventricular systolic pressure is 37.4 mmHg. Left Atrium: Left atrial size was severely dilated. Right Atrium: Right atrial size was severely dilated. Pericardium: There is no evidence of pericardial effusion. Mitral Valve: The mitral valve is degenerative in appearance. Moderate to severe mitral annular calcification. Moderate mitral valve regurgitation. No evidence of mitral valve stenosis. MV peak gradient, 8.5 mmHg. The mean mitral valve gradient is 3.0 mmHg. Tricuspid Valve: The tricuspid valve is normal in structure. Tricuspid valve regurgitation is mild . No evidence of tricuspid stenosis. Aortic Valve: The aortic valve is tricuspid. Aortic valve regurgitation is mild to moderate. Aortic regurgitation PHT measures 135 msec. Aortic valve sclerosis is present, with no evidence of aortic valve stenosis. Aortic  valve mean gradient measures 5.0  mmHg. Aortic valve peak gradient measures 13.1 mmHg. Aortic valve area, by VTI measures  3.03 cm. Pulmonic Valve: The pulmonic valve was normal in structure. Pulmonic valve regurgitation is mild. No evidence of pulmonic stenosis. Aorta: Aortic dilatation noted. There is dilatation of the aortic root, measuring 39 mm. There is dilatation of the ascending aorta, measuring 45 mm. Venous: The inferior vena cava is normal in size with less than 50% respiratory variability, suggesting right atrial pressure of 8 mmHg. IAS/Shunts: The atrial septum is grossly normal.  LEFT VENTRICLE PLAX 2D LVIDd:         5.80 cm      Diastology LVIDs:         3.40 cm      LV e' medial:    10.90 cm/s LV PW:         1.00 cm      LV E/e' medial:  12.3 LV IVS:        1.00 cm      LV e' lateral:   14.90 cm/s LVOT diam:     2.00 cm      LV E/e' lateral: 9.0 LV SV:         99 LV SV Index:   57 LVOT Area:     3.14 cm  LV Volumes (MOD) LV vol d, MOD A2C: 215.0 ml LV vol d, MOD A4C: 224.0 ml LV vol s, MOD A2C: 76.1 ml LV vol s, MOD A4C: 78.5 ml LV SV MOD A2C:     138.9 ml LV SV MOD A4C:     224.0 ml LV SV MOD BP:      142.1 ml RIGHT VENTRICLE             IVC RV Basal diam:  4.60 cm     IVC diam: 1.80 cm RV S prime:     14.90 cm/s TAPSE (M-mode): 2.0 cm RVSP:           32.4 mmHg LEFT ATRIUM              Index        RIGHT ATRIUM           Index LA diam:        5.40 cm  3.09 cm/m   RA Pressure: 3.00 mmHg LA Vol (A2C):   164.0 ml 93.71 ml/m  RA Area:     31.20 cm LA Vol (A4C):   170.0 ml 97.14 ml/m  RA Volume:   124.00 ml 70.86 ml/m LA Biplane Vol: 173.0 ml 98.85 ml/m  AORTIC VALVE                     PULMONIC VALVE AV Area (Vmax):    2.97 cm      PV Vmax:       1.55 m/s AV Area (Vmean):   2.79 cm      PV Peak grad:  9.6 mmHg AV Area (VTI):     3.03 cm AV Vmax:           181.00 cm/s AV Vmean:          103.000 cm/s AV VTI:            0.327 m AV Peak Grad:      13.1 mmHg AV Mean Grad:      5.0 mmHg LVOT Vmax:          171.00 cm/s LVOT Vmean:        91.400 cm/s LVOT VTI:          0.315 m LVOT/AV VTI  ratio: 0.96 AI PHT:            135 msec  AORTA Ao Root diam: 3.90 cm Ao Asc diam:  4.50 cm MITRAL VALVE                TRICUSPID VALVE MV Area (PHT): 4.77 cm     TR Peak grad:   29.4 mmHg MV Area VTI:   3.64 cm     TR Mean grad:   29.0 mmHg MV Peak grad:  8.5 mmHg     TR Vmax:        271.00 cm/s MV Mean grad:  3.0 mmHg     Estimated RAP:  3.00 mmHg MV Vmax:       1.46 m/s     RVSP:           32.4 mmHg MV Vmean:      75.3 cm/s MV Decel Time: 159 msec     SHUNTS MR Peak grad: 106.5 mmHg    Systemic VTI:  0.32 m MR Mean grad: 106.0 mmHg    Systemic Diam: 2.00 cm MR Vmax:      516.00 cm/s MV E velocity: 134.00 cm/s MV A velocity: 109.00 cm/s MV E/A ratio:  1.23 Sunit Tolia Electronically signed by [employer] Signature Date/Time: 03/20/2024/1:44:06 PM    Final          Scheduled Meds:  (feeding supplement) PROSource Plus  30 mL Oral BID BM   acetaminophen   1,000 mg Oral Q8H   amiodarone   200 mg Oral Daily   atorvastatin   20 mg Oral QPM   Chlorhexidine  Gluconate Cloth  6 each Topical Q0600   cinacalcet   90 mg Oral QPM   doxercalciferol   4 mcg Intravenous Q M,W,F-HD   ferric citrate   420 mg Oral TID WC   methocarbamol  1,000 mg Oral Q8H   multivitamin  1 tablet Oral QHS   Continuous Infusions:   LOS: 5 days        Lebron JINNY Cage, MD [facility] Available via Epic secure chat 7am-7pm After these hours, please refer to coverage provider listed on [email or web address] 03/21/2024, 2:38 PM

## 2024-03-21 NOTE — Progress Notes (Signed)
 Overall stable.  Pain under good control.  No new neurologic symptoms.  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  I have once again discussed the need for surgical intervention for reduction of her fracture with fixation and fusion in hopes of improving her symptoms.  We discussed the risks and benefits of surgery.  Plan for surgery Monday afternoon.

## 2024-03-21 NOTE — Plan of Care (Addendum)

## 2024-03-22 DIAGNOSIS — N186 End stage renal disease: Secondary | ICD-10-CM | POA: Diagnosis not present

## 2024-03-22 DIAGNOSIS — W19XXXA Unspecified fall, initial encounter: Secondary | ICD-10-CM | POA: Diagnosis not present

## 2024-03-22 LAB — BASIC METABOLIC PANEL WITH GFR
Anion gap: 17 — ABNORMAL HIGH (ref 5–15)
BUN: 28 mg/dL — ABNORMAL HIGH (ref 8–23)
CO2: 23 mmol/L (ref 22–32)
Calcium: 8.3 mg/dL — ABNORMAL LOW (ref 8.9–10.3)
Chloride: 89 mmol/L — ABNORMAL LOW (ref 98–111)
Creatinine, Ser: 6.67 mg/dL — ABNORMAL HIGH (ref 0.44–1.00)
GFR, Estimated: 6 mL/min — ABNORMAL LOW (ref >60)
Glucose, Bld: 88 mg/dL (ref 70–99)
Potassium: 4.3 mmol/L (ref 3.5–5.1)
Sodium: 129 mmol/L — ABNORMAL LOW (ref 135–145)

## 2024-03-22 MED ORDER — VANCOMYCIN HCL IN DEXTROSE 1-5 GM/200ML-% IV SOLN
1000.0000 mg | INTRAVENOUS | Status: AC
  Filled 2024-03-22: qty 200

## 2024-03-22 MED ORDER — CHLORHEXIDINE GLUCONATE CLOTH 2 % EX PADS
6.0000 | MEDICATED_PAD | Freq: Every day | CUTANEOUS | Status: DC
  Administered 2024-03-23 – 2024-03-26 (×4): 6 via TOPICAL

## 2024-03-22 MED ORDER — CHLORHEXIDINE GLUCONATE CLOTH 2 % EX PADS
6.0000 | MEDICATED_PAD | Freq: Once | CUTANEOUS | Status: AC
  Administered 2024-03-22: 6 via TOPICAL

## 2024-03-22 MED ORDER — CHLORHEXIDINE GLUCONATE CLOTH 2 % EX PADS
6.0000 | MEDICATED_PAD | Freq: Once | CUTANEOUS | Status: AC
  Administered 2024-03-23: 6 via TOPICAL

## 2024-03-22 NOTE — Progress Notes (Signed)
 Assessment Pt with unstable C2 fracture  LOS: 6 days    Plan: OR tomorrow for C1-2 posterior fusion NPO@MN   Subjective: Pt had no questions about surgery  Objective: Vital signs in last 24 hours: Temp:  [97.6 F (36.4 C)-98.3 F (36.8 C)] 97.7 F (36.5 C) (11/09 0300) Pulse Rate:  [68-99] 99 (11/08 2052) Resp:  [15-16] 16 (11/08 2052) BP: (140-160)/(63-68) 151/63 (11/09 0300) SpO2:  [92 %-100 %] 100 % (11/08 2052)  Intake/Output from previous day: 11/08 0701 - 11/09 0700 In: 240 [P.O.:240] Out: 0  Intake/Output this shift: No intake/output data recorded.  Exam: MAEx4 AOx3  Lab Results: Recent Labs    03/20/24 0448  WBC 4.1  HGB 11.2*  HCT 34.6*  PLT 134*   BMET Recent Labs    03/21/24 0541 03/22/24 0503  NA 133* 129*  K 3.6 4.3  CL 90* 89*  CO2 27 23  GLUCOSE 83 88  BUN 21 28*  CREATININE 5.12* 6.67*  CALCIUM  8.7* 8.3*    Studies/Results: ECHOCARDIOGRAM COMPLETE Result Date: 03/20/2024    ECHOCARDIOGRAM REPORT   Patient Name:   Gloria Gallagher Date of Exam: 03/20/2024 Medical Rec #:  [id number]         Height:       65.0 in Accession #:    [id number]        Weight:       149.9 lb Date of Birth:  1950-06-28        BSA:          1.750 m Patient Age:    72 years          BP:           136/66 mmHg Patient Gender: F                 HR:           62 bpm. Exam Location:  Inpatient Procedure: 2D Echo, Cardiac Doppler and Color Doppler (Both Spectral and Color            Flow Doppler were utilized during procedure). Indications:    Pre Op Z01.810  History:        Patient has prior history of Echocardiogram examinations, most                 recent 10/02/2022. H/O Cardiac Tamponade, CAD, Aortic Valve                 Disease, Arrythmias:Atrial Fibrillation,                 Signs/Symptoms:Shortness of Breath and Murmur; Risk                 Factors:Hypertension.  Sonographer:    BERNARDA ROCKS Referring Phys: [id number] NKEIRUKA J EZENDUKA IMPRESSIONS  1. Left ventricular  ejection fraction, by estimation, is 60 to 65%. The left ventricle has normal function. The left ventricle has no regional wall motion abnormalities. The left ventricular internal cavity size was dilated.  2. Right ventricular systolic function is normal. The right ventricular size is normal. Mildly increased right ventricular wall thickness. There is mildly elevated pulmonary artery systolic pressure. The estimated right ventricular systolic pressure is 37.4 mmHg.  3. Left atrial size was severely dilated.  4. Right atrial size was severely dilated.  5. The mitral valve is degenerative. Moderate mitral valve regurgitation. No evidence of mitral stenosis. Moderate to severe mitral annular calcification.  6. The aortic valve is tricuspid. Aortic  valve regurgitation is mild to moderate. Aortic valve sclerosis is present, with no evidence of aortic valve stenosis.  7. Aortic dilatation noted. There is dilatation of the aortic root, measuring 39 mm. There is dilatation of the ascending aorta, measuring 45 mm.  8. The inferior vena cava is normal in size with <50% respiratory variability, suggesting right atrial pressure of 8 mmHg. Comparison(s): A prior study was performed on 10/02/2022. LVEF 60-65%, mild LVH, moderate biatrial enlargement, large pericardial effusion with tamponade physiology, marked cystic changes in the left lobe of the liver and there is evidence of severe polycystic changes of the neighboring kidney tissue, aortic root 41 mm, ascending aorta 47 mm, estimated RAP 15 mmHg. FINDINGS  Left Ventricle: Left ventricular ejection fraction, by estimation, is 60 to 65%. The left ventricle has normal function. The left ventricle has no regional wall motion abnormalities. The left ventricular internal cavity size was dilated. There is no left ventricular hypertrophy. Left ventricular diastolic function could not be evaluated due to mitral annular calcification (moderate or greater). Right Ventricle: The right  ventricular size is normal. Mildly increased right ventricular wall thickness. Right ventricular systolic function is normal. There is mildly elevated pulmonary artery systolic pressure. The tricuspid regurgitant velocity is 2.71 m/s, and with an assumed right atrial pressure of 8 mmHg, the estimated right ventricular systolic pressure is 37.4 mmHg. Left Atrium: Left atrial size was severely dilated. Right Atrium: Right atrial size was severely dilated. Pericardium: There is no evidence of pericardial effusion. Mitral Valve: The mitral valve is degenerative in appearance. Moderate to severe mitral annular calcification. Moderate mitral valve regurgitation. No evidence of mitral valve stenosis. MV peak gradient, 8.5 mmHg. The mean mitral valve gradient is 3.0 mmHg. Tricuspid Valve: The tricuspid valve is normal in structure. Tricuspid valve regurgitation is mild . No evidence of tricuspid stenosis. Aortic Valve: The aortic valve is tricuspid. Aortic valve regurgitation is mild to moderate. Aortic regurgitation PHT measures 135 msec. Aortic valve sclerosis is present, with no evidence of aortic valve stenosis. Aortic valve mean gradient measures 5.0  mmHg. Aortic valve peak gradient measures 13.1 mmHg. Aortic valve area, by VTI measures 3.03 cm. Pulmonic Valve: The pulmonic valve was normal in structure. Pulmonic valve regurgitation is mild. No evidence of pulmonic stenosis. Aorta: Aortic dilatation noted. There is dilatation of the aortic root, measuring 39 mm. There is dilatation of the ascending aorta, measuring 45 mm. Venous: The inferior vena cava is normal in size with less than 50% respiratory variability, suggesting right atrial pressure of 8 mmHg. IAS/Shunts: The atrial septum is grossly normal.  LEFT VENTRICLE PLAX 2D LVIDd:         5.80 cm      Diastology LVIDs:         3.40 cm      LV e' medial:    10.90 cm/s LV PW:         1.00 cm      LV E/e' medial:  12.3 LV IVS:        1.00 cm      LV e' lateral:    14.90 cm/s LVOT diam:     2.00 cm      LV E/e' lateral: 9.0 LV SV:         99 LV SV Index:   57 LVOT Area:     3.14 cm  LV Volumes (MOD) LV vol d, MOD A2C: 215.0 ml LV vol d, MOD A4C: 224.0 ml LV vol s, MOD A2C:  76.1 ml LV vol s, MOD A4C: 78.5 ml LV SV MOD A2C:     138.9 ml LV SV MOD A4C:     224.0 ml LV SV MOD BP:      142.1 ml RIGHT VENTRICLE             IVC RV Basal diam:  4.60 cm     IVC diam: 1.80 cm RV S prime:     14.90 cm/s TAPSE (M-mode): 2.0 cm RVSP:           32.4 mmHg LEFT ATRIUM              Index        RIGHT ATRIUM           Index LA diam:        5.40 cm  3.09 cm/m   RA Pressure: 3.00 mmHg LA Vol (A2C):   164.0 ml 93.71 ml/m  RA Area:     31.20 cm LA Vol (A4C):   170.0 ml 97.14 ml/m  RA Volume:   124.00 ml 70.86 ml/m LA Biplane Vol: 173.0 ml 98.85 ml/m  AORTIC VALVE                     PULMONIC VALVE AV Area (Vmax):    2.97 cm      PV Vmax:       1.55 m/s AV Area (Vmean):   2.79 cm      PV Peak grad:  9.6 mmHg AV Area (VTI):     3.03 cm AV Vmax:           181.00 cm/s AV Vmean:          103.000 cm/s AV VTI:            0.327 m AV Peak Grad:      13.1 mmHg AV Mean Grad:      5.0 mmHg LVOT Vmax:         171.00 cm/s LVOT Vmean:        91.400 cm/s LVOT VTI:          0.315 m LVOT/AV VTI ratio: 0.96 AI PHT:            135 msec  AORTA Ao Root diam: 3.90 cm Ao Asc diam:  4.50 cm MITRAL VALVE                TRICUSPID VALVE MV Area (PHT): 4.77 cm     TR Peak grad:   29.4 mmHg MV Area VTI:   3.64 cm     TR Mean grad:   29.0 mmHg MV Peak grad:  8.5 mmHg     TR Vmax:        271.00 cm/s MV Mean grad:  3.0 mmHg     Estimated RAP:  3.00 mmHg MV Vmax:       1.46 m/s     RVSP:           32.4 mmHg MV Vmean:      75.3 cm/s MV Decel Time: 159 msec     SHUNTS MR Peak grad: 106.5 mmHg    Systemic VTI:  0.32 m MR Mean grad: 106.0 mmHg    Systemic Diam: 2.00 cm MR Vmax:      516.00 cm/s MV E velocity: 134.00 cm/s MV A velocity: 109.00 cm/s MV E/A ratio:  1.23 Sunit Tolia Electronically signed by Madonna Large  Signature Date/Time: 03/20/2024/1:44:06 PM    Final  Dorn JONELLE Glade 03/22/2024, 8:22 AM

## 2024-03-22 NOTE — Progress Notes (Signed)
 [facility] Progress Note  Name: Gloria Gallagher MRN: [id number] DOB: 08-29-1950  Subjective:  Saw in room, up in chair No SOB or CP Supposed to have neck surgery tomorrow afternoon per patient   Vitals:  Vitals:   03/21/24 1711 03/21/24 2052 03/22/24 0300 03/22/24 0924  BP: (!) 140/67 (!) 148/64 (!) 151/63 (!) 147/69  Pulse: 68 99  95  Resp: 15 16  16   Temp: 98.3 F (36.8 C) 97.8 F (36.6 C) 97.7 F (36.5 C)   TempSrc: Oral Oral Oral   SpO2: 100% 100%  100%  Weight:      Height:         Physical Exam:    General adult female in bed in no acute distress  Neck in c-collar Lungs CTA bilat, 2L [place] O2 Heart S1S2 no rub Abdomen soft nontender nondistended Ext: no edema Neuro: alert and Ox3  Access: LUE AVF+b/t    OP HD: [place] MWF 3h  B400   66.4kg    2K bath  AVF  Heparin  none Very good compliance Gets to dry wt, ave wg= 1- 3kg    Assessment/Plan:   # 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  - neurosurgery has seen  - plans for for C1-C2 posterior cervical fusion on Monday 11/10   # Fall with Left pubic rami fracture - Per orthopedic surgery - PT has seen   # ESRD - HD MWF -  will schedule HD around neck surgery on 11/10  # Hypertension/ volume - admit 11/03 cxr was clear, also CT chest same date w/ no edema  - 1-2 kg under dry wt - low UF goal next HD 1-2 L     #Anemia of CKD - No ESA indicated   # Metabolic bone disease - continue Hectorol  and Sensipar  - auryxia  for hyperphosphatemia   Myer Fret  MD  [facility] 03/22/2024, 2:48 PM  Recent Labs  Lab 03/16/24 1919 03/16/24 1955 03/17/24 0227 03/18/24 0438 03/19/24 0404 03/20/24 0448 03/21/24 0541 03/22/24 0503  HGB 12.9   < > 11.3*   < > 11.0* 11.2*  --   --   ALBUMIN  3.6  --  3.4*  --   --   --   --   --   CALCIUM  10.2  --  9.4   < > 8.0* 8.1* 8.7* 8.3*  PHOS  --   --  4.6  --   --   --   --   --   CREATININE 5.77*   < > 6.13*   < > 5.85* 7.78* 5.12* 6.67*   K 4.2   < > 4.0   < > 3.5 4.1 3.6 4.3   < > = values in this interval not displayed.    Inpatient medications:  (feeding supplement) PROSource Plus  30 mL Oral BID BM   acetaminophen   1,000 mg Oral Q8H   amiodarone   200 mg Oral Daily   atorvastatin   20 mg Oral QPM   Chlorhexidine  Gluconate Cloth  6 each Topical Q0600   cinacalcet   90 mg Oral QPM   doxercalciferol   4 mcg Intravenous Q M,W,F-HD   ferric citrate   420 mg Oral TID WC   methocarbamol  1,000 mg Oral Q8H   multivitamin  1 tablet Oral QHS    hydrALAZINE , oxyCODONE , prochlorperazine

## 2024-03-22 NOTE — Progress Notes (Signed)
 Patient refuses to sign the consent form until the surgeon explains the procedure again and she has a full understanding.

## 2024-03-22 NOTE — Progress Notes (Signed)
 PROGRESS NOTE    Gloria Gallagher  FMW:[id number] DOB: Sep 14, 1950 DOA: 03/16/2024 PCP: Campbell Reynolds, NP    Brief Narrative:  Gloria Gallagher is a 73 y.o. female with medical history significant for ESRD on HD MWF, PVD, PAF on Eliquis , chronic HFpEF, who presents to the ER after a mechanical fall at home. The patient was attempting to hit a fly, lost her balance and fell, LOC. Level 2 trauma was activated due to the patient being on blood thinners. Report pelvic pain. Left-sided pubic rami fracture and C1-C2 subluxation revealed on CT scan.  Seen by trauma team and orthopedic surgery.  Recommended nonoperative management.  Weightbearing as tolerated.  Return to clinic in 4 weeks for x-ray following discharge.  Pain control.  Discussed the case with neurosurgery, recommended to keep the patient on cervical collar and plan for surgery on 03/23/24     Assessment and Plan: Fall, mechanical, POA Continue pain control and fall precautions Continue to keep the patient on cervical collar as recommended by neurosurgery PT OT evaluation   C1-C2 subluxation, POA Remote ununited displaced type II dens fracture with mild posterior subluxation of C2 relative to the lateral masses of C1, indeterminate acuity given Motion limited study without definitive edema or fluid collection to suggest acute ligamentous injury Neurosurgery consulted, noted unstable fracture, with risk for catastrophic neurologic injury, recommend wearing hard cervical collar at all times for now and plan for surgery on 03/23/24 TTE ordered for preoperative evaluation-showed EF of 60 to 65%, no regional wall motion abnormality, right ventricular systolic function is normal, mildly elevated pulmonary artery systolic pressure Pt ok to mobilize with the hard cervical collar.   Left-sided pubic rami fracture, POA Weightbearing as tolerated PT OT evaluation under the guidance of orthopedic surgery Follow-up outpatient 4 weeks after  discharge for repeat imaging Pain management with Tylenol  scheduled, reports opioids making her nauseous   Paroxysmal A-fib on Eliquis  Continue amiodarone  for rate control Hold Eliquis  in anticipation for surgery Continue to monitor on telemetry   PVD Resume antiplatelets when okay with orthopedic surgery and neurosurgery. Resume home statin   ESRD on HD MWF Hyponatremia Nephrology on board    DVT prophylaxis: SCDs Start: 03/16/24 2326    Code Status: Full Code Family Communication: None at bedside  Disposition Plan:  Level of care: Telemetry Status is: Inpatient     Consultants:  Trauma Ortho Neurosurgery   Subjective: Denies any new complaints.  Reports c-collar is uncomfortable, educated on the need to wear it at all times.   Objective: Vitals:   03/21/24 1711 03/21/24 2052 03/22/24 0300 03/22/24 0924  BP: (!) 140/67 (!) 148/64 (!) 151/63 (!) 147/69  Pulse: 68 99  95  Resp: 15 16  16   Temp: 98.3 F (36.8 C) 97.8 F (36.6 C) 97.7 F (36.5 C)   TempSrc: Oral Oral Oral   SpO2: 100% 100%  100%  Weight:      Height:        Intake/Output Summary (Last 24 hours) at 03/22/2024 1453 Last data filed at 03/22/2024 1100 Gross per 24 hour  Intake 240 ml  Output --  Net 240 ml    Filed Weights   03/18/24 0601 03/20/24 1855  Weight: 68 kg 64.7 kg    Examination: General: NAD, noted cervical collar Cardiovascular: S1, S2 present Respiratory: CTAB Abdomen: Soft, nontender, nondistended, bowel sounds present Musculoskeletal: No bilateral pedal edema noted Skin: Normal Psychiatry: Fair mood       Data Reviewed: I  have personally reviewed following labs and imaging studies  CBC: Recent Labs  Lab 03/16/24 1919 03/16/24 1955 03/17/24 0227 03/18/24 0438 03/19/24 0404 03/20/24 0448  WBC 4.3  --  4.7 3.5* 4.4 4.1  NEUTROABS  --   --   --   --  2.9 2.3  HGB 12.9 14.3 11.3* 11.4* 11.0* 11.2*  HCT 41.4 42.0 35.5* 35.8* 35.4* 34.6*  MCV 96.3  --   95.9 95.2 95.4 94.3  PLT 149*  --  122* 126* 134* 134*   Basic Metabolic Panel: Recent Labs  Lab 03/17/24 0227 03/18/24 0438 03/19/24 0404 03/20/24 0448 03/21/24 0541 03/22/24 0503  NA 136 137 134* 130* 133* 129*  K 4.0 4.2 3.5 4.1 3.6 4.3  CL 95* 91* 92* 91* 90* 89*  CO2 28 28 25 26 27 23   GLUCOSE 103* 99 94 95 83 88  BUN 28* 38* 24* 34* 21 28*  CREATININE 6.13* 8.24* 5.85* 7.78* 5.12* 6.67*  CALCIUM  9.4 8.5* 8.0* 8.1* 8.7* 8.3*  PHOS 4.6  --   --   --   --   --    GFR: Estimated Creatinine Clearance: 6.7 mL/min (A) (by C-G formula based on SCr of 6.67 mg/dL (H)). Liver Function Tests: Recent Labs  Lab 03/16/24 1919 03/17/24 0227  AST 22  --   ALT 14  --   ALKPHOS 170*  --   BILITOT 0.8  --   PROT 7.5  --   ALBUMIN  3.6 3.4*   No results for input(s): LIPASE, AMYLASE in the last 168 hours. No results for input(s): AMMONIA in the last 168 hours. Coagulation Profile: Recent Labs  Lab 03/16/24 1919  INR 1.4*   Cardiac Enzymes: No results for input(s): CKTOTAL, CKMB, CKMBINDEX, TROPONINI in the last 168 hours. BNP (last 3 results) No results for input(s): PROBNP in the last 8760 hours. HbA1C: No results for input(s): HGBA1C in the last 72 hours. CBG: No results for input(s): GLUCAP in the last 168 hours. Lipid Profile: No results for input(s): CHOL, HDL, LDLCALC, TRIG, CHOLHDL, LDLDIRECT in the last 72 hours. Thyroid  Function Tests: No results for input(s): TSH, T4TOTAL, FREET4, T3FREE, THYROIDAB in the last 72 hours. Anemia Panel: No results for input(s): VITAMINB12, FOLATE, FERRITIN, TIBC, IRON , RETICCTPCT in the last 72 hours. Sepsis Labs: Recent Labs  Lab 03/16/24 1955  LATICACIDVEN 2.8*    No results found for this or any previous visit (from the past 240 hours).       Radiology Studies: No results found.        Scheduled Meds:  (feeding supplement) PROSource Plus  30 mL Oral BID BM    acetaminophen   1,000 mg Oral Q8H   amiodarone   200 mg Oral Daily   atorvastatin   20 mg Oral QPM   Chlorhexidine  Gluconate Cloth  6 each Topical Q0600   cinacalcet   90 mg Oral QPM   doxercalciferol   4 mcg Intravenous Q M,W,F-HD   ferric citrate   420 mg Oral TID WC   methocarbamol  1,000 mg Oral Q8H   multivitamin  1 tablet Oral QHS   Continuous Infusions:   LOS: 6 days        Lebron JINNY Cage, MD [facility] Available via Epic secure chat 7am-7pm After these hours, please refer to coverage provider listed on [email or web address] 03/22/2024, 2:53 PM

## 2024-03-22 NOTE — Progress Notes (Signed)
 Erroneous picture uploaded. IT reports photo should be removed within 24 hours.

## 2024-03-22 NOTE — Plan of Care (Signed)
  Problem: Clinical Measurements: Goal: Ability to maintain clinical measurements within normal limits will improve Outcome: Progressing   Problem: Clinical Measurements: Goal: Diagnostic test results will improve Outcome: Progressing   Problem: Activity: Goal: Risk for activity intolerance will decrease Outcome: Progressing   Problem: Nutrition: Goal: Adequate nutrition will be maintained Outcome: Progressing

## 2024-03-22 NOTE — Plan of Care (Signed)

## 2024-03-22 NOTE — Plan of Care (Signed)
  Problem: Education: Goal: Knowledge of General Education information will improve Description: Including pain rating scale, medication(s)/side effects and non-pharmacologic comfort measures Outcome: Progressing   Problem: Activity: Goal: Risk for activity intolerance will decrease Outcome: Progressing   Problem: Pain Managment: Goal: General experience of comfort will improve and/or be controlled Outcome: Progressing

## 2024-03-23 ENCOUNTER — Inpatient Hospital Stay (HOSPITAL_COMMUNITY)

## 2024-03-23 ENCOUNTER — Encounter (HOSPITAL_COMMUNITY): Payer: Self-pay | Admitting: Internal Medicine

## 2024-03-23 ENCOUNTER — Other Ambulatory Visit: Payer: Self-pay

## 2024-03-23 ENCOUNTER — Inpatient Hospital Stay (HOSPITAL_COMMUNITY): Admitting: Anesthesiology

## 2024-03-23 ENCOUNTER — Encounter (HOSPITAL_COMMUNITY)
Admission: EM | Disposition: A | Discharge status: Home / Self Care | Payer: Self-pay | Source: Home / Self Care | Attending: Internal Medicine

## 2024-03-23 DIAGNOSIS — N186 End stage renal disease: Secondary | ICD-10-CM | POA: Diagnosis not present

## 2024-03-23 DIAGNOSIS — S12100A Unspecified displaced fracture of second cervical vertebra, initial encounter for closed fracture: Secondary | ICD-10-CM | POA: Diagnosis not present

## 2024-03-23 DIAGNOSIS — W19XXXA Unspecified fall, initial encounter: Secondary | ICD-10-CM | POA: Diagnosis not present

## 2024-03-23 LAB — CBC WITH DIFFERENTIAL/PLATELET
Abs Immature Granulocytes: 0.02 K/uL (ref 0.00–0.07)
Basophils Absolute: 0.1 K/uL (ref 0.0–0.1)
Basophils Relative: 2 %
Eosinophils Absolute: 0.1 K/uL (ref 0.0–0.5)
Eosinophils Relative: 2 %
HCT: 33.9 % — ABNORMAL LOW (ref 36.0–46.0)
Hemoglobin: 11.1 g/dL — ABNORMAL LOW (ref 12.0–15.0)
Immature Granulocytes: 1 %
Lymphocytes Relative: 21 %
Lymphs Abs: 0.7 K/uL (ref 0.7–4.0)
MCH: 30.3 pg (ref 26.0–34.0)
MCHC: 32.7 g/dL (ref 30.0–36.0)
MCV: 92.6 fL (ref 80.0–100.0)
Monocytes Absolute: 0.5 K/uL (ref 0.1–1.0)
Monocytes Relative: 14 %
Neutro Abs: 2.1 K/uL (ref 1.7–7.7)
Neutrophils Relative %: 60 %
Platelets: 149 K/uL — ABNORMAL LOW (ref 150–400)
RBC: 3.66 MIL/uL — ABNORMAL LOW (ref 3.87–5.11)
RDW: 15.4 % (ref 11.5–15.5)
WBC: 3.4 K/uL — ABNORMAL LOW (ref 4.0–10.5)
nRBC: 0 % (ref 0.0–0.2)

## 2024-03-23 LAB — POCT I-STAT, CHEM 8
BUN: 25 mg/dL — ABNORMAL HIGH (ref 8–23)
Calcium, Ion: 1.05 mmol/L — ABNORMAL LOW (ref 1.15–1.40)
Chloride: 90 mmol/L — ABNORMAL LOW (ref 98–111)
Creatinine, Ser: 5.3 mg/dL — ABNORMAL HIGH (ref 0.44–1.00)
Glucose, Bld: 91 mg/dL (ref 70–99)
HCT: 40 % (ref 36.0–46.0)
Hemoglobin: 13.6 g/dL (ref 12.0–15.0)
Potassium: 4.3 mmol/L (ref 3.5–5.1)
Sodium: 131 mmol/L — ABNORMAL LOW (ref 135–145)
TCO2: 30 mmol/L (ref 22–32)

## 2024-03-23 LAB — BASIC METABOLIC PANEL WITH GFR
Anion gap: 17 — ABNORMAL HIGH (ref 5–15)
BUN: 34 mg/dL — ABNORMAL HIGH (ref 8–23)
CO2: 24 mmol/L (ref 22–32)
Calcium: 8.2 mg/dL — ABNORMAL LOW (ref 8.9–10.3)
Chloride: 89 mmol/L — ABNORMAL LOW (ref 98–111)
Creatinine, Ser: 8.05 mg/dL — ABNORMAL HIGH (ref 0.44–1.00)
GFR, Estimated: 5 mL/min — ABNORMAL LOW (ref >60)
Glucose, Bld: 95 mg/dL (ref 70–99)
Potassium: 4 mmol/L (ref 3.5–5.1)
Sodium: 130 mmol/L — ABNORMAL LOW (ref 135–145)

## 2024-03-23 LAB — TYPE AND SCREEN
ABO/RH(D): B POS
Antibody Screen: NEGATIVE

## 2024-03-23 LAB — SURGICAL PCR SCREEN
MRSA, PCR: NEGATIVE
Staphylococcus aureus: NEGATIVE

## 2024-03-23 SURGERY — POSTERIOR CERVICAL FUSION/FORAMINOTOMY LEVEL 1
Anesthesia: General

## 2024-03-23 MED ORDER — PHENOL 1.4 % MT LIQD: 1.0000 | OROMUCOSAL | Status: DC | PRN

## 2024-03-23 MED ORDER — ONDANSETRON HCL 4 MG/2ML IJ SOLN
INTRAMUSCULAR | Status: DC | PRN
  Administered 2024-03-23: 4 mg via INTRAVENOUS

## 2024-03-23 MED ORDER — SODIUM CHLORIDE 0.9 % IV SOLN
250.0000 mL | INTRAVENOUS | Status: AC
  Administered 2024-03-23: 250 mL via INTRAVENOUS

## 2024-03-23 MED ORDER — BUPIVACAINE HCL (PF) 0.25 % IJ SOLN
INTRAMUSCULAR | Status: AC
  Filled 2024-03-23: qty 30

## 2024-03-23 MED ORDER — FENTANYL CITRATE (PF) 100 MCG/2ML IJ SOLN
INTRAMUSCULAR | Status: AC
  Filled 2024-03-23: qty 2

## 2024-03-23 MED ORDER — HYDROMORPHONE HCL 1 MG/ML IJ SOLN: 1.0000 mg | INTRAMUSCULAR | Status: DC | PRN

## 2024-03-23 MED ORDER — 0.9 % SODIUM CHLORIDE (POUR BTL) OPTIME
TOPICAL | Status: DC | PRN
  Administered 2024-03-23 (×2): 1000 mL

## 2024-03-23 MED ORDER — LIDOCAINE 2% (20 MG/ML) 5 ML SYRINGE
INTRAMUSCULAR | Status: DC | PRN
  Administered 2024-03-23: 100 mg via INTRAVENOUS

## 2024-03-23 MED ORDER — PHENYLEPHRINE 80 MCG/ML (10ML) SYRINGE FOR IV PUSH (FOR BLOOD PRESSURE SUPPORT)
PREFILLED_SYRINGE | INTRAVENOUS | Status: DC | PRN
  Administered 2024-03-23 (×3): 160 ug via INTRAVENOUS
  Administered 2024-03-23: 80 ug via INTRAVENOUS

## 2024-03-23 MED ORDER — ALTEPLASE 2 MG IJ SOLR
2.0000 mg | Freq: Once | INTRAMUSCULAR | Status: DC | PRN
Start: 2024-03-23 — End: 2024-03-23

## 2024-03-23 MED ORDER — ANTICOAGULANT SODIUM CITRATE 4% (200MG/5ML) IV SOLN: 5.0000 mL | Status: DC | PRN

## 2024-03-23 MED ORDER — THROMBIN 5000 UNITS EX SOLR
OROMUCOSAL | Status: DC | PRN
  Administered 2024-03-23 (×2): 5 mL via TOPICAL

## 2024-03-23 MED ORDER — ANTICOAGULANT SODIUM CITRATE 4% (200MG/5ML) IV SOLN
5.0000 mL | Status: DC | PRN
  Filled 2024-03-23: qty 5

## 2024-03-23 MED ORDER — VANCOMYCIN HCL 1000 MG IV SOLR
INTRAVENOUS | Status: AC
  Filled 2024-03-23: qty 20

## 2024-03-23 MED ORDER — PENTAFLUOROPROP-TETRAFLUOROETH EX AERO: 1.0000 | INHALATION_SPRAY | CUTANEOUS | Status: DC | PRN

## 2024-03-23 MED ORDER — LIDOCAINE HCL (PF) 1 % IJ SOLN: 5.0000 mL | INTRAMUSCULAR | Status: DC | PRN

## 2024-03-23 MED ORDER — VANCOMYCIN HCL 1000 MG IV SOLR
INTRAVENOUS | Status: DC | PRN
  Administered 2024-03-23: 1000 mg

## 2024-03-23 MED ORDER — MENTHOL 3 MG MT LOZG: 1.0000 | LOZENGE | OROMUCOSAL | Status: DC | PRN

## 2024-03-23 MED ORDER — ROCURONIUM BROMIDE 10 MG/ML (PF) SYRINGE
PREFILLED_SYRINGE | INTRAVENOUS | Status: DC | PRN
  Administered 2024-03-23: 30 mg via INTRAVENOUS
  Administered 2024-03-23: 20 mg via INTRAVENOUS
  Administered 2024-03-23: 50 mg via INTRAVENOUS

## 2024-03-23 MED ORDER — VECURONIUM BROMIDE 10 MG IV SOLR
INTRAVENOUS | Status: DC | PRN
  Administered 2024-03-23: 1 mg via INTRAVENOUS
  Administered 2024-03-23: 3 mg via INTRAVENOUS
  Administered 2024-03-23: 2 mg via INTRAVENOUS
  Administered 2024-03-23: 1 mg via INTRAVENOUS

## 2024-03-23 MED ORDER — GLYCOPYRROLATE PF 0.2 MG/ML IJ SOSY
PREFILLED_SYRINGE | INTRAMUSCULAR | Status: DC | PRN
  Administered 2024-03-23: .2 mg via INTRAVENOUS

## 2024-03-23 MED ORDER — CEFAZOLIN SODIUM-DEXTROSE 1-4 GM/50ML-% IV SOLN
1.0000 g | Freq: Three times a day (TID) | INTRAVENOUS | Status: AC
  Administered 2024-03-23 – 2024-03-24 (×2): 1 g via INTRAVENOUS
  Filled 2024-03-23 (×2): qty 50

## 2024-03-23 MED ORDER — THROMBIN 5000 UNITS EX KIT
PACK | CUTANEOUS | Status: AC
  Filled 2024-03-23: qty 3

## 2024-03-23 MED ORDER — ROCURONIUM BROMIDE 10 MG/ML (PF) SYRINGE
PREFILLED_SYRINGE | INTRAVENOUS | Status: AC
Start: 2024-03-23 — End: 2024-03-23
  Filled 2024-03-23: qty 10

## 2024-03-23 MED ORDER — BACITRACIN ZINC 500 UNIT/GM EX OINT
TOPICAL_OINTMENT | CUTANEOUS | Status: AC
  Filled 2024-03-23: qty 28.35

## 2024-03-23 MED ORDER — FENTANYL CITRATE (PF) 250 MCG/5ML IJ SOLN
INTRAMUSCULAR | Status: DC | PRN
  Administered 2024-03-23 (×2): 50 ug via INTRAVENOUS
  Administered 2024-03-23: 25 ug via INTRAVENOUS
  Administered 2024-03-23: 50 ug via INTRAVENOUS

## 2024-03-23 MED ORDER — SODIUM CHLORIDE 0.9 % IV SOLN: INTRAVENOUS | Status: DC

## 2024-03-23 MED ORDER — THROMBIN (RECOMBINANT) 5000 UNITS EX SOLR
CUTANEOUS | Status: DC | PRN
  Administered 2024-03-23: 10 mL via TOPICAL

## 2024-03-23 MED ORDER — SUCCINYLCHOLINE CHLORIDE 200 MG/10ML IV SOSY
PREFILLED_SYRINGE | INTRAVENOUS | Status: DC | PRN
  Administered 2024-03-23: 120 mg via INTRAVENOUS

## 2024-03-23 MED ORDER — SUGAMMADEX SODIUM 200 MG/2ML IV SOLN
INTRAVENOUS | Status: DC | PRN
  Administered 2024-03-23: 200 mg via INTRAVENOUS
  Administered 2024-03-23: 100 mg via INTRAVENOUS

## 2024-03-23 MED ORDER — PHENYLEPHRINE HCL-NACL 20-0.9 MG/250ML-% IV SOLN
INTRAVENOUS | Status: DC | PRN
  Administered 2024-03-23: 160 ug via INTRAVENOUS
  Administered 2024-03-23: 40 ug/min via INTRAVENOUS

## 2024-03-23 MED ORDER — BACITRACIN ZINC 500 UNIT/GM EX OINT
TOPICAL_OINTMENT | CUTANEOUS | Status: DC | PRN
  Administered 2024-03-23: 1 via TOPICAL

## 2024-03-23 MED ORDER — PROPOFOL 10 MG/ML IV BOLUS
INTRAVENOUS | Status: AC
Start: 2024-03-23 — End: 2024-03-23
  Filled 2024-03-23: qty 20

## 2024-03-23 MED ORDER — ONDANSETRON HCL 4 MG/2ML IJ SOLN
INTRAMUSCULAR | Status: AC
  Filled 2024-03-23: qty 2

## 2024-03-23 MED ORDER — VANCOMYCIN HCL IN DEXTROSE 1-5 GM/200ML-% IV SOLN
INTRAVENOUS | Status: AC
  Administered 2024-03-23: 1000 mg via INTRAVENOUS
  Filled 2024-03-23: qty 200

## 2024-03-23 MED ORDER — DEXAMETHASONE SOD PHOSPHATE PF 10 MG/ML IJ SOLN
INTRAMUSCULAR | Status: DC | PRN
  Administered 2024-03-23: 10 mg via INTRAVENOUS

## 2024-03-23 MED ORDER — CHLORHEXIDINE GLUCONATE 0.12 % MT SOLN
OROMUCOSAL | Status: AC
  Administered 2024-03-23: 15 mL via OROMUCOSAL
  Filled 2024-03-23: qty 15

## 2024-03-23 MED ORDER — PROPOFOL 10 MG/ML IV BOLUS
INTRAVENOUS | Status: AC
  Filled 2024-03-23: qty 20

## 2024-03-23 MED ORDER — HEPARIN SODIUM (PORCINE) 1000 UNIT/ML DIALYSIS: 1000.0000 [IU] | INTRAMUSCULAR | Status: DC | PRN

## 2024-03-23 MED ORDER — SODIUM CHLORIDE 0.9% FLUSH
3.0000 mL | Freq: Two times a day (BID) | INTRAVENOUS | Status: DC
  Administered 2024-03-23 – 2024-03-28 (×9): 3 mL via INTRAVENOUS

## 2024-03-23 MED ORDER — LIDOCAINE-PRILOCAINE 2.5-2.5 % EX CREA: 1.0000 | TOPICAL_CREAM | CUTANEOUS | Status: DC | PRN

## 2024-03-23 MED ORDER — PENTAFLUOROPROP-TETRAFLUOROETH EX AERO
1.0000 | INHALATION_SPRAY | CUTANEOUS | Status: DC | PRN
Start: 2024-03-23 — End: 2024-03-23

## 2024-03-23 MED ORDER — CHLORHEXIDINE GLUCONATE 0.12 % MT SOLN: 15.0000 mL | Freq: Once | OROMUCOSAL | Status: AC

## 2024-03-23 MED ORDER — ALTEPLASE 2 MG IJ SOLR: 2.0000 mg | Freq: Once | INTRAMUSCULAR | Status: DC | PRN

## 2024-03-23 MED ORDER — PROPOFOL 10 MG/ML IV BOLUS
INTRAVENOUS | Status: DC | PRN
  Administered 2024-03-23: 60 mg via INTRAVENOUS
  Administered 2024-03-23: 100 mg via INTRAVENOUS

## 2024-03-23 MED ORDER — NEPRO/CARBSTEADY PO LIQD
237.0000 mL | ORAL | Status: DC | PRN
  Filled 2024-03-23: qty 237

## 2024-03-23 MED ORDER — THROMBIN 5000 UNITS EX KIT
PACK | CUTANEOUS | Status: AC
  Filled 2024-03-23: qty 1

## 2024-03-23 MED ORDER — SODIUM CHLORIDE 0.9% FLUSH: 3.0000 mL | INTRAVENOUS | Status: DC | PRN

## 2024-03-23 MED ORDER — DOXERCALCIFEROL 4 MCG/2ML IV SOLN
INTRAVENOUS | Status: AC
  Filled 2024-03-23: qty 2

## 2024-03-23 MED ORDER — LIDOCAINE 2% (20 MG/ML) 5 ML SYRINGE
INTRAMUSCULAR | Status: AC
  Filled 2024-03-23: qty 5

## 2024-03-23 MED ORDER — ORAL CARE MOUTH RINSE: 15.0000 mL | Freq: Once | OROMUCOSAL | Status: AC

## 2024-03-23 SURGICAL SUPPLY — 54 items
BAG COUNTER SPONGE SURGICOUNT (BAG) ×1 IMPLANT
BENZOIN TINCTURE PRP APPL 2/3 (GAUZE/BANDAGES/DRESSINGS) ×2 IMPLANT
BIT DRILL 2.4 (BIT) IMPLANT
BLADE CLIPPER SURG (BLADE) IMPLANT
BUR MATCHSTICK NEURO 3.0 LAGG (BURR) ×1 IMPLANT
CABLE SNG STERILE W/CRIMP (Cable) IMPLANT
CANISTER SUCTION 3000ML PPV (SUCTIONS) ×1 IMPLANT
DERMABOND ADVANCED .7 DNX12 (GAUZE/BANDAGES/DRESSINGS) ×1 IMPLANT
DERMABOND ADVANCED .7 DNX6 (GAUZE/BANDAGES/DRESSINGS) IMPLANT
DRAPE C-ARM 42X72 X-RAY (DRAPES) ×2 IMPLANT
DRAPE LAPAROTOMY 100X72 PEDS (DRAPES) ×1 IMPLANT
DRSG OPSITE POSTOP 4X6 (GAUZE/BANDAGES/DRESSINGS) ×1 IMPLANT
DURAPREP 26ML APPLICATOR (WOUND CARE) ×1 IMPLANT
ELECTRODE REM PT RTRN 9FT ADLT (ELECTROSURGICAL) ×1 IMPLANT
EVACUATOR 1/8 PVC DRAIN (DRAIN) IMPLANT
GAUZE 4X4 16PLY ~~LOC~~+RFID DBL (SPONGE) IMPLANT
GAUZE SPONGE 4X4 12PLY STRL (GAUZE/BANDAGES/DRESSINGS) ×1 IMPLANT
GLOVE BIO SURGEON STRL SZ 6.5 (GLOVE) ×1 IMPLANT
GLOVE BIOGEL PI IND STRL 6.5 (GLOVE) ×1 IMPLANT
GLOVE ECLIPSE 9.0 STRL (GLOVE) ×1 IMPLANT
GOWN STRL REUS W/ TWL LRG LVL3 (GOWN DISPOSABLE) IMPLANT
GOWN STRL REUS W/ TWL XL LVL3 (GOWN DISPOSABLE) ×1 IMPLANT
GOWN STRL REUS W/TWL 2XL LVL3 (GOWN DISPOSABLE) IMPLANT
HEMOSTAT POWDER KIT SURGIFOAM (HEMOSTASIS) IMPLANT
KIT BASIN OR (CUSTOM PROCEDURE TRAY) ×1 IMPLANT
KIT TURNOVER KIT B (KITS) ×1 IMPLANT
NDL HYPO 22X1.5 SAFETY MO (MISCELLANEOUS) ×1 IMPLANT
NDL SPNL 22GX3.5 QUINCKE BK (NEEDLE) ×1 IMPLANT
NEEDLE HYPO 22X1.5 SAFETY MO (MISCELLANEOUS) ×1 IMPLANT
NEEDLE SPNL 22GX3.5 QUINCKE BK (NEEDLE) ×1 IMPLANT
PACK LAMINECTOMY NEURO (CUSTOM PROCEDURE TRAY) ×1 IMPLANT
PAD ARMBOARD POSITIONER FOAM (MISCELLANEOUS) ×3 IMPLANT
PENCIL BUTTON HOLSTER BLD 10FT (ELECTRODE) ×1 IMPLANT
PIN MAYFIELD SKULL DISP (PIN) ×1 IMPLANT
RELOAD STAPLE 45 BLU REG DVNC (STAPLE) IMPLANT
ROD PRE-CUT 3.5X30 (Rod) IMPLANT
SCREW MA INFINITY 4X16 (Screw) IMPLANT
SCREW MULT AX PT 28X3.5XMA NS (Screw) IMPLANT
SCREW MULTI AXIAL 3.5X16MM (Screw) IMPLANT
SCREW MULTI AXIAL PT 3.5X30 (Screw) IMPLANT
SET SCREW INFINITY IFIX THOR (Screw) IMPLANT
SOLN 0.9% NACL POUR BTL 1000ML (IV SOLUTION) ×1 IMPLANT
SOLN STERILE WATER BTL 1000 ML (IV SOLUTION) ×1 IMPLANT
SPONGE SURGIFOAM ABS GEL SZ50 (HEMOSTASIS) ×1 IMPLANT
SPONGE T-LAP 4X18 ~~LOC~~+RFID (SPONGE) IMPLANT
STAPLER SKIN PROX 35W (STAPLE) IMPLANT
STRIP CLOSURE SKIN 1/2X4 (GAUZE/BANDAGES/DRESSINGS) ×1 IMPLANT
STRIP ILIUM TRICORT 2.2CMX60MM (Neuro Prosthesis/Implant) IMPLANT
SUT VIC AB 0 CT1 18XCR BRD8 (SUTURE) ×1 IMPLANT
SUT VIC AB 2-0 CT1 18 (SUTURE) ×1 IMPLANT
SUT VIC AB 3-0 SH 8-18 (SUTURE) ×1 IMPLANT
TAPE CLOTH 4X10 WHT NS (GAUZE/BANDAGES/DRESSINGS) ×1 IMPLANT
TOWEL GREEN STERILE (TOWEL DISPOSABLE) ×1 IMPLANT
TOWEL GREEN STERILE FF (TOWEL DISPOSABLE) ×1 IMPLANT

## 2024-03-23 NOTE — Plan of Care (Signed)
  Problem: Pain Managment: Goal: General experience of comfort will improve and/or be controlled Outcome: Progressing   Problem: Safety: Goal: Ability to remain free from injury will improve Outcome: Progressing   Problem: Skin Integrity: Goal: Risk for impaired skin integrity will decrease Outcome: Progressing

## 2024-03-23 NOTE — Progress Notes (Signed)
 700 ml ultrafiltration, report was given to the primary RN, and post hemodialysis condition of this patient is stable.

## 2024-03-23 NOTE — Plan of Care (Signed)

## 2024-03-23 NOTE — Op Note (Signed)
 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  Asst. Surgeon: Jennetta, NP  Anesthesia: General  Indication: 73 year old female remotely status post type II odontoid fracture managed without surgery.  The patient suffered a recent fall with re fracturing at C2 now with anterior displacement and angulation with cord signal change.  Patient presents now for posterior cervical fusion.  Operative note: After induction of anesthesia, the patient was carefully positioned prone onto bolsters with her head fixed in a [place] pin headrest.  Fluoroscopy was used to perform close reduction of her deformity with good alignment of her odontoid.  Patient's posterior cervical region prepped and draped sterilely.  Incision made from her occiput down to C4.  Dissection performed bilaterally.  Retractor placed.  Fluoroscopy used.  Levels confirmed.  Ligament between C1 and C2 was dissected free and the underlying dura was exposed.  This was tracked out laterally and the C2 nerve roots were identified bilaterally.  The lateral masses of C1 and C2 were dissected free.  Entry sites overlying the lateral masses C1 were determined and pilot holes were drilled.  On the patient's left side the bone was hypoplastic but eventually I was able to get into the lateral mass and a 28 mm partially-threaded vertex screw from [employer] was placed with good positioning under fluoroscopy and good purchase.  C2 lateral mass/pars interarticularis screws were then placed bilaterally.  The trajectory was somewhat inferior lateral on the left at C2 but good purchase was obtained and no evidence of vascular injury.  Good purchase was  achieved on the right side and 16 mm vertex screws were placed bilaterally.  Lateral mass screw was placed into C1 similar on the right side.  Pilot hole was drilled.  Pilot hole was tapped.  The hole was probed and found to be solidly thin bone.  30 mm vertex screw was placed on the right side.  Screws were then attached with a short segment of titanium rod and locking caps were applied and the construct was tightened.  A suture was passed beneath the lamina of C1 and a Songer cable was passed using the suture.  The Songer cable was then looped around a tricortical piece of allograft which had been fashion between the C1 and C2 spaces.  This was then secured into place beneath the spinous process of C2 in a manner first described by Sontag.  Prior to placing the bone graft the lamina of C1 and C2 were decorticated.  Final images reveal good position of the hardware at the proper operative level with much improved alignment of the spine.  Wound was irrigated.  Vancomycin  powder was placed in deep wound space.  Wound was then closed in layers with Vicryl sutures.  Steri-Strips and sterile dressing were applied.  No apparent complications.  Patient tolerated the procedure well and she returns to the recovery room postop.

## 2024-03-23 NOTE — Progress Notes (Signed)
 [facility] Progress Note  Name: Gloria Gallagher MRN: [id number] DOB: 02/02/1951  Subjective:  Saw in HD unit For surgery later this afternoon  Vitals:  Vitals:   03/23/24 0914 03/23/24 0928 03/23/24 1000 03/23/24 1030  BP:  (!) 154/67 (!) 163/72 130/70  Pulse:  65 80 69  Resp:  14 13 17   Temp:      TempSrc:      SpO2:    100%  Weight: 64.7 kg     Height:         Physical Exam:    General adult female in bed in no acute distress  Neck in c-collar Lungs CTA bilat, 2L [place] O2 Heart S1S2 no rub Abdomen soft nontender nondistended Ext: no edema Neuro: alert and Ox3  Access: LUE AVF+b/t    OP HD: [facility] M[facility] 3h  B400   66.4kg    2K bath  AVF  Heparin  none Very good compliance Gets to dry wt, ave wg= 1- 3kg    Assessment/Plan:   # 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  - neurosurgery has seen  - plans for neck surgery today Monday 11/10   # Fall with Left pubic rami fracture - Per orthopedic surgery - PT has seen   # ESRD - HD MWF -  is on HD now  # Hypertension/ volume - admit 11/03 cxr was clear, also CT chest w/ no edema  - 1-2 kg under dry wt - low UF goal next HD 1-2 L     #Anemia of CKD - No ESA indicated   # Metabolic bone disease - continue Hectorol  and Sensipar  - auryxia  for hyperphosphatemia   Myer Fret  MD  [facility] 03/23/2024, 11:04 AM  Recent Labs  Lab 03/16/24 1919 03/16/24 1955 03/17/24 0227 03/18/24 0438 03/20/24 0448 03/21/24 0541 03/22/24 0503 03/23/24 0539  HGB 12.9   < > 11.3*   < > 11.2*  --   --  11.1*  ALBUMIN  3.6  --  3.4*  --   --   --   --   --   CALCIUM  10.2  --  9.4   < > 8.1*   < > 8.3* 8.2*  PHOS  --   --  4.6  --   --   --   --   --   CREATININE 5.77*   < > 6.13*   < > 7.78*   < > 6.67* 8.05*  K 4.2   < > 4.0   < > 4.1   < > 4.3 4.0   < > = values in this interval not displayed.    Inpatient medications:  (feeding supplement) PROSource Plus  30 mL Oral BID BM   acetaminophen    1,000 mg Oral Q8H   amiodarone   200 mg Oral Daily   atorvastatin   20 mg Oral QPM   Chlorhexidine  Gluconate Cloth  6 each Topical Q0600   cinacalcet   90 mg Oral QPM   doxercalciferol   4 mcg Intravenous Q M,W,F-HD   ferric citrate   420 mg Oral TID WC   methocarbamol  1,000 mg Oral Q8H   multivitamin  1 tablet Oral QHS    anticoagulant sodium citrate     vancomycin      alteplase , anticoagulant sodium citrate, feeding supplement (NEPRO CARB STEADY), heparin , hydrALAZINE , lidocaine  (PF), lidocaine -prilocaine , oxyCODONE , pentafluoroprop-tetrafluoroeth, prochlorperazine

## 2024-03-23 NOTE — Progress Notes (Signed)
 Orthopedic Tech Progress Note Patient Details:  Gloria Gallagher [birth year] [id number]  Ortho Devices Type of Ortho Device: Soft collar Ortho Device/Splint Location: Neck Ortho Device/Splint Interventions: Ordered, Application, Adjustment   Post Interventions Patient Tolerated: Well Instructions Provided: Care of device, Adjustment of device  Chandra Dorn PARAS 03/23/2024, 8:40 PM

## 2024-03-23 NOTE — Anesthesia Procedure Notes (Signed)
 Procedure Name: Intubation Date/Time: 03/23/2024 5:09 PM  Performed by: Delores Duwaine SAUNDERS, CRNAPre-anesthesia Checklist: Patient identified, Emergency Drugs available, Suction available and Patient being monitored Patient Re-evaluated:Patient Re-evaluated prior to induction Oxygen  Delivery Method: Circle System Utilized Preoxygenation: Pre-oxygenation with 100% oxygen  Induction Type: IV induction Ventilation: Mask ventilation without difficulty Grade View: Grade I Tube type: Oral Tube size: 7.0 mm Number of attempts: 1 Airway Equipment and Method: Oral airway and Fiberoptic brochoscope Placement Confirmation: ETT inserted through vocal cords under direct vision, positive ETCO2 and breath sounds checked- equal and bilateral Secured at: 22 cm Tube secured with: Tape Dental Injury: Teeth and Oropharynx as per pre-operative assessment  Comments: Fiberoptic intubation performed to minimize neck movement and avoid laryngoscopy. C-Collar remained in place during intubation.

## 2024-03-23 NOTE — Progress Notes (Signed)
 Orthopedic Tech Progress Note Patient Details:  Gloria Gallagher Apr 09, 1951 [id number] RN from PACU called for soft collar Patient ID: Gloria Gallagher, female   DOB: 1951-01-21, 73 y.o.   MRN: [id number]  [name] Cos 03/23/2024, 7:52 PM

## 2024-03-23 NOTE — Progress Notes (Signed)
 PROGRESS NOTE    Gloria Gallagher  FMW:[id number] DOB: 01/30/1951 DOA: 03/16/2024 PCP: Campbell Reynolds, NP    Brief Narrative:  Gloria Gallagher is a 73 y.o. female with medical history significant for ESRD on HD MWF, PVD, PAF on Eliquis , chronic HFpEF, who presents to the ER after a mechanical fall at home. The patient was attempting to hit a fly, lost her balance and fell, LOC. Level 2 trauma was activated due to the patient being on blood thinners. Report pelvic pain. Left-sided pubic rami fracture and C1-C2 subluxation revealed on CT scan.  Seen by trauma team and orthopedic surgery.  Recommended nonoperative management.  Weightbearing as tolerated.  Return to clinic in 4 weeks for x-ray following discharge.  Pain control.  Discussed the case with neurosurgery, recommended to keep the patient on cervical collar and plan for surgery on 03/23/24     Assessment and Plan: Fall, mechanical, POA Continue pain control and fall precautions Continue to keep the patient on cervical collar as recommended by neurosurgery PT OT evaluation   C1-C2 subluxation, POA Remote ununited displaced type II dens fracture with mild posterior subluxation of C2 relative to the lateral masses of C1, indeterminate acuity given Motion limited study without definitive edema or fluid collection to suggest acute ligamentous injury Neurosurgery consulted, noted unstable fracture, with risk for catastrophic neurologic injury, recommend wearing hard cervical collar at all times for now and plan for surgery on 03/23/24 TTE ordered for preoperative evaluation-showed EF of 60 to 65%, no regional wall motion abnormality, right ventricular systolic function is normal, mildly elevated pulmonary artery systolic pressure Pt ok to mobilize with the hard cervical collar.   Left-sided pubic rami fracture, POA Weightbearing as tolerated PT OT evaluation under the guidance of orthopedic surgery Follow-up outpatient 4 weeks after  discharge for repeat imaging Pain management with Tylenol  scheduled, reports opioids making her nauseous   Paroxysmal A-fib on Eliquis  Continue amiodarone  for rate control Hold Eliquis  in anticipation for surgery Continue to monitor on telemetry   PVD Resume antiplatelets when okay with neurosurgery. Resume home statin   ESRD on HD MWF Hyponatremia Nephrology on board    DVT prophylaxis: SCD's Start: 03/22/24 1616 SCDs Start: 03/16/24 2326    Code Status: Full Code Family Communication: None at bedside  Disposition Plan:  Level of care: Telemetry Status is: Inpatient     Consultants:  Trauma Ortho Neurosurgery   Subjective: Saw patient today during HD, ready for surgery.  Denies any new complaints.   Objective: Vitals:   03/23/24 1100 03/23/24 1146 03/23/24 1150 03/23/24 1411  BP: (!) 144/70 (!) 144/63 (!) 152/78 (!) 144/68  Pulse: 77 73 73 74  Resp: (!) 21 17 19 17   Temp:  (!) 97.4 F (36.3 C)  97.7 F (36.5 C)  TempSrc:      SpO2:      Weight:   63.9 kg   Height:        Intake/Output Summary (Last 24 hours) at 03/23/2024 1515 Last data filed at 03/23/2024 1150 Gross per 24 hour  Intake --  Output 700 ml  Net -700 ml    Filed Weights   03/23/24 0821 03/23/24 0914 03/23/24 1150  Weight: 64.7 kg 64.7 kg 63.9 kg    Examination: General: NAD, noted cervical collar Cardiovascular: S1, S2 present Respiratory: CTAB Abdomen: Soft, nontender, nondistended, bowel sounds present Musculoskeletal: No bilateral pedal edema noted Skin: Normal Psychiatry: Normal mood       Data Reviewed: I have  personally reviewed following labs and imaging studies  CBC: Recent Labs  Lab 03/17/24 0227 03/18/24 0438 03/19/24 0404 03/20/24 0448 03/23/24 0539  WBC 4.7 3.5* 4.4 4.1 3.4*  NEUTROABS  --   --  2.9 2.3 2.1  HGB 11.3* 11.4* 11.0* 11.2* 11.1*  HCT 35.5* 35.8* 35.4* 34.6* 33.9*  MCV 95.9 95.2 95.4 94.3 92.6  PLT 122* 126* 134* 134* 149*   Basic  Metabolic Panel: Recent Labs  Lab 03/17/24 0227 03/18/24 0438 03/19/24 0404 03/20/24 0448 03/21/24 0541 03/22/24 0503 03/23/24 0539  NA 136   < > 134* 130* 133* 129* 130*  K 4.0   < > 3.5 4.1 3.6 4.3 4.0  CL 95*   < > 92* 91* 90* 89* 89*  CO2 28   < > 25 26 27 23 24   GLUCOSE 103*   < > 94 95 83 88 95  BUN 28*   < > 24* 34* 21 28* 34*  CREATININE 6.13*   < > 5.85* 7.78* 5.12* 6.67* 8.05*  CALCIUM  9.4   < > 8.0* 8.1* 8.7* 8.3* 8.2*  PHOS 4.6  --   --   --   --   --   --    < > = values in this interval not displayed.   GFR: Estimated Creatinine Clearance: 5.5 mL/min (A) (by C-G formula based on SCr of 8.05 mg/dL (H)). Liver Function Tests: Recent Labs  Lab 03/16/24 1919 03/17/24 0227  AST 22  --   ALT 14  --   ALKPHOS 170*  --   BILITOT 0.8  --   PROT 7.5  --   ALBUMIN  3.6 3.4*   No results for input(s): LIPASE, AMYLASE in the last 168 hours. No results for input(s): AMMONIA in the last 168 hours. Coagulation Profile: Recent Labs  Lab 03/16/24 1919  INR 1.4*   Cardiac Enzymes: No results for input(s): CKTOTAL, CKMB, CKMBINDEX, TROPONINI in the last 168 hours. BNP (last 3 results) No results for input(s): PROBNP in the last 8760 hours. HbA1C: No results for input(s): HGBA1C in the last 72 hours. CBG: No results for input(s): GLUCAP in the last 168 hours. Lipid Profile: No results for input(s): CHOL, HDL, LDLCALC, TRIG, CHOLHDL, LDLDIRECT in the last 72 hours. Thyroid  Function Tests: No results for input(s): TSH, T4TOTAL, FREET4, T3FREE, THYROIDAB in the last 72 hours. Anemia Panel: No results for input(s): VITAMINB12, FOLATE, FERRITIN, TIBC, IRON , RETICCTPCT in the last 72 hours. Sepsis Labs: Recent Labs  Lab 03/16/24 1955  LATICACIDVEN 2.8*    Recent Results (from the past 240 hours)  Surgical pcr screen     Status: None   Collection Time: 03/21/24  3:05 PM   Specimen: Nasal Mucosa; Nasal Swab   Result Value Ref Range Status   MRSA, PCR NEGATIVE NEGATIVE Final   Staphylococcus aureus NEGATIVE NEGATIVE Final    Comment: (NOTE) The Xpert SA Assay (FDA approved for NASAL specimens in patients 41 years of age and older), is one component of a comprehensive surveillance program. It is not intended to diagnose infection nor to guide or monitor treatment. Performed at [facility], [address], [place], [place] [address]          Radiology Studies: No results found.        Scheduled Meds:  (feeding supplement) PROSource Plus  30 mL Oral BID BM   acetaminophen   1,000 mg Oral Q8H   amiodarone   200 mg Oral Daily   atorvastatin   20 mg Oral QPM   Chlorhexidine  Gluconate Cloth  6 each Topical Q0600   cinacalcet   90 mg Oral QPM   doxercalciferol   4 mcg Intravenous Q M,W,F-HD   ferric citrate   420 mg Oral TID WC   methocarbamol  1,000 mg Oral Q8H   multivitamin  1 tablet Oral QHS   Continuous Infusions:  vancomycin        LOS: 7 days        [place] Camps J Trace Wirick, MD [facility] Available via Epic secure chat 7am-7pm After these hours, please refer to coverage provider listed on [email or web address] 03/23/2024, 3:15 PM

## 2024-03-23 NOTE — Interval H&P Note (Signed)
 History and Physical Interval Note:  03/23/2024 4:05 PM  Gloria Gallagher  has presented today for surgery, with the diagnosis of cervical 2 fx.  The various methods of treatment have been discussed with the patient and family. After consideration of risks, benefits and other options for treatment, the patient has consented to  Procedure(s) with comments: POSTERIOR CERVICAL FUSION/FORAMINOTOMY LEVEL 1 (N/A) - C1-2 POSTERIOR CERVICAL FUSION as a surgical intervention.  The patient's history has been reviewed, patient examined, no change in status, stable for surgery.  I have reviewed the patient's chart and labs.  Questions were answered to the patient's satisfaction.     Victory [name]

## 2024-03-23 NOTE — Anesthesia Preprocedure Evaluation (Signed)
 Anesthesia Evaluation  Patient identified by MRN, date of birth, ID band Patient awake    Reviewed: Allergy & Precautions, NPO status , Patient's Chart, lab work & pertinent test results, reviewed documented beta blocker date and time   History of Anesthesia Complications Negative for: history of anesthetic complications  Airway Mallampati: III  TM Distance: >3 FB Neck ROM: Limited    Dental  (+) Poor Dentition   Pulmonary pneumonia, former smoker   breath sounds clear to auscultation       Cardiovascular hypertension, + CAD and + Peripheral Vascular Disease  + Valvular Problems/Murmurs  Rhythm:Regular Rate:Normal  IMPRESSIONS     1. Left ventricular ejection fraction, by estimation, is 60 to 65%. The  left ventricle has normal function. The left ventricle has no regional  wall motion abnormalities. The left ventricular internal cavity size was  dilated.   2. Right ventricular systolic function is normal. The right ventricular  size is normal. Mildly increased right ventricular wall thickness. There  is mildly elevated pulmonary artery systolic pressure. The estimated right  ventricular systolic pressure is  37.4 mmHg.   3. Left atrial size was severely dilated.   4. Right atrial size was severely dilated.   5. The mitral valve is degenerative. Moderate mitral valve regurgitation.  No evidence of mitral stenosis. Moderate to severe mitral annular  calcification.   6. The aortic valve is tricuspid. Aortic valve regurgitation is mild to  moderate. Aortic valve sclerosis is present, with no evidence of aortic  valve stenosis.   7. Aortic dilatation noted. There is dilatation of the aortic root,  measuring 39 mm. There is dilatation of the ascending aorta, measuring 45  mm.   8. The inferior vena cava is normal in size with <50% respiratory  variability, suggesting right atrial pressure of 8 mmHg.       Neuro/Psych IMPRESSION: 1. 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 Recommend C-collar placement and noncontrast MRI of the cervical spine for further evaluation. 2. No acute fracture. 3. These findings were discussed with Dr. Pamella over the phone with Dr. Margarite on 04/12/2024 at 08:09 pm .   Neuromuscular disease  C-spine not cleared    GI/Hepatic ,GERD  ,,  Endo/Other    Renal/GU Renal disease     Musculoskeletal  (+) Arthritis ,    Abdominal   Peds  Hematology  (+) Blood dyscrasia, anemia   Anesthesia Other Findings   Reproductive/Obstetrics                              Anesthesia Physical Anesthesia Plan  ASA: 3  Anesthesia Plan: General   Post-op Pain Management:    Induction: Intravenous  PONV Risk Score and Plan: 2 and Ondansetron  and Dexamethasone   Airway Management Planned: Oral ETT, Video Laryngoscope Planned and Fiberoptic Intubation Planned  Additional Equipment:   Intra-op Plan:   Post-operative Plan: Extubation in OR  Informed Consent: I have reviewed the patients History and Physical, chart, labs and discussed the procedure including the risks, benefits and alternatives for the proposed anesthesia with the patient or authorized representative who has indicated his/her understanding and acceptance.     Dental advisory given  Plan Discussed with: CRNA  Anesthesia Plan Comments:         Anesthesia Quick Evaluation

## 2024-03-23 NOTE — Transfer of Care (Signed)
 Immediate Anesthesia Transfer of Care Note  Patient: Gloria Gallagher  Procedure(s) Performed: POSTERIOR CERVICAL FUSION/FORAMINOTOMY CERVICAL ONE-TWO  Patient Location: PACU  Anesthesia Type:General  Level of Consciousness: awake, alert , and patient cooperative  Airway & Oxygen  Therapy: Patient Spontanous Breathing and Patient connected to face mask oxygen   Post-op Assessment: Report given to RN and Post -op Vital signs reviewed and stable  Post vital signs: Reviewed and stable  Last Vitals:  Vitals Value Taken Time  BP 149/72 03/23/24 20:06  Temp 36.6 C 03/23/24 20:06  Pulse 88 03/23/24 20:10  Resp 18 03/23/24 20:10  SpO2 98 % 03/23/24 20:10  Vitals shown include unfiled device data.  Last Pain:  Vitals:   03/23/24 1541  TempSrc: Oral  PainSc: 0-No pain         Complications: No notable events documented.

## 2024-03-23 NOTE — Brief Op Note (Signed)
 03/23/2024  7:42 PM  PATIENT:  Gloria Gallagher  73 y.o. female  PRE-OPERATIVE DIAGNOSIS:  cervical 2 fx  POST-OPERATIVE DIAGNOSIS:  cervical 2 fx  PROCEDURE:  Procedure(s) with comments: POSTERIOR CERVICAL FUSION/FORAMINOTOMY CERVICAL ONE-TWO (N/A) - C1-2 POSTERIOR CERVICAL FUSION  SURGEON:  Surgeons and Role:    DEWAINE Louis Shove, MD - Primary  PHYSICIAN ASSISTANT:   ASSISTANTSBETHA Jennetta PIETY   ANESTHESIA:   general  EBL:  200 mL   BLOOD ADMINISTERED:none  DRAINS: none   LOCAL MEDICATIONS USED:  NONE  SPECIMEN:  No Specimen  DISPOSITION OF SPECIMEN:  N/A  COUNTS:  YES  TOURNIQUET:  * No tourniquets in log *  DICTATION: .Dragon Dictation  PLAN OF CARE: Admit to inpatient   PATIENT DISPOSITION:  PACU - hemodynamically stable.   Delay start of Pharmacological VTE agent (>24hrs) due to surgical blood loss or risk of bleeding: yes

## 2024-03-24 DIAGNOSIS — W19XXXA Unspecified fall, initial encounter: Secondary | ICD-10-CM | POA: Diagnosis not present

## 2024-03-24 DIAGNOSIS — S32509D Unspecified fracture of unspecified pubis, subsequent encounter for fracture with routine healing: Secondary | ICD-10-CM

## 2024-03-24 LAB — BASIC METABOLIC PANEL WITH GFR
Anion gap: 18 — ABNORMAL HIGH (ref 5–15)
BUN: 24 mg/dL — ABNORMAL HIGH (ref 8–23)
CO2: 23 mmol/L (ref 22–32)
Calcium: 8.8 mg/dL — ABNORMAL LOW (ref 8.9–10.3)
Chloride: 89 mmol/L — ABNORMAL LOW (ref 98–111)
Creatinine, Ser: 6.28 mg/dL — ABNORMAL HIGH (ref 0.44–1.00)
GFR, Estimated: 7 mL/min — ABNORMAL LOW (ref >60)
Glucose, Bld: 119 mg/dL — ABNORMAL HIGH (ref 70–99)
Potassium: 4.7 mmol/L (ref 3.5–5.1)
Sodium: 130 mmol/L — ABNORMAL LOW (ref 135–145)

## 2024-03-24 LAB — CBC WITH DIFFERENTIAL/PLATELET
Abs Immature Granulocytes: 0.03 K/uL (ref 0.00–0.07)
Basophils Absolute: 0 K/uL (ref 0.0–0.1)
Basophils Relative: 0 %
Eosinophils Absolute: 0 K/uL (ref 0.0–0.5)
Eosinophils Relative: 0 %
HCT: 37.5 % (ref 36.0–46.0)
Hemoglobin: 12.1 g/dL (ref 12.0–15.0)
Immature Granulocytes: 1 %
Lymphocytes Relative: 4 %
Lymphs Abs: 0.2 K/uL — ABNORMAL LOW (ref 0.7–4.0)
MCH: 30.4 pg (ref 26.0–34.0)
MCHC: 32.3 g/dL (ref 30.0–36.0)
MCV: 94.2 fL (ref 80.0–100.0)
Monocytes Absolute: 0.5 K/uL (ref 0.1–1.0)
Monocytes Relative: 8 %
Neutro Abs: 5.3 K/uL (ref 1.7–7.7)
Neutrophils Relative %: 87 %
Platelets: 186 K/uL (ref 150–400)
RBC: 3.98 MIL/uL (ref 3.87–5.11)
RDW: 15.7 % — ABNORMAL HIGH (ref 11.5–15.5)
WBC: 6.1 K/uL (ref 4.0–10.5)
nRBC: 0 % (ref 0.0–0.2)

## 2024-03-24 MED ORDER — CHLORHEXIDINE GLUCONATE CLOTH 2 % EX PADS
6.0000 | MEDICATED_PAD | Freq: Every day | CUTANEOUS | Status: DC
  Administered 2024-03-25 – 2024-03-26 (×2): 6 via TOPICAL

## 2024-03-24 MED ORDER — ACETAMINOPHEN 500 MG PO TABS
1000.0000 mg | ORAL_TABLET | Freq: Three times a day (TID) | ORAL | Status: DC
  Administered 2024-03-24 – 2024-03-28 (×12): 1000 mg via ORAL
  Filled 2024-03-24 (×11): qty 2

## 2024-03-24 NOTE — Progress Notes (Signed)
 Physical Therapy Treatment Patient Details Name: Gloria Gallagher MRN: [id number] DOB: 07/06/1950 Today's Date: 03/24/2024   History of Present Illness The pt is a 73 yo female presenting 11/3 after a fall on blood thinners. CT showed C1-2 subluxation, neurosurgery consulted, and L-sided pubic rami fx. S/p Posterior cervical fusion C1-C2 11/10. PMH includes: anemia, arthritis, afib, CAD, ESRD on HD MWF, and neuropathy.    PT Comments  Pt is progressing well towards goals. Mild improvement in mobility after recent surgery. Pt currently is supervision for sit to stand and CGA to Min A for short in-home distance gait with RW. Pt has supportive family. Due to pt current functional status, home set up and available assistance at home recommending skilled physical therapy services 3x/week in order to address strength, balance and functional mobility to decrease risk for falls, injury and re-hospitalization.      If plan is discharge home, recommend the following: A little help with walking and/or transfers;Assistance with cooking/housework;Help with stairs or ramp for entrance;Assist for transportation;A little help with bathing/dressing/bathroom     Equipment Recommendations  Wheelchair (measurements PT);Wheelchair cushion (measurements PT)       Precautions / Restrictions Precautions Precautions: Cervical;Fall Precaution Booklet Issued: No Recall of Precautions/Restrictions: Intact Precaution/Restrictions Comments: Pt recalled cervical precautions. Soft collar: May remove when in bed: Yes; May ambulate to bathroom without brace: Yes; Apply/Remove Brace: While sitting; May remove brace to shower: Yes Required Braces or Orthoses: Cervical Brace Cervical Brace: Soft collar Restrictions Weight Bearing Restrictions Per Provider Order: Yes LLE Weight Bearing Per Provider Order: Weight bearing as tolerated     Mobility  Bed Mobility     General bed mobility comments: Pt sitting in recliner  at beginning and end of session    Transfers Overall transfer level: Needs assistance Equipment used: Rolling walker (2 wheels) Transfers: Sit to/from Stand Sit to Stand: Supervision    Ambulation/Gait Ambulation/Gait assistance: Contact guard assist, Min assist Gait Distance (Feet): 24 Feet Assistive device: Rolling walker (2 wheels) Gait Pattern/deviations: Step-through pattern, Decreased step length - right, Decreased step length - left, Decreased stride length, Decreased stance time - left, Decreased weight shift to left, Antalgic, Trunk flexed Gait velocity: decreased Gait velocity interpretation: <1.31 ft/sec, indicative of household ambulator   General Gait Details: Pt able to stand upright with cues, fatigues quickly initially CGA, then intermittent Min A including for steering. Pt has trouble focusing on one thing while trying to ambulate and was getting distracted by family in room started running into bed/chair      Balance Overall balance assessment: Needs assistance Sitting-balance support: Bilateral upper extremity supported, Feet supported Sitting balance-Leahy Scale: Fair     Standing balance support: Bilateral upper extremity supported, Reliant on assistive device for balance Standing balance-Leahy Scale: Poor Standing balance comment: Pt dependent on RW after short distance gait      Communication Communication Communication: No apparent difficulties  Cognition Arousal: Alert Behavior During Therapy: WFL for tasks assessed/performed   PT - Cognitive impairments: Sequencing, Safety/Judgement, No apparent impairments     Following commands: Intact      Cueing Cueing Techniques: Verbal cues     General Comments General comments (skin integrity, edema, etc.): Family in room, no signs/symptoms of cardiac/respiratory distress during session      Pertinent Vitals/Pain Pain Assessment Pain Assessment: Faces Faces Pain Scale: Hurts little more Pain  Location: surgical site Pain Descriptors / Indicators: Grimacing, Sore Pain Intervention(s): Limited activity within patient's tolerance, Monitored during session, Repositioned  PT Goals (current goals can now be found in the care plan section) Acute Rehab PT Goals Patient Stated Goal: Have less pain and get rid of this brace. PT Goal Formulation: With patient Time For Goal Achievement: 03/31/24 Potential to Achieve Goals: Good Progress towards PT goals: Progressing toward goals    Frequency    Min 2X/week      PT Plan  Continue with current POC        AM-PAC PT 6 Clicks Mobility   Outcome Measure  Help needed turning from your back to your side while in a flat bed without using bedrails?: A Little Help needed moving from lying on your back to sitting on the side of a flat bed without using bedrails?: A Little Help needed moving to and from a bed to a chair (including a wheelchair)?: A Little Help needed standing up from a chair using your arms (e.g., wheelchair or bedside chair)?: A Little Help needed to walk in hospital room?: A Little Help needed climbing 3-5 steps with a railing? : A Lot 6 Click Score: 17    End of Session Equipment Utilized During Treatment: Cervical collar Activity Tolerance: Patient tolerated treatment well;Patient limited by pain;Patient limited by fatigue Patient left: in chair;with call bell/phone within reach;with family/visitor present Nurse Communication: Mobility status PT Visit Diagnosis: Unsteadiness on feet (R26.81);Other abnormalities of gait and mobility (R26.89);Muscle weakness (generalized) (M62.81);History of falling (Z91.81);Pain Pain - Right/Left: Left Pain - part of body: Hip     Time: 8647-8590 PT Time Calculation (min) (ACUTE ONLY): 17 min  Charges:    $Therapeutic Activity: 8-22 mins PT General Charges $$ ACUTE PT VISIT: 1 Visit                     Dorothyann Maier, DPT, CLT  Acute Rehabilitation  Services Office: [phone] (Secure chat preferred)    Dorothyann VEAR Maier 03/24/2024, 3:57 PM

## 2024-03-24 NOTE — Progress Notes (Addendum)
 PROGRESS NOTE    Gloria Gallagher  FMW:[id number] DOB: 03/12/1951 DOA: 03/16/2024 PCP: Campbell Reynolds, NP    Brief Narrative:  Gloria Gallagher is a 73 y.o. female with medical history significant for ESRD on HD MWF, PVD, PAF on Eliquis , chronic HFpEF, who presents to the ER after a mechanical fall at home. The patient was attempting to hit a fly, lost her balance and fell, LOC. Level 2 trauma was activated due to the patient being on blood thinners. Report pelvic pain. Left-sided pubic rami fracture and C1-C2 subluxation revealed on CT scan.  Seen by trauma team and orthopedic surgery.  Recommended nonoperative management.  Weightbearing as tolerated.  Return to clinic in 4 weeks for x-ray following discharge.  Pain control.  Discussed the case with neurosurgery, recommended to keep the patient on cervical collar and plan for surgery on 03/23/24     Assessment and Plan: Fall, mechanical, POA Continue pain control and fall precautions Continue to keep the patient on cervical collar as recommended by neurosurgery PT OT evaluation   C1-C2 subluxation, POA Remote ununited displaced type II dens fracture with mild posterior subluxation of C2 relative to the lateral masses of C1, indeterminate acuity given Motion limited study without definitive edema or fluid collection to suggest acute ligamentous injury Neurosurgery consulted, status post posterior C1-2 fusion surgery on 11/10 TTE ordered for preoperative evaluation-showed EF of 60 to 65%, no regional wall motion abnormality, right ventricular systolic function is normal, mildly elevated pulmonary artery systolic pressure Pt ok to mobilize with soft cervical collar.   Left-sided pubic rami fracture, POA Weightbearing as tolerated PT OT evaluation under the guidance of orthopedic surgery Follow-up outpatient 4 weeks after discharge for repeat imaging Pain management with Tylenol  scheduled, reports opioids making her nauseous   Paroxysmal  A-fib on Eliquis  Continue amiodarone  for rate control Hold Eliquis  until neurosurgery is okay for patient to restart [facility] Continue to monitor on telemetry   PVD Resume antiplatelets when okay with neurosurgery. Resume home statin   ESRD on HD MWF Hyponatremia Nephrology on board    DVT prophylaxis: SCD's Start: 03/23/24 2117 SCDs Start: 03/16/24 2326    Code Status: Full Code Family Communication: None at bedside  Disposition Plan:  Level of care: Telemetry Status is: Inpatient     Consultants:  Trauma Ortho Neurosurgery   Subjective: Patient denies any new complaints.   Objective: Vitals:   03/24/24 0627 03/24/24 1011 03/24/24 1400 03/24/24 1757  BP: (!) 141/60 138/68 118/78 (!) 127/59  Pulse: 68 63    Resp: 18 18 17 18   Temp: (!) 97.4 F (36.3 C) (!) 97.5 F (36.4 C) 98.1 F (36.7 C) (!) 97.5 F (36.4 C)  TempSrc: Oral Oral Oral   SpO2:   98%   Weight:      Height:        Intake/Output Summary (Last 24 hours) at 03/24/2024 1937 Last data filed at 03/24/2024 1500 Gross per 24 hour  Intake 1681.24 ml  Output --  Net 1681.24 ml    Filed Weights   03/23/24 0821 03/23/24 0914 03/23/24 1150  Weight: 64.7 kg 64.7 kg 63.9 kg    Examination: General: NAD, noted cervical collar Cardiovascular: S1, S2 present Respiratory: CTAB Abdomen: Soft, nontender, nondistended, bowel sounds present Musculoskeletal: No bilateral pedal edema noted Skin: Normal Psychiatry: Normal mood       Data Reviewed: I have personally reviewed following labs and imaging studies  CBC: Recent Labs  Lab 03/18/24 0438 03/19/24 0404  03/20/24 0448 03/23/24 0539 03/23/24 1551 03/24/24 0859  WBC 3.5* 4.4 4.1 3.4*  --  6.1  NEUTROABS  --  2.9 2.3 2.1  --  5.3  HGB 11.4* 11.0* 11.2* 11.1* 13.6 12.1  HCT 35.8* 35.4* 34.6* 33.9* 40.0 37.5  MCV 95.2 95.4 94.3 92.6  --  94.2  PLT 126* 134* 134* 149*  --  186   Basic Metabolic Panel: Recent Labs  Lab 03/20/24 0448  03/21/24 0541 03/22/24 0503 03/23/24 0539 03/23/24 1551 03/24/24 0859  NA 130* 133* 129* 130* 131* 130*  K 4.1 3.6 4.3 4.0 4.3 4.7  CL 91* 90* 89* 89* 90* 89*  CO2 26 27 23 24   --  23  GLUCOSE 95 83 88 95 91 119*  BUN 34* 21 28* 34* 25* 24*  CREATININE 7.78* 5.12* 6.67* 8.05* 5.30* 6.28*  CALCIUM  8.1* 8.7* 8.3* 8.2*  --  8.8*   GFR: Estimated Creatinine Clearance: 7.1 mL/min (A) (by C-G formula based on SCr of 6.28 mg/dL (H)). Liver Function Tests: No results for input(s): AST, ALT, ALKPHOS, BILITOT, PROT, ALBUMIN  in the last 168 hours.  No results for input(s): LIPASE, AMYLASE in the last 168 hours. No results for input(s): AMMONIA in the last 168 hours. Coagulation Profile: No results for input(s): INR, PROTIME in the last 168 hours.  Cardiac Enzymes: No results for input(s): CKTOTAL, CKMB, CKMBINDEX, TROPONINI in the last 168 hours. BNP (last 3 results) No results for input(s): PROBNP in the last 8760 hours. HbA1C: No results for input(s): HGBA1C in the last 72 hours. CBG: No results for input(s): GLUCAP in the last 168 hours. Lipid Profile: No results for input(s): CHOL, HDL, LDLCALC, TRIG, CHOLHDL, LDLDIRECT in the last 72 hours. Thyroid  Function Tests: No results for input(s): TSH, T4TOTAL, FREET4, T3FREE, THYROIDAB in the last 72 hours. Anemia Panel: No results for input(s): VITAMINB12, FOLATE, FERRITIN, TIBC, IRON , RETICCTPCT in the last 72 hours. Sepsis Labs: No results for input(s): PROCALCITON, LATICACIDVEN in the last 168 hours.   Recent Results (from the past 240 hours)  Surgical pcr screen     Status: None   Collection Time: 03/21/24  3:05 PM   Specimen: Nasal Mucosa; Nasal Swab  Result Value Ref Range Status   MRSA, PCR NEGATIVE NEGATIVE Final   Staphylococcus aureus NEGATIVE NEGATIVE Final    Comment: (NOTE) The Xpert SA Assay (FDA approved for NASAL specimens in patients  71 years of age and older), is one component of a comprehensive surveillance program. It is not intended to diagnose infection nor to guide or monitor treatment. Performed at [facility], [address], [place], [place] [address]          Radiology Studies: DG Cervical Spine 2 or 3 views Result Date: 03/23/2024 EXAM: FLUOROSCOPIC IMAGES, 2 or 3 Views TECHNIQUE: Fluoroscopy was provided by the radiology department for procedure. Radiologist was not present during examination. FLUOROSCOPY DOSE AND TYPE: Radiation Dose Index: Reference Air Kerma (in mGy) = 3.35 Fluoroscopy Time: 46 seconds Total Images: 3 COMPARISON: None available. CLINICAL HISTORY: [id number] Surgery, elective [id number]. FINDINGS: Intraoperative fluoroscopic imaging was performed. Known fracture at the base of the odontoid is well aligned on the initial images. Subsequent posterior fusion was performed at C1-C2. IMPRESSION: 1. Well-aligned known fracture at the base of the odontoid. 2. Status post posterior C1C2 fusion placement. NOTE: Intraoperative fluoroscopic spot images as above. Please refer to the intraoperative report for full details. Electronically signed by: Oneil Devonshire MD 03/23/2024 08:20 PM  EST RP Workstation: GROUP 1 [email or web address]   DG C-Arm 1-60 Min-No Report Result Date: 03/23/2024 Fluoroscopy was utilized by the requesting physician.  No radiographic interpretation.   DG C-Arm 1-60 Min-No Report Result Date: 03/23/2024 Fluoroscopy was utilized by the requesting physician.  No radiographic interpretation.   DG C-Arm 1-60 Min-No Report Result Date: 03/23/2024 Fluoroscopy was utilized by the requesting physician.  No radiographic interpretation.          Scheduled Meds:  (feeding supplement) PROSource Plus  30 mL Oral BID BM   acetaminophen   1,000 mg Oral Q8H   amiodarone   200 mg Oral Daily   atorvastatin   20 mg Oral QPM   Chlorhexidine  Gluconate Cloth  6 each Topical Q0600   [START ON 03/25/2024]  Chlorhexidine  Gluconate Cloth  6 each Topical Q0600   cinacalcet   90 mg Oral QPM   doxercalciferol   4 mcg Intravenous Q M,W,F-HD   ferric citrate   420 mg Oral TID WC   methocarbamol  1,000 mg Oral Q8H   multivitamin  1 tablet Oral QHS   sodium chloride  flush  3 mL Intravenous Q12H   Continuous Infusions:  sodium chloride  Stopped (03/24/24 0603)     LOS: 8 days        Lebron JINNY Cage, MD [facility] Available via Epic secure chat 7am-7pm After these hours, please refer to coverage provider listed on [email or web address] 03/24/2024, 7:37 PM

## 2024-03-24 NOTE — Progress Notes (Signed)
 Assisted patient from bed to chair with walker.Still on the chair.Call light within reach.

## 2024-03-24 NOTE — Progress Notes (Signed)
 Occupational Therapy Treatment Patient Details Name: Gloria Gallagher MRN: [id number] DOB: [birth year] Today's Date: 03/24/2024   History of present illness The pt is a 73 yo female presenting 11/3 after a fall on blood thinners. CT showed C1-2 subluxation, neurosurgery consulted, and Gallagher-sided pubic rami fx. S/p Posterior cervical fusion C1-C2 11/10. PMH includes: anemia, arthritis, afib, CAD, ESRD on HD MWF, and neuropathy.   OT comments  Pt seen for OT session following C1-C2 cervical fusion. Pt endorses no change in functional abilities following procedure. Focus of session on reinforcing education of cervical precautions during occupational tasks, providing education on soft collar, and increasing independence in ADL tasks. Pt demonstrated improved ability to complete LB dressing tasks. OT to continue per POC, current d/c recommendation remains appropriate.       If plan is discharge home, recommend the following:  A little help with walking and/or transfers;A lot of help with bathing/dressing/bathroom;Assistance with cooking/housework;Assist for transportation;Help with stairs or ramp for entrance   Equipment Recommendations  None recommended by OT    Recommendations for Other Services      Precautions / Restrictions Precautions Precautions: Cervical;Fall Precaution Booklet Issued: Yes (comment) Recall of Precautions/Restrictions: Intact Precaution/Restrictions Comments: Pt recalled cervical precautions. Soft collar: May remove when in bed: Yes; May ambulate to bathroom without brace: Yes; Apply/Remove Brace: While sitting; May remove brace to shower: Yes Required Braces or Orthoses: Cervical Brace Cervical Brace: Soft collar Restrictions Weight Bearing Restrictions Per Provider Order: Yes LLE Weight Bearing Per Provider Order: Weight bearing as tolerated       Mobility Bed Mobility               General bed mobility comments: Pt greeted in recliner and requested to  remain in recliner for session. Pt stated that she had utilized log roll technique to get out of bed with staff and continues to require assistance.    Transfers                   General transfer comment: Pt declining tranfers out of recliner this session. Endorses that current functional mobility has remained at level prior to surgery.     Balance Overall balance assessment: Needs assistance Sitting-balance support: Bilateral upper extremity supported, Feet supported Sitting balance-Leahy Scale: Fair Sitting balance - Comments: Pt able to maintain seated position in recliner without posterior support for limited duration.                                   ADL either performed or assessed with clinical judgement   ADL Overall ADL's : Needs assistance/impaired Eating/Feeding: Independent;Sitting                   Lower Body Dressing: Minimal assistance Lower Body Dressing Details (indicate cue type and reason): Pt able to utilize figure four position for LB dressing   Toilet Transfer Details (indicate cue type and reason): Pt endorses toilet transfer with supervision. Not assessed this session           General ADL Comments: Pt politely declined toileting tasks this session d/t just having come back from bathroom with nurse.    Extremity/Trunk Assessment Upper Extremity Assessment Upper Extremity Assessment: Overall WFL for tasks assessed            Vision   Vision Assessment?: No apparent visual deficits   Perception     Praxis  Communication Communication Communication: No apparent difficulties   Cognition Arousal: Lethargic Behavior During Therapy: WFL for tasks assessed/performed Cognition: No apparent impairments                               Following commands: Intact        Cueing   Cueing Techniques: Verbal cues, Visual cues  Exercises      Shoulder Instructions       General Comments Pt educated  on soft collar wear schedule and proper fit. OT adjusted collar for chin to align with fitted space. Pt vebalized understanding of when to don collar and appropriate times to doff. Pt states that there has not been a signifiant change in functional abilities following surgery.    Pertinent Vitals/ Pain       Pain Assessment Pain Assessment: No/denies pain Pain Intervention(s): Monitored during session  Home Living                                          Prior Functioning/Environment              Frequency  Min 2X/week        Progress Toward Goals  OT Goals(current goals can now be found in the care plan section)  Progress towards OT goals: Progressing toward goals  Acute Rehab OT Goals Patient Stated Goal: to get better OT Goal Formulation: With patient Time For Goal Achievement: 03/31/24 Potential to Achieve Goals: Good ADL Goals Pt Will Perform Eating: with supervision;with set-up;sitting Pt Will Perform Grooming: with set-up;with supervision;sitting;standing Pt Will Perform Upper Body Bathing: with supervision;with set-up;sitting Pt Will Perform Lower Body Bathing: with set-up;with supervision;sit to/from stand Pt Will Perform Upper Body Dressing: with set-up;with supervision;sitting Pt Will Perform Lower Body Dressing: with supervision;with set-up;sit to/from stand Pt Will Transfer to Toilet: with supervision;ambulating;bedside commode Pt Will Perform Toileting - Clothing Manipulation and hygiene: with supervision;sit to/from stand Additional ADL Goal #1: Pt will be S in and out of recliner Additional ADL Goal #2: sister will be aware of wear and care of collar  Plan      Co-evaluation                 AM-PAC OT 6 Clicks Daily Activity     Outcome Measure   Help from another person eating meals?: None Help from another person taking care of personal grooming?: A Little Help from another person toileting, which includes using toliet,  bedpan, or urinal?: A Little Help from another person bathing (including washing, rinsing, drying)?: A Little Help from another person to put on and taking off regular upper body clothing?: A Little Help from another person to put on and taking off regular lower body clothing?: A Little 6 Click Score: 19    End of Session Equipment Utilized During Treatment: Cervical collar  OT Visit Diagnosis: Unsteadiness on feet (R26.81);Other abnormalities of gait and mobility (R26.89);Pain;Muscle weakness (generalized) (M62.81) Pain - Right/Left: Left Pain - part of body: Leg   Activity Tolerance Patient limited by fatigue   Patient Left in chair;with call bell/phone within reach   Nurse Communication          Time: 8866-8852 OT Time Calculation (min): 14 min  Charges: OT General Charges $OT Visit: 1 Visit OT Treatments $Therapeutic Activity: 8-22 mins  Gloria Gallagher, OTR/Gallagher.  [facility]  Office: [phone]   Gloria Gallagher 03/24/2024, 1:36 PM

## 2024-03-24 NOTE — Progress Notes (Signed)
 Postop day 1.  Overall patient doing reasonably well.  Complains of appropriate neck incisional soreness.  No radiating pain numbness or weakness.  She is afebrile.  Her vital signs are stable.  She is awake and alert.  Her motor and sensory function are stable.  Wound dressing clean and dry.  Chest and abdomen benign.  Progressing well following posterior C1-2 fusion.  Continue efforts at mobilization and pain control.  Okay to work toward discharge from my standpoint.  I will be out of town through the end of the week.  If any questions please direct them to my nurse practitioner Duwaine Beck

## 2024-03-24 NOTE — Progress Notes (Signed)
 [facility] Progress Note  Name: Gloria Gallagher MRN: [id number] DOB: [birth year]  Subjective:  Saw in room In good spirits  Vitals:  Vitals:   03/23/24 2101 03/24/24 0200 03/24/24 0627 03/24/24 1011  BP: 139/65 (!) 151/102 (!) 141/60 138/68  Pulse: 93 80 68 63  Resp: 19 18 18 18   Temp: 97.6 F (36.4 C) (!) 97.4 F (36.3 C) (!) 97.4 F (36.3 C) (!) 97.5 F (36.4 C)  TempSrc: Oral Oral Oral Oral  SpO2: 93% 100%    Weight:      Height:         Physical Exam:    General adult female in bed in no acute distress  Neck in soft c-collar Lungs CTA bilat, 2L [place] O2 Heart S1S2 no rub Abdomen soft nontender nondistended Ext: no edema Neuro: alert and Ox3  Access: LUE AVF+b/t    OP HD: [facility] M[facility] 3h  B400   66.4kg    2K bath  AVF  Heparin  none Very good compliance Gets to dry wt, ave wg= 1- 3kg    Assessment/Plan:   # 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  - S/P neck surgery Monday 11/10  - per neurosurgery  # Fall with Left pubic rami fracture - Per orthopedic surgery - PT has seen   # ESRD - HD MWF -  HD tomorrow  # Hypertension/ volume - admit 11/03 cxr was clear, also CT chest w/ no edema  - 2-3 kg under dry wt - BP's a bit high off and on - cont to lower vol w/ HD as tolerated - lower dry wt on dc    #Anemia of CKD - No ESA indicated   # Metabolic bone disease - continue Hectorol  and Sensipar  - auryxia  for hyperphosphatemia   Myer Fret  MD  [facility] 03/24/2024, 2:14 PM  Recent Labs  Lab 03/23/24 0539 03/23/24 1551 03/24/24 0859  HGB 11.1* 13.6 12.1  CALCIUM  8.2*  --  8.8*  CREATININE 8.05* 5.30* 6.28*  K 4.0 4.3 4.7    Inpatient medications:  (feeding supplement) PROSource Plus  30 mL Oral BID BM   amiodarone   200 mg Oral Daily   atorvastatin   20 mg Oral QPM   Chlorhexidine  Gluconate Cloth  6 each Topical Q0600   cinacalcet   90 mg Oral QPM   doxercalciferol   4 mcg Intravenous Q M,W,F-HD   ferric citrate    420 mg Oral TID WC   methocarbamol  1,000 mg Oral Q8H   multivitamin  1 tablet Oral QHS   sodium chloride  flush  3 mL Intravenous Q12H    sodium chloride  Stopped (03/24/24 0603)   hydrALAZINE , HYDROmorphone  (DILAUDID ) injection, menthol **OR** phenol, oxyCODONE , prochlorperazine, sodium chloride  flush

## 2024-03-24 NOTE — Progress Notes (Signed)
 Complained of dry and sore throat. Ice chips provided.

## 2024-03-25 ENCOUNTER — Encounter (HOSPITAL_COMMUNITY): Payer: Self-pay | Admitting: Neurosurgery

## 2024-03-25 DIAGNOSIS — S32509D Unspecified fracture of unspecified pubis, subsequent encounter for fracture with routine healing: Secondary | ICD-10-CM | POA: Diagnosis not present

## 2024-03-25 DIAGNOSIS — I480 Paroxysmal atrial fibrillation: Secondary | ICD-10-CM

## 2024-03-25 LAB — CBC WITH DIFFERENTIAL/PLATELET
Abs Immature Granulocytes: 0.06 K/uL (ref 0.00–0.07)
Basophils Absolute: 0.1 K/uL (ref 0.0–0.1)
Basophils Relative: 1 %
Eosinophils Absolute: 0 K/uL (ref 0.0–0.5)
Eosinophils Relative: 0 %
HCT: 35.3 % — ABNORMAL LOW (ref 36.0–46.0)
Hemoglobin: 11.3 g/dL — ABNORMAL LOW (ref 12.0–15.0)
Immature Granulocytes: 1 %
Lymphocytes Relative: 10 %
Lymphs Abs: 0.6 K/uL — ABNORMAL LOW (ref 0.7–4.0)
MCH: 30.1 pg (ref 26.0–34.0)
MCHC: 32 g/dL (ref 30.0–36.0)
MCV: 94.1 fL (ref 80.0–100.0)
Monocytes Absolute: 1 K/uL (ref 0.1–1.0)
Monocytes Relative: 15 %
Neutro Abs: 4.7 K/uL (ref 1.7–7.7)
Neutrophils Relative %: 73 %
Platelets: 166 K/uL (ref 150–400)
RBC: 3.75 MIL/uL — ABNORMAL LOW (ref 3.87–5.11)
RDW: 16 % — ABNORMAL HIGH (ref 11.5–15.5)
WBC: 6.4 K/uL (ref 4.0–10.5)
nRBC: 0 % (ref 0.0–0.2)

## 2024-03-25 LAB — BASIC METABOLIC PANEL WITH GFR
Anion gap: 18 — ABNORMAL HIGH (ref 5–15)
BUN: 31 mg/dL — ABNORMAL HIGH (ref 8–23)
CO2: 22 mmol/L (ref 22–32)
Calcium: 8.7 mg/dL — ABNORMAL LOW (ref 8.9–10.3)
Chloride: 90 mmol/L — ABNORMAL LOW (ref 98–111)
Creatinine, Ser: 7.25 mg/dL — ABNORMAL HIGH (ref 0.44–1.00)
GFR, Estimated: 5 mL/min — ABNORMAL LOW (ref >60)
Glucose, Bld: 103 mg/dL — ABNORMAL HIGH (ref 70–99)
Potassium: 4.5 mmol/L (ref 3.5–5.1)
Sodium: 130 mmol/L — ABNORMAL LOW (ref 135–145)

## 2024-03-25 MED ORDER — DOXERCALCIFEROL 4 MCG/2ML IV SOLN
INTRAVENOUS | Status: AC
  Filled 2024-03-25: qty 2

## 2024-03-25 MED ORDER — CILOSTAZOL 50 MG PO TABS
50.0000 mg | ORAL_TABLET | Freq: Two times a day (BID) | ORAL | Status: DC
  Administered 2024-03-25 – 2024-03-28 (×6): 50 mg via ORAL
  Filled 2024-03-25 (×7): qty 1

## 2024-03-25 NOTE — Progress Notes (Addendum)
 PROGRESS NOTE   Gloria Gallagher  FMW:[id number]    DOB: 1950-12-21    DOA: 03/16/2024  PCP: Campbell Reynolds, NP   I have briefly reviewed patients previous medical records in [facility].   Brief Hospital Course:   73 y.o. female with medical history significant for ESRD on HD MWF, PVD, PAF on Eliquis , chronic HFpEF, who presents to the ER after a mechanical fall at home. The patient was attempting to hit a fly, lost her balance and fell, LOC. Level 2 trauma was activated due to the patient being on blood thinners. Report pelvic pain. Left-sided pubic rami fracture and C1-C2 subluxation revealed on CT scan.  Seen by trauma team and orthopedic surgery.  Recommended nonoperative management.  Weightbearing as tolerated.  Return to clinic in 4 weeks for x-ray following discharge.  Pain control.  Discussed the case with neurosurgery, recommended to keep the patient on cervical collar and plan for surgery on 03/23/24    Assessment & Plan:   S/p mechanical fall Left inferior and superior pubic rami fracture Trauma and orthopedics consulted.  Nonoperative management.  WBAT LLE. Return to clinic in 4 weeks for x-rays.  Follow-up with Dr. [name]. Therapies evaluated and recommend home health PT and OT.  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 S/p posterior cervical fusion C1-C2 11/10. Management per neurosurgery.  Has C-collar, continue.  Per Dr. Louis note from 11/11, can work towards discharge from his standpoint.  Paroxysmal A-fib Rate controlled on amiodarone . Eliquis  was on hold for surgery.  As per communication with Gloria Gallagher, Neurosurgery PA, they recommend holding anticoagulation for 1 week postop and she is okay with patient being discharged home tomorrow. Long-term anticoagulation decision deferred to cardiology during outpatient follow-up. TTE this admission showed LVEF of 60 to 65%  ESRD on MWF HD Hyponatremia Nephrology  following for HD needs.  Undergoing HD 11/12.  PAD Continue atorvastatin .  Resume Pletal .  Patient not taking ASA or Plavix  PTA.  Body mass index is 23.44 kg/m.   DVT prophylaxis: SCD's Start: 03/23/24 2117 SCDs Start: 03/16/24 2326     Code Status: Full Code:  Family Communication: None at bedside Disposition:  Status is: Inpatient Remains inpatient appropriate because: Hopefully will be medically ready for DC 11/13     Consultants:   Trauma Orthopedics Nephrology Neurosurgery  Procedures:   As noted above  Subjective:  Seen this morning prior to HD.  States that she is doing better.  Neck pain better after surgery.  No tingling, numbness or weakness of extremities.  No other complaints reported.  Objective:   Vitals:   03/25/24 1440 03/25/24 1500 03/25/24 1530 03/25/24 1600  BP: 125/60 (!) 142/105 131/68 123/69  Pulse: 79 75 68 74  Resp: 19 17 17 18   Temp:      TempSrc:      SpO2: 100% 100%  100%  Weight:      Height:        General exam: Elderly female, moderately built and nourished sitting up comfortably in reclining chair without distress.  Head c-collar on. Respiratory system: Clear to auscultation. Respiratory effort normal. Cardiovascular system: S1 & S2 heard, irregularly irregular. No JVD, murmurs, rubs, gallops or clicks. No pedal edema. Gastrointestinal system: Abdomen is nondistended, soft and nontender. No organomegaly or masses felt. Normal bowel sounds heard. Central nervous system: Alert and oriented. No focal neurological deficits. Extremities: Symmetric 5 x 5 power. Skin: No rashes, lesions or ulcers Psychiatry: Judgement  and insight appear normal. Mood & affect appropriate.     Data Reviewed:   I have personally reviewed following labs and imaging studies   CBC: Recent Labs  Lab 03/23/24 0539 03/23/24 1551 03/24/24 0859 03/25/24 0431  WBC 3.4*  --  6.1 6.4  NEUTROABS 2.1  --  5.3 4.7  HGB 11.1* 13.6 12.1 11.3*  HCT 33.9* 40.0  37.5 35.3*  MCV 92.6  --  94.2 94.1  PLT 149*  --  186 166    Basic Metabolic Panel: Recent Labs  Lab 03/21/24 0541 03/22/24 0503 03/23/24 0539 03/23/24 1551 03/24/24 0859 03/25/24 0431  NA 133* 129* 130* 131* 130* 130*  K 3.6 4.3 4.0 4.3 4.7 4.5  CL 90* 89* 89* 90* 89* 90*  CO2 27 23 24   --  23 22  GLUCOSE 83 88 95 91 119* 103*  BUN 21 28* 34* 25* 24* 31*  CREATININE 5.12* 6.67* 8.05* 5.30* 6.28* 7.25*  CALCIUM  8.7* 8.3* 8.2*  --  8.8* 8.7*    Liver Function Tests: No results for input(s): AST, ALT, ALKPHOS, BILITOT, PROT, ALBUMIN  in the last 168 hours.  CBG: No results for input(s): GLUCAP in the last 168 hours.  Microbiology Studies:   Recent Results (from the past 240 hours)  Surgical pcr screen     Status: None   Collection Time: 03/21/24  3:05 PM   Specimen: Nasal Mucosa; Nasal Swab  Result Value Ref Range Status   MRSA, PCR NEGATIVE NEGATIVE Final   Staphylococcus aureus NEGATIVE NEGATIVE Final    Comment: (NOTE) The Xpert SA Assay (FDA approved for NASAL specimens in patients 58 years of age and older), is one component of a comprehensive surveillance program. It is not intended to diagnose infection nor to guide or monitor treatment. Performed at [facility], [address], [place], [place] [address]     Radiology Studies:  DG Cervical Spine 2 or 3 views Result Date: 03/23/2024 EXAM: FLUOROSCOPIC IMAGES, 2 or 3 Views TECHNIQUE: Fluoroscopy was provided by the radiology department for procedure. Radiologist was not present during examination. FLUOROSCOPY DOSE AND TYPE: Radiation Dose Index: Reference Air Kerma (in mGy) = 3.35 Fluoroscopy Time: 46 seconds Total Images: 3 COMPARISON: None available. CLINICAL HISTORY: [id number] Surgery, elective [id number]. FINDINGS: Intraoperative fluoroscopic imaging was performed. Known fracture at the base of the odontoid is well aligned on the initial images. Subsequent posterior fusion was performed at  C1-C2. IMPRESSION: 1. Well-aligned known fracture at the base of the odontoid. 2. Status post posterior C1C2 fusion placement. NOTE: Intraoperative fluoroscopic spot images as above. Please refer to the intraoperative report for full details. Electronically signed by: Oneil Devonshire MD 03/23/2024 08:20 PM EST RP Workstation: [id number] BARE C-Arm 1-60 Min-No Report Result Date: 03/23/2024 Fluoroscopy was utilized by the requesting physician.  No radiographic interpretation.   DG C-Arm 1-60 Min-No Report Result Date: 03/23/2024 Fluoroscopy was utilized by the requesting physician.  No radiographic interpretation.   DG C-Arm 1-60 Min-No Report Result Date: 03/23/2024 Fluoroscopy was utilized by the requesting physician.  No radiographic interpretation.    Scheduled Meds:    (feeding supplement) PROSource Plus  30 mL Oral BID BM   acetaminophen   1,000 mg Oral Q8H   amiodarone   200 mg Oral Daily   atorvastatin   20 mg Oral QPM   Chlorhexidine  Gluconate Cloth  6 each Topical Q0600   Chlorhexidine  Gluconate Cloth  6 each Topical Q0600   cilostazol   50 mg Oral BID  cinacalcet   90 mg Oral QPM   doxercalciferol   4 mcg Intravenous Q M,W,F-HD   ferric citrate   420 mg Oral TID WC   methocarbamol  1,000 mg Oral Q8H   multivitamin  1 tablet Oral QHS   sodium chloride  flush  3 mL Intravenous Q12H    Continuous Infusions:     LOS: 9 days     Trenda Mar, MD,  FACP, [facility], [facility], [facility]   [facility] [place]      To contact the attending provider between 7A-7P or the covering provider during after hours 7P-7A, please log into the web site [email or web address] and access using universal [place] password for that web site. If you do not have the password, please call the hospital operator.  03/25/2024, 4:13 PM

## 2024-03-25 NOTE — Plan of Care (Signed)
 Pt wore soft collar whenever out of bed. Pt found it difficult to get comfortable in bed. 1L [place] placed on for comfort while pt was in bed.  Patient wanted to sleep in chair starting early this morning (wore collar). No PRN pain management medication needed during shift. Problem: Coping: Goal: Level of anxiety will decrease Outcome: Progressing   Problem: Skin Integrity: Goal: Risk for impaired skin integrity will decrease Outcome: Progressing   Problem: Pain Management: Goal: Pain level will decrease Outcome: Progressing   Problem: Skin Integrity: Goal: Will show signs of wound healing Outcome: Progressing

## 2024-03-25 NOTE — Progress Notes (Signed)
 [facility] Progress Note  Name: Gloria Gallagher MRN: [id number] DOB: 1950/12/13  Subjective:  Saw in room In good spirits  Vitals:  Vitals:   03/24/24 1400 03/24/24 1757 03/24/24 2115 03/25/24 0607  BP: 118/78 (!) 127/59 (!) 121/59 130/62  Pulse:    65  Resp: 17 18    Temp: 98.1 F (36.7 C) (!) 97.5 F (36.4 C) (!) 97.5 F (36.4 C) 97.9 F (36.6 C)  TempSrc: Oral  Oral Oral  SpO2: 98%   100%  Weight:      Height:         Physical Exam:    General adult female in bed in no acute distress  Neck in soft c-collar Lungs CTA bilat, 2L [place] O2 Heart S1S2 no rub Abdomen soft nontender nondistended Ext: no edema Neuro: alert and Ox3  Access: LUE AVF+b/t    OP HD: [facility] M[facility] 3h  B400   66.4kg    2K bath  AVF  Heparin  none Very good compliance Gets to dry wt, ave wg= 1- 3kg    Assessment/Plan:   # 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  - S/P neck surgery Monday 11/10  - per neurosurgery  # Fall with Left pubic rami fracture - Per orthopedic surgery - PT has seen   # ESRD - HD MWF -  HD today   # Hypertension/ volume - BP's good today  - 11/03 cxr was clear - 3 kg under dry wt - lower dry wt on dc    #Anemia of CKD - No ESA indicated   # Metabolic bone disease - continue Hectorol  and Sensipar  - auryxia  for hyperphosphatemia   Myer Fret  MD  [facility] 03/25/2024, 11:24 AM  Recent Labs  Lab 03/24/24 0859 03/25/24 0431  HGB 12.1 11.3*  CALCIUM  8.8* 8.7*  CREATININE 6.28* 7.25*  K 4.7 4.5    Inpatient medications:  (feeding supplement) PROSource Plus  30 mL Oral BID BM   acetaminophen   1,000 mg Oral Q8H   amiodarone   200 mg Oral Daily   atorvastatin   20 mg Oral QPM   Chlorhexidine  Gluconate Cloth  6 each Topical Q0600   Chlorhexidine  Gluconate Cloth  6 each Topical Q0600   cinacalcet   90 mg Oral QPM   doxercalciferol   4 mcg Intravenous Q M,W,F-HD   ferric citrate   420 mg Oral TID WC   methocarbamol  1,000 mg  Oral Q8H   multivitamin  1 tablet Oral QHS   sodium chloride  flush  3 mL Intravenous Q12H     hydrALAZINE , HYDROmorphone  (DILAUDID ) injection, menthol **OR** phenol, oxyCODONE , prochlorperazine, sodium chloride  flush

## 2024-03-25 NOTE — Progress Notes (Signed)
 Physical Therapy Treatment Patient Details Name: Gloria Gallagher MRN: [id number] DOB: [birth year] Today's Date: 03/25/2024   History of Present Illness The pt is a 73 yo female presenting 11/3 after a fall on blood thinners. CT showed C1-2 subluxation, neurosurgery consulted, and L-sided pubic rami fx. S/p Posterior cervical fusion C1-C2 11/10. PMH includes: anemia, arthritis, afib, CAD, ESRD on HD MWF, and neuropathy.    PT Comments  Pt up in chair on arrival, agreeable to session, however truncated as transport arriving to take pt to HD. Pt requiring grossly CGA for transfers sit<>stand and short in room ambulation with RW for support. Pt needing mod A to manage Les back into bed at end of session. Pt needing cues for safety throughout session and cervical collar wear with mobility. Pt able to verbally recall precautions however requiring cues for adherence with mobility. Pt continues to benefit from skilled PT services to progress toward functional mobility goals.     If plan is discharge home, recommend the following: A little help with walking and/or transfers;Assistance with cooking/housework;Help with stairs or ramp for entrance;Assist for transportation;A little help with bathing/dressing/bathroom   Can travel by private vehicle        Equipment Recommendations  Wheelchair (measurements PT);Wheelchair cushion (measurements PT)    Recommendations for Other Services       Precautions / Restrictions Precautions Precautions: Cervical;Fall Precaution Booklet Issued: No Recall of Precautions/Restrictions: Intact Precaution/Restrictions Comments: Pt recalled cervical precautions. Soft collar: May remove when in bed: Yes; May ambulate to bathroom without brace: Yes; Apply/Remove Brace: While sitting; May remove brace to shower: Yes Required Braces or Orthoses: Cervical Brace Cervical Brace: Soft collar Restrictions Weight Bearing Restrictions Per Provider Order: Yes LLE Weight  Bearing Per Provider Order: Weight bearing as tolerated     Mobility  Bed Mobility Overal bed mobility: Needs Assistance Bed Mobility: Sit to Supine       Sit to supine: Mod assist, Used rails   General bed mobility comments: mod A to manage LEs into bed and reposition    Transfers Overall transfer level: Needs assistance Equipment used: Rolling walker (2 wheels) Transfers: Sit to/from Stand Sit to Stand: Contact guard assist           General transfer comment: CGA to stand from recliner with cues for hand placement    Ambulation/Gait Ambulation/Gait assistance: Contact guard assist Gait Distance (Feet): 6 Feet Assistive device: Rolling walker (2 wheels) Gait Pattern/deviations: Step-through pattern, Decreased step length - right, Decreased step length - left, Decreased stride length, Decreased stance time - left, Decreased weight shift to left, Antalgic, Trunk flexed Gait velocity: decreased     General Gait Details: short shuffling setps from recliner to EOB as transport present to take pt to HD   Stairs             Wheelchair Mobility     Tilt Bed    Modified Rankin (Stroke Patients Only)       Balance Overall balance assessment: Needs assistance Sitting-balance support: Bilateral upper extremity supported, Feet supported Sitting balance-Leahy Scale: Fair Sitting balance - Comments: Pt able to maintain seated position in recliner without posterior support for limited duration.   Standing balance support: Bilateral upper extremity supported, Reliant on assistive device for balance Standing balance-Leahy Scale: Poor Standing balance comment: Pt dependent on RW after short distance gait  Communication Communication Communication: No apparent difficulties  Cognition Arousal: Alert Behavior During Therapy: WFL for tasks assessed/performed   PT - Cognitive impairments: Sequencing, Safety/Judgement, No apparent  impairments                       PT - Cognition Comments: Pt required moderate cues for improved technique and safety awareness during mobility. Following commands: Intact      Cueing Cueing Techniques: Verbal cues  Exercises      General Comments General comments (skin integrity, edema, etc.): transport arriving to take pt to HD      Pertinent Vitals/Pain Pain Assessment Pain Assessment: Faces Faces Pain Scale: Hurts little more Pain Location: surgical site Pain Descriptors / Indicators: Grimacing, Sore Pain Intervention(s): Monitored during session, Limited activity within patient's tolerance    Home Living                          Prior Function            PT Goals (current goals can now be found in the care plan section) Acute Rehab PT Goals Patient Stated Goal: Have less pain and get rid of this brace. PT Goal Formulation: With patient Time For Goal Achievement: 03/31/24 Progress towards PT goals: Progressing toward goals    Frequency    Min 2X/week      PT Plan      Co-evaluation              AM-PAC PT 6 Clicks Mobility   Outcome Measure  Help needed turning from your back to your side while in a flat bed without using bedrails?: A Little Help needed moving from lying on your back to sitting on the side of a flat bed without using bedrails?: A Little Help needed moving to and from a bed to a chair (including a wheelchair)?: A Little Help needed standing up from a chair using your arms (e.g., wheelchair or bedside chair)?: A Little Help needed to walk in hospital room?: A Little Help needed climbing 3-5 steps with a railing? : A Lot 6 Click Score: 17    End of Session Equipment Utilized During Treatment: Cervical collar Activity Tolerance: Patient tolerated treatment well;Patient limited by pain;Patient limited by fatigue Patient left: in bed;Other (comment) (with transport) Nurse Communication: Mobility status PT Visit  Diagnosis: Unsteadiness on feet (R26.81);Other abnormalities of gait and mobility (R26.89);Muscle weakness (generalized) (M62.81);History of falling (Z91.81);Pain Pain - Right/Left: Left Pain - part of body: Hip     Time: 1359-1410 PT Time Calculation (min) (ACUTE ONLY): 11 min  Charges:    $Therapeutic Activity: 8-22 mins PT General Charges $$ ACUTE PT VISIT: 1 Visit                     Heba Ige R. PTA Acute Rehabilitation Services Office: [phone]   Therisa CHRISTELLA Boor 03/25/2024, 4:33 PM

## 2024-03-25 NOTE — Progress Notes (Signed)
   Providing Compassionate, Quality Care - Together   Subjective: Patient reports neck pain improved. Evaluated in HD.  Objective: Vital signs in last 24 hours: Temp:  [97.5 F (36.4 C)-98.1 F (36.7 C)] 97.9 F (36.6 C) (11/12 0607) Pulse Rate:  [65] 65 (11/12 0607) Resp:  [17-18] 18 (11/11 1757) BP: (118-130)/(59-78) 130/62 (11/12 0607) SpO2:  [98 %-100 %] 100 % (11/12 0607)  Intake/Output from previous day: 11/11 0701 - 11/12 0700 In: 968.1 [P.O.:960; I.V.:8.1] Out: -  Intake/Output this shift: No intake/output data recorded.  Alert and oriented x 4 PERRLA CN II-XII grossly intact MAE, Strength and sensation are stable Incision is covered with Honeycomb dressing and Steri Strips; Dressing is dry and intact, with small amount of old sanguinous drainage   Lab Results: Recent Labs    03/24/24 0859 03/25/24 0431  WBC 6.1 6.4  HGB 12.1 11.3*  HCT 37.5 35.3*  PLT 186 166   BMET Recent Labs    03/24/24 0859 03/25/24 0431  NA 130* 130*  K 4.7 4.5  CL 89* 90*  CO2 23 22  GLUCOSE 119* 103*  BUN 24* 31*  CREATININE 6.28* 7.25*  CALCIUM  8.8* 8.7*    Studies/Results: DG Cervical Spine 2 or 3 views Result Date: 03/23/2024 EXAM: FLUOROSCOPIC IMAGES, 2 or 3 Views TECHNIQUE: Fluoroscopy was provided by the radiology department for procedure. Radiologist was not present during examination. FLUOROSCOPY DOSE AND TYPE: Radiation Dose Index: Reference Air Kerma (in mGy) = 3.35 Fluoroscopy Time: 46 seconds Total Images: 3 COMPARISON: None available. CLINICAL HISTORY: [id number] Surgery, elective [id number]. FINDINGS: Intraoperative fluoroscopic imaging was performed. Known fracture at the base of the odontoid is well aligned on the initial images. Subsequent posterior fusion was performed at C1-C2. IMPRESSION: 1. Well-aligned known fracture at the base of the odontoid. 2. Status post posterior C1C2 fusion placement. NOTE: Intraoperative fluoroscopic spot images as above. Please refer  to the intraoperative report for full details. Electronically signed by: Oneil Devonshire MD 03/23/2024 08:20 PM EST RP Workstation: [email or web address] [id number] C-Arm 1-60 Min-No Report Result Date: 03/23/2024 Fluoroscopy was utilized by the requesting physician.  No radiographic interpretation.   DG C-Arm 1-60 Min-No Report Result Date: 03/23/2024 Fluoroscopy was utilized by the requesting physician.  No radiographic interpretation.   DG C-Arm 1-60 Min-No Report Result Date: 03/23/2024 Fluoroscopy was utilized by the requesting physician.  No radiographic interpretation.    Assessment/Plan: Patient underwemt C1-2 posterior fusion by Dr. Louis on 03/23/2024. She is slowly improving since surgery.   LOS: 9 days   -Continue efforts at mobilization and pain control. -She is stable from our perspective for discharge. -Can restart Eliquis  at one week post op.  I am in communication with my attending and they agree with the plan for this patient.   Gerard Beck, DNP, AGNP-C Nurse Practitioner  [facility] [address], [address], [place], [place] [address] P: [phone]    F: [phone]  03/25/2024, 12:30 PM

## 2024-03-25 NOTE — Progress Notes (Signed)
 2 liters ultrafiltration, condition stable, and report was given to the primary RN.

## 2024-03-25 NOTE — Anesthesia Postprocedure Evaluation (Signed)
 Anesthesia Post Note  Patient: Gloria Gallagher  Procedure(s) Performed: POSTERIOR CERVICAL FUSION/FORAMINOTOMY CERVICAL ONE-TWO     Patient location during evaluation: PACU Anesthesia Type: General Level of consciousness: awake and alert Pain management: pain level controlled Vital Signs Assessment: post-procedure vital signs reviewed and stable Respiratory status: spontaneous breathing, nonlabored ventilation, respiratory function stable and patient connected to nasal cannula oxygen  Cardiovascular status: blood pressure returned to baseline and stable Postop Assessment: no apparent nausea or vomiting Anesthetic complications: no   No notable events documented.  Last Vitals:  Vitals:   03/24/24 2115 03/25/24 0607  BP: (!) 121/59 130/62  Pulse:  65  Resp:    Temp: (!) 36.4 C 36.6 C  SpO2:  100%    Last Pain:  Vitals:   03/25/24 0622  TempSrc:   PainSc: 0-No pain                 Robyn Nohr S

## 2024-03-26 DIAGNOSIS — I480 Paroxysmal atrial fibrillation: Secondary | ICD-10-CM | POA: Diagnosis not present

## 2024-03-26 DIAGNOSIS — S32509D Unspecified fracture of unspecified pubis, subsequent encounter for fracture with routine healing: Secondary | ICD-10-CM | POA: Diagnosis not present

## 2024-03-26 LAB — CBC
HCT: 33.6 % — ABNORMAL LOW (ref 36.0–46.0)
Hemoglobin: 11 g/dL — ABNORMAL LOW (ref 12.0–15.0)
MCH: 30.3 pg (ref 26.0–34.0)
MCHC: 32.7 g/dL (ref 30.0–36.0)
MCV: 92.6 fL (ref 80.0–100.0)
Platelets: 160 K/uL (ref 150–400)
RBC: 3.63 MIL/uL — ABNORMAL LOW (ref 3.87–5.11)
RDW: 16.2 % — ABNORMAL HIGH (ref 11.5–15.5)
WBC: 6.8 K/uL (ref 4.0–10.5)
nRBC: 0 % (ref 0.0–0.2)

## 2024-03-26 MED ORDER — CHLORHEXIDINE GLUCONATE CLOTH 2 % EX PADS
6.0000 | MEDICATED_PAD | Freq: Every day | CUTANEOUS | Status: DC
  Administered 2024-03-27 – 2024-03-28 (×2): 6 via TOPICAL

## 2024-03-26 NOTE — Plan of Care (Signed)

## 2024-03-26 NOTE — Progress Notes (Signed)
   Providing Compassionate, Quality Care - Together   Subjective: Patient is up to chair eating breakfast. She reports no new issues. She tells me she would like to go home Saturday.  Objective: Vital signs in last 24 hours: Temp:  [97.5 F (36.4 C)-100.9 F (38.3 C)] 97.9 F (36.6 C) (11/13 0701) Pulse Rate:  [55-98] 85 (11/13 0701) Resp:  [15-20] 18 (11/13 0701) BP: (114-152)/(55-105) 119/59 (11/13 0701) SpO2:  [80 %-100 %] 100 % (11/13 0701) Weight:  [63.9 kg] 63.9 kg (11/12 1835)  Intake/Output from previous day: 11/12 0701 - 11/13 0700 In: 240 [P.O.:240] Out: 2  Intake/Output this shift: Total I/O In: 3 [I.V.:3] Out: -   Alert and oriented x 4 PERRLA CN II-XII grossly intact MAE, Strength and sensation are stable Incision is covered with Honeycomb dressing and Steri Strips; Dressing is dry with the edges peeling up; dressing with small amount of old sanguinous drainage  Lab Results: Recent Labs    03/25/24 0431 03/26/24 0554  WBC 6.4 6.8  HGB 11.3* 11.0*  HCT 35.3* 33.6*  PLT 166 160   BMET Recent Labs    03/24/24 0859 03/25/24 0431  NA 130* 130*  K 4.7 4.5  CL 89* 90*  CO2 23 22  GLUCOSE 119* 103*  BUN 24* 31*  CREATININE 6.28* 7.25*  CALCIUM  8.8* 8.7*    Studies/Results: No results found.  Assessment/Plan: Patient underwemt C1-2 posterior fusion by Dr. Louis on 03/23/2024. She is slowly improving since surgery.   LOS: 10 days   -Discharge per primary team. She is fine for discharge from Neurosurgical perspective. -Dressing removed. Leave Steri Strips in place. Can apply new dressing if needed.  I am in communication with my attending and they agree with the plan for this patient.   Gerard Beck, DNP, AGNP-C Nurse Practitioner  [facility] [address], [address], [place], [place] [address] P: [phone]    F: [phone]  03/26/2024, 10:45 AM

## 2024-03-26 NOTE — Progress Notes (Signed)
 [facility] Progress Note  Name: Gloria Gallagher MRN: [id number] DOB: 1950-09-08  Subjective:  Seen in room Wants to do HD early tomorrow, and in a chair  Vitals:  Vitals:   03/25/24 1835 03/25/24 2102 03/26/24 0701 03/26/24 1051  BP: 132/61 114/67 (!) 119/59 (!) 135/51  Pulse: 97 (!) 55 85 80  Resp: 15 18 18 14   Temp: (!) 97.5 F (36.4 C) 97.7 F (36.5 C) 97.9 F (36.6 C)   TempSrc:  Axillary Oral   SpO2: (!) 80% 98% 100% 97%  Weight: 63.9 kg     Height:         Physical Exam:    General adult female in bed in no acute distress  Neck in soft c-collar Lungs CTA bilat, 2L [place] O2 Heart S1S2 no rub Abdomen soft nontender nondistended Ext: no edema Neuro: alert and Ox3  Access: LUE AVF+b/t    OP HD: [facility] M[facility] 3h  B400   66.4kg    2K bath  AVF  Heparin  none Very good compliance Gets to dry wt, ave wg= 1- 3kg    Assessment/Plan:   # 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  - S/P neck surgery Monday 11/10  - per neurosurgery  # Fall with Left pubic rami fracture - Per orthopedic surgery - PT has seen   # ESRD - HD MWF -  HD Friday  # HTN/ volume - BP's good  - 11/03 cxr was clear - 2-3 kg under dry wt, 1 L UF next HD - lower dry wt on dc    #Anemia of CKD - No ESA indicated   # Metabolic bone disease - continue Hectorol  and Sensipar  - auryxia  for hyperphosphatemia   Myer Fret  MD  [facility] 03/26/2024, 2:29 PM  Recent Labs  Lab 03/24/24 0859 03/25/24 0431 03/26/24 0554  HGB 12.1 11.3* 11.0*  CALCIUM  8.8* 8.7*  --   CREATININE 6.28* 7.25*  --   K 4.7 4.5  --     Inpatient medications:  (feeding supplement) PROSource Plus  30 mL Oral BID BM   acetaminophen   1,000 mg Oral Q8H   amiodarone   200 mg Oral Daily   atorvastatin   20 mg Oral QPM   Chlorhexidine  Gluconate Cloth  6 each Topical Q0600   Chlorhexidine  Gluconate Cloth  6 each Topical Q0600   cilostazol   50 mg Oral BID   cinacalcet   90 mg Oral QPM    doxercalciferol   4 mcg Intravenous Q M,W,F-HD   ferric citrate   420 mg Oral TID WC   methocarbamol  1,000 mg Oral Q8H   multivitamin  1 tablet Oral QHS   sodium chloride  flush  3 mL Intravenous Q12H     hydrALAZINE , HYDROmorphone  (DILAUDID ) injection, menthol **OR** phenol, oxyCODONE , prochlorperazine, sodium chloride  flush

## 2024-03-26 NOTE — Plan of Care (Signed)
 Problem: Education: Goal: Knowledge of General Education information will improve Description: Including pain rating scale, medication(s)/side effects and non-pharmacologic comfort measures 03/26/2024 2012 by Libi Corso K, RN Outcome: Progressing 03/26/2024 2012 by Ronasia Isola K, RN Outcome: Progressing   Problem: Health Behavior/Discharge Planning: Goal: Ability to manage health-related needs will improve 03/26/2024 2012 by Tahesha Skeet K, RN Outcome: Progressing 03/26/2024 2012 by Janmarie Smoot K, RN Outcome: Progressing   Problem: Clinical Measurements: Goal: Ability to maintain clinical measurements within normal limits will improve 03/26/2024 2012 by Jacoby Ritsema K, RN Outcome: Progressing 03/26/2024 2012 by Kuron Docken K, RN Outcome: Progressing Goal: Will remain free from infection 03/26/2024 2012 by Kristopher Delk K, RN Outcome: Progressing 03/26/2024 2012 by Glenys Snader K, RN Outcome: Progressing Goal: Diagnostic test results will improve 03/26/2024 2012 by Ned Kakar K, RN Outcome: Progressing 03/26/2024 2012 by Queenie Aufiero K, RN Outcome: Progressing Goal: Respiratory complications will improve 03/26/2024 2012 by Michille Mcelrath K, RN Outcome: Progressing 03/26/2024 2012 by Hana Trippett K, RN Outcome: Progressing Goal: Cardiovascular complication will be avoided 03/26/2024 2012 by Pratt Bress K, RN Outcome: Progressing 03/26/2024 2012 by Venesha Petraitis K, RN Outcome: Progressing   Problem: Activity: Goal: Risk for activity intolerance will decrease 03/26/2024 2012 by Florian Dellis POUR, RN Outcome: Progressing 03/26/2024 2012 by Florian Dellis POUR, RN Outcome: Progressing   Problem: Nutrition: Goal: Adequate nutrition will be maintained 03/26/2024 2012 by Florian Dellis POUR, RN Outcome: Progressing 03/26/2024 2012 by Florian Dellis POUR, RN Outcome: Progressing   Problem: Coping: Goal: Level of anxiety will decrease 03/26/2024  2012 by Florian Dellis POUR, RN Outcome: Progressing 03/26/2024 2012 by Florian Dellis POUR, RN Outcome: Progressing   Problem: Elimination: Goal: Will not experience complications related to bowel motility 03/26/2024 2012 by Florian Dellis POUR, RN Outcome: Progressing 03/26/2024 2012 by Florian Dellis POUR, RN Outcome: Progressing Goal: Will not experience complications related to urinary retention 03/26/2024 2012 by Florian Dellis POUR, RN Outcome: Progressing 03/26/2024 2012 by Florian Dellis POUR, RN Outcome: Progressing   Problem: Pain Managment: Goal: General experience of comfort will improve and/or be controlled 03/26/2024 2012 by Emonii Wienke K, RN Outcome: Progressing 03/26/2024 2012 by Florian Dellis POUR, RN Outcome: Progressing   Problem: Safety: Goal: Ability to remain free from injury will improve 03/26/2024 2012 by Florian Dellis POUR, RN Outcome: Progressing 03/26/2024 2012 by Florian Dellis POUR, RN Outcome: Progressing   Problem: Skin Integrity: Goal: Risk for impaired skin integrity will decrease 03/26/2024 2012 by Florian Dellis POUR, RN Outcome: Progressing 03/26/2024 2012 by Fynlee Rowlands K, RN Outcome: Progressing   Problem: Education: Goal: Ability to verbalize activity precautions or restrictions will improve 03/26/2024 2012 by Dequita Schleicher K, RN Outcome: Progressing 03/26/2024 2012 by Bobby Ragan K, RN Outcome: Progressing Goal: Knowledge of the prescribed therapeutic regimen will improve 03/26/2024 2012 by Lyndee Herbst K, RN Outcome: Progressing 03/26/2024 2012 by Leeam Cedrone K, RN Outcome: Progressing Goal: Understanding of discharge needs will improve 03/26/2024 2012 by Laquan Beier K, RN Outcome: Progressing 03/26/2024 2012 by Natausha Jungwirth K, RN Outcome: Progressing   Problem: Activity: Goal: Ability to avoid complications of mobility impairment will improve 03/26/2024 2012 by Denarius Sesler K, RN Outcome: Progressing 03/26/2024 2012 by  Hollis Tuller K, RN Outcome: Progressing Goal: Ability to tolerate increased activity will improve 03/26/2024 2012 by Katrinna Travieso K, RN Outcome: Progressing 03/26/2024 2012 by Micky Overturf K, RN Outcome: Progressing Goal: Will remain free from falls 03/26/2024 2012 by Alta Goding K, RN Outcome: Progressing 03/26/2024 2012 by  Alcario Tinkey K, RN Outcome: Progressing   Problem: Bowel/Gastric: Goal: Gastrointestinal status for postoperative course will improve 03/26/2024 2012 by Alenah Sarria K, RN Outcome: Progressing 03/26/2024 2012 by Jaeliana Lococo K, RN Outcome: Progressing   Problem: Clinical Measurements: Goal: Ability to maintain clinical measurements within normal limits will improve 03/26/2024 2012 by Florian Dellis POUR, RN Outcome: Progressing 03/26/2024 2012 by Nyomi Howser K, RN Outcome: Progressing Goal: Postoperative complications will be avoided or minimized 03/26/2024 2012 by Shulamis Wenberg K, RN Outcome: Progressing 03/26/2024 2012 by Myishia Kasik K, RN Outcome: Progressing Goal: Diagnostic test results will improve 03/26/2024 2012 by Tracey Stewart K, RN Outcome: Progressing 03/26/2024 2012 by Chasitty Hehl K, RN Outcome: Progressing   Problem: Pain Management: Goal: Pain level will decrease 03/26/2024 2012 by Florian Dellis POUR, RN Outcome: Progressing 03/26/2024 2012 by Marion Rosenberry K, RN Outcome: Progressing   Problem: Skin Integrity: Goal: Will show signs of wound healing 03/26/2024 2012 by Milik Gilreath K, RN Outcome: Progressing 03/26/2024 2012 by Andersen Iorio K, RN Outcome: Progressing   Problem: Health Behavior/Discharge Planning: Goal: Identification of resources available to assist in meeting health care needs will improve 03/26/2024 2012 by Adamariz Gillott K, RN Outcome: Progressing 03/26/2024 2012 by Maribeth Jiles K, RN Outcome: Progressing   Problem: Bladder/Genitourinary: Goal: Urinary functional status for  postoperative course will improve 03/26/2024 2012 by Colin Norment K, RN Outcome: Progressing 03/26/2024 2012 by Dexter Signor K, RN Outcome: Progressing

## 2024-03-26 NOTE — Progress Notes (Signed)
 PROGRESS NOTE   Gloria Gallagher  FMW:[id number]    DOB: 05-19-1950    DOA: 03/16/2024  PCP: Campbell Reynolds, NP   I have briefly reviewed patients previous medical records in [facility].   Brief Hospital Course:   73 y.o. female with medical history significant for ESRD on HD MWF, PVD, PAF on Eliquis , chronic HFpEF, who presents to the ER after a mechanical fall at home. The patient was attempting to hit a fly, lost her balance and fell, LOC. Level 2 trauma was activated due to the patient being on blood thinners. Report pelvic pain. Left-sided pubic rami fracture and C1-C2 subluxation revealed on CT scan.  Seen by trauma team and orthopedic surgery.  Recommended nonoperative management.  Weightbearing as tolerated.  Return to clinic in 4 weeks for x-ray following discharge.  Pain control.  Underwent C-spine surgery by neurosurgery on 11/10.    From a medical standpoint, patient is optimized for DC to home with home health.  However, patient reports that she stays with her sister and her sister is out of town and the earliest she can DC home safely will be on 11/15.   Assessment & Plan:   S/p mechanical fall Left inferior and superior pubic rami fracture Trauma and orthopedics consulted.  Nonoperative management.  WBAT LLE. Return to clinic in 4 weeks for x-rays.  Follow-up with Dr. [name]. Therapies evaluated and recommend home health PT and OT.  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 S/p posterior cervical fusion C1-C2 11/10. Management per neurosurgery.  Has C-collar, continue.  Per Dr. Louis note from 11/11, can work towards discharge from his standpoint.  Paroxysmal A-fib Rate controlled on amiodarone . Eliquis  was on hold for surgery.  As per neurosurgery follow-up, recommend restarting Eliquis  at 1 week postop.  Recommend restarting 11/18. Long-term anticoagulation decision deferred to cardiology during outpatient follow-up. TTE  this admission showed LVEF of 60 to 65%  ESRD on MWF HD Hyponatremia Nephrology following for HD needs.  S/p HD 11/12 with 2 L ultrafiltration.  PAD Continue atorvastatin .  Resumed Pletal .  Patient not taking ASA or Plavix  PTA.  Body mass index is 23.44 kg/m.   DVT prophylaxis: SCD's Start: 03/23/24 2117 SCDs Start: 03/16/24 2326     Code Status: Full Code:  Family Communication: None at bedside Disposition:    See above note for barrier to DC.     Consultants:   Trauma Orthopedics Nephrology Neurosurgery  Procedures:   As noted above  Subjective:  Denies complaints.  Some neck pain.  No tingling or numbness of extremities.  No dyspnea.  Per nursing, patient not compliant with c-collar, counseled patient regarding importance of same.  Objective:   Vitals:   03/25/24 1835 03/25/24 2102 03/26/24 0701 03/26/24 1051  BP: 132/61 114/67 (!) 119/59 (!) 135/51  Pulse: 97 (!) 55 85 80  Resp: 15 18 18 14   Temp: (!) 97.5 F (36.4 C) 97.7 F (36.5 C) 97.9 F (36.6 C)   TempSrc:  Axillary Oral   SpO2: (!) 80% 98% 100% 97%  Weight: 63.9 kg     Height:        General exam: Elderly female, moderately built and nourished sitting up comfortably in bed without distress.  We did not have c-collar on. Respiratory system: Clear to auscultation. Respiratory effort normal. Cardiovascular system: S1 & S2 heard, irregularly irregular. No JVD, murmurs, rubs, gallops or clicks. No pedal edema.  Telemetry personally reviewed: A-fib with controlled ventricular  rate.  Discontinued telemetry. Gastrointestinal system: Abdomen is nondistended, soft and nontender. No organomegaly or masses felt. Normal bowel sounds heard. Central nervous system: Alert and oriented. No focal neurological deficits. Extremities: Symmetric 5 x 5 power. Skin: No rashes, lesions or ulcers Psychiatry: Judgement and insight appear normal. Mood & affect appropriate.     Data Reviewed:   I have personally  reviewed following labs and imaging studies   CBC: Recent Labs  Lab 03/23/24 0539 03/23/24 1551 03/24/24 0859 03/25/24 0431 03/26/24 0554  WBC 3.4*  --  6.1 6.4 6.8  NEUTROABS 2.1  --  5.3 4.7  --   HGB 11.1*   < > 12.1 11.3* 11.0*  HCT 33.9*   < > 37.5 35.3* 33.6*  MCV 92.6  --  94.2 94.1 92.6  PLT 149*  --  186 166 160   < > = values in this interval not displayed.    Basic Metabolic Panel: Recent Labs  Lab 03/21/24 0541 03/22/24 0503 03/23/24 0539 03/23/24 1551 03/24/24 0859 03/25/24 0431  NA 133* 129* 130* 131* 130* 130*  K 3.6 4.3 4.0 4.3 4.7 4.5  CL 90* 89* 89* 90* 89* 90*  CO2 27 23 24   --  23 22  GLUCOSE 83 88 95 91 119* 103*  BUN 21 28* 34* 25* 24* 31*  CREATININE 5.12* 6.67* 8.05* 5.30* 6.28* 7.25*  CALCIUM  8.7* 8.3* 8.2*  --  8.8* 8.7*    Liver Function Tests: No results for input(s): AST, ALT, ALKPHOS, BILITOT, PROT, ALBUMIN  in the last 168 hours.  CBG: No results for input(s): GLUCAP in the last 168 hours.  Microbiology Studies:   Recent Results (from the past 240 hours)  Surgical pcr screen     Status: None   Collection Time: 03/21/24  3:05 PM   Specimen: Nasal Mucosa; Nasal Swab  Result Value Ref Range Status   MRSA, PCR NEGATIVE NEGATIVE Final   Staphylococcus aureus NEGATIVE NEGATIVE Final    Comment: (NOTE) The Xpert SA Assay (FDA approved for NASAL specimens in patients 110 years of age and older), is one component of a comprehensive surveillance program. It is not intended to diagnose infection nor to guide or monitor treatment. Performed at [facility], [address], [place], [place] [address]     Radiology Studies:  No results found.   Scheduled Meds:    (feeding supplement) PROSource Plus  30 mL Oral BID BM   acetaminophen   1,000 mg Oral Q8H   amiodarone   200 mg Oral Daily   atorvastatin   20 mg Oral QPM   Chlorhexidine  Gluconate Cloth  6 each Topical Q0600   Chlorhexidine  Gluconate Cloth  6 each  Topical Q0600   cilostazol   50 mg Oral BID   cinacalcet   90 mg Oral QPM   doxercalciferol   4 mcg Intravenous Q M,W,F-HD   ferric citrate   420 mg Oral TID WC   methocarbamol  1,000 mg Oral Q8H   multivitamin  1 tablet Oral QHS   sodium chloride  flush  3 mL Intravenous Q12H    Continuous Infusions:     LOS: 10 days     Trenda Mar, MD,  FACP, [facility], [facility], [facility]   [facility] [place]      To contact the attending provider between 7A-7P or the covering provider during after hours 7P-7A, please log into the web site [email or web address] and access using universal [place] password for that web site. If you do  not have the password, please call the hospital operator.  03/26/2024, 11:24 AM

## 2024-03-26 NOTE — Plan of Care (Signed)
  Problem: Activity: Goal: Risk for activity intolerance will decrease Outcome: Progressing   Problem: Pain Managment: Goal: General experience of comfort will improve and/or be controlled Outcome: Progressing   Problem: Safety: Goal: Ability to remain free from injury will improve Outcome: Progressing   Problem: Skin Integrity: Goal: Risk for impaired skin integrity will decrease Outcome: Progressing

## 2024-03-27 DIAGNOSIS — S32509D Unspecified fracture of unspecified pubis, subsequent encounter for fracture with routine healing: Secondary | ICD-10-CM | POA: Diagnosis not present

## 2024-03-27 DIAGNOSIS — I480 Paroxysmal atrial fibrillation: Secondary | ICD-10-CM | POA: Diagnosis not present

## 2024-03-27 MED ORDER — ALTEPLASE 2 MG IJ SOLR: 2.0000 mg | Freq: Once | INTRAMUSCULAR | Status: DC | PRN

## 2024-03-27 MED ORDER — LIDOCAINE HCL (PF) 1 % IJ SOLN: 5.0000 mL | INTRAMUSCULAR | Status: DC | PRN

## 2024-03-27 MED ORDER — NEPRO/CARBSTEADY PO LIQD: 237.0000 mL | ORAL | Status: DC | PRN

## 2024-03-27 MED ORDER — HEPARIN SODIUM (PORCINE) 1000 UNIT/ML DIALYSIS
1000.0000 [IU] | INTRAMUSCULAR | Status: DC | PRN
Start: 2024-03-27 — End: 2024-03-27

## 2024-03-27 MED ORDER — ANTICOAGULANT SODIUM CITRATE 4% (200MG/5ML) IV SOLN: 5.0000 mL | Status: DC | PRN

## 2024-03-27 MED ORDER — DOXERCALCIFEROL 4 MCG/2ML IV SOLN
INTRAVENOUS | Status: AC
  Filled 2024-03-27: qty 2

## 2024-03-27 MED ORDER — PENTAFLUOROPROP-TETRAFLUOROETH EX AERO
1.0000 | INHALATION_SPRAY | CUTANEOUS | Status: DC | PRN
Start: 2024-03-27 — End: 2024-03-27

## 2024-03-27 MED ORDER — LIDOCAINE-PRILOCAINE 2.5-2.5 % EX CREA
1.0000 | TOPICAL_CREAM | CUTANEOUS | Status: DC | PRN
Start: 2024-03-27 — End: 2024-03-27

## 2024-03-27 NOTE — Progress Notes (Signed)
 PROGRESS NOTE   Gloria Gallagher  FMW:[id number]    DOB: May 31, 1950    DOA: 03/16/2024  PCP: Campbell Reynolds, NP   I have briefly reviewed patients previous medical records in [facility].   Brief Hospital Course:   73 y.o. female with medical history significant for ESRD on HD MWF, PVD, PAF on Eliquis , chronic HFpEF, who presents to the ER after a mechanical fall at home. The patient was attempting to hit a fly, lost her balance and fell, LOC. Level 2 trauma was activated due to the patient being on blood thinners. Report pelvic pain. Left-sided pubic rami fracture and C1-C2 subluxation revealed on CT scan.  Seen by trauma team and orthopedic surgery.  Recommended nonoperative management.  Weightbearing as tolerated.  Return to clinic in 4 weeks for x-ray following discharge.  Pain control.  Underwent C-spine surgery by neurosurgery on 11/10.    From a medical standpoint, patient is optimized for DC to home with home health.  However, patient reports that she stays with her sister and her sister is out of town and the earliest she can DC home safely will be on 11/15.   Assessment & Plan:   S/p mechanical fall Left inferior and superior pubic rami fracture Trauma and orthopedics consulted.  Nonoperative management.  WBAT LLE. Return to clinic in 4 weeks for x-rays.  Follow-up with Dr. [name]. Therapies evaluated and recommend home health PT and OT.  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 S/p posterior cervical fusion C1-C2 11/10. Management per neurosurgery.  Has C-collar, continue. Neurosurgery continue to follow, appreciate their input.  Cleared for DC from their standpoint with follow-up in the office in 1 week.  Paroxysmal A-fib Rate controlled on amiodarone . Eliquis  was on hold for surgery.  As per neurosurgery follow-up, recommend restarting Eliquis  at 1 week postop.  Recommend restarting 11/18. Long-term anticoagulation decision  deferred to cardiology during outpatient follow-up. TTE this admission showed LVEF of 60 to 65%  ESRD on MWF HD Hyponatremia Nephrology following for HD needs.  S/p HD 11/12 with 2 L ultrafiltration.  Seen on HD 11/14.  PAD Continue atorvastatin .  Resumed Pletal .  Patient not taking ASA or Plavix  PTA.  Body mass index is 24.36 kg/m.   DVT prophylaxis: SCD's Start: 03/23/24 2117 SCDs Start: 03/16/24 2326     Code Status: Full Code:  Family Communication: None at bedside Disposition:    See above note for barrier to DC.     Consultants:   Trauma Orthopedics Nephrology Neurosurgery  Procedures:   As noted above  Subjective:  Seen this morning at dialysis.  She was sitting up in chair undergoing dialysis.  She reported that she felt great.  She advised that she has been in contact with her sister who plans to be there tomorrow for her to discharge home.  She declined MDs offered to speak with her sister.  Mild neck pain reported.  Objective:   Vitals:   03/27/24 1030 03/27/24 1045 03/27/24 1059 03/27/24 1136  BP: 118/68 125/66 119/61 (!) 123/58  Pulse: 84 94 92 80  Resp: 14 (!) 22 (!) 23   Temp:  97.6 F (36.4 C)  97.6 F (36.4 C)  TempSrc:  Oral  Oral  SpO2: 97% 96% 97% 98%  Weight:  66.4 kg    Height:        General exam: Elderly female, moderately built and nourished sitting up in reclining chair undergoing HD this morning. Respiratory system:  Clear to auscultation. Respiratory effort normal. Cardiovascular system: S1 & S2 heard, irregularly irregular. No JVD, murmurs, rubs, gallops or clicks. No pedal edema.  Stable. Gastrointestinal system: Abdomen is nondistended, soft and nontender. No organomegaly or masses felt. Normal bowel sounds heard. Central nervous system: Alert and oriented. No focal neurological deficits. Extremities: Symmetric 5 x 5 power. Skin: No rashes, lesions or ulcers Psychiatry: Judgement and insight appear normal. Mood & affect  appropriate.     Data Reviewed:   I have personally reviewed following labs and imaging studies   CBC: Recent Labs  Lab 03/23/24 0539 03/23/24 1551 03/24/24 0859 03/25/24 0431 03/26/24 0554  WBC 3.4*  --  6.1 6.4 6.8  NEUTROABS 2.1  --  5.3 4.7  --   HGB 11.1*   < > 12.1 11.3* 11.0*  HCT 33.9*   < > 37.5 35.3* 33.6*  MCV 92.6  --  94.2 94.1 92.6  PLT 149*  --  186 166 160   < > = values in this interval not displayed.    Basic Metabolic Panel: Recent Labs  Lab 03/21/24 0541 03/22/24 0503 03/23/24 0539 03/23/24 1551 03/24/24 0859 03/25/24 0431  NA 133* 129* 130* 131* 130* 130*  K 3.6 4.3 4.0 4.3 4.7 4.5  CL 90* 89* 89* 90* 89* 90*  CO2 27 23 24   --  23 22  GLUCOSE 83 88 95 91 119* 103*  BUN 21 28* 34* 25* 24* 31*  CREATININE 5.12* 6.67* 8.05* 5.30* 6.28* 7.25*  CALCIUM  8.7* 8.3* 8.2*  --  8.8* 8.7*    Liver Function Tests: No results for input(s): AST, ALT, ALKPHOS, BILITOT, PROT, ALBUMIN  in the last 168 hours.  CBG: No results for input(s): GLUCAP in the last 168 hours.  Microbiology Studies:   Recent Results (from the past 240 hours)  Surgical pcr screen     Status: None   Collection Time: 03/21/24  3:05 PM   Specimen: Nasal Mucosa; Nasal Swab  Result Value Ref Range Status   MRSA, PCR NEGATIVE NEGATIVE Final   Staphylococcus aureus NEGATIVE NEGATIVE Final    Comment: (NOTE) The Xpert SA Assay (FDA approved for NASAL specimens in patients 66 years of age and older), is one component of a comprehensive surveillance program. It is not intended to diagnose infection nor to guide or monitor treatment. Performed at [facility], [address] [place], [place] [address]     Radiology Studies:  No results found.   Scheduled Meds:    (feeding supplement) PROSource Plus  30 mL Oral BID BM   acetaminophen   1,000 mg Oral Q8H   amiodarone   200 mg Oral Daily   atorvastatin   20 mg Oral QPM   Chlorhexidine  Gluconate Cloth  6 each  Topical Q0600   cilostazol   50 mg Oral BID   cinacalcet   90 mg Oral QPM   doxercalciferol   4 mcg Intravenous Q M,W,F-HD   ferric citrate   420 mg Oral TID WC   methocarbamol  1,000 mg Oral Q8H   multivitamin  1 tablet Oral QHS   sodium chloride  flush  3 mL Intravenous Q12H    Continuous Infusions:     LOS: 11 days     Trenda Mar, MD,  FACP, [facility], [facility], [facility]   [facility] [place]      To contact the attending provider between 7A-7P or the covering provider during after hours 7P-7A, please log into the web site [email or web address] and access  using universal [place] password for that web site. If you do not have the password, please call the hospital operator.  03/27/2024, 12:56 PM

## 2024-03-27 NOTE — Progress Notes (Signed)
 OT Cancellation Note  Patient Details Name: Gloria Gallagher MRN: [id number] DOB: 1950/06/25   Cancelled Treatment:    Reason Eval/Treat Not Completed: Patient at procedure or test/ unavailable (currently in HD)  Kennth Mliss Helling 03/27/2024, 8:03 AM Mliss HERO, OTR/L Acute Rehabilitation Services Office: [phone]

## 2024-03-27 NOTE — Progress Notes (Addendum)
 Received patient in bed to unit.  Alert and oriented.  Informed consent signed and in chart.   TX duration: 139 minutes.  Patient signed AMA paperwork to be able to go to bathroom on floor Did dialysis in chair, tolerated well. Patient tolerated well.  Transported back to the room  Alert, without acute distress.  Hand-off given to patient's nurse.   Access used: Left upper arm fistula Access issues: none  Total UF removed: Medication(s) given: Hectoral   03/27/24 1045  Vitals  Temp 97.6 F (36.4 C)  Temp Source Oral  BP 125/66  MAP (mmHg) 84  Pulse Rate 94  ECG Heart Rate 93  Resp (!) 22  Oxygen  Therapy  SpO2 96 %  O2 Device Room Air  During Treatment Monitoring  HD Safety Checks Performed Yes  Intra-Hemodialysis Comments See progress note (Patient signed AMA paperwork with 56 minutes left on machine to go to bathroom on her floor)  Fistula / Graft Left Upper arm Arteriovenous fistula  Placement Date: (c) 09/29/23   Placed prior to admission: Yes  Orientation: Left  Access Location: Upper arm  Access Type: (c) Arteriovenous fistula  Site Condition No complications  Fistula / Graft Assessment Present;Thrill;Bruit  Status Patent;Deaccessed  Drainage Description None     Camellia Brasil LPN [facility]

## 2024-03-27 NOTE — Progress Notes (Signed)
 Occupational Therapy Treatment Patient Details Name: Gloria Gallagher MRN: [id number] DOB: [birth year] Today's Date: 03/27/2024   History of present illness The pt is a 73 yo female presenting 11/3 after a fall on blood thinners. CT showed C1-2 subluxation, neurosurgery consulted, and L-sided pubic rami fx. S/p Posterior cervical fusion C1-C2 11/10. PMH includes: anemia, arthritis, afib, CAD, ESRD on HD MWF, and neuropathy.   OT comments  Pt fatigued following HD, reports this is typical for her. Pt stood and ambulated to toilet in attempt to have BM with RW and CGA. Declined standing grooming. Returned to chair for grooming with set up. Soft collar covered with stockinette at pt's request. Educated in fall prevention. Pt is eager to return home with sister and friend's help tomorrow.       If plan is discharge home, recommend the following:  A little help with walking and/or transfers;A lot of help with bathing/dressing/bathroom;Assistance with cooking/housework;Assist for transportation;Help with stairs or ramp for entrance   Equipment Recommendations  None recommended by OT    Recommendations for Other Services      Precautions / Restrictions Precautions Precautions: Cervical;Fall Recall of Precautions/Restrictions: Intact Precaution/Restrictions Comments: Pt recalled cervical precautions. Soft collar: May remove when in bed: Yes; May ambulate to bathroom without brace: Yes; Apply/Remove Brace: While sitting; May remove brace to shower: Yes Required Braces or Orthoses: Cervical Brace Cervical Brace: Soft collar Restrictions Weight Bearing Restrictions Per Provider Order: Yes LLE Weight Bearing Per Provider Order: Weight bearing as tolerated       Mobility Bed Mobility               General bed mobility comments: received in chair    Transfers   Equipment used: Rolling walker (2 wheels) Transfers: Sit to/from Stand Sit to Stand: Contact guard assist            General transfer comment: cues for hand placement, increased time     Balance Overall balance assessment: Needs assistance   Sitting balance-Leahy Scale: Fair     Standing balance support: Bilateral upper extremity supported, Reliant on assistive device for balance Standing balance-Leahy Scale: Poor Standing balance comment: walker dependent                           ADL either performed or assessed with clinical judgement   ADL Overall ADL's : Needs assistance/impaired     Grooming: Wash/dry hands;Wash/dry face;Oral care;Sitting;Set up Grooming Details (indicate cue type and reason): two cup method for oral care in sitting               Lower Body Dressing Details (indicate cue type and reason): reports if she has any trouble reaching her feet, she can use her AE Toilet Transfer: Ambulation;Rolling walker (2 wheels);Contact guard assist   Toileting- Clothing Manipulation and Hygiene: Set up;Sitting/lateral lean       Functional mobility during ADLs: Contact guard assist;Rolling walker (2 wheels) General ADL Comments: placed stockinette over soiled part of soft collar at pt's request    Extremity/Trunk Assessment              Vision       Perception     Praxis     Communication Communication Communication: No apparent difficulties   Cognition Arousal: Alert Behavior During Therapy: WFL for tasks assessed/performed Cognition: No apparent impairments  Following commands: Intact        Cueing   Cueing Techniques: Verbal cues  Exercises      Shoulder Instructions       General Comments      Pertinent Vitals/ Pain       Pain Assessment Pain Assessment: Faces Faces Pain Scale: Hurts little more Pain Location: surgical site Pain Descriptors / Indicators: Grimacing, Sore Pain Intervention(s): Monitored during session, Repositioned  Home Living                                           Prior Functioning/Environment              Frequency  Min 2X/week        Progress Toward Goals  OT Goals(current goals can now be found in the care plan section)  Progress towards OT goals: Progressing toward goals  Acute Rehab OT Goals OT Goal Formulation: With patient Time For Goal Achievement: 03/31/24 Potential to Achieve Goals: Good  Plan      Co-evaluation                 AM-PAC OT 6 Clicks Daily Activity     Outcome Measure   Help from another person eating meals?: None Help from another person taking care of personal grooming?: A Little Help from another person toileting, which includes using toliet, bedpan, or urinal?: A Little Help from another person bathing (including washing, rinsing, drying)?: A Little Help from another person to put on and taking off regular upper body clothing?: A Little Help from another person to put on and taking off regular lower body clothing?: A Little 6 Click Score: 19    End of Session Equipment Utilized During Treatment: Cervical collar;Rolling walker (2 wheels);Gait belt  OT Visit Diagnosis: Unsteadiness on feet (R26.81);Other abnormalities of gait and mobility (R26.89);Pain;Muscle weakness (generalized) (M62.81)   Activity Tolerance Patient limited by fatigue   Patient Left in chair;with call bell/phone within reach   Nurse Communication          Time: 1430-1447 OT Time Calculation (min): 17 min  Charges: OT General Charges $OT Visit: 1 Visit OT Treatments $Self Care/Home Management : 8-22 mins  Mliss HERO, OTR/L Acute Rehabilitation Services Office: [phone]   Kennth Mliss Helling 03/27/2024, 3:09 PM

## 2024-03-27 NOTE — Progress Notes (Signed)
 PT Cancellation Note  Patient Details Name: Gloria Gallagher MRN: [id number] DOB: November 09, 1950   Cancelled Treatment:    Reason Eval/Treat Not Completed: (P) Fatigue/lethargy limiting ability to participate, pt politely declining mobility stating she was too fatigued from HD. Will check back as schedule allows to continue with PT POC.  Therisa SAUNDERS. PTA Acute Rehabilitation Services Office: [phone]   Therisa CHRISTELLA Boor 03/27/2024, 3:43 PM

## 2024-03-27 NOTE — Plan of Care (Signed)
  Problem: Clinical Measurements: Goal: Ability to maintain clinical measurements within normal limits will improve Outcome: Progressing   Problem: Activity: Goal: Risk for activity intolerance will decrease Outcome: Progressing   Problem: Nutrition: Goal: Adequate nutrition will be maintained Outcome: Progressing   Problem: Pain Managment: Goal: General experience of comfort will improve and/or be controlled Outcome: Progressing

## 2024-03-27 NOTE — Progress Notes (Signed)
   03/27/24 1059  Vitals  BP 119/61  MAP (mmHg) 78  Pulse Rate 92  ECG Heart Rate 92  Resp (!) 23  Oxygen  Therapy  SpO2 97 %  Patient Activity (if Appropriate) In chair  Pulse Oximetry Type Continuous  Oximetry Probe Site Changed No  Post Treatment  Dialyzer Clearance Lightly streaked  Hemodialysis Intake (mL) 0 mL  Liters Processed 55.7  Fluid Removed (mL) 600 mL  Tolerated HD Treatment Yes  AVG/AVF Arterial Site Held (minutes) 10 minutes  AVG/AVF Venous Site Held (minutes) 10 minutes

## 2024-03-27 NOTE — Progress Notes (Signed)
 [facility] Progress Note  Name: Gloria Gallagher MRN: [id number] DOB: 1950-08-24  Subjective:  Seen in room No new c/o's   Vitals:  Vitals:   03/27/24 1030 03/27/24 1045 03/27/24 1059 03/27/24 1136  BP: 118/68 125/66 119/61 (!) 123/58  Pulse: 84 94 92 80  Resp: 14 (!) 22 (!) 23   Temp:  97.6 F (36.4 C)  97.6 F (36.4 C)  TempSrc:  Oral  Oral  SpO2: 97% 96% 97% 98%  Weight:  66.4 kg    Height:         Physical Exam:    General adult female in bed in no acute distress  Neck in soft c-collar Lungs CTA bilat, 2L [place] O2 Heart S1S2 no rub Abdomen soft nontender nondistended Ext: no edema Neuro: alert and Ox3  Access: LUE AVF+b/t    OP HD: [facility] M[facility] 3h  B400   66.4kg    2K bath  AVF  Heparin  none Very good compliance Gets to dry wt, ave wg= 1- 3kg    Assessment/Plan:   # 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  - S/P neck surgery Monday 11/10  - per neurosurgery  # Fall with Left pubic rami fracture - Per orthopedic surgery - PT has seen   # ESRD - HD MWF -  HD today  # HTN/ volume - BP's good  - 11/03 cxr was clear - 2-3 kg under dry wt, 1 L UF next HD - lower dry wt on dc    #Anemia of CKD - No ESA indicated   # Metabolic bone disease - continue Hectorol  and Sensipar  - auryxia  for hyperphosphatemia   Myer Fret  MD  [facility] 03/27/2024, 2:47 PM  Recent Labs  Lab 03/24/24 0859 03/25/24 0431 03/26/24 0554  HGB 12.1 11.3* 11.0*  CALCIUM  8.8* 8.7*  --   CREATININE 6.28* 7.25*  --   K 4.7 4.5  --     Inpatient medications:  (feeding supplement) PROSource Plus  30 mL Oral BID BM   acetaminophen   1,000 mg Oral Q8H   amiodarone   200 mg Oral Daily   atorvastatin   20 mg Oral QPM   Chlorhexidine  Gluconate Cloth  6 each Topical Q0600   cilostazol   50 mg Oral BID   cinacalcet   90 mg Oral QPM   doxercalciferol   4 mcg Intravenous Q M,W,F-HD   ferric citrate   420 mg Oral TID WC   methocarbamol  1,000 mg Oral Q8H    multivitamin  1 tablet Oral QHS   sodium chloride  flush  3 mL Intravenous Q12H     hydrALAZINE , HYDROmorphone  (DILAUDID ) injection, menthol **OR** phenol, oxyCODONE , prochlorperazine, sodium chloride  flush

## 2024-03-27 NOTE — Progress Notes (Signed)
   Providing Compassionate, Quality Care - Together   Subjective: Patient reports soreness in her neck. No new issues.  Objective: Vital signs in last 24 hours: Temp:  [97.5 F (36.4 C)-98.3 F (36.8 C)] 97.6 F (36.4 C) (11/14 1045) Pulse Rate:  [74-94] 92 (11/14 1059) Resp:  [13-26] 23 (11/14 1059) BP: (116-148)/(55-68) 119/61 (11/14 1059) SpO2:  [96 %-97 %] 97 % (11/14 1059) Weight:  [67 kg] 67 kg (11/14 0802)  Intake/Output from previous day: 11/13 0701 - 11/14 0700 In: 483 [P.O.:480; I.V.:3] Out: -  Intake/Output this shift: Total I/O In: -  Out: 600 [Other:600]  Alert and oriented x 4 PERRLA CN II-XII grossly intact MAE, Strength and sensation are stable Incision is covered with Steri Strips; small amount of old sanguinous drainage on the Steri Strips  Lab Results: Recent Labs    03/25/24 0431 03/26/24 0554  WBC 6.4 6.8  HGB 11.3* 11.0*  HCT 35.3* 33.6*  PLT 166 160   BMET Recent Labs    03/25/24 0431  NA 130*  K 4.5  CL 90*  CO2 22  GLUCOSE 103*  BUN 31*  CREATININE 7.25*  CALCIUM  8.7*    Studies/Results: No results found.  Assessment/Plan: Patient underwemt C1-2 posterior fusion by Dr. Louis on 03/23/2024. She is slowly improving since surgery.   LOS: 11 days   -Plan is for discharge home tomorrow when patient's sister will be home to help her. -Patient should contact the office for a follow up appointment in one week. Information added to AVS.  I am in communication with my attending and they agree with the plan for this patient.   Gerard Beck, DNP, AGNP-C Nurse Practitioner  [facility] [address], [place], [place] [address] P: [phone]    F: [phone]  03/27/2024, 11:15 AM

## 2024-03-28 DIAGNOSIS — W19XXXS Unspecified fall, sequela: Secondary | ICD-10-CM | POA: Diagnosis not present

## 2024-03-28 DIAGNOSIS — S32509D Unspecified fracture of unspecified pubis, subsequent encounter for fracture with routine healing: Secondary | ICD-10-CM | POA: Diagnosis not present

## 2024-03-28 MED ORDER — ACETAMINOPHEN 500 MG PO TABS: 1000.0000 mg | ORAL_TABLET | Freq: Three times a day (TID) | ORAL | Status: AC | PRN

## 2024-03-28 MED ORDER — ONDANSETRON 4 MG PO TBDP: 4.0000 mg | ORAL_TABLET | Freq: Three times a day (TID) | ORAL | 0 refills | Status: AC | PRN

## 2024-03-28 NOTE — Plan of Care (Signed)

## 2024-03-28 NOTE — Discharge Instructions (Signed)

## 2024-03-28 NOTE — TOC Transition Note (Signed)
 Transition of Care [facility]) - Discharge Note   Patient Details  Name: Gloria Gallagher MRN: [id number] Date of Birth: 03-15-1951  [facility] CM/SW Contact:  Robynn Eileen Hoose, RN Phone Number: 03/28/2024, 1:09 PM   Clinical Narrative:   Patient is being discharged. Spoke with patient reports that she has not been using oxygen  recently and does not feel she needs an oxygen  tank to go home with. Patient confirmed that she has oxygen  at home.    Final next level of care: Home w Home Health Services Barriers to Discharge: Continued Medical Work up   Patient Goals and CMS Choice Patient states their goals for this hospitalization and ongoing recovery are:: to return to home CMS Medicare.gov Compare Post Acute Care list provided to:: Patient Choice offered to / list presented to : Patient, Adult Children      Discharge Placement                       Discharge Plan and Services Additional resources added to the After Visit Summary for     Discharge Planning Services: CM Consult Post Acute Care Choice: Home Health, Durable Medical Equipment          DME Arranged: Wheelchair manual DME Agency: [employer] Date DME Agency Contacted: 03/19/24 Time DME Agency Contacted: 1227 Representative spoke with at DME Agency: [place] HH Arranged: PT, OT, Nurse's Aide HH Agency: [facility] Date [facility] Agency Contacted: 03/19/24 Time HH Agency Contacted: 1227 Representative spoke with at [facility] Agency: [place]  Social Drivers of Health (SDOH) Interventions SDOH Screenings   Food Insecurity: Patient Declined (03/17/2024)  Housing: Unknown (03/19/2024)  Transportation Needs: Patient Declined (03/17/2024)  Utilities: Patient Declined (03/17/2024)  Social Connections: Patient Declined (03/17/2024)  Tobacco Use: Medium Risk (03/23/2024)     Readmission Risk Interventions    09/30/2023   12:10 PM  Readmission Risk Prevention Plan  Post Dischage Appt Complete   Medication Screening Complete  Transportation Screening Complete

## 2024-03-28 NOTE — Discharge Summary (Addendum)
 Physician Discharge Summary  Gloria Gallagher FMW:[id number] DOB: Apr 18, 1951  PCP: Gloria Reynolds, NP  Admitted from: Home Discharged to: Home  Admit date: 03/16/2024 Discharge date: 03/28/2024  Recommendations for Outpatient Follow-up:    Contact information for follow-up providers     Gloria Cough, MD Follow up in 4 week(s).   Specialty: Orthopedic Surgery Why: For X-ray and clinical follow up of left pelvis fractures Contact information: [address] [place] [address] [phone]         Gloria Shove, MD. Schedule an appointment as soon as possible for a visit in 1 week(s).   Specialty: Neurosurgery Contact information: [address] [place] [address] [phone]         Gloria Reynolds, NP. Schedule an appointment as soon as possible for a visit in 1 week(s).   Why: To be seen with repeat labs (CBC & BMP). Contact information: [address] [place] [address] [phone]         Hemodialysis center Follow up.   Why: Continue regular dialysis appointments on Mondays, Wednesdays and Fridays.             Contact information for after-discharge care     Home Medical Care     [facility]) .   Service: Home Health Services Contact information: [address] [place]  [address] [phone]                      Home Health: Home Health Orders (From admission, onward)     Start     Ordered   03/19/24 1224  Home Health  At discharge       Question Answer Comment  To provide the following care/treatments PT   To provide the following care/treatments OT   To provide the following care/treatments Home Health Aide      03/19/24 1224             Equipment/Devices:     Durable Medical Equipment  (From admission, onward)           Start     Ordered   03/19/24 1222  For home use only DME standard manual wheelchair  with seat cushion  Once       Comments: Patient suffers from Distance limited due to  pain, shortness of breath  and fatigue. which impairs their ability to perform daily activities like ambulate  in the home.  A cane  will not resolve issue with performing activities of daily living. A wheelchair will allow patient to safely perform daily activities. Patient can safely propel the wheelchair in the home or has a caregiver who can provide assistance. Length of need lifetime . Accessories: elevating leg rests (ELRs), wheel locks, extensions and anti-tippers.  Seat and back cushions   03/19/24 1222             Discharge Condition: Improved and stable   Code Status: Full Code Diet recommendation:  Discharge Diet Orders (From admission, onward)     Start     Ordered   03/28/24 0000  Diet - low sodium heart healthy        03/28/24 1247             Discharge Diagnoses:  Principal Problem:   [facility] Course:   73 y.o. female with medical history significant for ESRD on HD MWF, PVD, PAF on Eliquis , chronic  HFpEF, who presents to the ER after a mechanical fall at home. The patient was attempting to hit a fly, lost her balance and fell, LOC. Level 2 trauma was activated due to the patient being on blood thinners. Report pelvic pain. Left-sided pubic rami fracture and C1-C2 subluxation revealed on CT scan.  Seen by trauma team and orthopedic surgery.  Recommended nonoperative management.  Weightbearing as tolerated.  Return to clinic in 4 weeks for x-ray following discharge.  Pain control.  Underwent C-spine surgery by neurosurgery on 11/10 continued to see her postoperatively and have cleared her for discharge home.     Assessment & Plan:    S/p mechanical fall Left inferior and superior pubic rami fracture Trauma and orthopedics consulted.  Nonoperative management.  WBAT LLE. Return to clinic in 4 weeks for x-rays.  Follow-up with Dr. Donnice Gallagher. Therapies evaluated  and recommend home health PT and OT.   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 S/p posterior cervical fusion C1-C2 on 11/10. Management per neurosurgery.  Has C-collar, continue. Neurosurgery continue to follow, appreciate their input.  Cleared for DC from their standpoint with follow-up in the office in 1 week. Patient has not used any pain meds other than Tylenol .  Patient requesting some medication for nausea, Zofran  ODT prescribed, short supply.   Paroxysmal A-fib Rate controlled on amiodarone . Eliquis  was on hold for surgery.  As per neurosurgery follow-up, recommend restarting Eliquis  at 1 week postop.  Recommend restarting 11/18. Long-term anticoagulation decision deferred to cardiology during outpatient follow-up. TTE this admission showed LVEF of 60 to 65%   ESRD on MWF HD Hyponatremia Nephrology following for HD needs.  S/p HD 11/12 with 2 L ultrafiltration.  Seen on HD 11/14.  Outpatient follow-up for dialysis needs.   PAD Continue atorvastatin .  Resumed Pletal .  Patient not taking ASA or Plavix  PTA.  Dilatation of aortic root, measuring 39 mm Dilatation of ascending aorta, measuring 45 mm As on 2D echo 11/17 Outpatient follow-up as deemed necessary.  If not already done, could consider vascular surgery consultation  Stable 11 mm right lower lobe pulmonary nodule Seen on CT C/A/P on 11/3 Follow-up with repeat CT in 1 hour to ensure stability.   Body mass index is 24.36 kg/m.     Consultants:   Trauma Orthopedics Nephrology Neurosurgery   Procedures:   As noted above   Discharge Instructions  Discharge Instructions     Call MD for:  difficulty breathing, headache or visual disturbances   Complete by: As directed    Call MD for:  extreme fatigue   Complete by: As directed    Call MD for:  persistant dizziness or light-headedness   Complete by: As directed    Call MD for:  persistant nausea and vomiting   Complete by:  As directed    Call MD for:  redness, tenderness, or signs of infection (pain, swelling, redness, odor or green/yellow discharge around incision site)   Complete by: As directed    Call MD for:  severe uncontrolled pain   Complete by: As directed    Call MD for:  temperature >100.4   Complete by: As directed    Diet - low sodium heart healthy   Complete by: As directed    Increase activity slowly   Complete by: As directed    No wound care   Complete by: As directed         Medication List     PAUSE taking  these medications    Eliquis  5 MG Tabs tablet Wait to take this until: March 31, 2024 Generic drug: apixaban  TAKE 1 TABLET BY MOUTH TWICE  DAILY       STOP taking these medications    aspirin  EC 81 MG tablet   clopidogrel  75 MG tablet Commonly known as: Plavix        TAKE these medications    acetaminophen  500 MG tablet Commonly known as: TYLENOL  Take 2 tablets (1,000 mg total) by mouth every 8 (eight) hours as needed for headache, fever, mild pain (pain score 1-3) or moderate pain (pain score 4-6). What changed:  when to take this reasons to take this   amiodarone  200 MG tablet Commonly known as: PACERONE  TAKE 1 TABLET BY MOUTH DAILY   atorvastatin  20 MG tablet Commonly known as: LIPITOR Take 20 mg by mouth every evening.   cilostazol  50 MG tablet Commonly known as: PLETAL  Take 1 tablet (50 mg total) by mouth 2 (two) times daily.   cinacalcet  90 MG tablet Commonly known as: SENSIPAR  Take 90 mg by mouth every evening.   ferric citrate  1 GM 210 MG(Fe) tablet Commonly known as: AURYXIA  Take 1 tablet (210 mg total) by mouth 3 (three) times daily with meals. What changed:  how much to take when to take this additional instructions   HECTOROL  IV Inject 4 mcg into the vein every Monday, Wednesday, and Friday with hemodialysis. Received at and provided by dialysis clinic.   multivitamin Tabs tablet Take 1 tablet by mouth at bedtime. What  changed: when to take this   ondansetron  4 MG disintegrating tablet Commonly known as: ZOFRAN -ODT Take 1 tablet (4 mg total) by mouth every 8 (eight) hours as needed for nausea or vomiting.       Allergies  Allergen Reactions   Penicillins Hives and Itching   Shrimp (Diagnostic) Hives      Procedures/Studies: DG Cervical Spine 2 or 3 views Result Date: 03/23/2024 EXAM: FLUOROSCOPIC IMAGES, 2 or 3 Views TECHNIQUE: Fluoroscopy was provided by the radiology department for procedure. Radiologist was not present during examination. FLUOROSCOPY DOSE AND TYPE: Radiation Dose Index: Reference Air Kerma (in mGy) = 3.35 Fluoroscopy Time: 46 seconds Total Images: 3 COMPARISON: None available. CLINICAL HISTORY: [id number] Surgery, elective [id number]. FINDINGS: Intraoperative fluoroscopic imaging was performed. Known fracture at the base of the odontoid is well aligned on the initial images. Subsequent posterior fusion was performed at C1-C2. IMPRESSION: 1. Well-aligned known fracture at the base of the odontoid. 2. Status post posterior C1C2 fusion placement. NOTE: Intraoperative fluoroscopic spot images as above. Please refer to the intraoperative report for full details. Electronically signed by: Oneil Devonshire MD 03/23/2024 08:20 PM EST RP Workstation: [id number] C-Arm 1-60 Min-No Report Result Date: 03/23/2024 Fluoroscopy was utilized by the requesting physician.  No radiographic interpretation.   DG C-Arm 1-60 Min-No Report Result Date: 03/23/2024 Fluoroscopy was utilized by the requesting physician.  No radiographic interpretation.   DG C-Arm 1-60 Min-No Report Result Date: 03/23/2024 Fluoroscopy was utilized by the requesting physician.  No radiographic interpretation.   ECHOCARDIOGRAM COMPLETE Result Date: 03/20/2024    ECHOCARDIOGRAM REPORT   Patient Name:   Gloria Gallagher Date of Exam: 03/20/2024 Medical Rec #:  [id number]         Height:       65.0 in Accession #:    [id number]         Weight:       149.9 lb Date of  Birth:  1950-07-04        BSA:          1.750 m Patient Age:    72 years          BP:           136/66 mmHg Patient Gender: F                 HR:           62 bpm. Exam Location:  Inpatient Procedure: 2D Echo, Cardiac Doppler and Color Doppler (Both Spectral and Color            Flow Doppler were utilized during procedure). Indications:    Pre Op Z01.810  History:        Patient has prior history of Echocardiogram examinations, most                 recent 10/02/2022. H/O Cardiac Tamponade, CAD, Aortic Valve                 Disease, Arrythmias:Atrial Fibrillation,                 Signs/Symptoms:Shortness of Breath and Murmur; Risk                 Factors:Hypertension.  Sonographer:    BERNARDA ROCKS Referring Phys: [id number] NKEIRUKA J EZENDUKA IMPRESSIONS  1. Left ventricular ejection fraction, by estimation, is 60 to 65%. The left ventricle has normal function. The left ventricle has no regional wall motion abnormalities. The left ventricular internal cavity size was dilated.  2. Right ventricular systolic function is normal. The right ventricular size is normal. Mildly increased right ventricular wall thickness. There is mildly elevated pulmonary artery systolic pressure. The estimated right ventricular systolic pressure is 37.4 mmHg.  3. Left atrial size was severely dilated.  4. Right atrial size was severely dilated.  5. The mitral valve is degenerative. Moderate mitral valve regurgitation. No evidence of mitral stenosis. Moderate to severe mitral annular calcification.  6. The aortic valve is tricuspid. Aortic valve regurgitation is mild to moderate. Aortic valve sclerosis is present, with no evidence of aortic valve stenosis.  7. Aortic dilatation noted. There is dilatation of the aortic root, measuring 39 mm. There is dilatation of the ascending aorta, measuring 45 mm.  8. The inferior vena cava is normal in size with <50% respiratory variability, suggesting right atrial pressure  of 8 mmHg. Comparison(s): A prior study was performed on 10/02/2022. LVEF 60-65%, mild LVH, moderate biatrial enlargement, large pericardial effusion with tamponade physiology, marked cystic changes in the left lobe of the liver and there is evidence of severe polycystic changes of the neighboring kidney tissue, aortic root 41 mm, ascending aorta 47 mm, estimated RAP 15 mmHg. FINDINGS  Left Ventricle: Left ventricular ejection fraction, by estimation, is 60 to 65%. The left ventricle has normal function. The left ventricle has no regional wall motion abnormalities. The left ventricular internal cavity size was dilated. There is no left ventricular hypertrophy. Left ventricular diastolic function could not be evaluated due to mitral annular calcification (moderate or greater). Right Ventricle: The right ventricular size is normal. Mildly increased right ventricular wall thickness. Right ventricular systolic function is normal. There is mildly elevated pulmonary artery systolic pressure. The tricuspid regurgitant velocity is 2.71 m/s, and with an assumed right atrial pressure of 8 mmHg, the estimated right ventricular systolic pressure is 37.4 mmHg. Left Atrium: Left atrial size was severely dilated. Right Atrium: Right atrial  size was severely dilated. Pericardium: There is no evidence of pericardial effusion. Mitral Valve: The mitral valve is degenerative in appearance. Moderate to severe mitral annular calcification. Moderate mitral valve regurgitation. No evidence of mitral valve stenosis. MV peak gradient, 8.5 mmHg. The mean mitral valve gradient is 3.0 mmHg. Tricuspid Valve: The tricuspid valve is normal in structure. Tricuspid valve regurgitation is mild . No evidence of tricuspid stenosis. Aortic Valve: The aortic valve is tricuspid. Aortic valve regurgitation is mild to moderate. Aortic regurgitation PHT measures 135 msec. Aortic valve sclerosis is present, with no evidence of aortic valve stenosis. Aortic  valve mean gradient measures 5.0  mmHg. Aortic valve peak gradient measures 13.1 mmHg. Aortic valve area, by VTI measures 3.03 cm. Pulmonic Valve: The pulmonic valve was normal in structure. Pulmonic valve regurgitation is mild. No evidence of pulmonic stenosis. Aorta: Aortic dilatation noted. There is dilatation of the aortic root, measuring 39 mm. There is dilatation of the ascending aorta, measuring 45 mm. Venous: The inferior vena cava is normal in size with less than 50% respiratory variability, suggesting right atrial pressure of 8 mmHg. IAS/Shunts: The atrial septum is grossly normal.  LEFT VENTRICLE PLAX 2D LVIDd:         5.80 cm      Diastology LVIDs:         3.40 cm      LV e' medial:    10.90 cm/s LV PW:         1.00 cm      LV E/e' medial:  12.3 LV IVS:        1.00 cm      LV e' lateral:   14.90 cm/s LVOT diam:     2.00 cm      LV E/e' lateral: 9.0 LV SV:         99 LV SV Index:   57 LVOT Area:     3.14 cm  LV Volumes (MOD) LV vol d, MOD A2C: 215.0 ml LV vol d, MOD A4C: 224.0 ml LV vol s, MOD A2C: 76.1 ml LV vol s, MOD A4C: 78.5 ml LV SV MOD A2C:     138.9 ml LV SV MOD A4C:     224.0 ml LV SV MOD BP:      142.1 ml RIGHT VENTRICLE             IVC RV Basal diam:  4.60 cm     IVC diam: 1.80 cm RV S prime:     14.90 cm/s TAPSE (M-mode): 2.0 cm RVSP:           32.4 mmHg LEFT ATRIUM              Index        RIGHT ATRIUM           Index LA diam:        5.40 cm  3.09 cm/m   RA Pressure: 3.00 mmHg LA Vol (A2C):   164.0 ml 93.71 ml/m  RA Area:     31.20 cm LA Vol (A4C):   170.0 ml 97.14 ml/m  RA Volume:   124.00 ml 70.86 ml/m LA Biplane Vol: 173.0 ml 98.85 ml/m  AORTIC VALVE                     PULMONIC VALVE AV Area (Vmax):    2.97 cm      PV Vmax:       1.55 m/s AV Area (Vmean):  2.79 cm      PV Peak grad:  9.6 mmHg AV Area (VTI):     3.03 cm AV Vmax:           181.00 cm/s AV Vmean:          103.000 cm/s AV VTI:            0.327 m AV Peak Grad:      13.1 mmHg AV Mean Grad:      5.0 mmHg LVOT Vmax:          171.00 cm/s LVOT Vmean:        91.400 cm/s LVOT VTI:          0.315 m LVOT/AV VTI ratio: 0.96 AI PHT:            135 msec  AORTA Ao Root diam: 3.90 cm Ao Asc diam:  4.50 cm MITRAL VALVE                TRICUSPID VALVE MV Area (PHT): 4.77 cm     TR Peak grad:   29.4 mmHg MV Area VTI:   3.64 cm     TR Mean grad:   29.0 mmHg MV Peak grad:  8.5 mmHg     TR Vmax:        271.00 cm/s MV Mean grad:  3.0 mmHg     Estimated RAP:  3.00 mmHg MV Vmax:       1.46 m/s     RVSP:           32.4 mmHg MV Vmean:      75.3 cm/s MV Decel Time: 159 msec     SHUNTS MR Peak grad: 106.5 mmHg    Systemic VTI:  0.32 m MR Mean grad: 106.0 mmHg    Systemic Diam: 2.00 cm MR Vmax:      516.00 cm/s MV E velocity: 134.00 cm/s MV A velocity: 109.00 cm/s MV E/A ratio:  1.23 Sunit Tolia Electronically signed by Madonna Large Signature Date/Time: 03/20/2024/1:44:06 PM    Final    CT CHEST ABDOMEN PELVIS W CONTRAST Result Date: 03/16/2024 EXAM: CT CHEST, ABDOMEN AND PELVIS WITH CONTRAST 03/16/2024 10:32:39 PM TECHNIQUE: CT of the chest, abdomen and pelvis was performed with the administration of 75 mL of iohexol  (OMNIPAQUE ) 350 MG/ML injection. Multiplanar reformatted images are provided for review. Automated exposure control, iterative reconstruction, and/or weight based adjustment of the mA/kV was utilized to reduce the radiation dose to as low as reasonably achievable. COMPARISON: 11/20/2022 CLINICAL HISTORY: fall on thinners FINDINGS: CHEST: MEDIASTINUM AND LYMPH NODES: Marked cardiomegaly coronary artery and aortic atherosclerosis. Marked dilatation of the main pulmonary artery measuring 6.4 cm. The central airways are clear. No mediastinal, hilar or axillary lymphadenopathy. LUNGS AND PLEURA: Moderate emphysema. An 11 mm right lower lobe pulmonary nodule is stable. Left base atelectasis. No focal consolidation or pulmonary edema. No pleural effusion or pneumothorax. ABDOMEN AND PELVIS: LIVER: Innumerable cysts throughout the liver unchanged  since prior study. GALLBLADDER AND BILE DUCTS: Gallbladder is unremarkable. No biliary ductal dilatation. SPLEEN: No acute abnormality. PANCREAS: No acute abnormality. ADRENAL GLANDS: No acute abnormality. KIDNEYS, URETERS AND BLADDER: Innumerable cysts replace much of the renal parenchyma bilaterally, compatible with polycystic kidney disease, unchanged. No stones in the kidneys or ureters. No hydronephrosis. No perinephric or periureteral stranding. Urinary bladder decompressed, grossly unremarkable. GI AND BOWEL: Stomach demonstrates no acute abnormality. Diffuse colonic diverticulosis. There is no bowel obstruction. REPRODUCTIVE ORGANS: Calcified fibroids in the uterus. PERITONEUM AND  RETROPERITONEUM: No ascites. No free air. VASCULATURE: Aorta is normal in caliber with aortic and aortoiliac atherosclerosis. ABDOMINAL AND PELVIS LYMPH NODES: No lymphadenopathy. BONES AND SOFT TISSUES: No acute osseous abnormality. No focal soft tissue abnormality. IMPRESSION: 1. No evidence of acute traumatic injury. 2. Marked dilatation of the main pulmonary artery measuring 6.4 cm, which may reflect pulmonary hypertension. 3. Stable 11 mm right lower lobe pulmonary nodule. This could be followed with repeat CT in 1 year to ensure 2 years stability. 4. Marked cardiomegaly. 5. Emphysema. 6. Polycystic kidney disease. 7. Diffuse colonic diverticulosis. Electronically signed by: Franky Crease MD 03/16/2024 11:06 PM EST RP Workstation: [id number]   MR Cervical Spine Wo Contrast Result Date: 03/16/2024 EXAM: MRI CERVICAL SPINE WITH AND WITHOUT CONTRAST 03/16/2024 09:13:37 PM TECHNIQUE: Multiplanar multisequence MRI of the cervical spine was performed without and with the administration of intravenous contrast. COMPARISON: CT cervical spine 03/16/2021 and 01/17/2022. CLINICAL HISTORY: Neck trauma, ligament injury suspected (Age >= 16y); ?AA subluxation s/p fall. FINDINGS: LIMITATIONS: Examination limited by motion artifact. BONES  AND ALIGNMENT: Remote ununited displaced type 2 dens fracture. No acute findings of cervical spine fracture is identified. Other than the malalignment of the fracture, the cervical vertebral bodies are normally aligned. As demonstrated on the CT scan, there is mild posterior subluxation of C2 compared to the lateral masses of C1. This could be acute or chronic. SPINAL CORD: Abnormal T2 and STIR signal intensity in the left side of the cervical cord at C2 at the level of the patient's fracture likely an old injury with myelomalacia but an acute cord contusion cannot be completely excluded. No evidence of hemorrhage but the gradient echo sequence is quite limited. SOFT TISSUES: No paraspinal mass. No obvious hematoma. I don't see any definite surrounding fluid collections or edema to suggest an acute ligamentous injury, but the study is limited. NO DISC PROTRUSIONS, SPINAL OR FORAMINAL STENOSIS. IMPRESSION: 1. Remote ununited displaced type 2 dens fracture with mild posterior subluxation of C2 relative to the lateral masses of C1, indeterminate acuity given motion-limited study without definitive edema or fluid collection to suggest acute ligamentous injury. 2. No acute cervical spine fracture identified. 3. 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 Could not totally exclude the possibility of an acute contusion. Electronically signed by: Maude Stammer MD 03/16/2024 09:48 PM EST RP Workstation: [id number]   CT Cervical Spine Wo Contrast Result Date: 03/16/2024 EXAM: CT CERVICAL SPINE WITHOUT CONTRAST 03/16/2024 07:47:29 PM TECHNIQUE: CT of the cervical spine was performed without the administration of intravenous contrast. Multiplanar reformatted images are provided for review. Automated exposure control, iterative reconstruction, and/or weight based adjustment of the mA/kV was utilized to reduce the radiation dose to as  low as reasonably achievable. COMPARISON: ct c spine 9.6.23 CLINICAL HISTORY: Neck trauma (Age >= 65y) FINDINGS: CERVICAL SPINE: BONES AND ALIGNMENT: Chronic non-fused healed type 2 dens fracture with interval increased subluxation of the dens anteriorly in relation to the remainder of the C2 vertebral body (6 mm compared to prior 4 mm). Only partial articulation of the C1 lateral masses and C2 articular facets (6:20,36). No acute fracture or traumatic malalignment. DEGENERATIVE CHANGES: Interval stenosis and mass effect on the central canal at the C1-C2 level (6:28). SOFT TISSUES: Atherosclerotic plaque in the carotid arteries within the neck. No prevertebral soft tissue swelling. Recommend further evaluation with MRI cervical spine noncontrast. IMPRESSION: 1. Chronic non-fused healed type 2 dens fracture  with concern for acute atlantoaxial subluxation. Recommend C-collar placement and noncontrast MRI of the cervical spine for further evaluation. 2. No acute fracture. 3. These findings were discussed with Dr. Pamella over the phone with Dr. Margarite on 04/12/2024 at 08:09 pm . Electronically signed by: Kate Margarite MD 03/16/2024 08:11 PM EST RP Workstation: [id number]   DG Hip Unilat W or Wo Pelvis 2-3 Views Left Result Date: 03/16/2024 EXAM: 2 or 3 VIEW(S) XRAY OF THE LEFT HIP 03/16/2024 07:55:00 PM COMPARISON: CT abdomen and pelvis 11/20/22 CLINICAL HISTORY: L hip pain s/p fall FINDINGS: BONES AND JOINTS: Left inferior pubic ramus lucency suggestive of underlying fracture. The hip joint is maintained. No significant degenerative changes. Grossly unremarkable sacrum -limited evaluation due to overlapping osseous structures and overlying soft tissues. SOFT TISSUES: Severe atherosclerotic plaque. Coarsened calcifications overlying the pelvis consistent with known fibroids. IMPRESSION: 1. Left inferior pubic ramus lucency suspicious for fracture. Electronically signed by: Morgane Naveau MD 03/16/2024 08:01 PM EST RP  Workstation: [id number]   DG Knee 2 Views Left Result Date: 03/16/2024 EXAM: 1 or 2 VIEW(S) XRAY OF THE LEFT KNEE 03/16/2024 07:55:00 PM COMPARISON: None available. CLINICAL HISTORY: L hip pain s/p fall FINDINGS: BONES AND JOINTS: No acute fracture. No focal osseous lesion. No joint dislocation. No significant joint effusion. No significant degenerative changes. SOFT TISSUES: The soft tissues are unremarkable. VASCULATURE: Severe atherosclerotic plaque. IMPRESSION: 1. No acute fracture or dislocation. 2. Severe atherosclerotic plaque. Electronically signed by: Morgane Naveau MD 03/16/2024 07:59 PM EST RP Workstation: [id number]   CT Head Wo Contrast Result Date: 03/16/2024 EXAM: CT HEAD WITHOUT CONTRAST 03/16/2024 07:47:29 PM TECHNIQUE: CT of the head was performed without the administration of intravenous contrast. Automated exposure control, iterative reconstruction, and/or weight based adjustment of the mA/kV was utilized to reduce the radiation dose to as low as reasonably achievable. COMPARISON: CT head 01/17/22 CLINICAL HISTORY: Head trauma, minor (Age >= 65y). FINDINGS: BRAIN AND VENTRICLES: Diffuse cerebral and cerebellar atrophy. Patchy and confluent areas of decreased attenuation are noted throughout the deep and periventricular white matter of the cerebral hemispheres bilaterally, suggestive of chronic microvascular ischemic changes. Atherosclerotic calcifications are present within the cavernous internal carotid and vertebral arteries. No acute hemorrhage. No evidence of acute infarct. No hydrocephalus. No extra-axial collection. No mass effect or midline shift. ORBITS: No acute abnormality. SINUSES: Paranasal sinuses are clear. SOFT TISSUES AND SKULL: Partial right mastoid air cell effusion. No fluid within the right middle ear. Partial left mastoid air cell effusion. No fluid within the left middle ear. No acute soft tissue abnormality. No skull fracture. IMPRESSION: 1. No acute intracranial  abnormality. 2. Partial right and left mastoid air cell effusions. Electronically signed by: Morgane Naveau MD 03/16/2024 07:58 PM EST RP Workstation: [id number]   DG Pelvis Portable Result Date: 03/16/2024 CLINICAL DATA:  Trauma EXAM: PORTABLE PELVIS 1-2 VIEWS COMPARISON:  None Available. FINDINGS: Osteopenia.No evidence of pelvic bone diastasis.Subtle lucency through the inferior pubic ramus on the left. Subtle cortical irregularity may also be present along the superior pubic ramus on the left.There is no evidence of arthropathy or other focal bone abnormality. Diffuse aortoiliac atherosclerosis with extensive peripheral vascular atherosclerosis. IMPRESSION: Osteopenia. Findings worrisome for left inferior and superior pubic rami fractures. Electronically Signed   By: Rogelia Myers M.D.   On: 03/16/2024 19:32   DG Chest Port 1 View Result Date: 03/16/2024 CLINICAL DATA:  Trauma , fall EXAM: PORTABLE CHEST - 1 VIEW COMPARISON:  Oct 02, 2022, Oct 01, 2022 FINDINGS: Chronic coarsening  of the pulmonary interstitium with streaky opacities in the left mid and lower lung zones, likely emphysema with atelectasis or scarring, unchanged. No new airspace consolidation, pleural effusion, or pneumothorax. Similar elevation of the left hemidiaphragm. Mild cardiomegaly. Interval decrease in size of the pericardial effusion. Tortuous aorta with aortic atherosclerosis. No acute fracture or destructive lesions. Multilevel thoracic osteophytosis. Likely loose body in the left shoulder joint. Diffuse osteopenia. IMPRESSION: No acute cardiopulmonary abnormality. Electronically Signed   By: Rogelia Myers M.D.   On: 03/16/2024 19:28      Subjective: Denies complaints.  No pain.  Mild soreness of the neck.  Just getting ready to put on her cervical collar.  Discharge Exam:  Vitals:   03/27/24 1136 03/27/24 1827 03/27/24 2207 03/28/24 0534  BP: (!) 123/58 120/63 133/66 (!) 144/84  Pulse: 80 91 87 79  Resp:       Temp: 97.6 F (36.4 C) 97.7 F (36.5 C) 98 F (36.7 C) (!) 97.4 F (36.3 C)  TempSrc: Oral  Oral Oral  SpO2: 98% 93% 97% 99%  Weight:      Height:        General exam: Elderly female, moderately built and nourished sitting up in chair without distress. Respiratory system: Clear to auscultation. Respiratory effort normal. Cardiovascular system: S1 & S2 heard, irregularly irregular. No JVD, murmurs, rubs, gallops or clicks.  1+ pitting bilateral leg edema, chronic and has been present for the last couple days.  Stable. Gastrointestinal system: Abdomen is nondistended, soft and nontender. No organomegaly or masses felt. Normal bowel sounds heard. Central nervous system: Alert and oriented. No focal neurological deficits. Extremities: Symmetric 5 x 5 power. Skin: No rashes, lesions or ulcers Psychiatry: Judgement and insight appear normal. Mood & affect appropriate.     The results of significant diagnostics from this hospitalization (including imaging, microbiology, ancillary and laboratory) are listed below for reference.     Microbiology: Recent Results (from the past 240 hours)  Surgical pcr screen     Status: None   Collection Time: 03/21/24  3:05 PM   Specimen: Nasal Mucosa; Nasal Swab  Result Value Ref Range Status   MRSA, PCR NEGATIVE NEGATIVE Final   Staphylococcus aureus NEGATIVE NEGATIVE Final    Comment: (NOTE) The Xpert SA Assay (FDA approved for NASAL specimens in patients 90 years of age and older), is one component of a comprehensive surveillance program. It is not intended to diagnose infection nor to guide or monitor treatment. Performed at [facility], [address] [place], [place] [address]      Labs: CBC: Recent Labs  Lab 03/23/24 0539 03/23/24 1551 03/24/24 0859 03/25/24 0431 03/26/24 0554  WBC 3.4*  --  6.1 6.4 6.8  NEUTROABS 2.1  --  5.3 4.7  --   HGB 11.1* 13.6 12.1 11.3* 11.0*  HCT 33.9* 40.0 37.5 35.3* 33.6*  MCV 92.6  --   94.2 94.1 92.6  PLT 149*  --  186 166 160    Basic Metabolic Panel: Recent Labs  Lab 03/22/24 0503 03/23/24 0539 03/23/24 1551 03/24/24 0859 03/25/24 0431  NA 129* 130* 131* 130* 130*  K 4.3 4.0 4.3 4.7 4.5  CL 89* 89* 90* 89* 90*  CO2 23 24  --  23 22  GLUCOSE 88 95 91 119* 103*  BUN 28* 34* 25* 24* 31*  CREATININE 6.67* 8.05* 5.30* 6.28* 7.25*  CALCIUM  8.3* 8.2*  --  8.8* 8.7*    Discussed in detail with patient's  daughter via phone, updated care and answered all questions.    Time coordinating discharge: 35 minutes  SIGNED:  Trenda Mar, MD,  FACP, [facility], [facility], [facility]   [facility] Ad[facility] [place]     To contact the attending provider between 7A-7P or the covering provider during after hours 7P-7A, please log into the web site [email or web address] and access using universal [place] password for that web site. If you do not have the password, please call the hospital operator.

## 2024-03-28 NOTE — Progress Notes (Signed)
 Patient ID: Gloria Gallagher, female   DOB: 05/16/1950, 73 y.o.   MRN: [id number] Seems to be stable.  Not much neck pain.  Awaiting discharge, please call us  for any questions or concerns

## 2024-03-29 NOTE — Discharge Planning (Signed)
 [facility] Patient Discharge Orders- [facility] CLINIC: [place]  Patient's name: Gloria Gallagher Admit/DC Dates: 03/16/2024 - 03/28/2024  Discharge Diagnoses: L inferior and superior pubic rami fracture   Displaced type II odontoid fracture  Aranesp: Given: no   Date and amount of last dose: N/A  Last Hgb: 11.0 PRBC's Given: no Date/# of units: N/A ESA dose for discharge: none IV Iron  dose at discharge: same dose  Heparin  change: no  EDW Change: no New EDW:   [place] Change: no  Access intervention/Change: no Details:  Hectorol /Calcitriol change: no  Discharge Labs: Calcium  8.7 Phosphorus 4.6 Albumin  3.4 K+ 4.5  IV Antibiotics: no Details:  On Coumadin?: no Last INR: Next INR: Managed By:   OTHER/APPTS/LAB ORDERS:    D/C Meds to be reconciled by nurse after every discharge.  Completed By: Lucie Collet, PA-C 03/29/2024, 4:17 PM   [facility] Pager: [phone]    Reviewed by: MD:______ RN_______

## 2024-03-30 NOTE — Progress Notes (Signed)
 Late note entry 11/17 0844  D/c over weekend noted. Contacted out-pt HD clinic, [facility] [address] [facility], to inform of pt d/c and anticipated arrival back to clinic this morning. No further support needed.   Acquanetta Cabanilla Dialysis Navigator [phone]

## 2024-03-31 ENCOUNTER — Ambulatory Visit: Admitting: Podiatry

## 2024-04-06 ENCOUNTER — Encounter: Payer: Self-pay | Admitting: Oncology

## 2024-04-07 ENCOUNTER — Other Ambulatory Visit: Payer: Self-pay

## 2024-04-07 ENCOUNTER — Emergency Department (HOSPITAL_COMMUNITY)
Admission: EM | Admit: 2024-04-07 | Discharge: 2024-04-07 | Disposition: A | Discharge status: Home / Self Care | Attending: Emergency Medicine | Admitting: Emergency Medicine

## 2024-04-07 ENCOUNTER — Emergency Department (HOSPITAL_COMMUNITY)

## 2024-04-07 DIAGNOSIS — I2510 Atherosclerotic heart disease of native coronary artery without angina pectoris: Secondary | ICD-10-CM | POA: Insufficient documentation

## 2024-04-07 DIAGNOSIS — R519 Headache, unspecified: Secondary | ICD-10-CM | POA: Insufficient documentation

## 2024-04-07 DIAGNOSIS — N186 End stage renal disease: Secondary | ICD-10-CM | POA: Insufficient documentation

## 2024-04-07 DIAGNOSIS — Z992 Dependence on renal dialysis: Secondary | ICD-10-CM | POA: Diagnosis not present

## 2024-04-07 DIAGNOSIS — M542 Cervicalgia: Secondary | ICD-10-CM | POA: Diagnosis not present

## 2024-04-07 DIAGNOSIS — I120 Hypertensive chronic kidney disease with stage 5 chronic kidney disease or end stage renal disease: Secondary | ICD-10-CM | POA: Insufficient documentation

## 2024-04-07 LAB — CBC WITH DIFFERENTIAL/PLATELET
Abs Immature Granulocytes: 0.04 K/uL (ref 0.00–0.07)
Basophils Absolute: 0.1 K/uL (ref 0.0–0.1)
Basophils Relative: 1 %
Eosinophils Absolute: 0 K/uL (ref 0.0–0.5)
Eosinophils Relative: 1 %
HCT: 34.3 % — ABNORMAL LOW (ref 36.0–46.0)
Hemoglobin: 11.3 g/dL — ABNORMAL LOW (ref 12.0–15.0)
Immature Granulocytes: 1 %
Lymphocytes Relative: 15 %
Lymphs Abs: 0.9 K/uL (ref 0.7–4.0)
MCH: 30.6 pg (ref 26.0–34.0)
MCHC: 32.9 g/dL (ref 30.0–36.0)
MCV: 93 fL (ref 80.0–100.0)
Monocytes Absolute: 0.5 K/uL (ref 0.1–1.0)
Monocytes Relative: 8 %
Neutro Abs: 4.1 K/uL (ref 1.7–7.7)
Neutrophils Relative %: 74 %
Platelets: 172 K/uL (ref 150–400)
RBC: 3.69 MIL/uL — ABNORMAL LOW (ref 3.87–5.11)
RDW: 16.8 % — ABNORMAL HIGH (ref 11.5–15.5)
WBC: 5.5 K/uL (ref 4.0–10.5)
nRBC: 0 % (ref 0.0–0.2)

## 2024-04-07 LAB — BASIC METABOLIC PANEL WITH GFR
Anion gap: 18 — ABNORMAL HIGH (ref 5–15)
BUN: 39 mg/dL — ABNORMAL HIGH (ref 8–23)
CO2: 26 mmol/L (ref 22–32)
Calcium: 8.7 mg/dL — ABNORMAL LOW (ref 8.9–10.3)
Chloride: 90 mmol/L — ABNORMAL LOW (ref 98–111)
Creatinine, Ser: 7.74 mg/dL — ABNORMAL HIGH (ref 0.44–1.00)
GFR, Estimated: 5 mL/min — ABNORMAL LOW (ref >60)
Glucose, Bld: 86 mg/dL (ref 70–99)
Potassium: 4.2 mmol/L (ref 3.5–5.1)
Sodium: 134 mmol/L — ABNORMAL LOW (ref 135–145)

## 2024-04-07 LAB — I-STAT CHEM 8, ED
BUN: 43 mg/dL — ABNORMAL HIGH (ref 8–23)
Calcium, Ion: 1.06 mmol/L — ABNORMAL LOW (ref 1.15–1.40)
Chloride: 92 mmol/L — ABNORMAL LOW (ref 98–111)
Creatinine, Ser: 8.2 mg/dL — ABNORMAL HIGH (ref 0.44–1.00)
Glucose, Bld: 91 mg/dL (ref 70–99)
HCT: 36 % (ref 36.0–46.0)
Hemoglobin: 12.2 g/dL (ref 12.0–15.0)
Potassium: 4.3 mmol/L (ref 3.5–5.1)
Sodium: 135 mmol/L (ref 135–145)
TCO2: 31 mmol/L (ref 22–32)

## 2024-04-07 MED ORDER — CYCLOBENZAPRINE HCL 5 MG PO TABS: 5.0000 mg | ORAL_TABLET | Freq: Every day | ORAL | 0 refills | Status: AC

## 2024-04-07 MED ORDER — PROCHLORPERAZINE EDISYLATE 10 MG/2ML IJ SOLN
10.0000 mg | Freq: Once | INTRAMUSCULAR | Status: AC
  Administered 2024-04-07: 10 mg via INTRAVENOUS
  Filled 2024-04-07: qty 2

## 2024-04-07 MED ORDER — OXYCODONE-ACETAMINOPHEN 5-325 MG PO TABS: 1.0000 | ORAL_TABLET | Freq: Once | ORAL | Status: DC

## 2024-04-07 MED ORDER — DIPHENHYDRAMINE HCL 50 MG/ML IJ SOLN
12.5000 mg | Freq: Once | INTRAMUSCULAR | Status: AC
  Administered 2024-04-07: 12.5 mg via INTRAVENOUS
  Filled 2024-04-07: qty 1

## 2024-04-07 NOTE — ED Provider Triage Note (Addendum)
 Emergency Medicine Provider Triage Evaluation Note  Gloria Gallagher , a 73 y.o. female  was evaluated in triage.  Pt complains of headache.  Had recent C1/C2 fusion with Dr. Louis 03/23/24.  Headache all day today.  Worse if she moves her head.  She has been wearing cervical collar.  Currently back in her eliquis .  Denies numbness, weakness, nausea/vomiting.  Review of Systems  Positive: headache Negative: fever  Physical Exam  BP (!) 140/69   Pulse 71   Temp 97.7 F (36.5 C) (Oral)   Resp 20   Ht 5' 3 (1.6 m)   Wt 66.2 kg   SpO2 100%   BMI 25.86 kg/m   Gen:   Awake, no distress   Resp:  Normal effort  MSK:   Moves extremities without difficulty  Other:  Wearing soft cervical collar, holding her head, answering questions appropriately, moving extremities without focal deficit  Medical Decision Making  Medically screening exam initiated at 12:34 AM.  Appropriate orders placed.  [name] was informed that the remainder of the evaluation will be completed by another provider, this initial triage assessment does not replace that evaluation, and the importance of remaining in the ED until their evaluation is complete.  Headache.  Recently post-op, back on eliquis .  AAOx3 here, moving extremities.  Will obtain labs, CT head/neck.   Jarold Olam HERO, PA-C 04/07/24 0037    Jarold Olam HERO, PA-C 04/07/24 [phone]

## 2024-04-07 NOTE — ED Triage Notes (Signed)
 Pt BIB [facility] from home c/o headache. Pt states headache started around noon on 11/24. Pt had surgery on neck from a fall on 11/3. Pts doctor advised to come to ED if she develops a headache. Pt takes Eliquis  but was paused from 11/3-11/18. Pt has L sided fistula.

## 2024-04-07 NOTE — ED Notes (Signed)
 Pt was given discharge instructions and verbalized understanding. Opportunity was given to ask questions.

## 2024-04-07 NOTE — ED Provider Notes (Signed)
 [place] EMERGENCY DEPARTMENT AT [facility] Provider Note   CSN: [id number] Arrival date & time: 04/07/24  0007     Patient presents with: Headache   Gloria Gallagher is a 73 y.o. female.   HPI     This is a 73 year old female with a history of end-stage renal disease and recent history of recurrent odontoid fracture and posterior cervical fusion who presents with headache.  Recently had vision by Dr. Malcolm on 11/10.  Reports headache.  Mostly headache on the right side behind her right eye.  It is worse with movement of her neck.  She cannot find a comfortable position.  She has been wearing a soft collar.  Denies any nausea, vomiting, vision changes, weakness, numbness, tingling.  Took Tylenol  with minimal relief.  Prior to Admission medications   Medication Sig Start Date End Date Taking? Authorizing Provider  cyclobenzaprine  (FLEXERIL ) 5 MG tablet Take 1 tablet (5 mg total) by mouth at bedtime. 04/07/24  Yes Caden Fatica, Charmaine FALCON, MD  acetaminophen  (TYLENOL ) 500 MG tablet Take 2 tablets (1,000 mg total) by mouth every 8 (eight) hours as needed for headache, fever, mild pain (pain score 1-3) or moderate pain (pain score 4-6). 03/28/24   Hongalgi, Anand D, MD  amiodarone  (PACERONE ) 200 MG tablet TAKE 1 TABLET BY MOUTH DAILY 11/14/23   Nahser, Aleene PARAS, MD  apixaban  (ELIQUIS ) 5 MG TABS tablet TAKE 1 TABLET BY MOUTH TWICE  DAILY 11/14/23   Nahser, Aleene PARAS, MD  atorvastatin  (LIPITOR) 20 MG tablet Take 20 mg by mouth every evening. 12/05/21   [provider]  cilostazol  (PLETAL ) 50 MG tablet Take 1 tablet (50 mg total) by mouth 2 (two) times daily. 07/02/23   Gerome Maurilio HERO, PA-C  cinacalcet  (SENSIPAR ) 90 MG tablet Take 90 mg by mouth every evening.    [provider]  Doxercalciferol  (HECTOROL  IV) Inject 4 mcg into the vein every Monday, Wednesday, and Friday with hemodialysis. Received at and provided by dialysis clinic.    [provider]  ferric  citrate (AURYXIA ) 1 GM 210 MG(Fe) tablet Take 1 tablet (210 mg total) by mouth 3 (three) times daily with meals. Patient taking differently: Take 210-420 mg by mouth See admin instructions. Take 2 tablets (420mg ) by mouth three times daily with meals and take 1 tablet (210mg ) with snacks. 01/19/22   Bryan Bianchi, MD  multivitamin (RENA-VIT) TABS tablet Take 1 tablet by mouth at bedtime. Patient taking differently: Take 1 tablet by mouth daily. 10/05/22   Bryan Bianchi, MD  ondansetron  (ZOFRAN -ODT) 4 MG disintegrating tablet Take 1 tablet (4 mg total) by mouth every 8 (eight) hours as needed for nausea or vomiting. 03/28/24   Hongalgi, Trenda BIRCH, MD    Allergies: Penicillins and Shrimp (diagnostic)    Review of Systems  Constitutional:  Negative for fever.  Respiratory:  Negative for shortness of breath.   Cardiovascular:  Negative for chest pain.  Musculoskeletal:  Positive for neck pain.  Neurological:  Positive for headaches. Negative for weakness.  All other systems reviewed and are negative.   Updated Vital Signs BP (!) 148/64   Pulse 72   Temp 98.2 F (36.8 C) (Oral)   Resp 18   Ht 1.6 m (5' 3)   Wt 66.2 kg   SpO2 98%   BMI 25.86 kg/m   Physical Exam Vitals and nursing note reviewed.  Constitutional:      Appearance: She is well-developed.     Comments: Notably, chronically  ill-appearing  HENT:     Head: Normocephalic.  Eyes:     Pupils: Pupils are equal, round, and reactive to light.  Neck:     Comments: Soft collar in place, appears to hold her head slightly rotated to the right as a position of comfort, posterior incision is clean dry and intact, no adjacent erythema Cardiovascular:     Rate and Rhythm: Normal rate and regular rhythm.     Heart sounds: Normal heart sounds.  Pulmonary:     Effort: Pulmonary effort is normal. No respiratory distress.     Breath sounds: No wheezing.  Abdominal:     Palpations: Abdomen is soft.  Skin:    General: Skin is warm and  dry.  Neurological:     Mental Status: She is alert and oriented to person, place, and time.     Comments: Fluent speech, cranial nerves II through XII intact, 5 out of 5 strength in all 4 extremities     (all labs ordered are listed, but only abnormal results are displayed) Labs Reviewed  CBC WITH DIFFERENTIAL/PLATELET - Abnormal; Notable for the following components:      Result Value   RBC 3.69 (*)    Hemoglobin 11.3 (*)    HCT 34.3 (*)    RDW 16.8 (*)    All other components within normal limits  BASIC METABOLIC PANEL WITH GFR - Abnormal; Notable for the following components:   Sodium 134 (*)    Chloride 90 (*)    BUN 39 (*)    Creatinine, Ser 7.74 (*)    Calcium  8.7 (*)    GFR, Estimated 5 (*)    Anion gap 18 (*)    All other components within normal limits  I-STAT CHEM 8, ED - Abnormal; Notable for the following components:   Chloride 92 (*)    BUN 43 (*)    Creatinine, Ser 8.20 (*)    Calcium , Ion 1.06 (*)    All other components within normal limits    EKG: None  Radiology: CT Cervical Spine Wo Contrast Result Date: 04/07/2024 EXAM: CT CERVICAL SPINE WITHOUT CONTRAST 04/07/2024 01:30:40 AM TECHNIQUE: CT of the cervical spine was performed without the administration of intravenous contrast. Multiplanar reformatted images are provided for review. Automated exposure control, iterative reconstruction, and/or weight based adjustment of the mA/kV was utilized to reduce the radiation dose to as low as reasonably achievable. COMPARISON: 03/16/2024 CLINICAL HISTORY: Neck pain. FINDINGS: CERVICAL SPINE: BONES AND ALIGNMENT: 7 cervical segments are well visualized. A chronic odontoid fracture is noted at the base. The odontoid fragment is less displaced than that seen on the prior exam, consistent with the recent C1-C2 fusion. The C1 fixation screw on the left does not appear to extend into the lateral mass at C1 but courses medially adjacent to this; this does not appear to  traverse the spinal sac. The fixation screw at C2 on the left is in the lateral aspect of the lamina; this does not appear to extend into the pedicle on the left. No acute fracture is noted. No facet abnormality is seen. DEGENERATIVE CHANGES: No significant degenerative changes. SOFT TISSUES: Surrounding soft tissue structures are within normal limits. LUNG APICES: Visualized lung apices show diffuse emphysematous changes. IMPRESSION: 1. 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 2. The fixation screws at C1 on the left, it appears medial to its expected location within the lateral mass, the  C2 fixation screw does not appear to extend into the facet on the left. Correlate with operative findings. 3. No acute fracture or facet abnormality. Electronically signed by: Oneil Devonshire MD 04/07/2024 01:41 AM EST RP Workstation: [id number]   CT Head Wo Contrast Result Date: 04/07/2024 EXAM: CT HEAD WITHOUT CONTRAST 04/07/2024 01:30:40 AM TECHNIQUE: CT of the head was performed without the administration of intravenous contrast. Automated exposure control, iterative reconstruction, and/or weight based adjustment of the mA/kV was utilized to reduce the radiation dose to as low as reasonably achievable. COMPARISON: 03/16/2024 CLINICAL HISTORY: New onset headaches. FINDINGS: BRAIN AND VENTRICLES: Changes consistent with chronic white matter ischemic changes are again noted. Atrophic changes are seen as well. 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 No acute hemorrhage or acute infarction is seen. ORBITS: No acute abnormality. SINUSES: Stable partial right mastoid effusion is noted. SOFT TISSUES AND SKULL: Postsurgical changes in the upper cervical spine and skull base are noted. No skull fracture. IMPRESSION: 1. Chronic changes without acute abnormality. Electronically signed by: Oneil Devonshire MD 04/07/2024 01:35 AM EST RP Workstation:  [id number]     Procedures   Medications Ordered in the ED  prochlorperazine  (COMPAZINE ) injection 10 mg (10 mg Intravenous Given 04/07/24 0607)  diphenhydrAMINE  (BENADRYL ) injection 12.5 mg (12.5 mg Intravenous Given 04/07/24 0608)    Clinical Course as of 04/07/24 0657  Tue Apr 07, 2024  0649 Spoke with Jennetta, neurosurgery.  She had Dr. Mavis review patient's CT imaging.  While suboptimal placement of screws, do not feel this is likely contributing to headache.  She has follow-up next week for the patient.  She will follow-up then.  Patient was given a migraine cocktail.  This seemed to improve her symptoms.  She still complains of some pain when she moves her neck.  Will add a muscle relaxer. [CH]  0650 On recheck, patient improved after migraine cocktail.  Will discharge home. [CH]    Clinical Course User Index [CH] Laurel Smeltz, Charmaine FALCON, MD                                 Medical Decision Making Risk Prescription drug management.   This patient presents to the ED for concern of headache, this involves an extensive number of treatment options, and is a complaint that carries with it a high risk of complications and morbidity.  I considered the following differential and admission for this acute, potentially life threatening condition.  The differential diagnosis includes tension headache, migraine, headache related to recent surgery, infection, meningitis  MDM:    This is a 73 year old female who presents with headache.  Describes a right sided facial headache.  It is worse with certain movements.  No red flags.  No neurologic deficits.  Her incision is clean dry and intact.  No adjacent erythema.  Labs obtained.  No leukocytosis.  Creatinine elevated to its baseline.  CT without any obvious bleed.  CT cervical spine shows some questionable placement of screws in C1 and C2 on the left.  Discussed with neurosurgery.  They do not believe this is likely associated with the patient's  current symptoms.  Recommend follow-up in office.  She has follow-up next week.  Patient was given a migraine cocktail and had improvement of symptoms.  She still is reporting some symptoms with range of motion of the neck.  Will add a muscle relaxer to her Tylenol  at home.  (Labs, imaging, consults)  Labs: I Ordered, and personally interpreted labs.  The pertinent results include: CBC, BMP  Imaging Studies ordered: I ordered imaging studies including CT head, cervical spine I independently visualized and interpreted imaging. I agree with the radiologist interpretation  Additional history obtained from chart review.  External records from outside source obtained and reviewed including operative notes  Cardiac Monitoring: The patient was maintained on a cardiac monitor.  If on the cardiac monitor, I personally viewed and interpreted the cardiac monitored which showed an underlying rhythm of: Sinus  Reevaluation: After the interventions noted above, I reevaluated the patient and found that they have :improved  Social Determinants of Health:  lives independently  Disposition: Discharge  Co morbidities that complicate the patient evaluation  Past Medical History:  Diagnosis Date   Acute hypoxic respiratory failure (HCC) 10/01/2022   Acute respiratory failure (HCC) 10/01/2022   Anemia    Aortic atherosclerosis    Arthritis    Atrial fibrillation (HCC)    CAD (coronary artery disease)    COVID 2021   mild   ESRD on hemodialysis (HCC)    MWF at [facility]   GERD (gastroesophageal reflux disease)    Gout    Heart murmur    Hyperparathyroidism    Hypertension    in the past, no longer takes medications   Neuromuscular disorder (HCC)    neuropathy   Pneumonia    Seasonal allergies      Medicines Meds ordered this encounter  Medications   DISCONTD: oxyCODONE -acetaminophen  (PERCOCET/ROXICET) 5-325 MG per tablet 1 tablet    Refill:  0   prochlorperazine  (COMPAZINE ) injection  10 mg   diphenhydrAMINE  (BENADRYL ) injection 12.5 mg   cyclobenzaprine  (FLEXERIL ) 5 MG tablet    Sig: Take 1 tablet (5 mg total) by mouth at bedtime.    Dispense:  10 tablet    Refill:  0    I have reviewed the patients home medicines and have made adjustments as needed  Problem List / ED Course: Problem List Items Addressed This Visit   None Visit Diagnoses       Bad headache    -  Primary   Relevant Medications   cyclobenzaprine  (FLEXERIL ) 5 MG tablet     Neck pain                    Final diagnoses:  Bad headache  Neck pain    ED Discharge Orders          Ordered    cyclobenzaprine  (FLEXERIL ) 5 MG tablet  Daily at bedtime        04/07/24 0651               Bari Charmaine FALCON, MD 04/07/24 [phone]

## 2024-04-07 NOTE — Discharge Instructions (Signed)
 You were seen today for a headache.  You had imaging that was reassuring.  Some of this may be related to muscle spasm related to your recent surgery.  Continue Tylenol  at home.  You may take Flexeril .  This medicine can sometimes make you sleepy or dizzy.  Do not drive or operate heavy machinery.  Follow-up with Dr. Louis.

## 2024-04-11 ENCOUNTER — Encounter (HOSPITAL_COMMUNITY): Payer: Self-pay

## 2024-04-11 ENCOUNTER — Emergency Department (HOSPITAL_COMMUNITY)
Admission: EM | Admit: 2024-04-11 | Discharge: 2024-04-11 | Disposition: A | Discharge status: Home / Self Care | Attending: Emergency Medicine | Admitting: Emergency Medicine

## 2024-04-11 ENCOUNTER — Emergency Department (HOSPITAL_COMMUNITY)

## 2024-04-11 ENCOUNTER — Other Ambulatory Visit: Payer: Self-pay

## 2024-04-11 DIAGNOSIS — N186 End stage renal disease: Secondary | ICD-10-CM | POA: Insufficient documentation

## 2024-04-11 DIAGNOSIS — Z8616 Personal history of COVID-19: Secondary | ICD-10-CM | POA: Insufficient documentation

## 2024-04-11 DIAGNOSIS — S060X0A Concussion without loss of consciousness, initial encounter: Secondary | ICD-10-CM | POA: Insufficient documentation

## 2024-04-11 DIAGNOSIS — Z992 Dependence on renal dialysis: Secondary | ICD-10-CM | POA: Diagnosis not present

## 2024-04-11 DIAGNOSIS — W01198A Fall on same level from slipping, tripping and stumbling with subsequent striking against other object, initial encounter: Secondary | ICD-10-CM | POA: Insufficient documentation

## 2024-04-11 DIAGNOSIS — Z7901 Long term (current) use of anticoagulants: Secondary | ICD-10-CM | POA: Diagnosis not present

## 2024-04-11 DIAGNOSIS — S161XXA Strain of muscle, fascia and tendon at neck level, initial encounter: Secondary | ICD-10-CM | POA: Insufficient documentation

## 2024-04-11 DIAGNOSIS — I120 Hypertensive chronic kidney disease with stage 5 chronic kidney disease or end stage renal disease: Secondary | ICD-10-CM | POA: Diagnosis not present

## 2024-04-11 DIAGNOSIS — S0990XA Unspecified injury of head, initial encounter: Secondary | ICD-10-CM | POA: Diagnosis present

## 2024-04-11 DIAGNOSIS — W19XXXA Unspecified fall, initial encounter: Secondary | ICD-10-CM

## 2024-04-11 DIAGNOSIS — I2510 Atherosclerotic heart disease of native coronary artery without angina pectoris: Secondary | ICD-10-CM | POA: Diagnosis not present

## 2024-04-11 LAB — CBC WITH DIFFERENTIAL/PLATELET
Abs Immature Granulocytes: 0.03 K/uL (ref 0.00–0.07)
Basophils Absolute: 0 K/uL (ref 0.0–0.1)
Basophils Relative: 1 %
Eosinophils Absolute: 0 K/uL (ref 0.0–0.5)
Eosinophils Relative: 0 %
HCT: 39.8 % (ref 36.0–46.0)
Hemoglobin: 12.2 g/dL (ref 12.0–15.0)
Immature Granulocytes: 1 %
Lymphocytes Relative: 11 %
Lymphs Abs: 0.6 K/uL — ABNORMAL LOW (ref 0.7–4.0)
MCH: 30.7 pg (ref 26.0–34.0)
MCHC: 30.7 g/dL (ref 30.0–36.0)
MCV: 100.3 fL — ABNORMAL HIGH (ref 80.0–100.0)
Monocytes Absolute: 0.5 K/uL (ref 0.1–1.0)
Monocytes Relative: 9 %
Neutro Abs: 4 K/uL (ref 1.7–7.7)
Neutrophils Relative %: 78 %
Platelets: 146 K/uL — ABNORMAL LOW (ref 150–400)
RBC: 3.97 MIL/uL (ref 3.87–5.11)
RDW: 16.9 % — ABNORMAL HIGH (ref 11.5–15.5)
WBC: 5.1 K/uL (ref 4.0–10.5)
nRBC: 0 % (ref 0.0–0.2)

## 2024-04-11 LAB — BASIC METABOLIC PANEL WITH GFR
Anion gap: 17 — ABNORMAL HIGH (ref 5–15)
BUN: 23 mg/dL (ref 8–23)
CO2: 26 mmol/L (ref 22–32)
Calcium: 9.2 mg/dL (ref 8.9–10.3)
Chloride: 92 mmol/L — ABNORMAL LOW (ref 98–111)
Creatinine, Ser: 5.63 mg/dL — ABNORMAL HIGH (ref 0.44–1.00)
GFR, Estimated: 7 mL/min — ABNORMAL LOW (ref >60)
Glucose, Bld: 109 mg/dL — ABNORMAL HIGH (ref 70–99)
Potassium: 3.6 mmol/L (ref 3.5–5.1)
Sodium: 135 mmol/L (ref 135–145)

## 2024-04-11 MED ORDER — ACETAMINOPHEN 325 MG PO TABS
650.0000 mg | ORAL_TABLET | Freq: Once | ORAL | Status: AC
  Administered 2024-04-11: 650 mg via ORAL
  Filled 2024-04-11: qty 2

## 2024-04-11 NOTE — ED Provider Notes (Signed)
 [place] EMERGENCY DEPARTMENT AT [facility] Provider Note   CSN: [id number] Arrival date & time: 04/11/24  0130     Patient presents with: Fall (On thinners)   Gloria Gallagher is a 73 y.o. female.   The history is provided by the patient.  Patient with history of multiple chronic comorbidities including A-fib on Eliquis , CAD, end-stage renal disease on dialysis (last session was 11/28) hypertension presents after mechanical fall.  Patient reports she was changing positions in a chair when she fell.  She suspects that she hit her head.  She is on anticoagulation.  Reports neck pain though and had recent surgery.  No new weakness.  Patient reports she has assistance at home and typically uses a wheelchair She denies any new weakness   Patient with history of chronic dens fracture, on November 10 patient had cervical fusion Past Medical History:  Diagnosis Date   Acute hypoxic respiratory failure (HCC) 10/01/2022   Acute respiratory failure (HCC) 10/01/2022   Anemia    Aortic atherosclerosis    Arthritis    Atrial fibrillation (HCC)    CAD (coronary artery disease)    COVID 2021   mild   ESRD on hemodialysis (HCC)    MWF at [facility]   GERD (gastroesophageal reflux disease)    Gout    Heart murmur    Hyperparathyroidism    Hypertension    in the past, no longer takes medications   Neuromuscular disorder (HCC)    neuropathy   Pneumonia    Seasonal allergies     Prior to Admission medications   Medication Sig Start Date End Date Taking? Authorizing Provider  acetaminophen  (TYLENOL ) 500 MG tablet Take 2 tablets (1,000 mg total) by mouth every 8 (eight) hours as needed for headache, fever, mild pain (pain score 1-3) or moderate pain (pain score 4-6). 03/28/24   Hongalgi, Anand D, MD  amiodarone  (PACERONE ) 200 MG tablet TAKE 1 TABLET BY MOUTH DAILY 11/14/23   Nahser, Aleene PARAS, MD  apixaban  (ELIQUIS ) 5 MG TABS tablet TAKE 1 TABLET BY MOUTH TWICE  DAILY 11/14/23    Nahser, Aleene PARAS, MD  atorvastatin  (LIPITOR) 20 MG tablet Take 20 mg by mouth every evening. 12/05/21   [provider]  cilostazol  (PLETAL ) 50 MG tablet Take 1 tablet (50 mg total) by mouth 2 (two) times daily. 07/02/23   Gerome Maurilio HERO, PA-C  cinacalcet  (SENSIPAR ) 90 MG tablet Take 90 mg by mouth every evening.    [provider]  cyclobenzaprine  (FLEXERIL ) 5 MG tablet Take 1 tablet (5 mg total) by mouth at bedtime. 04/07/24   Bari Charmaine FALCON, MD  Doxercalciferol  (HECTOROL  IV) Inject 4 mcg into the vein every Monday, Wednesday, and Friday with hemodialysis. Received at and provided by dialysis clinic.    [provider]  ferric citrate  (AURYXIA ) 1 GM 210 MG(Fe) tablet Take 1 tablet (210 mg total) by mouth 3 (three) times daily with meals. Patient taking differently: Take 210-420 mg by mouth See admin instructions. Take 2 tablets (420mg ) by mouth three times daily with meals and take 1 tablet (210mg ) with snacks. 01/19/22   Bryan Bianchi, MD  multivitamin (RENA-VIT) TABS tablet Take 1 tablet by mouth at bedtime. Patient taking differently: Take 1 tablet by mouth daily. 10/05/22   Bryan Bianchi, MD  ondansetron  (ZOFRAN -ODT) 4 MG disintegrating tablet Take 1 tablet (4 mg total) by mouth every 8 (eight) hours as needed for nausea or vomiting. 03/28/24   Hongalgi, Trenda  D, MD    Allergies: Penicillins and Shrimp (diagnostic)    Review of Systems  Constitutional:  Negative for fever.  Cardiovascular:  Negative for chest pain.  Gastrointestinal:  Negative for abdominal pain and vomiting.  Musculoskeletal:  Positive for neck pain.    Updated Vital Signs BP (!) 150/71   Pulse 84   Temp 98.7 F (37.1 C) (Oral)   Resp 20   Ht 1.6 m (5' 2.99)   Wt 66.2 kg   SpO2 93%   BMI 25.87 kg/m   Physical Exam CONSTITUTIONAL: Elderly, chronically ill-appearing HEAD: Normocephalic/atraumatic no tenderness to scalp EYES: EOMI/PERRL ENMT: Mucous membranes moist, no facial  trauma NECK: Wearing a neck brace SPINE/BACK: Diffuse cervical spine tenderness.  Healing incision is noted, is clean dry and intact and no erythema No thoracic or lumbar tenderness CV: S1/S2 noted, no murmurs/rubs/gallops noted LUNGS: Lungs are clear to auscultation bilaterally, no apparent distress Chest-no tenderness or bruising ABDOMEN: soft, nontender NEURO: Pt is awake/alert/appropriate, moves all extremitiesx4.  No arm weakness.  Able move both legs but limited due to chronic debility EXTREMITIES: pulses normal/equal, full ROM Pelvis stable All other extremities/joints palpated/ranged and nontender HD graft in left UE with thrill noted SKIN: warm, color normal PSYCH: no abnormalities of mood noted, alert and oriented to situation  (all labs ordered are listed, but only abnormal results are displayed) Labs Reviewed  CBC WITH DIFFERENTIAL/PLATELET - Abnormal; Notable for the following components:      Result Value   MCV 100.3 (*)    RDW 16.9 (*)    Platelets 146 (*)    Lymphs Abs 0.6 (*)    All other components within normal limits  BASIC METABOLIC PANEL WITH GFR - Abnormal; Notable for the following components:   Chloride 92 (*)    Glucose, Bld 109 (*)    Creatinine, Ser 5.63 (*)    GFR, Estimated 7 (*)    Anion gap 17 (*)    All other components within normal limits    EKG: EKG Interpretation Date/Time:  Saturday April 11 2024 01:53:08 EST Ventricular Rate:  90 PR Interval:    QRS Duration:  110 QT Interval:  434 QTC Calculation: 532 R Axis:   -20  Text Interpretation: Atrial fibrillation Left ventricular hypertrophy Borderline T abnormalities, lateral leads Prolonged QT interval Confirmed by Midge Golas ([id number]) on 04/11/2024 1:57:03 AM  Radiology: CT HEAD WO CONTRAST Result Date: 04/11/2024 EXAM: CT HEAD AND CERVICAL SPINE 04/11/2024 02:06:17 AM TECHNIQUE: CT of the head and cervical spine was performed without the administration of intravenous  contrast. Multiplanar reformatted images are provided for review. Automated exposure control, iterative reconstruction, and/or weight based adjustment of the mA/kV was utilized to reduce the radiation dose to as low as reasonably achievable. COMPARISON: Cervical spine CT and head CT both 04/07/2024. CLINICAL HISTORY: Trauma, fall on blood thinners, head and neck trauma, and recent history of posterior cervical fusion and foraminotomy at C1-C2 on 03/23/2024. FINDINGS: CT HEAD BRAIN AND VENTRICLES: No acute intracranial hemorrhage. No mass effect or midline shift. No abnormal extra-axial fluid collection. No evidence of acute infarct. No hydrocephalus. There is mild atrophy and atrophic ventriculomegaly with moderate to severe small vessel disease of the cerebral white matter and chronic thalamic lacunar infarcts. ORBITS: There is increased fat in both orbits, bilateral chronic proptosis and no extraocular muscle thickening. SINUSES AND MASTOIDS: There is patchy fluid in the right greater than left mastoid air cells with both middle ears, paranasal sinuses clear. SOFT  TISSUES AND SKULL: No acute skull fracture. No acute soft tissue abnormality. No scalp hematoma or depressed skull fractures. VASCULATURE: Both carotid siphons are heavily calcified with calcification in the distal vertebral arteries. There is no hyperdense vessel. 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 The spinal canal is narrowed to 7.5 mm AP as before at this level, with the ventral cord flattened by the body of C2 at the level of the old fracture. There are postsurgical changes with bilateral posterior fusion rods and fixation screws at C1-C2. The right-sided fixation screws are well anchored in bone, but on the left, the C1 screw courses between the thecal sac and the medial edge of the  C1 lateral mass, whereas the C2 screw is in the lateral aspect of the lamina rather than in the pedicle. This was seen previously and unchanged, as well as cerclage wiring encircling the dorsal C1 arch and C2 spinous process with an intervening horizontally oriented short bone graft. There is air in the medullary space of the bone graft, decreased since the prior study, but etiology indeterminate. No new fracture is seen. The C1 dorsal arch is bilaterally fractured at the fixation screws with gapping on the left greater than right, and there is a fracture in the right anterolateral C1 arch with 4 mm distraction, all of which were seen 4 days ago, with no antecedent trauma reported prior to that study. The rest of the cervical spine is osteopenic and intact, with otherwise normal alignment. DEGENERATIVE CHANGES: The cervical discs are normal in height. There are mild posterior disc bulges in the cervical spine, but no herniated discs. Osseous foramina are widely patent and there is only slight facet spurring. SOFT TISSUES: No prevertebral soft tissue swelling. No spinal canal hematoma. No laryngeal or thyroid  mass. VASCULATURE: There is atherosclerosis in the great vessels, high carotid bifurcations at C2-C3 and heavy calcification in both proximal cervical ICAs. LUNG BASES: The lung bases are emphysematous but clear. IMPRESSION: 1. No acute intracranial CT findings or depressed skull fractures. Chronic change. 2. 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 3. Postsurgical changes at C1-2 with bilateral posterior fusion hardware, with suboptimal left-sided screw placements, unchanged. . 4. Unchanged apparently postsurgical fractures of the bilateral C1 dorsal arch at the fixation screws, and of the right anterolateral C1 arch. 5. Bone graft fragment with  air in the medullary space.  Electronically signed by: Francis Quam MD 04/11/2024 03:24 AM EST RP Workstation: [id number]   CT CERVICAL SPINE WO CONTRAST Result Date: 04/11/2024 EXAM: CT HEAD AND CERVICAL SPINE 04/11/2024 02:06:17 AM TECHNIQUE: CT of the head and cervical spine was performed without the administration of intravenous contrast. Multiplanar reformatted images are provided for review. Automated exposure control, iterative reconstruction, and/or weight based adjustment of the mA/kV was utilized to reduce the radiation dose to as low as reasonably achievable. COMPARISON: Cervical spine CT and head CT both 04/07/2024. CLINICAL HISTORY: Trauma, fall on blood thinners, head and neck trauma, and recent history of posterior cervical fusion and foraminotomy at C1-C2 on 03/23/2024. FINDINGS: CT HEAD BRAIN AND VENTRICLES: No acute intracranial hemorrhage. No mass effect or midline shift. No abnormal extra-axial fluid collection. No evidence of acute infarct. No hydrocephalus. There  is mild atrophy and atrophic ventriculomegaly with moderate to severe small vessel disease of the cerebral white matter and chronic thalamic lacunar infarcts. ORBITS: There is increased fat in both orbits, bilateral chronic proptosis and no extraocular muscle thickening. SINUSES AND MASTOIDS: There is patchy fluid in the right greater than left mastoid air cells with both middle ears, paranasal sinuses clear. SOFT TISSUES AND SKULL: No acute skull fracture. No acute soft tissue abnormality. No scalp hematoma or depressed skull fractures. VASCULATURE: Both carotid siphons are heavily calcified with calcification in the distal vertebral arteries. There is no hyperdense vessel. 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 The spinal canal is narrowed to 7.5 mm AP as before at this level, with the ventral  cord flattened by the body of C2 at the level of the old fracture. There are postsurgical changes with bilateral posterior fusion rods and fixation screws at C1-C2. The right-sided fixation screws are well anchored in bone, but on the left, the C1 screw courses between the thecal sac and the medial edge of the C1 lateral mass, whereas the C2 screw is in the lateral aspect of the lamina rather than in the pedicle. This was seen previously and unchanged, as well as cerclage wiring encircling the dorsal C1 arch and C2 spinous process with an intervening horizontally oriented short bone graft. There is air in the medullary space of the bone graft, decreased since the prior study, but etiology indeterminate. No new fracture is seen. The C1 dorsal arch is bilaterally fractured at the fixation screws with gapping on the left greater than right, and there is a fracture in the right anterolateral C1 arch with 4 mm distraction, all of which were seen 4 days ago, with no antecedent trauma reported prior to that study. The rest of the cervical spine is osteopenic and intact, with otherwise normal alignment. DEGENERATIVE CHANGES: The cervical discs are normal in height. There are mild posterior disc bulges in the cervical spine, but no herniated discs. Osseous foramina are widely patent and there is only slight facet spurring. SOFT TISSUES: No prevertebral soft tissue swelling. No spinal canal hematoma. No laryngeal or thyroid  mass. VASCULATURE: There is atherosclerosis in the great vessels, high carotid bifurcations at C2-C3 and heavy calcification in both proximal cervical ICAs. LUNG BASES: The lung bases are emphysematous but clear. IMPRESSION: 1. No acute intracranial CT findings or depressed skull fractures. Chronic change. 2. 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 3. Postsurgical  changes at C1-2 with bilateral posterior fusion hardware, with suboptimal left-sided screw placements, unchanged. . 4. Unchanged apparently postsurgical fractures of the bilateral C1 dorsal arch at the fixation screws, and of the right anterolateral C1 arch. 5. Bone graft fragment with  air in the medullary space. Electronically signed by: Francis Quam MD 04/11/2024 03:24 AM EST RP Workstation: [id number]   DG Chest Port 1 View Result Date: 04/11/2024 EXAM: 1 VIEW(S) XRAY OF THE CHEST 04/11/2024 01:51:00 AM COMPARISON: 03/16/2024 CLINICAL HISTORY: Trauma FINDINGS: PULMONARY VASCULAR CONGESTION. 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 No pleural effusion. No pneumothorax. HEART AND MEDIASTINUM: Unchanged cardiomegaly. Tortuous thoracic aorta. BONES AND SOFT TISSUES: Unchanged sclerotic projecting over the left scapula. IMPRESSION: 1. No acute  cardiopulmonary process identified. 2. Cardiomegaly and pulmonary vascular congestion. Electronically signed by: Norman Gatlin MD 04/11/2024 01:57 AM EST RP Workstation: [id number]     Procedures   Medications Ordered in the ED  acetaminophen  (TYLENOL ) tablet 650 mg (650 mg Oral Given 04/11/24 0322)    Clinical Course as of 04/11/24 0338  Sat Apr 11, 2024  0211 Patient seen on arrival, now reporting she hit her head and is on Eliquis , she was upgraded to a level 2 trauma She arrives wearing her own personal cervical collar from recent C-spine surgery.  Will keep this in place [DW]  0337 Patient stable, no acute distress, she feels well. CT head and C-spine overall unremarkable, multiple chronic findings on CT C-spine but no acute change  Patient feels comfortable going home.  She lives with her sister, but would appreciate home health and I will order this [DW]    Clinical Course User Index [DW] Midge Golas, MD                                 Medical Decision Making Amount and/or Complexity of Data  Reviewed Labs: ordered. Radiology: ordered.  Risk OTC drugs.   This patient presents to the ED for concern of fall and head injury, this involves an extensive number of treatment options, and is a complaint that carries with it a high risk of complications and morbidity.  The differential diagnosis includes but is not limited to subdural hematoma, subarachnoid hemorrhage, skull fracture, concussion Cervical spine fracture  Comorbidities that complicate the patient evaluation: Patient's presentation is complicated by their history of end-stage renal disease, hypertension, recent C-spine surgery  Social Determinants of Health: Patient's poor mobility  increases the complexity of managing their presentation  Additional history obtained: Records reviewed previous admission documents  Lab Tests: I Ordered, and personally interpreted labs.  The pertinent results include: End-stage renal disease  Imaging Studies ordered: I ordered imaging studies including CT scan head and C-spine and X-ray chest  I independently visualized and interpreted imaging which showed no acute traumatic injuries I agree with the radiologist interpretation  Cardiac Monitoring: The patient was maintained on a cardiac monitor.  I personally viewed and interpreted the cardiac monitor which showed an underlying rhythm of:  Atrial Fibrillation  Medicines ordered and prescription drug management: I ordered medication including Tylenol  for pain Reevaluation of the patient after these medicines showed that the patient    improved  Reevaluation: After the interventions noted above, I reevaluated the patient and found that they have :improved  Complexity of problems addressed: Patient's presentation is most consistent with  acute presentation with potential threat to life or bodily function  Disposition: After consideration of the diagnostic results and the patient's response to treatment,  I feel that the patent  would benefit from discharge  .    Patient just had dialysis, she has no emergent indications for dialysis at this time    Final diagnoses:  Fall, initial encounter  Concussion without loss of consciousness, initial encounter  Strain of neck muscle, initial encounter    ED Discharge Orders     None          Midge Golas, MD 04/11/24 [id number]

## 2024-04-11 NOTE — ED Notes (Signed)
 Pt sister here, wheeled out to lobby with discharge paperwork and belongings.

## 2024-04-11 NOTE — ED Notes (Signed)
 Pt waiting for sister to arrive to take pt home. Pt is dressed in clothes and laying in bed until ride here, still reports head pain especially when moving too much.

## 2024-04-11 NOTE — Progress Notes (Signed)
 Chaplain encounter Pt  being escorted by the medical team to CT Scan for further evaluation, no family was present at the time of visit.

## 2024-04-11 NOTE — ED Notes (Signed)
 CCMD called, pt on monitor

## 2024-04-11 NOTE — ED Triage Notes (Signed)
 Pt reports fall on thinners at home, reports feet slipped from underneath her and hit tile. Initally reports hitting head and then changed story en route and states she did hit head. Pt denies any head pain. Only complaining of R shoulder pain Spine surgery 2wks ago, dialysis MWF,  [facility] 15  156/168 22 95% RA

## 2024-04-13 ENCOUNTER — Emergency Department (HOSPITAL_COMMUNITY)
Admission: EM | Admit: 2024-04-13 | Discharge: 2024-04-13 | Disposition: A | Discharge status: Home / Self Care | Attending: Emergency Medicine | Admitting: Emergency Medicine

## 2024-04-13 ENCOUNTER — Emergency Department (HOSPITAL_COMMUNITY)

## 2024-04-13 ENCOUNTER — Other Ambulatory Visit: Payer: Self-pay

## 2024-04-13 ENCOUNTER — Encounter (HOSPITAL_COMMUNITY): Payer: Self-pay

## 2024-04-13 DIAGNOSIS — G44309 Post-traumatic headache, unspecified, not intractable: Secondary | ICD-10-CM

## 2024-04-13 DIAGNOSIS — R519 Headache, unspecified: Secondary | ICD-10-CM | POA: Insufficient documentation

## 2024-04-13 LAB — CBC WITH DIFFERENTIAL/PLATELET
Abs Immature Granulocytes: 0.03 K/uL (ref 0.00–0.07)
Basophils Absolute: 0.1 K/uL (ref 0.0–0.1)
Basophils Relative: 2 %
Eosinophils Absolute: 0 K/uL (ref 0.0–0.5)
Eosinophils Relative: 1 %
HCT: 37.2 % (ref 36.0–46.0)
Hemoglobin: 12 g/dL (ref 12.0–15.0)
Immature Granulocytes: 1 %
Lymphocytes Relative: 27 %
Lymphs Abs: 0.9 K/uL (ref 0.7–4.0)
MCH: 31.1 pg (ref 26.0–34.0)
MCHC: 32.3 g/dL (ref 30.0–36.0)
MCV: 96.4 fL (ref 80.0–100.0)
Monocytes Absolute: 0.4 K/uL (ref 0.1–1.0)
Monocytes Relative: 11 %
Neutro Abs: 2 K/uL (ref 1.7–7.7)
Neutrophils Relative %: 58 %
Platelets: 163 K/uL (ref 150–400)
RBC: 3.86 MIL/uL — ABNORMAL LOW (ref 3.87–5.11)
RDW: 16.9 % — ABNORMAL HIGH (ref 11.5–15.5)
WBC: 3.4 K/uL — ABNORMAL LOW (ref 4.0–10.5)
nRBC: 0 % (ref 0.0–0.2)

## 2024-04-13 LAB — BASIC METABOLIC PANEL WITH GFR
Anion gap: 16 — ABNORMAL HIGH (ref 5–15)
BUN: 44 mg/dL — ABNORMAL HIGH (ref 8–23)
CO2: 27 mmol/L (ref 22–32)
Calcium: 9.2 mg/dL (ref 8.9–10.3)
Chloride: 89 mmol/L — ABNORMAL LOW (ref 98–111)
Creatinine, Ser: 8.35 mg/dL — ABNORMAL HIGH (ref 0.44–1.00)
GFR, Estimated: 5 mL/min — ABNORMAL LOW (ref >60)
Glucose, Bld: 95 mg/dL (ref 70–99)
Potassium: 4.5 mmol/L (ref 3.5–5.1)
Sodium: 132 mmol/L — ABNORMAL LOW (ref 135–145)

## 2024-04-13 MED ORDER — ACETAMINOPHEN 325 MG PO TABS
650.0000 mg | ORAL_TABLET | Freq: Once | ORAL | Status: AC
  Administered 2024-04-13: 650 mg via ORAL
  Filled 2024-04-13: qty 2

## 2024-04-13 MED ORDER — METOCLOPRAMIDE HCL 5 MG/ML IJ SOLN
5.0000 mg | Freq: Once | INTRAMUSCULAR | Status: AC
  Administered 2024-04-13: 5 mg via INTRAVENOUS
  Filled 2024-04-13: qty 2

## 2024-04-13 NOTE — ED Triage Notes (Signed)
 Patient BIB [facility] from home c/o of headache, 9/10, that started at 0000 this morning. Pain radiates across forehead describes it as dull & aching pain. Pt took 500mg  tylenol  about 4 hours ago. Pt had neck surgery 2 weeks ago and is c/o of neck pain.  Dialysis MWF  [facility] 148/82  74 HR  18 RR  94 CBG

## 2024-04-13 NOTE — ED Notes (Signed)
 Patient back from CT

## 2024-04-13 NOTE — ED Provider Notes (Signed)
 [place] EMERGENCY DEPARTMENT AT [facility] Provider Note   CSN: [id number] Arrival date & time: 04/13/24  0444     Patient presents with: Headache   Gloria Gallagher is a 73 y.o. female.   Patient presents to the emergency department for evaluation of headache.  Patient did have a fall on November 29, was seen in the ED.  She reports that she has had a dull headache, mostly on the right side since the fall.  It has been responding to Tylenol .  Tonight she was awakened from sleep around midnight with diffuse pain across the front of her head.  She did take Tylenol  and it did not help.  No vision change.  No fever.       Prior to Admission medications   Medication Sig Start Date End Date Taking? Authorizing Provider  acetaminophen  (TYLENOL ) 500 MG tablet Take 2 tablets (1,000 mg total) by mouth every 8 (eight) hours as needed for headache, fever, mild pain (pain score 1-3) or moderate pain (pain score 4-6). 03/28/24   Hongalgi, Anand D, MD  amiodarone  (PACERONE ) 200 MG tablet TAKE 1 TABLET BY MOUTH DAILY 11/14/23   Nahser, Aleene PARAS, MD  apixaban  (ELIQUIS ) 5 MG TABS tablet TAKE 1 TABLET BY MOUTH TWICE  DAILY 11/14/23   Nahser, Aleene PARAS, MD  atorvastatin  (LIPITOR) 20 MG tablet Take 20 mg by mouth every evening. 12/05/21   [provider]  cilostazol  (PLETAL ) 50 MG tablet Take 1 tablet (50 mg total) by mouth 2 (two) times daily. 07/02/23   Gerome Maurilio HERO, PA-C  cinacalcet  (SENSIPAR ) 90 MG tablet Take 90 mg by mouth every evening.    [provider]  cyclobenzaprine  (FLEXERIL ) 5 MG tablet Take 1 tablet (5 mg total) by mouth at bedtime. 04/07/24   Bari Charmaine FALCON, MD  Doxercalciferol  (HECTOROL  IV) Inject 4 mcg into the vein every Monday, Wednesday, and Friday with hemodialysis. Received at and provided by dialysis clinic.    [provider]  ferric citrate  (AURYXIA ) 1 GM 210 MG(Fe) tablet Take 1 tablet (210 mg total) by mouth 3 (three) times daily with  meals. Patient taking differently: Take 210-420 mg by mouth See admin instructions. Take 2 tablets (420mg ) by mouth three times daily with meals and take 1 tablet (210mg ) with snacks. 01/19/22   Bryan Bianchi, MD  multivitamin (RENA-VIT) TABS tablet Take 1 tablet by mouth at bedtime. Patient taking differently: Take 1 tablet by mouth daily. 10/05/22   Bryan Bianchi, MD  ondansetron  (ZOFRAN -ODT) 4 MG disintegrating tablet Take 1 tablet (4 mg total) by mouth every 8 (eight) hours as needed for nausea or vomiting. 03/28/24   Hongalgi, Trenda BIRCH, MD    Allergies: Penicillins and Shrimp (diagnostic)    Review of Systems  Updated Vital Signs BP (!) 156/82 (BP Location: Right Arm)   Pulse 74   Temp 98.6 F (37 C) (Oral)   Resp 18   Ht 5' 2 (1.575 m)   Wt 66.2 kg   SpO2 100%   BMI 26.70 kg/m   Physical Exam Vitals and nursing note reviewed.  Constitutional:      General: She is not in acute distress.    Appearance: She is well-developed.  HENT:     Head: Normocephalic and atraumatic.     Mouth/Throat:     Mouth: Mucous membranes are moist.  Eyes:     General: Vision grossly intact. Gaze aligned appropriately.     Extraocular Movements: Extraocular movements intact.  Conjunctiva/sclera: Conjunctivae normal.  Cardiovascular:     Rate and Rhythm: Normal rate and regular rhythm.     Pulses: Normal pulses.     Heart sounds: Normal heart sounds, S1 normal and S2 normal. No murmur heard.    No friction rub. No gallop.  Pulmonary:     Effort: Pulmonary effort is normal. No respiratory distress.     Breath sounds: Normal breath sounds.  Abdominal:     General: Bowel sounds are normal.     Palpations: Abdomen is soft.     Tenderness: There is no abdominal tenderness. There is no guarding or rebound.     Hernia: No hernia is present.  Musculoskeletal:        General: No swelling.     Cervical back: Full passive range of motion without pain, normal range of motion and neck supple. No  spinous process tenderness or muscular tenderness. Normal range of motion.     Right lower leg: No edema.     Left lower leg: No edema.  Skin:    General: Skin is warm and dry.     Capillary Refill: Capillary refill takes less than 2 seconds.     Findings: No ecchymosis, erythema, rash or wound.  Neurological:     General: No focal deficit present.     Mental Status: She is alert and oriented to person, place, and time.     GCS: GCS eye subscore is 4. GCS verbal subscore is 5. GCS motor subscore is 6.     Cranial Nerves: Cranial nerves 2-12 are intact.     Sensory: Sensation is intact.     Motor: Motor function is intact.     Coordination: Coordination is intact.  Psychiatric:        Attention and Perception: Attention normal.        Mood and Affect: Mood normal.        Speech: Speech normal.        Behavior: Behavior normal.     (all labs ordered are listed, but only abnormal results are displayed) Labs Reviewed  CBC WITH DIFFERENTIAL/PLATELET - Abnormal; Notable for the following components:      Result Value   WBC 3.4 (*)    RBC 3.86 (*)    RDW 16.9 (*)    All other components within normal limits  BASIC METABOLIC PANEL WITH GFR - Abnormal; Notable for the following components:   Sodium 132 (*)    Chloride 89 (*)    BUN 44 (*)    Creatinine, Ser 8.35 (*)    GFR, Estimated 5 (*)    Anion gap 16 (*)    All other components within normal limits    EKG: None  Radiology: CT HEAD WO CONTRAST ( ) Result Date: 04/13/2024 EXAM: CT HEAD WITHOUT CONTRAST 04/13/2024 05:39:00 AM TECHNIQUE: CT of the head was performed without the administration of intravenous contrast. Automated exposure control, iterative reconstruction, and/or weight based adjustment of the mA/kV was utilized to reduce the radiation dose to as low as reasonably achievable. COMPARISON: None available. CLINICAL HISTORY: Headache, increasing frequency or severity. FINDINGS: BRAIN AND VENTRICLES: No acute  hemorrhage. No evidence of acute infarct. Hypoattenuating foci in the cerebral white matter, most likely representing chronic small vessel disease. Prominence of the sulci and ventricles compatible with brain atrophy. No extra-axial collection. No mass effect or midline shift. ORBITS: No acute abnormality. SINUSES: No acute abnormality. SOFT TISSUES AND SKULL: The calvarium is intact. No acute soft tissue  abnormality. Changes post-surgical changes at C1-C2 with bilateral posterior fusion hardware and chronic type 2 odontoid process fracture. Partial opacification of the right mastoid air cells. IMPRESSION: 1. No acute intracranial abnormality. 2. Hypoattenuating foci in the cerebral white matter, most likely representing chronic small vessel disease. 3. Prominence of the sulci and ventricles compatible with brain atrophy. Electronically signed by: Waddell Calk MD 04/13/2024 05:48 AM EST RP Workstation: [id number]     Procedures   Medications Ordered in the ED  metoCLOPramide (REGLAN) injection 5 mg (5 mg Intravenous Given 04/13/24 0611)  acetaminophen  (TYLENOL ) tablet 650 mg (650 mg Oral Given 04/13/24 9388)                                    Medical Decision Making Amount and/or Complexity of Data Reviewed External Data Reviewed: radiology and notes. Labs: ordered. Decision-making details documented in ED Course. Radiology: ordered and independent interpretation performed. Decision-making details documented in ED Course.  Risk OTC drugs. Prescription drug management.   Differential Diagnosis considered includes, but not limited to: Hypertensive emergencies, Idiopathic intracranial hypertension, Space occupying lesions (tumors, abscesses, cysts), Acute hydrocephalus, Dural sinus thrombosis, Intracranial hemorrhage, Cerebrovascular accident or stroke, Meningitis and encephalitis, Migraine HA, Tension HA.  Presents to the emergency department for evaluation of headache.  Patient had a fall 2  days ago and has had persistent headache since then.  Overnight the headache transformed to a more global, worst headache that does not respond to Tylenol .  Patient with a normal, nonfocal neurologic exam at arrival.  Because patient had a recent fall and is on Eliquis , CT scan was repeated to evaluate for delayed bleeding.  There is no intracranial abnormality.  Patient continues to do well here in the emergency department.  Treated with Tylenol , Reglan.  Upon recheck she reports that her headache is better.     Final diagnoses:  Bad headache    ED Discharge Orders     None          Haze Lonni PARAS, MD 04/13/24 [phone]

## 2024-05-18 ENCOUNTER — Other Ambulatory Visit: Payer: Self-pay

## 2024-05-18 DIAGNOSIS — I739 Peripheral vascular disease, unspecified: Secondary | ICD-10-CM

## 2024-05-18 DIAGNOSIS — I4819 Other persistent atrial fibrillation: Secondary | ICD-10-CM

## 2024-05-20 MED ORDER — APIXABAN 5 MG PO TABS: 5.0000 mg | ORAL_TABLET | Freq: Two times a day (BID) | ORAL | 1 refills | Status: AC

## 2024-05-20 MED ORDER — AMIODARONE HCL 200 MG PO TABS: 200.0000 mg | ORAL_TABLET | Freq: Every day | ORAL | 0 refills | Status: AC

## 2024-05-20 MED ORDER — ATORVASTATIN CALCIUM 20 MG PO TABS: 20.0000 mg | ORAL_TABLET | Freq: Every evening | ORAL | 0 refills | Status: AC

## 2024-05-20 NOTE — Telephone Encounter (Signed)
 Former Pt of Dr. Calhoun. This RX was not prescribed or refilled by Cardiology. Please advise on if this RX should be refilled.

## 2024-06-02 ENCOUNTER — Emergency Department (HOSPITAL_COMMUNITY)
Admission: EM | Admit: 2024-06-02 | Discharge: 2024-06-02 | Disposition: A | Discharge status: Home / Self Care | Attending: Emergency Medicine | Admitting: Emergency Medicine

## 2024-06-02 ENCOUNTER — Emergency Department (HOSPITAL_COMMUNITY)

## 2024-06-02 ENCOUNTER — Other Ambulatory Visit: Payer: Self-pay

## 2024-06-02 DIAGNOSIS — I120 Hypertensive chronic kidney disease with stage 5 chronic kidney disease or end stage renal disease: Secondary | ICD-10-CM | POA: Diagnosis not present

## 2024-06-02 DIAGNOSIS — S32592A Other specified fracture of left pubis, initial encounter for closed fracture: Secondary | ICD-10-CM

## 2024-06-02 DIAGNOSIS — M542 Cervicalgia: Secondary | ICD-10-CM | POA: Insufficient documentation

## 2024-06-02 DIAGNOSIS — N186 End stage renal disease: Secondary | ICD-10-CM | POA: Diagnosis not present

## 2024-06-02 DIAGNOSIS — Z951 Presence of aortocoronary bypass graft: Secondary | ICD-10-CM | POA: Insufficient documentation

## 2024-06-02 DIAGNOSIS — S0990XA Unspecified injury of head, initial encounter: Secondary | ICD-10-CM | POA: Insufficient documentation

## 2024-06-02 DIAGNOSIS — W19XXXA Unspecified fall, initial encounter: Secondary | ICD-10-CM

## 2024-06-02 DIAGNOSIS — Z8616 Personal history of COVID-19: Secondary | ICD-10-CM | POA: Diagnosis not present

## 2024-06-02 DIAGNOSIS — I4891 Unspecified atrial fibrillation: Secondary | ICD-10-CM | POA: Diagnosis not present

## 2024-06-02 DIAGNOSIS — W01190A Fall on same level from slipping, tripping and stumbling with subsequent striking against furniture, initial encounter: Secondary | ICD-10-CM | POA: Insufficient documentation

## 2024-06-02 DIAGNOSIS — Z7901 Long term (current) use of anticoagulants: Secondary | ICD-10-CM | POA: Insufficient documentation

## 2024-06-02 DIAGNOSIS — Z79899 Other long term (current) drug therapy: Secondary | ICD-10-CM | POA: Insufficient documentation

## 2024-06-02 DIAGNOSIS — Y92019 Unspecified place in single-family (private) house as the place of occurrence of the external cause: Secondary | ICD-10-CM | POA: Insufficient documentation

## 2024-06-02 DIAGNOSIS — Y9301 Activity, walking, marching and hiking: Secondary | ICD-10-CM | POA: Insufficient documentation

## 2024-06-02 DIAGNOSIS — Z992 Dependence on renal dialysis: Secondary | ICD-10-CM | POA: Diagnosis not present

## 2024-06-02 DIAGNOSIS — R5383 Other fatigue: Secondary | ICD-10-CM | POA: Insufficient documentation

## 2024-06-02 DIAGNOSIS — F17210 Nicotine dependence, cigarettes, uncomplicated: Secondary | ICD-10-CM | POA: Insufficient documentation

## 2024-06-02 DIAGNOSIS — I2510 Atherosclerotic heart disease of native coronary artery without angina pectoris: Secondary | ICD-10-CM | POA: Insufficient documentation

## 2024-06-02 DIAGNOSIS — R600 Localized edema: Secondary | ICD-10-CM | POA: Insufficient documentation

## 2024-06-02 LAB — COMPREHENSIVE METABOLIC PANEL WITH GFR
ALT: 10 U/L (ref 0–44)
AST: 27 U/L (ref 15–41)
Albumin: 3.9 g/dL (ref 3.5–5.0)
Alkaline Phosphatase: 145 U/L — ABNORMAL HIGH (ref 38–126)
Anion gap: 18 — ABNORMAL HIGH (ref 5–15)
BUN: 29 mg/dL — ABNORMAL HIGH (ref 8–23)
CO2: 27 mmol/L (ref 22–32)
Calcium: 10.3 mg/dL (ref 8.9–10.3)
Chloride: 90 mmol/L — ABNORMAL LOW (ref 98–111)
Creatinine, Ser: 5.79 mg/dL — ABNORMAL HIGH (ref 0.44–1.00)
GFR, Estimated: 7 mL/min — ABNORMAL LOW
Glucose, Bld: 85 mg/dL (ref 70–99)
Potassium: 3.7 mmol/L (ref 3.5–5.1)
Sodium: 135 mmol/L (ref 135–145)
Total Bilirubin: 0.4 mg/dL (ref 0.0–1.2)
Total Protein: 7.2 g/dL (ref 6.5–8.1)

## 2024-06-02 LAB — CBC WITH DIFFERENTIAL/PLATELET
Abs Immature Granulocytes: 0.02 K/uL (ref 0.00–0.07)
Basophils Absolute: 0 K/uL (ref 0.0–0.1)
Basophils Relative: 1 %
Eosinophils Absolute: 0 K/uL (ref 0.0–0.5)
Eosinophils Relative: 0 %
HCT: 43.2 % (ref 36.0–46.0)
Hemoglobin: 14.1 g/dL (ref 12.0–15.0)
Immature Granulocytes: 1 %
Lymphocytes Relative: 21 %
Lymphs Abs: 0.7 K/uL (ref 0.7–4.0)
MCH: 31.8 pg (ref 26.0–34.0)
MCHC: 32.6 g/dL (ref 30.0–36.0)
MCV: 97.5 fL (ref 80.0–100.0)
Monocytes Absolute: 0.3 K/uL (ref 0.1–1.0)
Monocytes Relative: 10 %
Neutro Abs: 2.4 K/uL (ref 1.7–7.7)
Neutrophils Relative %: 67 %
Platelets: 135 K/uL — ABNORMAL LOW (ref 150–400)
RBC: 4.43 MIL/uL (ref 3.87–5.11)
RDW: 16.3 % — ABNORMAL HIGH (ref 11.5–15.5)
WBC: 3.5 K/uL — ABNORMAL LOW (ref 4.0–10.5)
nRBC: 0 % (ref 0.0–0.2)

## 2024-06-02 NOTE — ED Notes (Signed)
 Trauma Event Note    0800 -- EMS collar removed, [place] J collar placed. Transported to CT  0820 -- Continues to c/o neck pain and collar not comfortable - pt does tend to lean head to the left.  Talked with Dr. Patsey - will try an aspen collar.    Last imported Vital Signs BP (!) 150/64 (BP Location: Right Arm)   Pulse 70   Temp 97.6 F (36.4 C) (Oral)   Resp 18   Ht 5' 4 (1.626 m)   Wt 145 lb (65.8 kg)   SpO2 99%   BMI 24.89 kg/m   Trending CBC Recent Labs    06/02/24 0557  WBC 3.5*  HGB 14.1  HCT 43.2  PLT 135*    Trending Coag's No results for input(s): APTT, INR in the last 72 hours.  Trending BMET Recent Labs    06/02/24 0557  NA 135  K 3.7  CL 90*  CO2 27  BUN 29*  CREATININE 5.79*  GLUCOSE 85      Deeric Cruise M Tannya Gonet  Trauma Response RN  Please call TRN at [phone] for further assistance.

## 2024-06-02 NOTE — Consult Note (Signed)
 "  Providing Compassionate, Quality Care - Together   Reason for Consult: Neck pain Referring Physician: Dr. Patsey Nena DELENA Gloria Gallagher is an 74 y.o. female.  HPI: Gloria Gallagher presented to the [facility] Emergency Department following a ground level fall at home. She is on Eliquis . She lives with her sister. 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 She underwent a C1-2 posterior cervical fusion with Dr. Louis on 03/23/2024. She reports neck pain and balance issues. She rates her current pain level a 9/10.  Past Medical History:  Diagnosis Date   Acute hypoxic respiratory failure (HCC) 10/01/2022   Acute respiratory failure (HCC) 10/01/2022   Anemia    Aortic atherosclerosis    Arthritis    Atrial fibrillation (HCC)    CAD (coronary artery disease)    COVID 2021   mild   ESRD on hemodialysis (HCC)    MWF at [facility]   GERD (gastroesophageal reflux disease)    Gout    Heart murmur    Hyperparathyroidism    Hypertension    in the past, no longer takes medications   Neuromuscular disorder (HCC)    neuropathy   Pneumonia    Seasonal allergies     Past Surgical History:  Procedure Laterality Date   ABDOMINAL AORTOGRAM N/A 08/22/2023   Procedure: ABDOMINAL AORTOGRAM;  Surgeon: Gretta Lonni PARAS, MD;  Location: [facility] INVASIVE CV LAB;  Service: Cardiovascular;  Laterality: N/A;   ABDOMINAL AORTOGRAM W/LOWER EXTREMITY N/A 12/13/2022   Procedure: ABDOMINAL AORTOGRAM W/LOWER EXTREMITY;  Surgeon: Gretta Lonni PARAS, MD;  Location: [facility] INVASIVE CV LAB;  Service: Cardiovascular;  Laterality: N/A;   CARDIOVERSION N/A 05/16/2021   Procedure: CARDIOVERSION;  Surgeon: Alveta Aleene PARAS, MD;  Location: [facility] ENDOSCOPY;  Service: Cardiovascular;  Laterality: N/A;   CARDIOVERSION N/A 08/01/2021   Procedure: CARDIOVERSION;  Surgeon: Francyne Headland, MD;  Location: [facility] ENDOSCOPY;  Service: Cardiovascular;  Laterality: N/A;   CARDIOVERSION  N/A 12/05/2021   Procedure: CARDIOVERSION;  Surgeon: Sheena Pugh, DO;  Location: [facility] ENDOSCOPY;  Service: Cardiovascular;  Laterality: N/A;   DIALYSIS FISTULA CREATION Left    FEMORAL-POPLITEAL BYPASS GRAFT Left 09/25/2023   Procedure: BYPASS GRAFT FEMORAL-POPLITEAL ARTERY;  Surgeon: Magda Debby SAILOR, MD;  Location: [facility] OR;  Service: Vascular;  Laterality: Left;   LOWER EXTREMITY ANGIOGRAPHY N/A 08/22/2023   Procedure: Lower Extremity Angiography;  Surgeon: Gretta Lonni PARAS, MD;  Location: [facility] INVASIVE CV LAB;  Service: Cardiovascular;  Laterality: N/A;   LOWER EXTREMITY INTERVENTION N/A 08/22/2023   Procedure: LOWER EXTREMITY INTERVENTION;  Surgeon: Gretta Lonni PARAS, MD;  Location: [facility] INVAS[facility] CV LAB;  Service: Cardiovascular;  Laterality: N/A;   PERICARDIOCENTESIS N/A 10/02/2022   Procedure: PERICARDIOCENTESIS;  Surgeon: Verlin Lonni BIRCH, MD;  Location: [facility] INVASIVE CV LAB;  Service: Cardiovascular;  Laterality: N/A;   PERIPHERAL INTRAVASCULAR LITHOTRIPSY  08/22/2023   Procedure: PERIPHERAL INTRAVASCULAR LITHOTRIPSY;  Surgeon: Gretta Lonni PARAS, MD;  Location: [facility] INVAS[facility] CV LAB;  Service: Cardiovascular;;   POSTERIOR CERVICAL FUSION/FORAMINOTOMY N/A 03/23/2024   Procedure: POSTERIOR CERVICAL FUSION/FORAMINOTOMY CERVICAL ONE-TWO;  Surgeon: Louis Shove, MD;  Location: [facility] OR;  Service: Neurosurgery;  Laterality: N/A;  C1-2 POSTERIOR CERVICAL FUSION   REVISON OF ARTERIOVENOUS FISTULA Left 02/16/2022   Procedure: LEFT ARM ARTERIOVENOUS FISTULA REVISION;  Surgeon: Magda Debby SAILOR, MD;  Location: [facility] OR;  Service: Vascular;  Laterality: Left;  PERIPHERAL NERVE BLOCK   TUBAL LIGATION      Family History  Problem Relation Age of Onset   Diabetes Sister    Stomach cancer Neg Hx    Colon cancer Neg Hx     Social History:  reports that she has quit smoking. Her smoking use included cigarettes. She started smoking about 22 years ago. She has never been exposed to tobacco smoke. She has  never used smokeless tobacco. She reports that she does not currently use alcohol after a past usage of about 1.0 standard drink of alcohol per week. She reports that she does not use drugs.  Allergies: Allergies[1]  Medications: I have reviewed the patient's current medications.  Results for orders placed or performed during the hospital encounter of 06/02/24 (from the past 48 hours)  CBC with Differential/Platelet     Status: Abnormal   Collection Time: 06/02/24  5:57 AM  Result Value Ref Range   WBC 3.5 (L) 4.0 - 10.5 K/uL   RBC 4.43 3.87 - 5.11 MIL/uL   Hemoglobin 14.1 12.0 - 15.0 g/dL   HCT 56.7 63.9 - 53.9 %   MCV 97.5 80.0 - 100.0 fL   MCH 31.8 26.0 - 34.0 pg   MCHC 32.6 30.0 - 36.0 g/dL   RDW 83.6 (H) 88.4 - 84.4 %   Platelets 135 (L) 150 - 400 K/uL    Comment: REPEATED TO VERIFY   nRBC 0.0 0.0 - 0.2 %   Neutrophils Relative % 67 %   Neutro Abs 2.4 1.7 - 7.7 K/uL   Lymphocytes Relative 21 %   Lymphs Abs 0.7 0.7 - 4.0 K/uL   Monocytes Relative 10 %   Monocytes Absolute 0.3 0.1 - 1.0 K/uL   Eosinophils Relative 0 %   Eosinophils Absolute 0.0 0.0 - 0.5 K/uL   Basophils Relative 1 %   Basophils Absolute 0.0 0.0 - 0.1 K/uL   Immature Granulocytes 1 %   Abs Immature Granulocytes 0.02 0.00 - 0.07 K/uL    Comment: Performed at [facility], [address] [place], [place] [address]  Comprehensive metabolic panel with GFR     Status: Abnormal   Collection Time: 06/02/24  5:57 AM  Result Value Ref Range   Sodium 135 135 - 145 mmol/L   Potassium 3.7 3.5 - 5.1 mmol/L   Chloride 90 (L) 98 - 111 mmol/L   CO2 27 22 - 32 mmol/L   Glucose, Bld 85 70 - 99 mg/dL    Comment: Glucose reference range applies only to samples taken after fasting for at least 8 hours.   BUN 29 (H) 8 - 23 mg/dL   Creatinine, Ser 4.20 (H) 0.44 - 1.00 mg/dL   Calcium  10.3 8.9 - 10.3 mg/dL   Total Protein 7.2 6.5 - 8.1 g/dL   Albumin  3.9 3.5 - 5.0 g/dL   AST 27 15 - 41 U/L    Comment: HEMOLYSIS AT  THIS LEVEL MAY AFFECT RESULT   ALT 10 0 - 44 U/L   Alkaline Phosphatase 145 (H) 38 - 126 U/L   Total Bilirubin 0.4 0.0 - 1.2 mg/dL   GFR, Estimated 7 (L) >60 mL/min    Comment: (NOTE) Calculated using the CKD-EPI Creatinine Equation (2021)    Anion gap 18 (H) 5 - 15    Comment: Performed at [facility], [address] [place], [place] [address]    CT PELVIS WO CONTRAST Result Date: 06/02/2024 CLINICAL DATA:  Fall.  Possible fracture. EXAM: CT PELVIS WITHOUT CONTRAST TECHNIQUE: Multidetector CT imaging of the pelvis was performed  following the standard protocol without intravenous contrast. RADIATION DOSE REDUCTION: This exam was performed according to the departmental dose-optimization program which includes automated exposure control, adjustment of the mA and/or kV according to patient size and/or use of iterative reconstruction technique. COMPARISON:  CT 03/16/2024, plain films pelvis 06/02/2024. FINDINGS: Urinary Tract: Known stable autosomal dominant polycystic kidney disease. Bowel: Mild diverticulosis over the sigmoid colon. Mild fecal retention over the rectum. Vascular/Lymphatic: Moderate calcified plaque over the distal abdominal aorta and iliac arteries. No significant adenopathy. Reproductive: Multiple calcified fibroids over the uterus unchanged. Adnexal regions unremarkable. Other: Findings suggesting mild cholelithiasis versus sludge. Multiple liver cysts unchanged. Musculoskeletal: Acute to subacute minimally displaced fracture along the anterior aspect of the left acetabulum to the junction of the superior pubic ramus. This fracture is new since the prior exam. Minimally displaced and minimally comminuted subacute fracture of the left inferior pubic ramus. No additional fractures visualized. Minimal stable loss of height of L5. Small Schmorl's node over the superior endplate of L4. IMPRESSION: 1. Acute to subacute minimally displaced fracture along the anterior aspect of the left  acetabulum to the junction of the superior pubic ramus. Minimally displaced and minimally comminuted subacute fracture of the left inferior pubic ramus. 2. Known stable autosomal dominant polycystic kidney disease. 3. Mild sigmoid diverticulosis. 4. Multiple calcified uterine fibroids unchanged. 5. Findings suggesting mild cholelithiasis versus sludge. 6. Aortic atherosclerosis. Aortic Atherosclerosis (ICD10-I70.0). Electronically Signed   By: Toribio Agreste M.D.   On: 06/02/2024 08:30   CT CERVICAL SPINE WO CONTRAST Result Date: 06/02/2024 EXAM: CT CERVICAL SPINE WITHOUT CONTRAST 06/02/2024 06:17:22 AM TECHNIQUE: CT of the cervical spine was performed without the administration of intravenous contrast. Multiplanar reformatted images are provided for review. Automated exposure control, iterative reconstruction, and/or weight based adjustment of the mA/kV was utilized to reduce the radiation dose to as low as reasonably achievable. COMPARISON: CT head 06/02/2024. Prior cervical spine CT 04/11/2024. CLINICAL HISTORY: 74 year old female. Neck trauma (Age >= 65y). Fall at home. Struck head on table. FINDINGS: BONES AND ALIGNMENT: Type 2 odontoid fracture with progressive anterior displacement relative to the C2 body since 04/11/2024 (series 14 image 31). Unhealed odontoid fragment is now anteriorly angulated and appears more impacted along the anterior body of C2. Associated anterior positioning of the C1 ring relative to the body of C2 also, and the left side C1 portacal screw is outside of bone (coronal image 23), as seen previously. There is also little purchase of the left side C2 cortical screw (coronal image 30) stable. Possible loosening along the right C2 cortical screw also. Posterior C1 and C2 elements cerclage wires and bone graft are redemonstrated with no arthrodesis. Craniocervical alignment of the occipital condyles and C1 appears stable. Despite these findings, no definite progression C2 level spinal  canal narrowing by CT. Cervical lordosis elsewhere is maintained. No new osseous abnormality identified in the cervical spine. DEGENERATIVE CHANGES: Stable. SOFT TISSUES: No prevertebral soft tissue swelling. Tortuous and retropharyngeal cervical carotid arteries with bulky calcified atherosclerosis again noted. Tortuous great vessels with calcified atherosclerosis and emphysema again noted in the visible upper chest. IMPRESSION: 1. Non-healed type 2 odontoid fracture with Progressive anterior displacement since November. And underlying suboptimal C1-C2 hardware fusion (especially on the left) with possible right C2 screw loosening, no developing arthrodesis. Recommend Neurosurgery consultation or follow-up. 2. No new traumatic injury is identified in the cervical spine. 3. Emphysema (ICD10-J43.9).   Carotid atheroscerosis. Electronically signed by: Helayne Hurst MD 06/02/2024 07:46 AM EST RP Workstation: [id number]  DG Chest Port 1 View Result Date: 06/02/2024 EXAM: 1 VIEW(S) XRAY OF THE CHEST 06/02/2024 06:10:00 AM COMPARISON: 04/11/2024 CLINICAL HISTORY: 74 year old female status post fall at home striking table. FINDINGS: LINES, TUBES AND DEVICES: Stable central venous port. LUNGS AND PLEURA: Hypoinflation. Centrilobular emphysema demonstrated by CT last year. Stable lung base hypoventilation. No acute opacity or pneumothorax. HEART AND MEDIASTINUM: Stable pronounced cardiomegaly. Aortic arch atherosclerosis. BONES AND SOFT TISSUES: Degenerative changes at the left coracoid. Stable calcification of the liver parenchyma subjacent to the right hemidiaphragm. IMPRESSION: 1. Stable chronic cardiomegaly and lung base hypoventilation. Underlying Emphysema (ICD10-J43.9). Electronically signed by: Helayne Hurst MD 06/02/2024 06:36 AM EST RP Workstation: [id number]   DG Pelvis Portable Result Date: 06/02/2024 EXAM: 1 OR 2 VIEW(S) XRAY OF THE PELVIS 06/02/2024 06:11:00 AM COMPARISON: 03/16/2024 CLINICAL HISTORY:  74 year old female with a fall at home, struck head on table. FINDINGS: BONES AND JOINTS: Mildly displaced fractures of the left superior and inferior pubic rami. The inferior ramus fracture was visible in November. Both fractures have a similar radiographic appearance now. No acute osseous abnormality. SOFT TISSUES: Atherosclerotic calcifications present. Calcified uterine fibroids overlie the pelvis. Severe iliofemoral calcified atherosclerosis. Acute uterine fibroid dystrophic calcifications. IMPRESSION: 1. Increased displacement of left inferior pubic ramus fracture seen in November. Similar displaced fracture of the superior pubic ramus at its function with the left acetabulum, not apparent in November but might be nonacute. 2. No other acute fracture or dislocation identified about the pelvis. Electronically signed by: Helayne Hurst MD 06/02/2024 06:33 AM EST RP Workstation: [id number]   CT HEAD WO CONTRAST ( ) Result Date: 06/02/2024 EXAM: CT HEAD WITHOUT CONTRAST 06/02/2024 06:17:22 AM TECHNIQUE: CT of the head was performed without the administration of intravenous contrast. Automated exposure control, iterative reconstruction, and/or weight based adjustment of the mA/kV was utilized to reduce the radiation dose to as low as reasonably achievable. COMPARISON: CT head 04/13/2024. CLINICAL HISTORY: 74 year old female. Fall at home. Struck head on table. FINDINGS: BRAIN AND VENTRICLES: No acute hemorrhage. No evidence of acute infarct. No hydrocephalus. No extra-axial collection. No mass effect or midline shift. Stable brain volume. Patchy and confluent bilateral cerebral white matter hypodensity, deep white matter capsule involvement, chronic heterogeneity in the bilateral deep gray nuclei appears stable and most compatible with chronic small vessel disease. No suspicious intracranial vascular hyperdensity. ORBITS: No acute abnormality. SINUSES: Paranasal sinuses, middle ears and mastoids remain well  aerated. SOFT TISSUES AND SKULL: No acute soft tissue abnormality. No skull fracture. Advanced calcified atherosclerosis at the skull base. Partially visible cervical fusion sequelae, reported separately today. IMPRESSION: 1. No acute traumatic injury identified to the head. 2. Moderate for age chronic small vessel disease, stable. Electronically signed by: Helayne Hurst MD 06/02/2024 06:27 AM EST RP Workstation: [id number]    Review of Systems  Constitutional:  Positive for malaise/fatigue.  HENT: Negative.    Eyes: Negative.   Respiratory: Negative.    Cardiovascular: Negative.   Musculoskeletal:  Positive for falls and neck pain.  Skin: Negative.   Neurological:  Positive for weakness.  Psychiatric/Behavioral: Negative.     Blood pressure (!) 143/71, pulse 80, temperature (!) 97.5 F (36.4 C), temperature source Oral, resp. rate 16, height 5' 4 (1.626 m), weight 65.8 kg, SpO2 98%. Estimated body mass index is 24.89 kg/m as calculated from the following:   Height as of this encounter: 5' 4 (1.626 m).   Weight as of this encounter: 65.8 kg.  Physical Exam Constitutional:      Appearance: She  is cachectic. She is ill-appearing.     Interventions: Cervical collar in place.  HENT:     Head: Normocephalic and atraumatic.     Nose: Nose normal.     Mouth/Throat:     Mouth: Mucous membranes are dry.  Eyes:     Pupils: Pupils are equal, round, and reactive to light.  Neck:     Comments: Patient keeps her head angled to the left. Cardiovascular:     Rate and Rhythm: Normal rate.  Pulmonary:     Effort: Pulmonary effort is normal.  Abdominal:     Palpations: Abdomen is soft.  Musculoskeletal:     Cervical back: Tenderness present. Decreased range of motion.  Skin:    General: Skin is dry.  Neurological:     Mental Status: She is oriented to person, place, and time. She is lethargic.     Assessment/Plan: Patient does not appear to be healing following C1-2 posterior cervical  fusion. Recommend hard cervical collar at all times. Patient should work with therapies prior to discharge to ensure she is able to mobilize independently. She might require admission for pain control and evaluation by therapies.  I am in communication with my attending and they agree with the plan for this patient.   Gerard Beck, DNP, AGNP-C Nurse Practitioner  [facility] [address], [place], [place] [address] P: [phone]    F: [phone]  06/02/2024, 9:07 AM       [1]  Allergies Allergen Reactions   Penicillins Hives and Itching   Shrimp (Diagnostic) Hives   "

## 2024-06-02 NOTE — ED Triage Notes (Signed)
 Pt BIB EMS from home. EMS reports pt had mechanical fall when she was walking to bed, pt reports balance issues since neck surgery a couple months ago. Pt hit forehead on bedside table, no obvious injuries, but pt endorses more pain than usual in neck. No LOC. Pt takes eliquis . A&Ox4  MWF dialysis, last session yesterday.  Pt wearing c collar on arrival.   148/70, HR 70, cbg 100, 98% RA

## 2024-06-02 NOTE — Progress Notes (Signed)
 Orthopedic Tech Progress Note Patient Details:  Gloria Gallagher April 02, 1951 [id number]  Delivered [employer] to room  Patient ID: Gloria Gallagher, female   DOB: [birth year], 74 y.o.   MRN: [id number]  Gloria Gallagher Pac 06/02/2024, 8:39 AM

## 2024-06-02 NOTE — ED Provider Notes (Signed)
 " [facility] AT [facility] Provider Note   CSN: [id number] Arrival date & time: 06/02/24  9451     Patient presents with: [place]   Gloria Gallagher is a 74 y.o. female.   Presents to the emergency department for evaluation after a fall.  Patient brought as a level 2 trauma because she does take Eliquis .  Patient reports that she was trying to walk to her bed, lost her balance and fell.  She hit her head on a bedside table and fell to the ground.  Patient reports neck pain after the fall.  She does have a history of chronic neck pain and prior cervical spine surgery.       Prior to Admission medications  Medication Sig Start Date End Date Taking? Authorizing Provider  acetaminophen  (TYLENOL ) 500 MG tablet Take 2 tablets (1,000 mg total) by mouth every 8 (eight) hours as needed for headache, fever, mild pain (pain score 1-3) or moderate pain (pain score 4-6). 03/28/24   Hongalgi, Anand D, MD  amiodarone  (PACERONE ) 200 MG tablet Take 1 tablet (200 mg total) by mouth daily. 05/20/24   Lelon Hamilton T, PA-C  apixaban  (ELIQUIS ) 5 MG TABS tablet Take 1 tablet (5 mg total) by mouth 2 (two) times daily. 05/20/24   Lelon Hamilton T, PA-C  atorvastatin  (LIPITOR) 20 MG tablet Take 1 tablet (20 mg total) by mouth every evening. 05/20/24   Lelon Hamilton T, PA-C  cilostazol  (PLETAL ) 50 MG tablet Take 1 tablet (50 mg total) by mouth 2 (two) times daily. 07/02/23   Gerome Maurilio HERO, PA-C  cinacalcet  (SENSIPAR ) 90 MG tablet Take 90 mg by mouth every evening.    [provider]  cyclobenzaprine  (FLEXERIL ) 5 MG tablet Take 1 tablet (5 mg total) by mouth at bedtime. 04/07/24   Bari Charmaine FALCON, MD  Doxercalciferol  (HECTOROL  IV) Inject 4 mcg into the vein every Monday, Wednesday, and Friday with hemodialysis. Received at and provided by dialysis clinic.    [provider]  ferric citrate  (AURYXIA ) 1 GM 210 MG(Fe) tablet Take 1 tablet (210 mg total) by mouth 3 (three)  times daily with meals. Patient taking differently: Take 210-420 mg by mouth See admin instructions. Take 2 tablets (420mg ) by mouth three times daily with meals and take 1 tablet (210mg ) with snacks. 01/19/22   Bryan Bianchi, MD  multivitamin (RENA-VIT) TABS tablet Take 1 tablet by mouth at bedtime. Patient taking differently: Take 1 tablet by mouth daily. 10/05/22   Bryan Bianchi, MD  ondansetron  (ZOFRAN -ODT) 4 MG disintegrating tablet Take 1 tablet (4 mg total) by mouth every 8 (eight) hours as needed for nausea or vomiting. 03/28/24   Hongalgi, Trenda BIRCH, MD    Allergies: Penicillins and Shrimp (diagnostic)    Review of Systems  Updated Vital Signs BP (!) 150/64 (BP Location: Right Arm)   Pulse 70   Temp 97.6 F (36.4 C) (Oral)   Resp 18   Ht 5' 4 (1.626 m)   Wt 65.8 kg   SpO2 100%   BMI 24.89 kg/m   Physical Exam Vitals and nursing note reviewed.  Constitutional:      General: She is not in acute distress.    Appearance: She is well-developed.  HENT:     Head: Normocephalic and atraumatic.     Mouth/Throat:     Mouth: Mucous membranes are moist.  Eyes:     General: Vision grossly intact. Gaze aligned appropriately.     Extraocular  Movements: Extraocular movements intact.     Conjunctiva/sclera: Conjunctivae normal.  Cardiovascular:     Rate and Rhythm: Normal rate and regular rhythm.     Pulses: Normal pulses.     Heart sounds: Normal heart sounds, S1 normal and S2 normal. No murmur heard.    No friction rub. No gallop.  Pulmonary:     Effort: Pulmonary effort is normal. No respiratory distress.     Breath sounds: Normal breath sounds.  Abdominal:     General: Bowel sounds are normal.     Palpations: Abdomen is soft.     Tenderness: There is no abdominal tenderness. There is no guarding or rebound.     Hernia: No hernia is present.  Musculoskeletal:        General: No swelling.     Cervical back: Full passive range of motion without pain, normal range of motion  and neck supple. No spinous process tenderness or muscular tenderness. Normal range of motion.     Right lower leg: Edema present.     Left lower leg: Edema present.  Skin:    General: Skin is warm and dry.     Capillary Refill: Capillary refill takes less than 2 seconds.     Findings: No ecchymosis, erythema, rash or wound.  Neurological:     General: No focal deficit present.     Mental Status: She is alert and oriented to person, place, and time.     GCS: GCS eye subscore is 4. GCS verbal subscore is 5. GCS motor subscore is 6.     Cranial Nerves: Cranial nerves 2-12 are intact.     Sensory: Sensation is intact.     Motor: Motor function is intact.     Coordination: Coordination is intact.  Psychiatric:        Attention and Perception: Attention normal.        Mood and Affect: Mood normal.        Speech: Speech normal.        Behavior: Behavior normal.     (all labs ordered are listed, but only abnormal results are displayed) Labs Reviewed  CBC WITH DIFFERENTIAL/PLATELET - Abnormal; Notable for the following components:      Result Value   WBC 3.5 (*)    RDW 16.3 (*)    Platelets 135 (*)    All other components within normal limits  COMPREHENSIVE METABOLIC PANEL WITH GFR - Abnormal; Notable for the following components:   Chloride 90 (*)    BUN 29 (*)    Creatinine, Ser 5.79 (*)    Alkaline Phosphatase 145 (*)    GFR, Estimated 7 (*)    Anion gap 18 (*)    All other components within normal limits  URINALYSIS, ROUTINE W REFLEX MICROSCOPIC    EKG: EKG Interpretation Date/Time:  Tuesday June 02 2024 05:57:04 EST Ventricular Rate:  72 PR Interval:    QRS Duration:  106 QT Interval:  456 QTC Calculation: 500 R Axis:   -71  Text Interpretation: Atrial fibrillation Left anterior fascicular block Probable anterior infarct, age indeterminate Baseline wander in lead(s) II No acute changes Confirmed by Haze Lonni PARAS [phone]) on 06/02/2024 5:58:55  AM  Radiology: [facility] Chest Port 1 View Result Date: 06/02/2024 EXAM: 1 VIEW(S) XRAY OF THE CHEST 06/02/2024 06:10:00 AM COMPARISON: 04/11/2024 CLINICAL HISTORY: 74 year old female status post fall at home striking table. FINDINGS: LINES, TUBES AND DEVICES: Stable central venous port. LUNGS AND PLEURA: Hypoinflation. Centrilobular emphysema demonstrated by  CT last year. Stable lung base hypoventilation. No acute opacity or pneumothorax. HEART AND MEDIASTINUM: Stable pronounced cardiomegaly. Aortic arch atherosclerosis. BONES AND SOFT TISSUES: Degenerative changes at the left coracoid. Stable calcification of the liver parenchyma subjacent to the right hemidiaphragm. IMPRESSION: 1. Stable chronic cardiomegaly and lung base hypoventilation. Underlying Emphysema (ICD10-J43.9). Electronically signed by: Helayne Hurst MD 06/02/2024 06:36 AM EST RP Workstation: [id number]   DG Pelvis Portable Result Date: 06/02/2024 EXAM: 1 OR 2 VIEW(S) XRAY OF THE PELVIS 06/02/2024 06:11:00 AM COMPARISON: 03/16/2024 CLINICAL HISTORY: 74 year old female with a fall at home, struck head on table. FINDINGS: BONES AND JOINTS: Mildly displaced fractures of the left superior and inferior pubic rami. The inferior ramus fracture was visible in November. Both fractures have a similar radiographic appearance now. No acute osseous abnormality. SOFT TISSUES: Atherosclerotic calcifications present. Calcified uterine fibroids overlie the pelvis. Severe iliofemoral calcified atherosclerosis. Acute uterine fibroid dystrophic calcifications. IMPRESSION: 1. Increased displacement of left inferior pubic ramus fracture seen in November. Similar displaced fracture of the superior pubic ramus at its function with the left acetabulum, not apparent in November but might be nonacute. 2. No other acute fracture or dislocation identified about the pelvis. Electronically signed by: Helayne Hurst MD 06/02/2024 06:33 AM EST RP Workstation: [id number]   CT HEAD WO  CONTRAST ( ) Result Date: 06/02/2024 EXAM: CT HEAD WITHOUT CONTRAST 06/02/2024 06:17:22 AM TECHNIQUE: CT of the head was performed without the administration of intravenous contrast. Automated exposure control, iterative reconstruction, and/or weight based adjustment of the mA/kV was utilized to reduce the radiation dose to as low as reasonably achievable. COMPARISON: CT head 04/13/2024. CLINICAL HISTORY: 74 year old female. Fall at home. Struck head on table. FINDINGS: BRAIN AND VENTRICLES: No acute hemorrhage. No evidence of acute infarct. No hydrocephalus. No extra-axial collection. No mass effect or midline shift. Stable brain volume. Patchy and confluent bilateral cerebral white matter hypodensity, deep white matter capsule involvement, chronic heterogeneity in the bilateral deep gray nuclei appears stable and most compatible with chronic small vessel disease. No suspicious intracranial vascular hyperdensity. ORBITS: No acute abnormality. SINUSES: Paranasal sinuses, middle ears and mastoids remain well aerated. SOFT TISSUES AND SKULL: No acute soft tissue abnormality. No skull fracture. Advanced calcified atherosclerosis at the skull base. Partially visible cervical fusion sequelae, reported separately today. IMPRESSION: 1. No acute traumatic injury identified to the head. 2. Moderate for age chronic small vessel disease, stable. Electronically signed by: Helayne Hurst MD 06/02/2024 06:27 AM EST RP Workstation: [id number]     Procedures   Medications Ordered in the ED - No data to display                                  Medical Decision Making Amount and/or Complexity of Data Reviewed External Data Reviewed: ECG and notes. Labs: ordered. Decision-making details documented in ED Course. Radiology: ordered and independent interpretation performed. Decision-making details documented in ED Course. ECG/medicine tests: ordered and independent interpretation performed. Decision-making details  documented in ED Course.   Differential diagnosis considered includes, but not limited to: Blunt trauma including intracranial injury, spinal injury, thoracic injury, intra-abdominal and retroperitoneal injury, orthopedic injury  Presents to the emergency department for evaluation of neck pain.  Patient reports that she fell, hit her head on a bedside table while trying to get to her bed.  Patient reports cervical surgery several months ago, has had increased pain since the fall tonight.  No focal weakness, numbness  or tingling of extremities.  Patient arrives in a cervical collar.  She will receive CT head, cervical spine to further evaluate.  Chest x-ray performed on arrival without acute pathology.  Pelvis x-ray with left superior and inferior pubic rami fractures, likely chronic.  Will sign out to oncoming ER physician to further evaluate after her imaging of head and neck performed.     Final diagnoses:  Fall, initial encounter  Acute neck pain    ED Discharge Orders     None          Briannia Laba, Lonni PARAS, MD 06/02/24 [phone]  "

## 2024-06-02 NOTE — ED Notes (Signed)
 Pt placed on cardiac monitoring, CCMD notified.

## 2024-06-02 NOTE — Progress Notes (Signed)
 Orthopedic Tech Progress Note Patient Details:  Gloria Gallagher June 23, 1950 [id number]  Patient ID: [name], female   DOB: [birth year], 74 y.o.   MRN: [id number] I attended trauma page. Chandra Dorn PARAS 06/02/2024, 5:56 AM

## 2024-06-02 NOTE — Discharge Instructions (Addendum)
 Weight-bear as tolerated on the pelvis.  Wear the cervical collar at all times.  Follow-up with Dr. Louis.

## 2024-06-02 NOTE — ED Notes (Signed)
Orthopedic surgery at bedside

## 2024-06-02 NOTE — ED Provider Notes (Signed)
" °  Physical Exam  BP (!) 143/71 (BP Location: Right Arm)   Pulse 80   Temp (!) 97.5 F (36.4 C) (Oral)   Resp 16   Ht 5' 4 (1.626 m)   Wt 65.8 kg   SpO2 98%   BMI 24.89 kg/m   Physical Exam  Procedures  Procedures  ED Course / MDM    Medical Decision Making Amount and/or Complexity of Data Reviewed Labs: ordered. Radiology: ordered.   CT scan of pelvis done and does show potentially new or subacute fractures of the pelvis.  Seen by orthopedic surgery and follow-up with Dr. Celena in 3 weeks.  Weight-bear as tolerated.  Also potentially worsening of cervical spine injury.  Seen by neurosurgery that has recently done the surgery.  Recommends wearing cervical collar at all times and following up in the office.  Will discharge home       Patsey Lot, MD 06/02/24 1028  "

## 2024-06-02 NOTE — ED Notes (Signed)
 Pt was given discharge instructions and verbalized understanding. Pt states she will not be leaving collar on as recommended because it is uncomfortable. Nurse explained the importance of collar staying on until follow up. Pt place in wheelchair and taken to lobby to wait for daughters arrival.

## 2024-06-02 NOTE — Consult Note (Signed)
 Reason for Consult:Pelvic fxs Referring Physician: [place] Time called: [id number] Time at bedside: 0937   Gloria Gallagher is an 74 y.o. female.  HPI: Clint was walking to get back to bed when she lost her balance and fell. She hit her head on the way down. She was brought to the ED as a level 2 trauma activation 2/2 her taking Eliquis . Workup showed left pubic rami fxs in addition to other injuries and orthopedic surgery was consulted. She lives at home with her sister and uses a RW to ambulate.  Past Medical History:  Diagnosis Date   Acute hypoxic respiratory failure (HCC) 10/01/2022   Acute respiratory failure (HCC) 10/01/2022   Anemia    Aortic atherosclerosis    Arthritis    Atrial fibrillation (HCC)    CAD (coronary artery disease)    COVID 2021   mild   ESRD on hemodialysis (HCC)    MWF at [facility]   GERD (gastroesophageal reflux disease)    Gout    Heart murmur    Hyperparathyroidism    Hypertension    in the past, no longer takes medications   Neuromuscular disorder (HCC)    neuropathy   Pneumonia    Seasonal allergies     Past Surgical History:  Procedure Laterality Date   ABDOMINAL AORTOGRAM N/A 08/22/2023   Procedure: ABDOMINAL AORTOGRAM;  Surgeon: Gretta Lonni PARAS, MD;  Location: [facility];  Service: Cardiovascular;  Laterality: N/A;   ABDOMINAL AORTOGRAM W/LOWER EXTREMITY N/A 12/13/2022   Procedure: ABDOMINAL AORTOGRAM W/LOWER EXTREMITY;  Surgeon: Gretta Lonni PARAS, MD;  Location: [facility];  Service: Cardiovascular;  Laterality: N/A;   CARDIOVERSION N/A 05/16/2021   Procedure: CARDIOVERSION;  Surgeon: Alveta Aleene PARAS, MD;  Location: [facility] ENDOSCOPY;  Service: Cardiovascular;  Laterality: N/A;   CARDIOVERSION N/A 08/01/2021   Procedure: CARDIOVERSION;  Surgeon: Francyne Headland, MD;  Location: [facility] ENDOSCOPY;  Service: Cardiovascular;  Laterality: N/A;   CARDIOVERSION N/A 12/05/2021   Procedure: CARDIOVERSION;  Surgeon: Sheena Pugh,  DO;  Location: [facility] ENDOSCOPY;  Service: Cardiovascular;  Laterality: N/A;   DIALYSIS FISTULA CREATION Left    FEMORAL-POPLITEAL BYPASS GRAFT Left 09/25/2023   Procedure: BYPASS GRAFT FEMORAL-POPLITEAL ARTERY;  Surgeon: Magda Debby SAILOR, MD;  Location: [facility] OR;  Service: Vascular;  Laterality: Left;   LOWER EXTREMITY ANGIOGRAPHY N/A 08/22/2023   Procedure: Lower Extremity Angiography;  Surgeon: Gretta Lonni PARAS, MD;  Location: [facility] INVASIVE CV LAB;  Service: Cardiovascular;  Laterality: N/A;   LOWER EXTREMITY INTERVENTION N/A 08/22/2023   Procedure: LOWER EXTREMITY INTERVENTION;  Surgeon: Gretta Lonni PARAS, MD;  Location: [facility];  Service: Cardiovascular;  Laterality: N/A;   PERICARDIOCENTESIS N/A 10/02/2022   Procedure: PERICARDIOCENTESIS;  Surgeon: Verlin Lonni BIRCH, MD;  Location: [facility];  Service: Cardiovascular;  Laterality: N/A;   PERIPHERAL INTRAVASCULAR LITHOTRIPSY  08/22/2023   Procedure: PERIPHERAL INTRAVASCULAR LITHOTRIPSY;  Surgeon: Gretta Lonni PARAS, MD;  Location: [facility];  Service: Cardiovascular;;   POSTERIOR CERVICAL FUSION/FORAMINOTOMY N/A 03/23/2024   Procedure: POSTERIOR CERVICAL FUSION/FORAMINOTOMY CERVICAL ONE-TWO;  Surgeon: Louis Shove, MD;  Location: [facility] OR;  Service: Neurosurgery;  Laterality: N/A;  C1-2 POSTERIOR CERVICAL FUSION   REVISON OF ARTERIOVENOUS FISTULA Left 02/16/2022   Procedure: LEFT ARM ARTERIOVENOUS FISTULA REVISION;  Surgeon: Magda Debby SAILOR, MD;  Location: [facility] OR;  Service: Vascular;  Laterality: Left;  PERIPHERAL NERVE BLOCK   TUBAL LIGATION      Family History  Problem Relation Age  of Onset   Diabetes Sister    Stomach cancer Neg Hx    Colon cancer Neg Hx     Social History:  reports that she has quit smoking. Her smoking use included cigarettes. She started smoking about 22 years ago. She has never been exposed to tobacco smoke. She has never used smokeless tobacco. She reports that she does not currently  use alcohol after a past usage of about 1.0 standard drink of alcohol per week. She reports that she does not use drugs.  Allergies: Allergies[1]  Medications: I have reviewed the patient's current medications.  Results for orders placed or performed during the hospital encounter of 06/02/24 (from the past 48 hours)  CBC with Differential/Platelet     Status: Abnormal   Collection Time: 06/02/24  5:57 AM  Result Value Ref Range   WBC 3.5 (L) 4.0 - 10.5 K/uL   RBC 4.43 3.87 - 5.11 MIL/uL   Hemoglobin 14.1 12.0 - 15.0 g/dL   HCT 56.7 63.9 - 53.9 %   MCV 97.5 80.0 - 100.0 fL   MCH 31.8 26.0 - 34.0 pg   MCHC 32.6 30.0 - 36.0 g/dL   RDW 83.6 (H) 88.4 - 84.4 %   Platelets 135 (L) 150 - 400 K/uL    Comment: REPEATED TO VERIFY   nRBC 0.0 0.0 - 0.2 %   Neutrophils Relative % 67 %   Neutro Abs 2.4 1.7 - 7.7 K/uL   Lymphocytes Relative 21 %   Lymphs Abs 0.7 0.7 - 4.0 K/uL   Monocytes Relative 10 %   Monocytes Absolute 0.3 0.1 - 1.0 K/uL   Eosinophils Relative 0 %   Eosinophils Absolute 0.0 0.0 - 0.5 K/uL   Basophils Relative 1 %   Basophils Absolute 0.0 0.0 - 0.1 K/uL   Immature Granulocytes 1 %   Abs Immature Granulocytes 0.02 0.00 - 0.07 K/uL    Comment: Performed at [facility], [address] [place], [place] [address]  Comprehensive metabolic panel with GFR     Status: Abnormal   Collection Time: 06/02/24  5:57 AM  Result Value Ref Range   Sodium 135 135 - 145 mmol/L   Potassium 3.7 3.5 - 5.1 mmol/L   Chloride 90 (L) 98 - 111 mmol/L   CO2 27 22 - 32 mmol/L   Glucose, Bld 85 70 - 99 mg/dL    Comment: Glucose reference range applies only to samples taken after fasting for at least 8 hours.   BUN 29 (H) 8 - 23 mg/dL   Creatinine, Ser 4.20 (H) 0.44 - 1.00 mg/dL   Calcium  10.3 8.9 - 10.3 mg/dL   Total Protein 7.2 6.5 - 8.1 g/dL   Albumin  3.9 3.5 - 5.0 g/dL   AST 27 15 - 41 U/L    Comment: HEMOLYSIS AT THIS LEVEL MAY AFFECT RESULT   ALT 10 0 - 44 U/L   Alkaline  Phosphatase 145 (H) 38 - 126 U/L   Total Bilirubin 0.4 0.0 - 1.2 mg/dL   GFR, Estimated 7 (L) >60 mL/min    Comment: (NOTE) Calculated using the CKD-EPI Creatinine Equation (2021)    Anion gap 18 (H) 5 - 15    Comment: Performed at [facility], [address] [place], [place] [address]    CT PELVIS WO CONTRAST Result Date: 06/02/2024 CLINICAL DATA:  Fall.  Possible fracture. EXAM: CT PELVIS WITHOUT CONTRAST TECHNIQUE: Multidetector CT imaging of the pelvis was performed following the standard  protocol without intravenous contrast. RADIATION DOSE REDUCTION: This exam was performed according to the departmental dose-optimization program which includes automated exposure control, adjustment of the mA and/or kV according to patient size and/or use of iterative reconstruction technique. COMPARISON:  CT 03/16/2024, plain films pelvis 06/02/2024. FINDINGS: Urinary Tract: Known stable autosomal dominant polycystic kidney disease. Bowel: Mild diverticulosis over the sigmoid colon. Mild fecal retention over the rectum. Vascular/Lymphatic: Moderate calcified plaque over the distal abdominal aorta and iliac arteries. No significant adenopathy. Reproductive: Multiple calcified fibroids over the uterus unchanged. Adnexal regions unremarkable. Other: Findings suggesting mild cholelithiasis versus sludge. Multiple liver cysts unchanged. Musculoskeletal: Acute to subacute minimally displaced fracture along the anterior aspect of the left acetabulum to the junction of the superior pubic ramus. This fracture is new since the prior exam. Minimally displaced and minimally comminuted subacute fracture of the left inferior pubic ramus. No additional fractures visualized. Minimal stable loss of height of L5. Small Schmorl's node over the superior endplate of L4. IMPRESSION: 1. Acute to subacute minimally displaced fracture along the anterior aspect of the left acetabulum to the junction of the superior pubic ramus.  Minimally displaced and minimally comminuted subacute fracture of the left inferior pubic ramus. 2. Known stable autosomal dominant polycystic kidney disease. 3. Mild sigmoid diverticulosis. 4. Multiple calcified uterine fibroids unchanged. 5. Findings suggesting mild cholelithiasis versus sludge. 6. Aortic atherosclerosis. Aortic Atherosclerosis (ICD10-I70.0). Electronically Signed   By: Toribio Agreste M.D.   On: 06/02/2024 08:30   CT CERVICAL SPINE WO CONTRAST Result Date: 06/02/2024 EXAM: CT CERVICAL SPINE WITHOUT CONTRAST 06/02/2024 06:17:22 AM TECHNIQUE: CT of the cervical spine was performed without the administration of intravenous contrast. Multiplanar reformatted images are provided for review. Automated exposure control, iterative reconstruction, and/or weight based adjustment of the mA/kV was utilized to reduce the radiation dose to as low as reasonably achievable. COMPARISON: CT head 06/02/2024. Prior cervical spine CT 04/11/2024. CLINICAL HISTORY: 74 year old female. Neck trauma (Age >= 65y). Fall at home. Struck head on table. FINDINGS: BONES AND ALIGNMENT: Type 2 odontoid fracture with progressive anterior displacement relative to the C2 body since 04/11/2024 (series 14 image 31). Unhealed odontoid fragment is now anteriorly angulated and appears more impacted along the anterior body of C2. Associated anterior positioning of the C1 ring relative to the body of C2 also, and the left side C1 portacal screw is outside of bone (coronal image 23), as seen previously. There is also little purchase of the left side C2 cortical screw (coronal image 30) stable. Possible loosening along the right C2 cortical screw also. Posterior C1 and C2 elements cerclage wires and bone graft are redemonstrated with no arthrodesis. Craniocervical alignment of the occipital condyles and C1 appears stable. Despite these findings, no definite progression C2 level spinal canal narrowing by CT. Cervical lordosis elsewhere is  maintained. No new osseous abnormality identified in the cervical spine. DEGENERATIVE CHANGES: Stable. SOFT TISSUES: No prevertebral soft tissue swelling. Tortuous and retropharyngeal cervical carotid arteries with bulky calcified atherosclerosis again noted. Tortuous great vessels with calcified atherosclerosis and emphysema again noted in the visible upper chest. IMPRESSION: 1. Non-healed type 2 odontoid fracture with Progressive anterior displacement since November. And underlying suboptimal C1-C2 hardware fusion (especially on the left) with possible right C2 screw loosening, no developing arthrodesis. Recommend Neurosurgery consultation or follow-up. 2. No new traumatic injury is identified in the cervical spine. 3. Emphysema (ICD10-J43.9).   Carotid atheroscerosis. Electronically signed by: Helayne Hurst MD 06/02/2024 07:46 AM EST RP Workstation: [id number]   DG  Chest Port 1 View Result Date: 06/02/2024 EXAM: 1 VIEW(S) XRAY OF THE CHEST 06/02/2024 06:10:00 AM COMPARISON: 04/11/2024 CLINICAL HISTORY: 74 year old female status post fall at home striking table. FINDINGS: LINES, TUBES AND DEVICES: Stable central venous port. LUNGS AND PLEURA: Hypoinflation. Centrilobular emphysema demonstrated by CT last year. Stable lung base hypoventilation. No acute opacity or pneumothorax. HEART AND MEDIASTINUM: Stable pronounced cardiomegaly. Aortic arch atherosclerosis. BONES AND SOFT TISSUES: Degenerative changes at the left coracoid. Stable calcification of the liver parenchyma subjacent to the right hemidiaphragm. IMPRESSION: 1. Stable chronic cardiomegaly and lung base hypoventilation. Underlying Emphysema (ICD10-J43.9). Electronically signed by: Helayne Hurst MD 06/02/2024 06:36 AM EST RP Workstation: [id number]   DG Pelvis Portable Result Date: 06/02/2024 EXAM: 1 OR 2 VIEW(S) XRAY OF THE PELVIS 06/02/2024 06:11:00 AM COMPARISON: 03/16/2024 CLINICAL HISTORY: 74 year old female with a fall at home, struck head on  table. FINDINGS: BONES AND JOINTS: Mildly displaced fractures of the left superior and inferior pubic rami. The inferior ramus fracture was visible in November. Both fractures have a similar radiographic appearance now. No acute osseous abnormality. SOFT TISSUES: Atherosclerotic calcifications present. Calcified uterine fibroids overlie the pelvis. Severe iliofemoral calcified atherosclerosis. Acute uterine fibroid dystrophic calcifications. IMPRESSION: 1. Increased displacement of left inferior pubic ramus fracture seen in November. Similar displaced fracture of the superior pubic ramus at its function with the left acetabulum, not apparent in November but might be nonacute. 2. No other acute fracture or dislocation identified about the pelvis. Electronically signed by: Helayne Hurst MD 06/02/2024 06:33 AM EST RP Workstation: [id number]   CT HEAD WO CONTRAST ( ) Result Date: 06/02/2024 EXAM: CT HEAD WITHOUT CONTRAST 06/02/2024 06:17:22 AM TECHNIQUE: CT of the head was performed without the administration of intravenous contrast. Automated exposure control, iterative reconstruction, and/or weight based adjustment of the mA/kV was utilized to reduce the radiation dose to as low as reasonably achievable. COMPARISON: CT head 04/13/2024. CLINICAL HISTORY: 74 year old female. Fall at home. Struck head on table. FINDINGS: BRAIN AND VENTRICLES: No acute hemorrhage. No evidence of acute infarct. No hydrocephalus. No extra-axial collection. No mass effect or midline shift. Stable brain volume. Patchy and confluent bilateral cerebral white matter hypodensity, deep white matter capsule involvement, chronic heterogeneity in the bilateral deep gray nuclei appears stable and most compatible with chronic small vessel disease. No suspicious intracranial vascular hyperdensity. ORBITS: No acute abnormality. SINUSES: Paranasal sinuses, middle ears and mastoids remain well aerated. SOFT TISSUES AND SKULL: No acute soft tissue  abnormality. No skull fracture. Advanced calcified atherosclerosis at the skull base. Partially visible cervical fusion sequelae, reported separately today. IMPRESSION: 1. No acute traumatic injury identified to the head. 2. Moderate for age chronic small vessel disease, stable. Electronically signed by: Helayne Hurst MD 06/02/2024 06:27 AM EST RP Workstation: [id number]    Review of Systems  HENT:  Negative for ear discharge, ear pain, hearing loss and tinnitus.   Eyes:  Negative for photophobia and pain.  Respiratory:  Negative for cough and shortness of breath.   Cardiovascular:  Negative for chest pain.  Gastrointestinal:  Negative for abdominal pain, nausea and vomiting.  Genitourinary:  Negative for dysuria, flank pain, frequency and urgency.  Musculoskeletal:  Positive for neck pain. Negative for arthralgias, back pain and myalgias.  Neurological:  Negative for dizziness and headaches.  Hematological:  Does not bruise/bleed easily.  Psychiatric/Behavioral:  The patient is not nervous/anxious.    Blood pressure (!) 143/71, pulse 80, temperature (!) 97.5 F (36.4 C), temperature source Oral, resp. rate 16, height 5' 4 (  1.626 m), weight 65.8 kg, SpO2 98%. Physical Exam Constitutional:      General: She is not in acute distress.    Appearance: She is well-developed. She is not diaphoretic.  HENT:     Head: Normocephalic and atraumatic.  Eyes:     General: No scleral icterus.       Right eye: No discharge.        Left eye: No discharge.     Conjunctiva/sclera: Conjunctivae normal.  Neck:     Comments: C-collar Cardiovascular:     Rate and Rhythm: Normal rate and regular rhythm.  Pulmonary:     Effort: Pulmonary effort is normal. No respiratory distress.  Musculoskeletal:     Comments: Pelvis--no traumatic wounds or rash, no ecchymosis, stable to manual stress, mild pain on right with AP compression  BLE No traumatic wounds, ecchymosis, or rash  Nontender  No knee or ankle  effusion  Knee stable to varus/ valgus and anterior/posterior stress  Sens DPN, SPN, TN intact  Motor EHL, ext, flex, evers 5/5  No significant edema  Skin:    General: Skin is warm and dry.  Neurological:     Mental Status: She is alert.  Psychiatric:        Mood and Affect: Mood normal.        Behavior: Behavior normal.     Assessment/Plan: Left pubic rami fxs -- Plan non-operative treatment with [facility]. F/u with Dr. Celena in 3 weeks.    Ozell DOROTHA Ned, PA-C Orthopedic Surgery [phone] 06/02/2024, 9:40 AM     [1]  Allergies Allergen Reactions   Penicillins Hives and Itching   Shrimp (Diagnostic) Hives

## 2024-06-15 ENCOUNTER — Emergency Department (HOSPITAL_COMMUNITY)

## 2024-06-15 ENCOUNTER — Emergency Department (HOSPITAL_COMMUNITY)
Admission: EM | Admit: 2024-06-15 | Discharge: 2024-06-15 | Disposition: A | Discharge status: Home / Self Care | Attending: Emergency Medicine | Admitting: Emergency Medicine

## 2024-06-15 ENCOUNTER — Encounter (HOSPITAL_COMMUNITY): Payer: Self-pay

## 2024-06-15 DIAGNOSIS — S0990XA Unspecified injury of head, initial encounter: Secondary | ICD-10-CM | POA: Insufficient documentation

## 2024-06-15 DIAGNOSIS — W01198A Fall on same level from slipping, tripping and stumbling with subsequent striking against other object, initial encounter: Secondary | ICD-10-CM | POA: Insufficient documentation

## 2024-06-15 DIAGNOSIS — M542 Cervicalgia: Secondary | ICD-10-CM | POA: Insufficient documentation

## 2024-06-15 DIAGNOSIS — Z79899 Other long term (current) drug therapy: Secondary | ICD-10-CM | POA: Insufficient documentation

## 2024-06-15 DIAGNOSIS — Z8616 Personal history of COVID-19: Secondary | ICD-10-CM | POA: Insufficient documentation

## 2024-06-15 DIAGNOSIS — I120 Hypertensive chronic kidney disease with stage 5 chronic kidney disease or end stage renal disease: Secondary | ICD-10-CM | POA: Insufficient documentation

## 2024-06-15 DIAGNOSIS — Z951 Presence of aortocoronary bypass graft: Secondary | ICD-10-CM | POA: Insufficient documentation

## 2024-06-15 DIAGNOSIS — R778 Other specified abnormalities of plasma proteins: Secondary | ICD-10-CM | POA: Insufficient documentation

## 2024-06-15 DIAGNOSIS — Y9301 Activity, walking, marching and hiking: Secondary | ICD-10-CM | POA: Insufficient documentation

## 2024-06-15 DIAGNOSIS — R7989 Other specified abnormal findings of blood chemistry: Secondary | ICD-10-CM

## 2024-06-15 DIAGNOSIS — Z7901 Long term (current) use of anticoagulants: Secondary | ICD-10-CM | POA: Insufficient documentation

## 2024-06-15 DIAGNOSIS — M546 Pain in thoracic spine: Secondary | ICD-10-CM | POA: Insufficient documentation

## 2024-06-15 DIAGNOSIS — I4891 Unspecified atrial fibrillation: Secondary | ICD-10-CM | POA: Insufficient documentation

## 2024-06-15 DIAGNOSIS — Z992 Dependence on renal dialysis: Secondary | ICD-10-CM | POA: Insufficient documentation

## 2024-06-15 DIAGNOSIS — I2510 Atherosclerotic heart disease of native coronary artery without angina pectoris: Secondary | ICD-10-CM | POA: Insufficient documentation

## 2024-06-15 DIAGNOSIS — N186 End stage renal disease: Secondary | ICD-10-CM | POA: Insufficient documentation

## 2024-06-15 DIAGNOSIS — Z87891 Personal history of nicotine dependence: Secondary | ICD-10-CM | POA: Insufficient documentation

## 2024-06-15 DIAGNOSIS — W19XXXA Unspecified fall, initial encounter: Secondary | ICD-10-CM

## 2024-06-15 LAB — BASIC METABOLIC PANEL WITH GFR
Anion gap: 15 (ref 5–15)
BUN: 16 mg/dL (ref 8–23)
CO2: 30 mmol/L (ref 22–32)
Calcium: 9.1 mg/dL (ref 8.9–10.3)
Chloride: 92 mmol/L — ABNORMAL LOW (ref 98–111)
Creatinine, Ser: 3.7 mg/dL — ABNORMAL HIGH (ref 0.44–1.00)
GFR, Estimated: 12 mL/min — ABNORMAL LOW
Glucose, Bld: 83 mg/dL (ref 70–99)
Potassium: 3.8 mmol/L (ref 3.5–5.1)
Sodium: 136 mmol/L (ref 135–145)

## 2024-06-15 LAB — CBC WITH DIFFERENTIAL/PLATELET
Abs Immature Granulocytes: 0.03 10*3/uL (ref 0.00–0.07)
Basophils Absolute: 0 10*3/uL (ref 0.0–0.1)
Basophils Relative: 1 %
Eosinophils Absolute: 0 10*3/uL (ref 0.0–0.5)
Eosinophils Relative: 0 %
HCT: 41.7 % (ref 36.0–46.0)
Hemoglobin: 13.4 g/dL (ref 12.0–15.0)
Immature Granulocytes: 1 %
Lymphocytes Relative: 20 %
Lymphs Abs: 0.6 10*3/uL — ABNORMAL LOW (ref 0.7–4.0)
MCH: 31.7 pg (ref 26.0–34.0)
MCHC: 32.1 g/dL (ref 30.0–36.0)
MCV: 98.6 fL (ref 80.0–100.0)
Monocytes Absolute: 0.3 10*3/uL (ref 0.1–1.0)
Monocytes Relative: 9 %
Neutro Abs: 2.1 10*3/uL (ref 1.7–7.7)
Neutrophils Relative %: 69 %
Platelets: 149 10*3/uL — ABNORMAL LOW (ref 150–400)
RBC: 4.23 MIL/uL (ref 3.87–5.11)
RDW: 16.8 % — ABNORMAL HIGH (ref 11.5–15.5)
WBC: 3.1 10*3/uL — ABNORMAL LOW (ref 4.0–10.5)
nRBC: 0 % (ref 0.0–0.2)

## 2024-06-15 LAB — TROPONIN T, HIGH SENSITIVITY
Troponin T High Sensitivity: 103 ng/L (ref 0–19)
Troponin T High Sensitivity: 102 ng/L (ref 0–19)

## 2024-06-15 NOTE — Progress Notes (Signed)
 SPIRITUAL CARE AND COUNSELING CONSULT NOTE   VISIT SUMMARY Chaplain prayed with patient to help with relaxation and prepare her to move to xrays and other tests. Chaplain established relationship of support with her granddaughter who came to support her.   SPIRITUAL ENCOUNTER                                                                                                                                                                      Type of Visit: Initial Care provided to:: Patient, Family Conversation partners present during encounter: Nurse Referral source: Trauma page Reason for visit: Trauma OnCall Visit: No  INTERVENTIONS   Spiritual Care Interventions Made: Established relationship of care and support, Compassionate presence, Prayer    INTERVENTION OUTCOMES   Outcomes: Awareness of support   If immediate needs arise, please contact [facility] 24 hour on call [phone]   Tinya Cadogan  06/15/2024 4:40 PM

## 2024-06-15 NOTE — Progress Notes (Signed)
 Orthopedic Tech Progress Note Patient Details:  Gloria Gallagher 01/06/1951 [id number] Level 2 Trauma. Not needed Patient ID: Gloria Gallagher, female   DOB: Oct 02, 1950, 74 y.o.   MRN: [id number]  [name] Cos 06/15/2024, 4:25 PM

## 2024-08-25 ENCOUNTER — Ambulatory Visit (HOSPITAL_COMMUNITY)

## 2024-08-25 ENCOUNTER — Ambulatory Visit
